# Patient Record
Sex: Male | Born: 1945
Health system: Southern US, Community
[De-identification: ages and names within clinical notes are randomized; demographics above are authoritative.]

## PROBLEM LIST (undated history)

## (undated) DIAGNOSIS — G4733 Obstructive sleep apnea (adult) (pediatric): Secondary | ICD-10-CM

## (undated) DIAGNOSIS — I739 Peripheral vascular disease, unspecified: Secondary | ICD-10-CM

## (undated) DIAGNOSIS — E119 Type 2 diabetes mellitus without complications: Secondary | ICD-10-CM

## (undated) DIAGNOSIS — I2510 Atherosclerotic heart disease of native coronary artery without angina pectoris: Secondary | ICD-10-CM

## (undated) DIAGNOSIS — C801 Malignant (primary) neoplasm, unspecified: Secondary | ICD-10-CM

## (undated) DIAGNOSIS — I701 Atherosclerosis of renal artery: Secondary | ICD-10-CM

## (undated) DIAGNOSIS — I219 Acute myocardial infarction, unspecified: Secondary | ICD-10-CM

## (undated) DIAGNOSIS — Z87442 Personal history of urinary calculi: Secondary | ICD-10-CM

## (undated) DIAGNOSIS — R7303 Prediabetes: Secondary | ICD-10-CM

## (undated) DIAGNOSIS — I484 Atypical atrial flutter: Secondary | ICD-10-CM

## (undated) DIAGNOSIS — I4819 Other persistent atrial fibrillation: Secondary | ICD-10-CM

## (undated) DIAGNOSIS — I499 Cardiac arrhythmia, unspecified: Secondary | ICD-10-CM

## (undated) DIAGNOSIS — Z951 Presence of aortocoronary bypass graft: Secondary | ICD-10-CM

## (undated) DIAGNOSIS — I519 Heart disease, unspecified: Secondary | ICD-10-CM

## (undated) DIAGNOSIS — E785 Hyperlipidemia, unspecified: Secondary | ICD-10-CM

## (undated) DIAGNOSIS — I10 Essential (primary) hypertension: Secondary | ICD-10-CM

## (undated) DIAGNOSIS — J449 Chronic obstructive pulmonary disease, unspecified: Secondary | ICD-10-CM

## (undated) HISTORY — DX: Essential (primary) hypertension: I10

## (undated) HISTORY — DX: Obstructive sleep apnea (adult) (pediatric): G47.33

## (undated) HISTORY — PX: BACK SURGERY: SHX140

## (undated) HISTORY — DX: Hyperlipidemia, unspecified: E78.5

## (undated) HISTORY — DX: Atherosclerotic heart disease of native coronary artery without angina pectoris: I25.10

## (undated) HISTORY — DX: Atherosclerosis of renal artery: I70.1

## (undated) HISTORY — DX: Peripheral vascular disease, unspecified: I73.9

## (undated) HISTORY — DX: Atypical atrial flutter: I48.4

## (undated) HISTORY — DX: Presence of aortocoronary bypass graft: Z95.1

## (undated) HISTORY — DX: Other persistent atrial fibrillation: I48.19

## (undated) HISTORY — DX: Heart disease, unspecified: I51.9

## (undated) HISTORY — PX: ATRIAL FIBRILLATION ABLATION: SHX5732

---

## 1979-09-02 HISTORY — PX: ORCHIECTOMY: SHX2116

## 1996-09-01 HISTORY — PX: CORONARY ARTERY BYPASS GRAFT: SHX141

## 1996-11-16 DIAGNOSIS — Z951 Presence of aortocoronary bypass graft: Secondary | ICD-10-CM

## 1996-11-16 HISTORY — DX: Presence of aortocoronary bypass graft: Z95.1

## 1997-10-26 ENCOUNTER — Ambulatory Visit: Admission: RE | Admit: 1997-10-26 | Discharge: 1997-10-27 | Payer: Self-pay | Admitting: Cardiovascular Disease

## 1998-07-19 ENCOUNTER — Observation Stay (HOSPITAL_COMMUNITY): Admission: RE | Admit: 1998-07-19 | Discharge: 1998-07-20 | Payer: Self-pay | Admitting: Cardiovascular Disease

## 1998-07-19 ENCOUNTER — Encounter: Payer: Self-pay | Admitting: Cardiovascular Disease

## 1999-09-05 ENCOUNTER — Encounter: Payer: Self-pay | Admitting: Cardiovascular Disease

## 1999-09-05 ENCOUNTER — Observation Stay (HOSPITAL_COMMUNITY): Admission: RE | Admit: 1999-09-05 | Discharge: 1999-09-06 | Payer: Self-pay | Admitting: Cardiovascular Disease

## 2001-10-18 ENCOUNTER — Encounter: Payer: Self-pay | Admitting: Cardiovascular Disease

## 2001-10-18 ENCOUNTER — Ambulatory Visit (HOSPITAL_COMMUNITY): Admission: RE | Admit: 2001-10-18 | Discharge: 2001-10-18 | Payer: Self-pay | Admitting: Cardiovascular Disease

## 2002-01-20 ENCOUNTER — Ambulatory Visit (HOSPITAL_COMMUNITY): Admission: RE | Admit: 2002-01-20 | Discharge: 2002-01-20 | Payer: Self-pay | Admitting: Gastroenterology

## 2003-04-07 ENCOUNTER — Ambulatory Visit (HOSPITAL_COMMUNITY): Admission: RE | Admit: 2003-04-07 | Discharge: 2003-04-08 | Payer: Self-pay | Admitting: Orthopaedic Surgery

## 2005-09-01 HISTORY — PX: BACK SURGERY: SHX140

## 2005-10-03 ENCOUNTER — Encounter: Admission: RE | Admit: 2005-10-03 | Discharge: 2005-10-03 | Payer: Self-pay | Admitting: Orthopaedic Surgery

## 2005-10-24 ENCOUNTER — Encounter: Admission: RE | Admit: 2005-10-24 | Discharge: 2005-10-24 | Payer: Self-pay | Admitting: Orthopaedic Surgery

## 2005-12-22 ENCOUNTER — Ambulatory Visit (HOSPITAL_COMMUNITY): Admission: RE | Admit: 2005-12-22 | Discharge: 2005-12-23 | Payer: Self-pay | Admitting: Orthopaedic Surgery

## 2006-03-23 ENCOUNTER — Ambulatory Visit (HOSPITAL_COMMUNITY): Admission: RE | Admit: 2006-03-23 | Discharge: 2006-03-24 | Payer: Self-pay | Admitting: Cardiovascular Disease

## 2008-01-25 ENCOUNTER — Inpatient Hospital Stay (HOSPITAL_COMMUNITY): Admission: EM | Admit: 2008-01-25 | Discharge: 2008-01-27 | Payer: Self-pay | Admitting: Emergency Medicine

## 2008-01-27 ENCOUNTER — Encounter (INDEPENDENT_AMBULATORY_CARE_PROVIDER_SITE_OTHER): Payer: Self-pay | Admitting: Cardiovascular Disease

## 2008-03-08 ENCOUNTER — Ambulatory Visit (HOSPITAL_COMMUNITY): Admission: RE | Admit: 2008-03-08 | Discharge: 2008-03-08 | Payer: Self-pay | Admitting: Cardiovascular Disease

## 2008-09-18 ENCOUNTER — Encounter: Admission: RE | Admit: 2008-09-18 | Discharge: 2008-09-18 | Payer: Self-pay | Admitting: Cardiovascular Disease

## 2008-09-22 ENCOUNTER — Ambulatory Visit (HOSPITAL_COMMUNITY): Admission: RE | Admit: 2008-09-22 | Discharge: 2008-09-22 | Payer: Self-pay | Admitting: Cardiovascular Disease

## 2008-09-22 ENCOUNTER — Encounter: Payer: Self-pay | Admitting: Cardiovascular Disease

## 2009-11-21 HISTORY — PX: OTHER SURGICAL HISTORY: SHX169

## 2010-01-21 LAB — PSA: PSA: 0.9

## 2010-01-21 LAB — CBC AND DIFFERENTIAL: Hemoglobin: 10.6 g/dL — AB (ref 13.5–17.5)

## 2010-02-01 ENCOUNTER — Ambulatory Visit (HOSPITAL_COMMUNITY): Admission: RE | Admit: 2010-02-01 | Discharge: 2010-02-01 | Payer: Self-pay | Admitting: Cardiovascular Disease

## 2010-03-25 HISTORY — PX: OTHER SURGICAL HISTORY: SHX169

## 2010-04-18 LAB — BASIC METABOLIC PANEL
Creatinine: 1.2 mg/dL (ref 0.6–1.3)
Potassium: 3.9 mmol/L (ref 3.4–5.3)
Sodium: 142 mmol/L (ref 137–147)

## 2010-04-18 LAB — CBC AND DIFFERENTIAL: Hemoglobin: 9.4 g/dL — AB (ref 13.5–17.5)

## 2010-04-18 LAB — HEMOGLOBIN A1C: Hemoglobin A1C: 6.3

## 2010-04-18 LAB — FERRITIN: Hemoglobin A1C: 6.3

## 2010-04-18 LAB — IRON AND TIBC: Iron Bind.Cap.(Total): 420

## 2010-04-18 LAB — CHG IRON BINDING TEST: Ferritin: 19

## 2010-04-18 LAB — IRON

## 2010-07-18 LAB — LIPID PANEL
Cholesterol: 83 mg/dL (ref 0–200)
HDL: 34 mg/dL — AB (ref 35–70)
Triglycerides: 56 mg/dL (ref 40–160)

## 2010-11-18 LAB — GLUCOSE, CAPILLARY: Glucose-Capillary: 130 mg/dL — ABNORMAL HIGH (ref 70–99)

## 2010-11-18 LAB — PROTIME-INR
INR: 2.58 — ABNORMAL HIGH (ref 0.00–1.49)
Prothrombin Time: 27.5 seconds — ABNORMAL HIGH (ref 11.6–15.2)

## 2011-01-14 NOTE — H&P (Signed)
NAMEJAVARIAN, [name]               ACCOUNT NO.:  [id number]   MEDICAL RECORD NO.:  [id number]            PATIENT TYPE:   LOCATION:                                 FACILITY:   PHYSICIAN:  Richard A. Alanda Amass, M.D.DATE OF BIRTH:  04/14/1946   DATE OF ADMISSION:  01/25/2008  DATE OF DISCHARGE:                              HISTORY & PHYSICAL   CHIEF COMPLAINT:  Palpitations and throat discomfort and left arm  discomfort.   HISTORY OF PRESENT ILLNESS:  [name] is a 65 year old male who had  bypass surgery x7 in March of 1998.  He is followed by Dr. Allyson Sabal.  He  had a catheterization in 2003 that showed patent grafts.  His last  Myoview was in February of 2009 with low risk, although he did have some  suggestion of ischemia.  He also has peripheral vascular disease and has  had previous right renal artery stenting in 1999 and bilateral iliac  artery stenting in February of 1999 and July of 2007.  He had done well  from a cardiac standpoint.  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  He has not had atrial fibrillation or tachycardia in the past.  Yesterday he said he woke up at 2:00 a.m. from a dream and noted  shoulder discomfort, vague shortness of breath, palpitations,  tachycardia, and some throat discomfort.  He got up and sat down to  drink some water, and his symptoms improved but have not resolved.  He  was seen in the office today for further evaluation.  His EKG today in  the office shows atrial flutter with an overall ventricular response of  100.  He denies any recent exertional chest pain or angina.  As noted,  during his episode he did have some shoulder discomfort and throat pain.  He did take one nitroglycerin that he had, and his symptoms of throat  pain and left shoulder pain resolved.   PAST MEDICAL HISTORY:  1. Hypertension.  2. Treated dyslipidemia.  3. Peripheral vascular disease, as described above.  4. Prior right bundle branch block.   CURRENT  MEDICATIONS:  1. Lipitor 20 mg nightly.  2. Metoprolol 50 mg b.i.d.  3. Aspirin 325 mg a day.  4. Lisinopril/hydrochlorothiazide 12.5/10 mg a day.  5. Plavix 75 mg a day.   ALLERGIES:  NO KNOWN DRUG ALLERGIES.   SOCIAL HISTORY:  He is divorced.  He has two children and one  grandchild.  Recently he was laid off and forced into retirement from [employer].  He quit smoking in 1996.  He has 1-2 glasses of wine a  day.   FAMILY HISTORY:  Unremarkable for coronary disease.  His father did have  a bypass, but he was in his mid-70s.  He has a brother without coronary  disease.  His mother died in her 37s of AML.   REVIEW OF SYSTEMS:  Essentially unremarkable, except for noted above.  He has not had GI bleeding or melena.  He has no history of prostate  problems or dysuria or hematuria.  He  has no history of kidney stones.  He has no history of diabetes.  He has not had recent fever or chills.   PHYSICAL EXAMINATION:  VITAL SIGNS:  Blood pressure 126/96, pulse 104,  weight 224, respirations 12.  GENERAL:  He is well-developed, well-nourished male in no acute  distress.  HEENT:  Normocephalic, atraumatic.  Extraocular motions intact.  Sclerae  is anicteric.  Lids and conjunctivae within normal limits.  NECK:  Without JVD and without bruit.  CHEST:  Clear to auscultation and percussion.  CARDIAC:  Reveals regular rate and rhythm without obvious murmur or rub.  His rate is increased.  ABDOMEN:  Nontender.  No hepatosplenomegaly, no bruits.  EXTREMITIES:  Some rubor.  Posterior tibial pulses are faint.  Femoral  artery pulses are somewhat diminished.  There are no obvious bruits.  NEUROLOGIC:  Exam grossly intact.  He is awake, alert, oriented, and  cooperative.  He moves all extremities without obvious deficit.  SKIN:  Warm and dry.   EKG shows atrial flutter with 2:1 conduction and a right bundle branch  block.   IMPRESSION:  1. New onset 2:1 atrial flutter.  2. Throat  discomfort and arm pain, rule out progression of coronary      disease.  3. History of coronary artery bypass grafting in March of 1998 with      catheterization in 2003 revealing patent grafts, low-risk Myoview      in February of 2009.  4. Normal left ventricular function.  5. Peripheral vascular disease with history of right renal artery      stenting in 1999, bilateral iliac artery stenting in February of      1999 and again in July of 2007.  He does have some moderate disease      by Dopplers in February of 2009.  6. Treated hypertension.  7. Treated dyslipidemia.  8. Right bundle branch block.   PLAN:  The patient was seen by Dr. Alanda Amass and myself today in the  office.  He will be admitted to [facility], started on IV heparin and p.o.  diltiazem.  He will need to be restudied tomorrow for further treatment  of his atrial flutter.      Abelino Derrick, P.A.      Richard A. Alanda Amass, M.D.  Electronically Signed    LKK/MEDQ  D:  01/25/2008  T:  01/25/2008  Job:  [id number]

## 2011-01-14 NOTE — Cardiovascular Report (Signed)
[name], [name]              ACCOUNT NO.:  [id number]   MEDICAL RECORD NO.:  [id number]          PATIENT TYPE:  OIB   LOCATION:  2899                         FACILITY:  [facility]   PHYSICIAN:  Nanetta Batty, M.D.   DATE OF BIRTH:  [birth year]   DATE OF PROCEDURE:  03/08/2008  DATE OF DISCHARGE:  03/08/2008                            CARDIAC CATHETERIZATION   [name] is a 65 year old gentleman with history of CAD status post  coronary artery bypass grafting x7 on November 16, 1996.  He also had renal  artery PTA and stenting in 1999 and bilateral iliac arteries PTA and  stenting as well.  His other problems include  hypertension and  hyperlipidemia.  He has been in a flutter for the last several months  and has undergone 2-D echocardiography and Myoview stress testing all of  which were unremarkable.  He has been on Coumadin anticoagulation, who  presents now for elective outpatient DC cardioversion.   DESCRIPTION OF PROCEDURE:  The patient brought to the outpatient area  and seen in the postabsorptive state.  His other labs were reviewed and  were all normal with INR of 2.8.  AP pads were administered.  The  patient received 175 mg of IV Pentothal resulting in adequate general  anesthesia.  He had 1 shock using 120 joules biphasic resulting in  restoration of sinus rhythm.  He tolerated the procedure well and woke  up without sequelae.   IMPRESSION:  Successful outpatient DC cardioversion from atrial flutter  to sinus rhythm using 1 biphasic 120 joules shock.  The patient will be  discharged home in 2 hours.  We will see him back in the office in the  next week or so.      Nanetta Batty, M.D.  Electronically Signed     JB/MEDQ  D:  03/08/2008  T:  03/09/2008  Job:  [id number]   cc:   [facility]

## 2011-01-14 NOTE — Cardiovascular Report (Signed)
[name], [name]              ACCOUNT NO.:  [id number]   MEDICAL RECORD NO.:  [id number]          PATIENT TYPE:  INP   LOCATION:  3729                         FACILITY:  [facility]   PHYSICIAN:  Thereasa Solo. Little, M.D. DATE OF BIRTH:  Aug 26, 1946   DATE OF PROCEDURE:  01/26/2008  DATE OF DISCHARGE:                            CARDIAC CATHETERIZATION   INDICATIONS FOR TEST:  This 65 year old male had bypass surgery in 1998.  He had a cath in 2007, that showed his LV function to be normal and all  of his grafts to be patent with the exception of the distal limb of the  sequential saphenous vein graft that ran from an intermediate to an OM  and this was occluded.   He presented to office yesterday with new onset atrial fib, flutter,  chest pain, and shortness of breath.  He has low positive troponins and  a negative CK-MB.   PROCEDURE:  After obtaining informed consent, the patient was prepped  and draped in the usual sterile fashion exposing the right groin.  Following local anesthetic with 1% Xylocaine, the Seldinger technique  was employed, and a 5-French introducer sheath was placed in the right  femoral artery.  A Glidewire and a right coronary catheter was used to  navigate the iliacs and the distal aorta.  Following this graft  visualization, right and left coronary arteriography, ventriculography  in the RAO projection, and distal aortogram at the level of renal  arteries was performed.   COMPLICATIONS:  None.   TOTAL CONTRAST:  120 mL.   EQUIPMENT:  5-French Judkins configuration catheters and all grafts were  cannulated with a right coronary catheter.   RESULTS:  1. Hemodynamic monitoring.  Central aortic pressure was 104/68, left      ventricular pressure was 102/9, and the left ventricular end-      diastolic pressure was 12.  2. Ventriculography.  Ventriculography in the RAO projection done at      the end of the procedure showed the apex and posterior basilar  segments to be akinetic.  The remainder of the segments were normal      to slightly hyperkinetic and was in the ejection fraction of 40%      plus 1 mitral regurgitation and end-diastolic pressure of 12.  3. Distal aortogram.  Distal aortogram using 25 mL of 15 mL per second      showed both the renal artery stents to be widely patent.  There was      marked infrarenal aortic irregularities and there was mild iliac      disease bilaterally but both iliac stents were widely patent.   CORONARY ARTERIOGRAPHY:  1. Left main normal.  2. Circumflex.  The circumflex was 100% occluded in its proximal      segment right at the takeoff of a small OM in the ongoing      circumflex.  There was collateral visualization of the OMs from      left-to-left and from right-to-left circulation.  3. Intermediate.  The intermediate was a small vessel.  There was 100%  occluded proximally.  4. LAD.  The LAD had a proximal eccentric 75% area in the proximal      portion of the LAD before the diagonal.  The ongoing LAD and the      diagonal themselves were both free of disease and both were      grafted.  5. Right coronary artery 100% occluded proximally.   GRAFTS:  1. Saphenous vein graft sequentially to the intervention and OM is      100% occluded.  2. Saphenous vein to the acute margin PDA and posterolateral vessel.      The graft was widely patent.  The acute marginal branch, the PDA      and the posterolateral vessels were well visualized and free of      disease and there was faint collateral filling of the OM system.  3. Internal mammary artery sequentially to the diagonal #1 and      diagonal #2 (this looks more to me like to the LAD diagonal but it      is listed as sequential to two diagonals).  The graft is widely      patent.  The diagonals are widely patent and there is collateral      filling of the OM vessel through this also.   CONCLUSION:  1. Ejection fraction 40%  with wall motion  abnormalities of both the      apex and posterior basilar segments (this is new).  2. Mitral regurgitation +1.  3. Patent renal artery and iliac stents.  4. Loss of the saphenous vein graft to the intermediate and OM.  5. Remainder of the grafts were patent.   DISCUSSION:  He will need medical therapy only for loss of the saphenous  vein graft to the OM and intermediate.  This vascular system is  relatively small.  It is fed by collaterals and is not amenable to any  type of percutaneous intervention.   The decreased ejection fraction is new, may be related to his rapid  atrial fibrillation, which he had on admission and will probably need to  be repeated by echo in about 3-4 months.  He is already on lisinopril 10  mg a day.   He still in atrial flutter.  His rate is now about 100.  He is on  Lopressor 50 b.i.d. and Cardizem 60 mg q.8 h.  I will increase his  Cardizem to 90 mg q.8 h. and watch his blood pressure to make sure it  does not become too low with the above-mentioned medicines.   I plan to restart his heparin in approximately 4 hours and will start  Coumadin load per pharmacy.  I stopped his aspirin and will continue him  on Plavix along with the Coumadin long term.   He should have an echocardiogram order.  We will make sure there is no  other structural abnormalities, particularly at the left atrium as a  reason for the atrial fib.           ______________________________  Thereasa Solo. Little, M.D.     ABL/MEDQ  D:  01/26/2008  T:  01/27/2008  Job:  [id number]   cc:   Rickard Patience, P.A.  Nanetta Batty, M.D.

## 2011-01-14 NOTE — H&P (Signed)
[name], [name]              ACCOUNT NO.:  [id number]   MEDICAL RECORD NO.:  [id number]          PATIENT TYPE:  OIB   LOCATION:  2899                         FACILITY:  [facility]   PHYSICIAN:  [name], M.D.   DATE OF BIRTH:  08-06-1946   DATE OF ADMISSION:  03/08/2008  DATE OF DISCHARGE:                              HISTORY & PHYSICAL   HISTORY OF PRESENT ILLNESS:  [name] is a 65 year old male with a  history of coronary disease.  He had bypass surgery in March 1998.  He  had patent grafts in 2003.  He had a low-risk Myoview in February 2009.  He presented in May 2009 with dyspnea on exertion and was found to be in  atrial flutter.  He was admitted for diagnostic catheterization, which  was done May 27 by Dr. Clarene Hogan.  He did have a new occlusion to the SVG  to OM, but otherwise his grafts were okay and he was treated medically.  His EF was 40%.  He remained in atrial fibrillation and atrial flutter  during his hospitalization.  He was put on Coumadin and discharged.  He  was seen by Dr. Allyson Hogan in the office a couple of weeks ago and is set up  now for elective outpatient cardioversion.  Since we have seen him in  the office, he has had no changes.  He denies any fever or chills or  other unusual symptoms or hospital visits.   His past medical history is remarkable for treated hypertension, treated  dyslipidemia, peripheral vascular disease with prior right renal artery  PTA in 1999 with bilateral iliac PTA in 1999.  He has gastroesophageal  reflux.   His current medications are Diltiazem 300 mg a day, Coumadin 5 mg a day  or as directed, Plavix 75 mg a day, lisinopril 10/12.5 daily, metoprolol  50 mg twice a day, Lipitor 20 mg h.s.   He has no known drug allergies.   SOCIAL HISTORY:  He is divorced.  He has 2 children, 1 grandchild.  He  does exercise 2  to 3 times a week.  He quit smoking in 1996.   Family history is unremarkable.   Review of systems essentially  unremarkable except for noted above.   PHYSICAL EXAMINATION:  VITAL SIGNS:  Blood pressure 130/84, pulse 90,  temperature 98.5.  GENERAL:  He is a well-developed, well-nourished male in no acute  distress.  HEENT:  Normocephalic.  Extraocular movements are intact.  Sclerae are  nonicteric.  He wears glasses.  NECK:  Without JVD or bruit.  CHEST:  Clear to auscultation and percussion.  CARDIAC:  Reveals a regular rate and rhythm without obvious murmur.  ABDOMEN:  Nontender.  No hepatosplenomegaly.  EXTREMITIES:  Without edema.  Distal pulses are intact.  NEUROLOGIC:  Grossly intact.  He is awake, alert and oriented,  cooperative.  Moves all extremities without obvious deficit.  SKIN:  Warm, dry.   EKG revealed atrial flutter with a ventricular response of about 100.   IMPRESSION:  1. Atrial fibrillation/atrial flutter.  2. Coronary disease, prior coronary artery bypass grafting  with one      graft occlusion in May 2009.  3. Moderate left ventricular dysfunction with an ejection fraction of      40% at catheterization May 2009.  4. Treated hypertension.  5. Treated dyslipidemia.   PLAN:  The patient is admitted for outpatient DC cardioversion.      [name], P.A.      [name], M.D.  Electronically Signed    LKK/MEDQ  D:  03/08/2008  T:  03/08/2008  Job:  [id number]

## 2011-01-14 NOTE — Discharge Summary (Signed)
NAMEGUNNISON, CHAHAL              ACCOUNT NO.:  [id number]   MEDICAL RECORD NO.:  [id number]          PATIENT TYPE:  INP   LOCATION:  3729                         FACILITY:  [facility]   PHYSICIAN:  Abelino Derrick, P.A.   DATE OF BIRTH:  05-Sep-1945   DATE OF ADMISSION:  01/25/2008  DATE OF DISCHARGE:  01/27/2008                               DISCHARGE SUMMARY   ADDENDUM   Mr. Limbert was also sent on diltiazem 240 mg 1 today.      Abelino Derrick, P.ALenard Lance  D:  01/27/2008  T:  01/28/2008  Job:  [id number]

## 2011-01-14 NOTE — Discharge Summary (Signed)
[name], [name]              ACCOUNT NO.:  [id number]   MEDICAL RECORD NO.:  [id number]          PATIENT TYPE:  INP   LOCATION:  3729                         FACILITY:  [facility]   PHYSICIAN:  Nanetta Batty, M.D.   DATE OF BIRTH:  1946/06/14   DATE OF ADMISSION:  01/25/2008  DATE OF DISCHARGE:  01/27/2008                               DISCHARGE SUMMARY   DISCHARGE DIAGNOSES:  1. New onset atrial flutter.  2. Unstable angina, catheterization this admission revealing occlusion      of the saphenous vein graft to intermediate obtuse marginal, to be      treated medically.  3. Coronary artery bypass grafting in 1998.  4. Moderate left ventricular dysfunction with an ejection fraction of      40% when in atrial fibrillation this admission.  5. Treated hypertension  6. Treated dyslipidemia.  7. Peripheral vascular disease with prior right renal artery      percutaneous transluminal angioplasty in 1999 and bilateral iliac      artery percutaneous transluminal angioplasty in 1999.  8. Coumadin therapy, new this admission.   HOSPITAL COURSE:  The patient is a 65 year old male followed by Dr.  Allyson Sabal with a history of coronary artery bypass grafting in March 1998.  He was cathed in 2003 and had patent grafts.  He had a Myoview in  February 2009 that was at low risk.  He presented to the office with  complaints of shortness of breath and shoulder and throat discomfort.  This apparently woke him up.  In the office, he was in atrial flutter  with a ventricular response of 100.  He was admitted to telemetry,  started on IV heparin and set up for diagnostic catheterization.  This  was done on Jan 26, 2008 by Dr. Clarene Duke.  The vein graft to the  intermediate and OM was occluded which was new.  The LIMA to the first  and second diagonal was patent and gave some collaterals to the OM.  The  SVG to the acute marginal and PDA was patent.  LV was 40%, which is new.  It should be note that the  patient was in atrial fib.  The patient was  put on heparin and Coumadin postprocedure, and we have changed him over  to Lovenox to Coumadin at discharge.  Echocardiogram was done and the  results are pending.  His troponins were slightly positive at 0.14 and  0.15, but his CK-MBs were negative.   DISCHARGE MEDICATIONS:  1. Coumadin 7.5 mg a day or as directed.  2. Metoprolol 50 mg b.i.d.  3. Lisinopril hydrochlorothiazide 10/12.5 daily.  4. Plavix 75 mg a day.  5. Lipitor 20 mg a day.  6. Prilosec 20 mg a day.  7. Lovenox 100 mg subcu b.i.d. for 5 doses.   LABS:  White count 10.3, hemoglobin 16.9, hematocrit 40.5, and platelets  197,000.  Sodium 134, potassium 4.3, BUN 10, and creatinine 1.18.  LFTs  were normal.  LDL 77.  CK-MBs were negative.  Troponin is 0.15 and 0.14.  TSH is pending.  Chest x-ray shows no active  process.  Urinalysis  unremarkable.  INR at discharge is 1.0.  He has received two doses of 10  mg of Coumadin.  EKG reveals atrial fibrillation with controlled  ventricular response, one EKG appeared to be sinus rhythm, but in  retrospect was probably atrial fibrillation with right bundle.   DISPOSITION:  The patient was discharged in stable condition and will  follow up with Dr. Allyson Sabal as an outpatient.  He will have a protime on  Monday.      [name], P.A.      Nanetta Batty, M.D.  Electronically Signed    LKK/MEDQ  D:  01/27/2008  T:  01/28/2008  Job:  [id number]   cc:   Nanetta Batty, M.D.

## 2011-01-17 NOTE — Op Note (Signed)
[name], [name]                        ACCOUNT NO.:  [id number]   MEDICAL RECORD NO.:  [id number]                   PATIENT TYPE:  OIB   LOCATION:  2550                                 FACILITY:  [facility]   PHYSICIAN:  [name], M.D.                 DATE OF BIRTH:  06-Jul-1946   DATE OF PROCEDURE:  04/07/2003  DATE OF DISCHARGE:                                 OPERATIVE REPORT   PREOPERATIVE DIAGNOSIS:  Bilateral L3-L4 foraminal stenosis, left L4-L5  foraminal stenosis.   POSTOPERATIVE DIAGNOSIS:  Bilateral L3-L4 foraminal stenosis, left L4-L5  foraminal stenosis.   PROCEDURE:  Left L4 hemilaminectomy, left L3-L4 and L4-L5 foraminotomies,  right L3 and L4 laminotomy with L3-L4 foraminotomy.   SURGEON:  [name], M.D.   ANESTHESIA:  GET.   ESTIMATED BLOOD LOSS:  100 mL.   BRIEF HISTORY:  This 65 year old male has had persistent nerve root pain  with foraminal stenosis, worse on the left than right, and has mild to  moderate central stenosis at 3-4 and mild at L4-L5.   PROCEDURE:  After induction of general anesthesia, the patient was placed  chest rolls due to his arterial lower extremity stents.  The back was  prepped with DuraPrep.  Preoperative Ancef was given prophylactically.  The  area was covered with towels, sterile Vi-Drape applied and laminectomy sheet  and drapes.  An incision was made at the midline after needle localization  with the need at 3-4 and 4-5.  Two needles on the x-rays were at the  inferior aspect of the pedicle at 5 and at the pedicle at 3.  An incision  was made and the initial approach was on the left side since that was the  more symptomatic side.  A complete L4 hemilaminectomy was performed with  removal of the hypertrophic ligamentum.  A partial L3 laminotomy was  performed on the left and the top portion of L5.  Lateral gutter showed  large amounts of thickened ligamentum.  The bone was removed out to the  level of the pedicle.   The spinous process was preserved.  The nerve root  was examined, the foramina was enlarged.  3-4 and 4-5 discs were inspected  but no microdiscectomy was performed.  There was some hard protrusion of the  disc but disc herniation was not a component of the patient's nerve root  compression.  Once the nerve root was free and a hockey stick could be  placed up the foramina the L3 and L4 nerve with good freedom, the operative  field was irrigated.  The operative microscope that had been draped was  moved to the other side and on the right side at L3, foraminotomy was  performed.  There was not as much significant facet overhang on the right as  on the left side.  There were large chunks of ligaments that was causing  displacement  of the nerve root but not as severe and once the foramina was  enlarged, the nerve root was inspected in the axilla and along the shoulder  and bone was removed all the way out to the level of the pedicle.  Palpation  with the hockey stick was performed distally, there was good epidural fat.  By MRI scan, the L4-L5 foramina on the right was not as severe and did not  require foraminotomy.  Saline irrigation was performed and the fascia was  closed with 0 Vicryl, 2-0 Vicryl subcutaneous tissue, and skin staple  closure, and postop dressing.  Instrument count and needle counts were  correct.                                              [name], M.D.   MCY/MEDQ  D:  04/07/2003  T:  04/07/2003  Job:  [id number]

## 2011-01-17 NOTE — Cardiovascular Report (Signed)
[name], [name]              ACCOUNT NO.:  [id number]   MEDICAL RECORD NO.:  [id number]          PATIENT TYPE:  OIB   LOCATION:  2807                         FACILITY:  [facility]   PHYSICIAN:  Nanetta Batty, M.D.   DATE OF BIRTH:  1946-01-08   DATE OF PROCEDURE:  03/23/2006  DATE OF DISCHARGE:                              CARDIAC CATHETERIZATION   CLINICAL HISTORY:  [name] is a 65 year old white male with history of  CAD status post bypass grafting x7 November 16, 1996.  The other problems  include PVOD status post right renal artery PTA and stenting as well as  bilateral iliac artery PTA and stenting.  His other problems include  dyslipidemia and hypertension.   He has had progressive claudication and Dopplers suggested progression of  disease in his iliac arteries.  He presents now for angiography and  potential intervention.   PROCEDURE DESCRIPTION:  Patient was brought to the second floor PV  Angiographic Suite in the postabsorptive state.  He was pre medicated with 2  mg of Versed and 50 mcg of fentanyl.  His right and left groins were prepped  and shaved in the usual sterile fashion.  1% Xylocaine was used for local  anesthesia.  A 5-French sheath was inserted into the left femoral artery  using standard Seldinger technique.  A 7-French sheath was inserted into the  right femoral artery.  5-French tennis racquet, crossover, and then Hulka  catheters were used for mid stream and distal aortography with bifemoral  runoff.  Visipaque dye was used for the entirety of the case.  Retrograde  aortic pressure was monitored during the case.   ANGIOGRAPHIC RESULTS:  1.  Abdominal aorta:      1.  Renal arteries:  Widely patent right renal artery stent.      2.  Infrarenal abdominal aorta:  Moderate atherosclerotic changes.  2.  Left lower extremity:      1.  50% in-stent restenosis within the left common iliac artery stent.          There was a 90% fairly focal lesion in the left  common femoral          artery.  There was three vessel runoff.  3.  Right lower extremity:      1.  There was moderate dilatation of the right common iliac artery with          approximately 50% web-like lesion and a 20 mm gradient.      2.  Patent right common iliac artery stent.      3.  70% lesion at the proximal edge of the right iliac stent with a 20          mm gradient.      4.  Normal SFA with three vessel runoff.   PROCEDURE DESCRIPTION:  The patient received 3500 units of heparin  intravenously.  Contralateral access was obtained from the right to the left  common femoral artery using a crossover catheter, 0.35 Wholey wire, and a 7-  [place] long break tip Cordis sheath.  The 5-French sheath in the  left common  femoral was then withdrawn halfway and PTA was performed of the common  femoral using a 5 x 6 Powerflex.  Stenting was performed using a 7 x 3 Smart  stent post dilatation with a 6 x 2 Powerflex both at the common femoral as  well as in the common iliac artery stent.   The long 7-French sheath was then withdrawn back across the iliac  bifurcation into the right iliac artery and PTA was performed on the right  common iliac artery using a 10 x 2 Powerflex.  There was a waste noted.  We  did not have the appropriate sized stent to stent this.  Stenting was  performed of the proximal edge of the right iliac stent with an 8 x 18  Genesis __________ pre mount resulting in reduction of a 70% band-like  lesion to 0% residual.   OVERALL IMPRESSION:  Successful PTA and stenting of the left common femoral  artery, PTA of the left common iliac artery for in-stent restenosis within  a previously stented segment, PTA of the right common iliac artery, and  stenting of the right external iliac artery for functional limiting  claudication.  Patient tolerated procedure well.  ACT was measured and the  sheaths were removed.  Pressure was held on the groin to achieve hemostasis.   Patient left the laboratory in stable condition.  Patient will be hydrated  overnight, discharged home in the morning.  He will get follow-up Dopplers  and ABIs.  Afterwards he will see me back in the office.  He left the  laboratory in stable condition.      Nanetta Batty, M.D.  Electronically Signed     JB/MEDQ  D:  03/23/2006  T:  03/23/2006  Job:  [id number]   cc:   Peripheral Vascular Angiographic Suite   [facility]  [address]  [place], [place] [address]   Olene Craven, M.D.  Fax: [phone]

## 2011-01-17 NOTE — Discharge Summary (Signed)
NAMEIVORY, [name]              ACCOUNT NO.:  [id number]   MEDICAL RECORD NO.:  [id number]          PATIENT TYPE:  OIB   LOCATION:  3701                         FACILITY:  [facility]   PHYSICIAN:  Raymon Mutton, P.A. DATE OF BIRTH:  08-05-1946   DATE OF ADMISSION:  03/23/2006  DATE OF DISCHARGE:  03/24/2006                                 DISCHARGE SUMMARY   DISCHARGE DIAGNOSES:  1.  Claudication, greatest on the left, status post abnormal perivascular      ultrasound.  2.  Status post PV angiogram during this admission with intervention to      bilateral lower extremities.  3.  Known coronary artery disease, status post coronary artery bypass      grafting.  4.  Hypertension.  5.  Hyperlipidemia.   HOSPITAL PROCEDURES:  PV angiogram performed by Dr. Allyson Sabal on March 23, 2006  and it revealed widely patent renal artery stent in the renal abdominal  aorta, mildly sclerotic, 50% in stent restenosis of the left common iliac  artery, and 90% fairly focal lesion in the left common femoral artery.  Right lower extremity revealed 50% web-like lesion in the right common iliac  artery, and 70% at the proximal edge of the previous stented right iliac  artery.  Dr. Allyson Sabal performed a successful TT and stenting of the left common  femoral artery, TT of the left common iliac artery for in-stent restenosis  within the previously stented segment, TT of the right common iliac, and  stenting of the right external iliac for function limiting claudications.  The patient tolerated the procedure well.   HOSPITAL COURSE:  This is a 65 year old gentleman patient of Dr. Allyson Sabal who  was seen in the office with complaints of function limiting claudications.  He underwent lower extremity Dopplers that revealed abnormal ABIs and  abnormal velocities and was scheduled for PV angio on March 23, 2006.  He was  admitted to the short stay unit and underwent the procedure the same day.  For full description of the  procedure, please refer to the dictated report  by Dr. Allyson Sabal.  The patient tolerated the procedure well.  The next morning  he was assessed by Dr. Allyson Sabal.  His groin site was stable.  Vital signs were  within normal limits.  He was discharged home in stable condition.   DISCHARGE MEDICATIONS:  1.  Aspirin 325 mg daily.  2.  Plavix 75 mg daily.  3.  Lipitor 20 q.h.s.  4.  Lopressor 50 mg daily.  5.  Lisinopril 12.5/10 mg daily.   DISCHARGE DIET:  Low fat, low cholesterol diet.   DISCHARGE ACTIVITIES:  No driving or lifting greater than 5 pounds for 3  days.  No strenuous activities.  The patient was instructed to report to our  office any problems with groin site.  Number was provided.  Lower extremity  ultrasound was scheduled on March 27, 2006 and 8:30 and follow up appointment  with Dr. Allyson Sabal on April 02, 2006 at 12:15.      Raymon Mutton, P.A.     MK/MEDQ  D:  03/24/2006  T:  03/24/2006  Job:  [id number]   cc:   Nanetta Batty, M.D.  Fax: [phone]   [facility]

## 2011-01-17 NOTE — Cardiovascular Report (Signed)
[name]. [facility]  Patient:    [name], [name] Visit Number: [id number] MRN: [id number]          Service Type: DSU Location: [facility] 2864 01 Attending Physician:  Berry, Jonathan [place] Dictated by:   Runell Gess, M.D. Proc. Date: 10/18/01 Admit Date:  10/18/2001   CC:         [facility]   Cardiac Catheterization  INDICATIONS:  [name] is a 65 year old single white male with history of CAD, status post bypass grafting in 1998.  He has PVOD, status post bilateral iliac stenting as well as right medial artery PTA and stenting.  He has had increasing claudication and is scheduled for a peripheral angiogram, however, recent carotid stress test revealed inferolateral ischemia toward the base and anterolateral ischemia toward the apex.  Though, he is asymptomatic.  He presents now for diagnostic coronary angiography to assess the status of his grafts plus or minus peripheral angiography.  DESCRIPTION OF PROCEDURE:  The patient was brought to the second floor [place] cardiac catheterization lab in the postabsorptive state.  He was premedicated with p.o. Valium.  His right groin was prepped and shaved in the usual sterile fashion.  1% Xylocaine was used for local anesthesia.  A 6 French sheath was inserted into the right femoral artery using standard Seldinger technique.  6 French right and left Judkins diagnostic catheters along with 6 French pigtail catheter were used for selective coronary angiography, left ventriculography, selective vein graft angiography, and selective INA angiography as well as distal abdominal aortography and selective iliac angiography.  Omnipaque dye was used for the entirety of the case.  Retrograde aortic, left ventricular, and pullback pressures were recorded.  HEMODYNAMICS: 1. Aortic systolic pressure 167, diastolic pressure 85. 2. Left ventricular systolic pressure 160, end diastolic  pressure 18.  SELECTIVE CORONARY ANGIOGRAPHY: 1. Left main normal. 2. LAD; The LAD was totally occluded after a first large septal perforator. 3. Left circumflex; this vessel was occluded in its midportion with left to    left collaterals. 4. Ramus branch; occluded proximally. 5. Right coronary artery; occluded in the midportion. 6. Sequential vein graft to acute marginal, PDA, and PLA; widely patent. 7. Vein graft to ramus and circumflex sequentially; this graft was widely    patent to the ramus.  The sequential segment was occluded. 8. LIMA to the diagonal branch; widely patent.  LEFT VENTRICULOGRAPHY:  RAO left ventriculogram was performed using 20 cc of Omnipaque dye at 10 cc per second.  The overall LVEF was estimated at approximately 55% with mild to moderate apical hypokinesia.  DISTAL ABDOMINAL AORTOGRAPHY:  Distal abdominal aortogram was performed using 20 cc of Omnipaque dye at 20 cc per second x2.  The right renal artery stent was widely patent.  Both iliac stents were widely patent.  IMPRESSION:  [name] has patent grafts with an occluded limb to OM, though he is asymptomatic and has normal LV function.  The remainder of his grafts are okay.  His peripheral stents are widely patent.  Plan will be for continued medical therapy.  The sheaths were removed and pressure was held in the groin to achieve hemostasis.  The patient left the lab in stable condition.  He will be discharged home today as an outpatient and will see me back in the office in two to three weeks for follow-up. Dictated by:   Runell Gess, M.D. Attending Physician:  Berry, Jonathan [place] DD:  10/18/01  TD:  10/18/01 Job: [id number]

## 2011-01-17 NOTE — Op Note (Signed)
[name], [name]              ACCOUNT NO.:  [id number]   MEDICAL RECORD NO.:  [id number]          PATIENT TYPE:  OIB   LOCATION:  5040                         FACILITY:  [facility]   PHYSICIAN:  Mark C. Ophelia Charter, M.D.    DATE OF BIRTH:  Jun 26, 1946   DATE OF PROCEDURE:  12/22/2005  DATE OF DISCHARGE:  12/23/2005                                 OPERATIVE REPORT   PREOPERATIVE DIAGNOSES:  Recurrent L3-4, L4-5, herniated nucleus pulposus  with free fragment.   POSTOPERATIVE DIAGNOSES:  Recurrent L3-4, L4-5, herniated nucleus pulposus  with free fragment.   PROCEDURE:  Left L3-4, L4-5, microdiskectomy, removal of free fragments.   SURGEON:  Annell Greening, MD.   ANESTHESIA:  GOT plus Marcaine skin local 8 mL.   COMPLICATIONS:  2 mm dural tear repaired with 6-0 Prolene water tight  closure.   BRIEF HISTORY:  This 65 year old male had a previous disk herniation, did  well after his surgery 3-4 years ago until he returned with recurrent severe  left leg pain, weakness and MRI scan demonstrating free fragment midway  between the 3-4 and 4-5 disk with disk herniation at both levels.   DESCRIPTION OF PROCEDURE:  After induction of general anesthesia, oral  tracheal intubation, the patient was placed on chest rolls, preoperative  antibiotics were given, DuraPrep was used here __________ Betadine and Vi-  Drape applied and laminectomy sheets and drapes. Old incision had been  marked sterilely prior to the Vi-Drape incision and this was opened,  extended proximally 1 cm. Needle localization with the needle at expected 3-  4 and 4-5 levels by palpation was taken. 3-4 needle was in line, the needle  expected to be at 4-5 was just above the disk. The patient had previous  laminotomy with partial removal at the top of 5, a good portion of 4. Facet  was identified at the 4-5 level and as the lateral gutter was being  separated using a microcurette a small dural tear occurred, a patty was  placed and  then continued work was performed until the lateral gutter was  free. The remaining top of the lamina was removed on the left at 4 and then  the 3-4 level was dissected removing a portion of the left L3 lamina up to  the level of the disk. There was moderate facet overhang causing some  foraminal stenosis and this was turned back with 2 and 3 mm Kerrison. The  disk was identified, annulus was incised, D'Errico retractor was used to  protect the dura and with the operating microscope in position the disk  fragments were removed. The 3-4 level did not have free fragment at that  level; however, there was lateral disk protrusion on the left and using a  ball-tip nerve hook and Epstein curettes, disk fragments were pushed into  the middle of the disk and then removed. Once diskectomy was complete as far  as removal of any fragments that were causing compression, attention was  then turned to the 4-5 level. Continued removal of bone in the lateral  gutter was performed past the area where  the tear was present and then the  patty was removed. The tear was present. Initially there was no apparent  tear in the pia and two Prolene sutures were placed. After the first  Prolene, there was spinal fluid leakage and with two simple sutures there  was water tight closure. The patient was bagged up to 40 cm of pressure to  increase intradural pressure and there was water tight closure with no  leakage. An intraop x-ray was taken with a #4 Penfield at the expected level  of the disk based on the pedicles and the nerve root above and x-ray  confirmed it was exactly on the appropriate level. Down at the base of the  floor, annulus was incised and immediately after a couple micropituitary  bites had been taken of the annulus, this loosened up the disk level. There  were some fragments that had migrated superiorly up underneath the ligament  which was grasped and then ball-tip nerve hook was used Probing  underneath  the dura immediately a large free fragment was encountered. This was flipped  out and then continued probing in the sac was performed until the free  fragment that was slightly center and a little bit more to the right side  was pulled in to view, grasped with a micropituitary and then removed. After  this, a hockey stick was used for probing in the sac, the wall of the sac  was flipped into the field and removed. Further passes were made in the  disk, up and down micropituitaries. The foramina was enlarged, bone was  removed out to the level of the pedicle. The dural repair which was a tear  1/2 to 2 mm was dry. There was no leakage. Final passes made anterior to the  dura with a hockey stick with 180 degree sweep made anterior to the dura and  in front of the nerve root showed no areas of remaining compression. The  dura was soft and both areas were irrigated. The fascia was closed with #0  Vicryl, 2-0 Vicryl in the subcutaneous tissue and a 4-0 Vicryl subcuticular  skin closure. Tincture of Benzoin and Steri-Strips, 4 x 4s, tape. Instrument  count and needle count was correct.      Mark C. Ophelia Charter, M.D.  Electronically Signed     MCY/MEDQ  D:  12/22/2005  T:  12/23/2005  Job:  [id number]

## 2011-01-17 NOTE — Procedures (Signed)
[place]  Patient:    [name], [name] Visit Number: [id number] MRN: [id number]          Service Type: END Location: ENDO Attending Physician:  Charna Elizabeth Dictated by:   Anselmo Rod, M.D. Proc. Date: 01/20/02 Admit Date:  01/20/2002   CC:         Kern Reap, M.D.   Procedure Report  DATE OF BIRTH:  [birth year]  REFERRING PHYSICIAN:  Kern Reap, M.D.  PROCEDURE PERFORMED:  Screening colonoscopy.  ENDOSCOPIST:  Anselmo Rod, M.D.  INSTRUMENT USED:  Olympus video colonoscope.  INDICATIONS FOR PROCEDURE:  A 65 year old white male with a history of colon cancer in his father and a personal history of seminoma in 58.  Rule out colonic polyps, masses, hemorrhoids, etc.  PREPROCEDURE PREPARATION:  Informed consent was procured from the patient. The patient was fasted for eight hours prior to the procedure and prepped with a bottle of magnesium citrate and a gallon of NuLytely the night prior to the procedure.  PREPROCEDURE PHYSICAL:  The patient had stable vital signs.  Neck supple. Chest clear to auscultation.  S1, S2 regular.  Abdomen soft with normal bowel sounds.  DESCRIPTION OF PROCEDURE:  The patient was placed in the left lateral decubitus position and sedated with 100 mg of Demerol and 7 mg of Versed intravenously.  Once the patient was adequately sedated and maintained on low-flow oxygen and continuous cardiac monitoring, the Olympus video colonoscope was advanced from the rectum to the cecum and terminal ileum without difficulty.  The entire colonic mucosa appeared healthy with a normal vascular pattern.  There was evidence of erosions, ulcerations, masses or polyps.  A few left-sided diverticula were present.  Small internal hemorrhoids were appreciated on retroflexion in the rectum.  The patient tolerated the procedure well without complication.  The procedure was complete up to the cecum.  The  appendiceal orifice and the ileocecal valve including the terminal ileum were clearly visualized and photographed.  IMPRESSION: 1. Essentially healthy-appearing colon except for a few early left-sided    diverticula. 2. Small nonbleeding internal hemorrhoids. 3. No masses or polyps seen.  RECOMMENDATIONS: 1. Repeat colorectal cancer screening is recommended in the next five years    unless the patient developed any abnormal symptoms in the interim. 2. A high fiber diet has been discussed with the patient and brochures on    diverticular disease have been handed to him for his education. 3. Outpatient follow-up is advised as need arises. 4. If the patient has any abnormal GI symptoms, he is to contact the office    immediately.Dictated by:   Anselmo Rod, M.D. Attending Physician:  Charna Elizabeth DD:  01/20/02 TD:  01/21/02 Job: [id number]

## 2011-05-24 DIAGNOSIS — I484 Atypical atrial flutter: Secondary | ICD-10-CM | POA: Insufficient documentation

## 2011-05-24 DIAGNOSIS — G4733 Obstructive sleep apnea (adult) (pediatric): Secondary | ICD-10-CM | POA: Insufficient documentation

## 2011-05-24 DIAGNOSIS — J449 Chronic obstructive pulmonary disease, unspecified: Secondary | ICD-10-CM | POA: Insufficient documentation

## 2011-05-24 DIAGNOSIS — I4510 Unspecified right bundle-branch block: Secondary | ICD-10-CM | POA: Insufficient documentation

## 2011-05-28 LAB — COMPREHENSIVE METABOLIC PANEL
ALT: 19
AST: 29
Albumin: 4.4
Alkaline Phosphatase: 83
BUN: 11
CO2: 25
Calcium: 9.8
Chloride: 101
Creatinine, Ser: 1.13
GFR calc Af Amer: >60
GFR calc non Af Amer: >60
Glucose, Bld: 104 — ABNORMAL HIGH
Potassium: 4.1
Sodium: 137
Total Bilirubin: 1.1
Total Protein: 7.7

## 2011-05-28 LAB — CBC
HCT: 48.5
HCT: 52.9 — ABNORMAL HIGH
HCT: 55.6 — ABNORMAL HIGH
Hemoglobin: 16.9
Hemoglobin: 17.7 — ABNORMAL HIGH
Hemoglobin: 18.9 — ABNORMAL HIGH
MCHC: 33.4
MCHC: 33.9
MCHC: 34.8
MCV: 96.9
MCV: 97
MCV: 97
Platelets: 197
Platelets: 209
Platelets: 243
RBC: 5
RBC: 5.45
RBC: 5.73
RDW: 14.4
RDW: 14.5
RDW: 14.9
WBC: 10.3
WBC: 11.7 — ABNORMAL HIGH
WBC: 12 — ABNORMAL HIGH

## 2011-05-28 LAB — URINALYSIS, ROUTINE W REFLEX MICROSCOPIC
Bilirubin Urine: NEGATIVE
Glucose, UA: NEGATIVE
Ketones, ur: NEGATIVE
Leukocytes, UA: NEGATIVE
Nitrite: NEGATIVE
Protein, ur: NEGATIVE
Specific Gravity, Urine: 1.018
Urobilinogen, UA: 1
pH: 6.5

## 2011-05-28 LAB — URINE MICROSCOPIC-ADD ON

## 2011-05-28 LAB — BASIC METABOLIC PANEL
BUN: 10
CO2: 29
Calcium: 9
Chloride: 100
Creatinine, Ser: 1.18
GFR calc Af Amer: >60
GFR calc non Af Amer: >60
Glucose, Bld: 111 — ABNORMAL HIGH
Potassium: 4.5
Sodium: 134 — ABNORMAL LOW

## 2011-05-28 LAB — LIPID PANEL
Cholesterol: 127
HDL: 30 — ABNORMAL LOW
LDL Cholesterol: 77
Total CHOL/HDL Ratio: 4.2
Triglycerides: 98
VLDL: 20

## 2011-05-28 LAB — CK TOTAL AND CKMB (NOT AT ARMC)
CK, MB: 1.8
CK, MB: 2.7
Relative Index: 1.5
Relative Index: 1.7
Total CK: 119
Total CK: 162

## 2011-05-28 LAB — DIFFERENTIAL
Basophils Absolute: 0
Basophils Relative: 0
Eosinophils Absolute: 0.7
Eosinophils Relative: 6 — ABNORMAL HIGH
Lymphocytes Relative: 34
Lymphs Abs: 4
Monocytes Absolute: 1.3 — ABNORMAL HIGH
Monocytes Relative: 11
Neutro Abs: 5.6
Neutrophils Relative %: 48

## 2011-05-28 LAB — TROPONIN I
Troponin I: 0.14 — ABNORMAL HIGH
Troponin I: 0.15 — ABNORMAL HIGH

## 2011-05-28 LAB — HEPARIN LEVEL (UNFRACTIONATED)
Heparin Unfractionated: 0.34
Heparin Unfractionated: 1.02 — ABNORMAL HIGH

## 2011-05-28 LAB — APTT: aPTT: 28

## 2011-05-28 LAB — PROTIME-INR
INR: 0.9
INR: 1
Prothrombin Time: 12.7
Prothrombin Time: 13.2

## 2011-05-29 LAB — BASIC METABOLIC PANEL
BUN: 11
CO2: 28
Calcium: 9.3
Chloride: 100
Creatinine, Ser: 1.13
GFR calc Af Amer: >60
GFR calc non Af Amer: >60
Glucose, Bld: 104 — ABNORMAL HIGH
Potassium: 4.9
Sodium: 136

## 2011-05-29 LAB — PROTIME-INR
INR: 2.8 — ABNORMAL HIGH
Prothrombin Time: 30.4 — ABNORMAL HIGH

## 2012-11-17 ENCOUNTER — Ambulatory Visit: Payer: Self-pay | Admitting: Cardiovascular Disease

## 2012-11-17 DIAGNOSIS — I480 Paroxysmal atrial fibrillation: Secondary | ICD-10-CM | POA: Insufficient documentation

## 2012-11-17 DIAGNOSIS — I4891 Unspecified atrial fibrillation: Secondary | ICD-10-CM

## 2012-11-17 DIAGNOSIS — Z7901 Long term (current) use of anticoagulants: Secondary | ICD-10-CM | POA: Insufficient documentation

## 2013-01-17 ENCOUNTER — Other Ambulatory Visit: Payer: Self-pay | Admitting: *Deleted

## 2013-01-17 MED ORDER — NIACIN ER (ANTIHYPERLIPIDEMIC) 1000 MG PO TBCR: 1000.0000 mg | EXTENDED_RELEASE_TABLET | Freq: Every day | ORAL | Status: DC

## 2013-01-17 MED ORDER — ATORVASTATIN CALCIUM 10 MG PO TABS: 10.0000 mg | ORAL_TABLET | Freq: Every day | ORAL | Status: DC

## 2013-01-17 MED ORDER — CLOPIDOGREL BISULFATE 75 MG PO TABS: 75.0000 mg | ORAL_TABLET | Freq: Every day | ORAL | Status: DC

## 2013-01-31 ENCOUNTER — Other Ambulatory Visit (HOSPITAL_COMMUNITY): Payer: Self-pay | Admitting: Cardiovascular Disease

## 2013-02-09 ENCOUNTER — Ambulatory Visit (INDEPENDENT_AMBULATORY_CARE_PROVIDER_SITE_OTHER): Payer: BC Managed Care – PPO | Admitting: Pharmacist Clinician (PhC)/ Clinical Pharmacy Specialist

## 2013-02-09 VITALS — BP 128/60 | HR 64

## 2013-02-09 DIAGNOSIS — Z7901 Long term (current) use of anticoagulants: Secondary | ICD-10-CM

## 2013-02-09 DIAGNOSIS — I4891 Unspecified atrial fibrillation: Secondary | ICD-10-CM

## 2013-02-09 LAB — POCT INR: INR: 2.7

## 2013-03-09 ENCOUNTER — Ambulatory Visit (INDEPENDENT_AMBULATORY_CARE_PROVIDER_SITE_OTHER): Payer: BC Managed Care – PPO | Admitting: Pharmacist Clinician (PhC)/ Clinical Pharmacy Specialist

## 2013-03-09 VITALS — BP 108/56 | HR 60

## 2013-03-09 DIAGNOSIS — I4891 Unspecified atrial fibrillation: Secondary | ICD-10-CM

## 2013-03-09 DIAGNOSIS — Z7901 Long term (current) use of anticoagulants: Secondary | ICD-10-CM

## 2013-03-09 LAB — POCT INR: INR: 3.4

## 2013-03-23 ENCOUNTER — Other Ambulatory Visit: Payer: Self-pay | Admitting: Cardiovascular Disease

## 2013-03-29 ENCOUNTER — Ambulatory Visit (INDEPENDENT_AMBULATORY_CARE_PROVIDER_SITE_OTHER): Payer: BC Managed Care – PPO | Admitting: Pharmacist Clinician (PhC)/ Clinical Pharmacy Specialist

## 2013-03-29 DIAGNOSIS — I4891 Unspecified atrial fibrillation: Secondary | ICD-10-CM

## 2013-03-29 DIAGNOSIS — Z7901 Long term (current) use of anticoagulants: Secondary | ICD-10-CM

## 2013-03-29 LAB — POCT INR: INR: 3.7

## 2013-04-06 ENCOUNTER — Other Ambulatory Visit: Payer: Self-pay

## 2013-04-06 MED ORDER — NIACIN ER (ANTIHYPERLIPIDEMIC) 1000 MG PO TBCR: 1000.0000 mg | EXTENDED_RELEASE_TABLET | Freq: Every day | ORAL | Status: DC

## 2013-04-06 MED ORDER — ATORVASTATIN CALCIUM 10 MG PO TABS: 10.0000 mg | ORAL_TABLET | Freq: Every day | ORAL | Status: DC

## 2013-04-06 NOTE — Telephone Encounter (Signed)
Rx was sent to pharmacy electronically. 

## 2013-04-19 ENCOUNTER — Ambulatory Visit (INDEPENDENT_AMBULATORY_CARE_PROVIDER_SITE_OTHER): Payer: BC Managed Care – PPO | Admitting: Pharmacist Clinician (PhC)/ Clinical Pharmacy Specialist

## 2013-04-19 DIAGNOSIS — I4891 Unspecified atrial fibrillation: Secondary | ICD-10-CM

## 2013-04-19 DIAGNOSIS — Z7901 Long term (current) use of anticoagulants: Secondary | ICD-10-CM

## 2013-04-19 LAB — POCT INR: INR: 2.9

## 2013-05-11 ENCOUNTER — Ambulatory Visit (INDEPENDENT_AMBULATORY_CARE_PROVIDER_SITE_OTHER): Payer: BC Managed Care – PPO | Admitting: Pharmacist Clinician (PhC)/ Clinical Pharmacy Specialist

## 2013-05-11 ENCOUNTER — Ambulatory Visit: Payer: BC Managed Care – PPO | Admitting: Cardiovascular Disease

## 2013-05-11 VITALS — BP 108/60 | HR 68

## 2013-05-11 DIAGNOSIS — I4891 Unspecified atrial fibrillation: Secondary | ICD-10-CM

## 2013-05-11 DIAGNOSIS — Z7901 Long term (current) use of anticoagulants: Secondary | ICD-10-CM

## 2013-05-11 LAB — POCT INR: INR: 2.2

## 2013-05-13 ENCOUNTER — Other Ambulatory Visit: Payer: Self-pay | Admitting: Cardiovascular Disease

## 2013-05-16 NOTE — Telephone Encounter (Signed)
Rx was sent to pharmacy electronically. 

## 2013-05-27 ENCOUNTER — Encounter: Payer: Self-pay | Admitting: Physician Assistant

## 2013-05-27 DIAGNOSIS — E785 Hyperlipidemia, unspecified: Secondary | ICD-10-CM

## 2013-05-27 DIAGNOSIS — E1169 Type 2 diabetes mellitus with other specified complication: Secondary | ICD-10-CM | POA: Insufficient documentation

## 2013-05-27 DIAGNOSIS — I10 Essential (primary) hypertension: Secondary | ICD-10-CM | POA: Insufficient documentation

## 2013-05-27 DIAGNOSIS — I739 Peripheral vascular disease, unspecified: Secondary | ICD-10-CM | POA: Insufficient documentation

## 2013-05-27 DIAGNOSIS — E119 Type 2 diabetes mellitus without complications: Secondary | ICD-10-CM

## 2013-05-27 DIAGNOSIS — E1129 Type 2 diabetes mellitus with other diabetic kidney complication: Secondary | ICD-10-CM | POA: Insufficient documentation

## 2013-05-27 DIAGNOSIS — I2510 Atherosclerotic heart disease of native coronary artery without angina pectoris: Secondary | ICD-10-CM | POA: Insufficient documentation

## 2013-05-27 DIAGNOSIS — Z951 Presence of aortocoronary bypass graft: Secondary | ICD-10-CM | POA: Insufficient documentation

## 2013-05-27 DIAGNOSIS — I480 Paroxysmal atrial fibrillation: Secondary | ICD-10-CM

## 2013-05-27 HISTORY — PX: TOOTH EXTRACTION: SUR596

## 2013-06-01 ENCOUNTER — Ambulatory Visit (INDEPENDENT_AMBULATORY_CARE_PROVIDER_SITE_OTHER): Payer: BC Managed Care – PPO | Admitting: Cardiovascular Disease

## 2013-06-01 ENCOUNTER — Encounter: Payer: Self-pay | Admitting: Cardiovascular Disease

## 2013-06-01 ENCOUNTER — Ambulatory Visit (INDEPENDENT_AMBULATORY_CARE_PROVIDER_SITE_OTHER): Payer: BC Managed Care – PPO | Admitting: Pharmacist Clinician (PhC)/ Clinical Pharmacy Specialist

## 2013-06-01 VITALS — BP 160/84 | HR 78 | Ht 73.0 in | Wt 234.0 lb

## 2013-06-01 DIAGNOSIS — Z7901 Long term (current) use of anticoagulants: Secondary | ICD-10-CM

## 2013-06-01 DIAGNOSIS — I2510 Atherosclerotic heart disease of native coronary artery without angina pectoris: Secondary | ICD-10-CM

## 2013-06-01 DIAGNOSIS — I4891 Unspecified atrial fibrillation: Secondary | ICD-10-CM

## 2013-06-01 DIAGNOSIS — E785 Hyperlipidemia, unspecified: Secondary | ICD-10-CM

## 2013-06-01 DIAGNOSIS — I739 Peripheral vascular disease, unspecified: Secondary | ICD-10-CM

## 2013-06-01 DIAGNOSIS — R5383 Other fatigue: Secondary | ICD-10-CM

## 2013-06-01 DIAGNOSIS — I480 Paroxysmal atrial fibrillation: Secondary | ICD-10-CM

## 2013-06-01 DIAGNOSIS — Z79899 Other long term (current) drug therapy: Secondary | ICD-10-CM

## 2013-06-01 DIAGNOSIS — R5381 Other malaise: Secondary | ICD-10-CM

## 2013-06-01 LAB — POCT INR: INR: 2

## 2013-06-01 NOTE — Patient Instructions (Addendum)
  We will see you back in follow up in 1 year with Dr Allyson Sabal.  Dr Allyson Sabal has ordered lexiscan myoview and lower extremity arterial dopplers

## 2013-06-01 NOTE — Progress Notes (Signed)
06/01/2013 [name]   07-30-1946  [id number]  Primary Physician No PCP Per Patient Primary Cardiologist: Runell Gess MD Roseanne Reno   HPI:  [id number] patient is a very pleasant 67 year old, moderately overweight, divorced, Caucasian male father of 2, grandfather of 1 grandchild who I saw 6 months ago. He has a history of CAD status post coronary artery bypass grafting x7 November 16, 1996. His other problems include PVOD status post bilateral iliac artery PTA and stenting by myself remotely with restenting in 2007. He has had right renal artery PTA and stenting as well. His other problems include hypertension, hyperlipidemia, non-insulin-requiring diabetes. He does have paroxysmal atrial fibrillation and has undergone multiple DC cardioversions as well as atrial fibrillation ablations by Dr. Al Corpus at [facility], most recently in May of last year, though he is now back in atrial fibrillation/flutter on a daily basis, which he is aware of but not symptomatic from. He is active and works out on a treadmill every day. He denies chest pain or shortness of breath. His last stress test performed 2 years ago revealed an inferolateral scar without ischemia.  His most recent lab work performed in August of 2012 revealed a total cholesterol of 134, LDL of 59, HDL 50, and Dopplers performed in our office just last month revealed ABIs of 1.1 on the right, 0.94 on the left with patent iliac stents.   Since I last saw him in the office 12/11/11 he denies chest pain or shortness of breath. He does play golf and working on the treadmill.      Current Outpatient Prescriptions  Medication Sig Dispense Refill  . amoxicillin (AMOXIL) 500 MG capsule Take 500 mg by mouth 3 (three) times daily. Will finish on 10/5      . atorvastatin (LIPITOR) 10 MG tablet Take 1 tablet (10 mg total) by mouth daily.  15 tablet  0  . clopidogrel (PLAVIX) 75 MG tablet Take 1 tablet (75 mg total) by mouth daily.  90  tablet  3  . lisinopril-hydrochlorothiazide (PRINZIDE,ZESTORETIC) 10-12.5 MG per tablet TAKE AS DIRECTED  30 tablet  2  . metoprolol succinate (TOPROL-XL) 100 MG 24 hr tablet Take 100 mg by mouth daily.       . niacin (NIASPAN) 1000 MG CR tablet Take 1 tablet (1,000 mg total) by mouth at bedtime.  15 tablet  0  . warfarin (COUMADIN) 5 MG tablet TAKE 1 TO 1&1/2 TABLETS EVERY DAY  110 tablet  1   No current facility-administered medications for this visit.    No Known Allergies  History   Social History  . Marital Status: Divorced    Spouse Name: N/A    Number of Children: N/A  . Years of Education: N/A   Occupational History  . Not on file.   Social History Main Topics  . Smoking status: Former Smoker    Quit date: 09/01/1994  . Smokeless tobacco: Not on file  . Alcohol Use: Not on file  . Drug Use: Not on file  . Sexual Activity: Not on file   Other Topics Concern  . Not on file   Social History Narrative  . No narrative on file     Review of Systems: General: negative for chills, fever, night sweats or weight changes.  Cardiovascular: negative for chest pain, dyspnea on exertion, edema, orthopnea, palpitations, paroxysmal nocturnal dyspnea or shortness of breath Dermatological: negative for rash Respiratory: negative for cough or wheezing Urologic: negative for hematuria Abdominal: negative  for nausea, vomiting, diarrhea, bright red blood per rectum, melena, or hematemesis Neurologic: negative for visual changes, syncope, or dizziness All other systems reviewed and are otherwise negative except as noted above.    Blood pressure 160/84, pulse 78, height 6\' 1"  (1.854 m), weight 234 lb (106.142 kg).  General appearance: alert and no distress Neck: no adenopathy, no carotid bruit, no JVD, supple, symmetrical, trachea midline and thyroid not enlarged, symmetric, no tenderness/mass/nodules Lungs: clear to auscultation bilaterally Heart: regular rate and rhythm, S1, S2  normal, no murmur, click, rub or gallop Extremities: extremities normal, atraumatic, no cyanosis or edema  EKG normal sinus rhythm at 78 with right bundle branch block  ASSESSMENT AND PLAN:   Status post coronary artery bypass grafting x 7, 11/16/1996 Status post coronary artery bypass grafting x73/18/98. His last Myoview stress test was performed 3 years ago and was low-risk/nonischemic. He denies chest pain or shortness of breath. He is a diabetic and therefore I am going to repeat a Lexa skin Myoview on him to assess the status of his 64 year old grafts.  Peripheral artery disease Status post bilateral iliac artery stenting with restenting as well as right renal artery stenting. We'll get followup lower x-ray Dopplers as well as renal Dopplers on him. He does currently deny claudication however.  Essential hypertension On appropriate medications. His blood pressure is mildly elevated but his Cathey Endow had a dental procedure and is experiencing a significant amount of pain.  Hyperlipidemia On statin therapy. We'll Will recheck a lipid and liver profile  Paroxysmal atrial fibrillation Status post ablation procedure fibrillation at [facility] by Dr. Carol Ada . The last ablation was 12/31/10 and he has maintained sinus rhythm on Coumadin anticoagulation.      Runell Gess MD FACP,FACC,FAHA, [facility] 06/01/2013 3:21 PM

## 2013-06-01 NOTE — Assessment & Plan Note (Signed)
On appropriate medications. His blood pressure is mildly elevated but his [name] had a dental procedure and is experiencing a significant amount of pain.

## 2013-06-01 NOTE — Assessment & Plan Note (Signed)
Status post coronary artery bypass grafting x73/18/98. His last Myoview stress test was performed 3 years ago and was low-risk/nonischemic. He denies chest pain or shortness of breath. He is a diabetic and therefore I am going to repeat a Lexa skin Myoview on him to assess the status of his 64 year old grafts.

## 2013-06-01 NOTE — Assessment & Plan Note (Addendum)
Status post ablation procedure fibrillation at [facility] by Dr. Carol Ada . The last ablation was 12/31/10 and he has maintained sinus rhythm on Coumadin anticoagulation.

## 2013-06-01 NOTE — Assessment & Plan Note (Signed)
Status post bilateral iliac artery stenting with restenting as well as right renal artery stenting. We'll get followup lower x-ray Dopplers as well as renal Dopplers on him. He does currently deny claudication however.

## 2013-06-01 NOTE — Assessment & Plan Note (Signed)
On statin therapy. We'll Will recheck a lipid and liver profile

## 2013-06-07 ENCOUNTER — Encounter: Payer: Self-pay | Admitting: Cardiovascular Disease

## 2013-06-07 ENCOUNTER — Ambulatory Visit (HOSPITAL_COMMUNITY)
Admission: RE | Admit: 2013-06-07 | Discharge: 2013-06-07 | Disposition: A | Discharge status: Home / Self Care | Payer: Medicare Other | Source: Ambulatory Visit | Attending: Cardiovascular Disease | Admitting: Cardiovascular Disease

## 2013-06-07 DIAGNOSIS — I2510 Atherosclerotic heart disease of native coronary artery without angina pectoris: Secondary | ICD-10-CM | POA: Insufficient documentation

## 2013-06-07 MED ORDER — REGADENOSON 0.4 MG/5ML IV SOLN
0.4000 mg | Freq: Once | INTRAVENOUS | Status: AC
  Administered 2013-06-07: 0.4 mg via INTRAVENOUS

## 2013-06-07 MED ORDER — TECHNETIUM TC 99M SESTAMIBI GENERIC - CARDIOLITE
11.0000 | Freq: Once | INTRAVENOUS | Status: AC | PRN
  Administered 2013-06-07: 11 via INTRAVENOUS

## 2013-06-07 MED ORDER — TECHNETIUM TC 99M SESTAMIBI GENERIC - CARDIOLITE
31.3000 | Freq: Once | INTRAVENOUS | Status: AC | PRN
  Administered 2013-06-07: 31.3 via INTRAVENOUS

## 2013-06-07 NOTE — Procedures (Addendum)
[name] [address] [place] [address] [phone]  Cardiology Nuclear Med Study  [name] is a 67 y.o. male     MRN : [id number]     DOB: November 13, 1945  Procedure Date: 06/07/2013  Nuclear Med Background Indication for Stress Test:  Graft Patency History:  CAD;MI--1998 & 2009;CABG X7--11/16/1996;PAF--S/P ABLATION 12/2010 Cardiac Risk Factors: Family History - CAD, History of Smoking, Hypertension, Lipids, NIDDM, Overweight, PVD and RBBB  Symptoms:  DOE and Palpitations   Nuclear Pre-Procedure Caffeine/Decaff Intake:  1:00am NPO After: 11AM   IV Site: R Hand  IV 0.9% NS with Angio Cath:  22g  Chest Size (in):  42" IV Started by: Emmit Pomfret, RN  Height: 6\' 1"  (1.854 m)  Cup Size: n/a  BMI:  Body mass index is 30.88 kg/(m^2). Weight:  234 lb (106.142 kg)   Tech Comments:  N/A    Nuclear Med Study 1 or 2 day study: 1 day  Stress Test Type:  Lexiscan  Order Authorizing Provider:  Nanetta Batty, MD   Resting Radionuclide: Technetium 61m Sestamibi  Resting Radionuclide Dose: 11.0 mCi   Stress Radionuclide:  Technetium 39m Sestamibi  Stress Radionuclide Dose: 31.3 mCi           Stress Protocol Rest HR: 80 Stress HR: 98  Rest BP: 134/97 Stress BP: 143/81  Exercise Time (min): n/a METS: n/a          Dose of Adenosine (mg):  n/a Dose of Lexiscan: 0.4 mg  Dose of Atropine (mg): n/a Dose of Dobutamine: n/a mcg/kg/min (at max HR)  Stress Test Technologist: Ernestene Mention, CCT Nuclear Technologist: Gonzella Hogan, CNMT   Rest Procedure:  Myocardial perfusion imaging was performed at rest 45 minutes following the intravenous administration of Technetium 79m Sestamibi. Stress Procedure:  The patient received IV Lexiscan 0.4 mg over 15-seconds.  Technetium 28m Sestamibi injected at 30-seconds.  There were no significant changes with Lexiscan.  Quantitative spect images were obtained after a 45 minute  delay.  Transient Ischemic Dilatation (Normal <1.22):  1.14 Lung/Heart Ratio (Normal <0.45):  0.39 QGS EDV:  n/a ml QGS ESV:  n/a ml LV Ejection Fraction: Study not gated  Signed by Gonzella Hogan, CNMT  PHYSICIAN INTERPRETATION  Rest ECG: Atrial Fibrilliation, RBBB and No acute changes  Stress ECG: No significant change from baseline ECG and There are scattered PVCs.  QPS Raw Data Images:  There is interference from nuclear activity from structures below the diaphragm. This does not affect the ability to read the study. Stress Images:  There is decreased uptake in the apex.  There is decreased uptake in the inferior wall.  There is decreased uptake in the lateral wall.  There is partial reversibility in this area.  There is a large sized moderate to severe intensity fixed inferoapical perfusion defect with additional defect noted in the mid to basal inferior & inferolateral wall that is mostly reversible. Rest Images:  There is decreased uptake in the apex.   There is decreased uptake in the inferior wall.   There is decreased uptake in the lateral wall. Subtraction (SDS):  There is a fixed defect that is most consistent with a previous infarction. There was reversibility noted in the mid to basal inferior & inferolateral walls, consistent with ischemia. These findings are consistent with distal vessel infarction with peri-infarct ischemia in the upstream vascular distribution of the OM or RPL system.   Impression Exercise Capacity:  [name]  with no exercise. BP Response:  Normal blood pressure response. Clinical Symptoms:  There is dyspnea. ECG Impression:  No significant ECG changes with Lexiscan. LV Wall Motion:  Not assessed due to ectopy / Afib. Comparison with Prior Nuclear Study: The fixed inferoapical defect is similar to the prior evaluation, but the more mid-basal reversibility does appear to be new.  These findings are consistent with ischemia.  Overall Impression:   Intermediate risk stress nuclear study with a medium sized, mild to moderate intensity reversible perfusion defect in the mid to basal inferior-inferolateral wall.[name]    Richard Lex, MD  06/07/2013 5:15 PM

## 2013-06-08 ENCOUNTER — Ambulatory Visit: Payer: BC Managed Care – PPO | Admitting: Pharmacist Clinician (PhC)/ Clinical Pharmacy Specialist

## 2013-06-09 NOTE — Progress Notes (Signed)
Quick Note:  Note sent to kay to schedule appointment. Also released in to my chart. ______

## 2013-06-13 LAB — COMPREHENSIVE METABOLIC PANEL
ALT: 14 U/L (ref 0–53)
AST: 17 U/L (ref 0–37)
Albumin: 4.3 g/dL (ref 3.5–5.2)
Alkaline Phosphatase: 91 U/L (ref 39–117)
BUN: 8 mg/dL (ref 6–23)
CO2: 30 mEq/L (ref 19–32)
Calcium: 9.7 mg/dL (ref 8.4–10.5)
Chloride: 99 mEq/L (ref 96–112)
Creat: 1.01 mg/dL (ref 0.50–1.35)
Glucose, Bld: 128 mg/dL — ABNORMAL HIGH (ref 70–99)
Potassium: 4.6 mEq/L (ref 3.5–5.3)
Sodium: 138 mEq/L (ref 135–145)
Total Bilirubin: 0.7 mg/dL (ref 0.3–1.2)
Total Protein: 7.7 g/dL (ref 6.0–8.3)

## 2013-06-13 LAB — LIPID PANEL
Cholesterol: 136 mg/dL (ref 0–200)
HDL: 38 mg/dL — ABNORMAL LOW (ref >39)
LDL Cholesterol: 62 mg/dL (ref 0–99)
Total CHOL/HDL Ratio: 3.6 Ratio
Triglycerides: 181 mg/dL — ABNORMAL HIGH (ref <150)
VLDL: 36 mg/dL (ref 0–40)

## 2013-06-13 LAB — TSH: TSH: 1.44 u[IU]/mL (ref 0.350–4.500)

## 2013-06-13 LAB — CBC
HCT: 48.1 % (ref 39.0–52.0)
Hemoglobin: 17.1 g/dL — ABNORMAL HIGH (ref 13.0–17.0)
MCH: 33.6 pg (ref 26.0–34.0)
MCHC: 35.6 g/dL (ref 30.0–36.0)
MCV: 94.5 fL (ref 78.0–100.0)
Platelets: 344 10*3/uL (ref 150–400)
RBC: 5.09 MIL/uL (ref 4.22–5.81)
RDW: 13.5 % (ref 11.5–15.5)
WBC: 8.9 10*3/uL (ref 4.0–10.5)

## 2013-06-15 ENCOUNTER — Encounter: Payer: Self-pay | Admitting: *Deleted

## 2013-06-16 ENCOUNTER — Other Ambulatory Visit: Payer: Self-pay | Admitting: Cardiovascular Disease

## 2013-06-17 NOTE — Telephone Encounter (Signed)
Rx was sent to pharmacy electronically. 

## 2013-06-22 ENCOUNTER — Ambulatory Visit (INDEPENDENT_AMBULATORY_CARE_PROVIDER_SITE_OTHER): Payer: BC Managed Care – PPO | Admitting: Cardiovascular Disease

## 2013-06-22 ENCOUNTER — Ambulatory Visit (HOSPITAL_COMMUNITY)
Admission: RE | Admit: 2013-06-22 | Discharge: 2013-06-22 | Disposition: A | Discharge status: Home / Self Care | Payer: Medicare Other | Source: Ambulatory Visit | Attending: Cardiovascular Disease | Admitting: Cardiovascular Disease

## 2013-06-22 ENCOUNTER — Ambulatory Visit: Payer: Medicare Other | Admitting: Pharmacist Clinician (PhC)/ Clinical Pharmacy Specialist

## 2013-06-22 ENCOUNTER — Encounter: Payer: Self-pay | Admitting: Cardiovascular Disease

## 2013-06-22 VITALS — BP 128/62 | HR 80 | Ht 73.0 in | Wt 236.6 lb

## 2013-06-22 DIAGNOSIS — I2510 Atherosclerotic heart disease of native coronary artery without angina pectoris: Secondary | ICD-10-CM | POA: Insufficient documentation

## 2013-06-22 DIAGNOSIS — I480 Paroxysmal atrial fibrillation: Secondary | ICD-10-CM

## 2013-06-22 DIAGNOSIS — Z951 Presence of aortocoronary bypass graft: Secondary | ICD-10-CM

## 2013-06-22 DIAGNOSIS — I70219 Atherosclerosis of native arteries of extremities with intermittent claudication, unspecified extremity: Secondary | ICD-10-CM

## 2013-06-22 DIAGNOSIS — Z7901 Long term (current) use of anticoagulants: Secondary | ICD-10-CM

## 2013-06-22 DIAGNOSIS — I739 Peripheral vascular disease, unspecified: Secondary | ICD-10-CM

## 2013-06-22 LAB — POCT INR: INR: 2.9

## 2013-06-22 MED ORDER — METOPROLOL SUCCINATE ER 100 MG PO TB24: 100.0000 mg | ORAL_TABLET | Freq: Every day | ORAL | Status: DC

## 2013-06-22 MED ORDER — LISINOPRIL-HYDROCHLOROTHIAZIDE 10-12.5 MG PO TABS: ORAL_TABLET | ORAL | Status: DC

## 2013-06-22 MED ORDER — NIACIN ER (ANTIHYPERLIPIDEMIC) 1000 MG PO TBCR: EXTENDED_RELEASE_TABLET | ORAL | Status: DC

## 2013-06-22 MED ORDER — ATORVASTATIN CALCIUM 10 MG PO TABS: ORAL_TABLET | ORAL | Status: DC

## 2013-06-22 MED ORDER — CLOPIDOGREL BISULFATE 75 MG PO TABS: 75.0000 mg | ORAL_TABLET | Freq: Every day | ORAL | Status: DC

## 2013-06-22 NOTE — Assessment & Plan Note (Signed)
Unchanged myoview. ROV 6 minths

## 2013-06-22 NOTE — Patient Instructions (Signed)
Your physician wants you to follow-up in: 6 months with an extender and 1 year with Dr Berry. You will receive a reminder letter in the mail two months in advance. If you don't receive a letter, please call our office to schedule the follow-up appointment.  

## 2013-06-22 NOTE — Progress Notes (Signed)
Pt with unchanged myoview. ROV 6 months  Runell Gess, M.D., [facility] [address] [place], [place]  [address]  [phone] 06/22/2013 2:40 PM

## 2013-06-22 NOTE — Progress Notes (Signed)
Lower Extremity Arterial Duplex Completed. °Brianna L Mazza,RVT °

## 2013-06-28 ENCOUNTER — Other Ambulatory Visit (HOSPITAL_COMMUNITY): Payer: Self-pay | Admitting: Cardiovascular Disease

## 2013-06-28 ENCOUNTER — Encounter: Payer: Self-pay | Admitting: *Deleted

## 2013-06-28 ENCOUNTER — Telehealth: Payer: Self-pay | Admitting: *Deleted

## 2013-06-28 DIAGNOSIS — I739 Peripheral vascular disease, unspecified: Secondary | ICD-10-CM

## 2013-06-28 NOTE — Telephone Encounter (Signed)
Order placed for repeat lower extremity dopplers in 1 year 

## 2013-06-28 NOTE — Telephone Encounter (Signed)
Message copied by Marella Bile on Tue Jun 28, 2013  4:28 PM ------      Message from: Runell Gess      Created: Sat Jun 25, 2013  4:56 PM       No change from prior study. Repeat in 12 months. ------

## 2013-07-03 ENCOUNTER — Encounter: Payer: Self-pay | Admitting: *Deleted

## 2013-07-05 ENCOUNTER — Ambulatory Visit (HOSPITAL_COMMUNITY)
Admission: RE | Admit: 2013-07-05 | Discharge: 2013-07-05 | Disposition: A | Discharge status: Home / Self Care | Payer: Medicare Other | Source: Ambulatory Visit | Attending: Internal Medicine | Admitting: Internal Medicine

## 2013-07-05 DIAGNOSIS — I6529 Occlusion and stenosis of unspecified carotid artery: Secondary | ICD-10-CM | POA: Insufficient documentation

## 2013-07-05 NOTE — Progress Notes (Signed)
Carotid Duplex Completed. °Brianna L Mazza,RVT °

## 2013-07-07 ENCOUNTER — Other Ambulatory Visit: Payer: Self-pay

## 2013-07-10 ENCOUNTER — Encounter: Payer: Self-pay | Admitting: *Deleted

## 2013-08-03 ENCOUNTER — Ambulatory Visit (INDEPENDENT_AMBULATORY_CARE_PROVIDER_SITE_OTHER): Payer: BC Managed Care – PPO | Admitting: Pharmacist Clinician (PhC)/ Clinical Pharmacy Specialist

## 2013-08-03 VITALS — BP 110/62 | HR 76

## 2013-08-03 DIAGNOSIS — I480 Paroxysmal atrial fibrillation: Secondary | ICD-10-CM

## 2013-08-03 DIAGNOSIS — Z7901 Long term (current) use of anticoagulants: Secondary | ICD-10-CM

## 2013-08-03 DIAGNOSIS — I4891 Unspecified atrial fibrillation: Secondary | ICD-10-CM

## 2013-08-03 LAB — POCT INR: INR: 2.3

## 2013-09-05 ENCOUNTER — Telehealth: Payer: Self-pay | Admitting: Pharmacist Clinician (PhC)/ Clinical Pharmacy Specialist

## 2013-09-05 MED ORDER — WARFARIN SODIUM 5 MG PO TABS: ORAL_TABLET | ORAL | Status: DC

## 2013-09-05 NOTE — Telephone Encounter (Signed)
Need refill on Warfarin 5 mg # 110

## 2013-09-14 ENCOUNTER — Ambulatory Visit (INDEPENDENT_AMBULATORY_CARE_PROVIDER_SITE_OTHER): Payer: Medicare Other | Admitting: Pharmacist Clinician (PhC)/ Clinical Pharmacy Specialist

## 2013-09-14 VITALS — BP 118/60 | HR 72

## 2013-09-14 DIAGNOSIS — I4891 Unspecified atrial fibrillation: Secondary | ICD-10-CM

## 2013-09-14 DIAGNOSIS — Z7901 Long term (current) use of anticoagulants: Secondary | ICD-10-CM

## 2013-09-14 DIAGNOSIS — I480 Paroxysmal atrial fibrillation: Secondary | ICD-10-CM

## 2013-09-14 LAB — POCT INR: INR: 2.4

## 2013-10-26 ENCOUNTER — Ambulatory Visit (INDEPENDENT_AMBULATORY_CARE_PROVIDER_SITE_OTHER): Payer: Medicare Other | Admitting: Pharmacist Clinician (PhC)/ Clinical Pharmacy Specialist

## 2013-10-26 VITALS — BP 100/62 | HR 76

## 2013-10-26 DIAGNOSIS — I4891 Unspecified atrial fibrillation: Secondary | ICD-10-CM

## 2013-10-26 DIAGNOSIS — I480 Paroxysmal atrial fibrillation: Secondary | ICD-10-CM

## 2013-10-26 DIAGNOSIS — Z7901 Long term (current) use of anticoagulants: Secondary | ICD-10-CM

## 2013-10-26 LAB — POCT INR: INR: 1.7

## 2013-11-14 ENCOUNTER — Other Ambulatory Visit: Payer: Self-pay | Admitting: Pharmacist Clinician (PhC)/ Clinical Pharmacy Specialist

## 2013-11-14 DIAGNOSIS — I4891 Unspecified atrial fibrillation: Secondary | ICD-10-CM

## 2013-11-14 DIAGNOSIS — Z7901 Long term (current) use of anticoagulants: Secondary | ICD-10-CM

## 2013-11-16 ENCOUNTER — Ambulatory Visit (INDEPENDENT_AMBULATORY_CARE_PROVIDER_SITE_OTHER): Payer: Medicare Other | Admitting: Pharmacist Clinician (PhC)/ Clinical Pharmacy Specialist

## 2013-11-16 DIAGNOSIS — Z7901 Long term (current) use of anticoagulants: Secondary | ICD-10-CM

## 2013-11-16 DIAGNOSIS — I480 Paroxysmal atrial fibrillation: Secondary | ICD-10-CM

## 2013-11-16 DIAGNOSIS — I4891 Unspecified atrial fibrillation: Secondary | ICD-10-CM

## 2013-11-16 LAB — POCT INR: INR: 2.4

## 2013-12-28 ENCOUNTER — Ambulatory Visit (INDEPENDENT_AMBULATORY_CARE_PROVIDER_SITE_OTHER): Payer: Medicare Other | Admitting: Pharmacist Clinician (PhC)/ Clinical Pharmacy Specialist

## 2013-12-28 DIAGNOSIS — I480 Paroxysmal atrial fibrillation: Secondary | ICD-10-CM

## 2013-12-28 DIAGNOSIS — Z7901 Long term (current) use of anticoagulants: Secondary | ICD-10-CM

## 2013-12-28 DIAGNOSIS — I4891 Unspecified atrial fibrillation: Secondary | ICD-10-CM

## 2013-12-28 LAB — POCT INR: INR: 2.4

## 2014-02-08 ENCOUNTER — Ambulatory Visit (INDEPENDENT_AMBULATORY_CARE_PROVIDER_SITE_OTHER): Payer: Medicare Other | Admitting: Pharmacist Clinician (PhC)/ Clinical Pharmacy Specialist

## 2014-02-08 DIAGNOSIS — Z7901 Long term (current) use of anticoagulants: Secondary | ICD-10-CM

## 2014-02-08 DIAGNOSIS — I480 Paroxysmal atrial fibrillation: Secondary | ICD-10-CM

## 2014-02-08 DIAGNOSIS — I4891 Unspecified atrial fibrillation: Secondary | ICD-10-CM

## 2014-02-08 LAB — POCT INR: INR: 2.2

## 2014-03-22 ENCOUNTER — Ambulatory Visit (INDEPENDENT_AMBULATORY_CARE_PROVIDER_SITE_OTHER): Payer: Medicare Other | Admitting: Pharmacist Clinician (PhC)/ Clinical Pharmacy Specialist

## 2014-03-22 ENCOUNTER — Other Ambulatory Visit: Payer: Self-pay | Admitting: Pharmacist Clinician (PhC)/ Clinical Pharmacy Specialist

## 2014-03-22 DIAGNOSIS — I4891 Unspecified atrial fibrillation: Secondary | ICD-10-CM

## 2014-03-22 DIAGNOSIS — I480 Paroxysmal atrial fibrillation: Secondary | ICD-10-CM

## 2014-03-22 DIAGNOSIS — Z7901 Long term (current) use of anticoagulants: Secondary | ICD-10-CM

## 2014-03-22 LAB — POCT INR: INR: 2.3

## 2014-04-25 DIAGNOSIS — I219 Acute myocardial infarction, unspecified: Secondary | ICD-10-CM | POA: Insufficient documentation

## 2014-05-03 ENCOUNTER — Ambulatory Visit (INDEPENDENT_AMBULATORY_CARE_PROVIDER_SITE_OTHER): Payer: Medicare Other | Admitting: Pharmacist Clinician (PhC)/ Clinical Pharmacy Specialist

## 2014-05-03 DIAGNOSIS — I4891 Unspecified atrial fibrillation: Secondary | ICD-10-CM

## 2014-05-03 DIAGNOSIS — I480 Paroxysmal atrial fibrillation: Secondary | ICD-10-CM

## 2014-05-03 DIAGNOSIS — Z7901 Long term (current) use of anticoagulants: Secondary | ICD-10-CM

## 2014-05-03 LAB — POCT INR: INR: 1.9

## 2014-06-05 ENCOUNTER — Encounter (HOSPITAL_COMMUNITY): Payer: Self-pay | Admitting: *Deleted

## 2014-06-14 ENCOUNTER — Ambulatory Visit (HOSPITAL_COMMUNITY)
Admission: RE | Admit: 2014-06-14 | Discharge: 2014-06-14 | Disposition: A | Discharge status: Home / Self Care | Payer: Medicare Other | Source: Ambulatory Visit | Attending: Cardiology | Admitting: Cardiology

## 2014-06-14 DIAGNOSIS — I739 Peripheral vascular disease, unspecified: Secondary | ICD-10-CM

## 2014-06-14 NOTE — Progress Notes (Signed)
Lower Extremity Arterial Duplex Completed. °Brianna L Mazza,RVT °

## 2014-06-16 ENCOUNTER — Other Ambulatory Visit: Payer: Self-pay

## 2014-06-20 ENCOUNTER — Encounter: Payer: Self-pay | Admitting: Cardiovascular Disease

## 2014-06-20 ENCOUNTER — Ambulatory Visit (INDEPENDENT_AMBULATORY_CARE_PROVIDER_SITE_OTHER): Payer: Medicare Other | Admitting: *Deleted

## 2014-06-20 ENCOUNTER — Ambulatory Visit (INDEPENDENT_AMBULATORY_CARE_PROVIDER_SITE_OTHER): Payer: Medicare Other | Admitting: Cardiovascular Disease

## 2014-06-20 VITALS — BP 116/78 | HR 75 | Ht 73.0 in | Wt 233.8 lb

## 2014-06-20 DIAGNOSIS — I739 Peripheral vascular disease, unspecified: Secondary | ICD-10-CM

## 2014-06-20 DIAGNOSIS — I2510 Atherosclerotic heart disease of native coronary artery without angina pectoris: Secondary | ICD-10-CM

## 2014-06-20 DIAGNOSIS — I10 Essential (primary) hypertension: Secondary | ICD-10-CM

## 2014-06-20 DIAGNOSIS — E785 Hyperlipidemia, unspecified: Secondary | ICD-10-CM

## 2014-06-20 DIAGNOSIS — I4891 Unspecified atrial fibrillation: Secondary | ICD-10-CM

## 2014-06-20 DIAGNOSIS — I480 Paroxysmal atrial fibrillation: Secondary | ICD-10-CM

## 2014-06-20 DIAGNOSIS — I2583 Coronary atherosclerosis due to lipid rich plaque: Secondary | ICD-10-CM

## 2014-06-20 DIAGNOSIS — Z7901 Long term (current) use of anticoagulants: Secondary | ICD-10-CM

## 2014-06-20 LAB — POCT INR: INR: 2.2

## 2014-06-20 NOTE — Assessment & Plan Note (Signed)
History of peripheral arterial disease status post bilateral iliac stenting by myself remotely with restenting in 2007. He's also had right renal artery PTA and stenting as well. Left lower extremity arterial Doppler study performed 06/14/14 revealed ABIs in the 0.9 range bilaterally with patent iliac stents. He denies claudication.

## 2014-06-20 NOTE — Assessment & Plan Note (Signed)
Status post atrial fibrillation ablation by Dr. Clydene Laming at [facility] with multiple cardioversions prior to that time. He currently is in sinus rhythm with sinus arrhythmia and PVCs on Coumadin anticoagulation.

## 2014-06-20 NOTE — Patient Instructions (Signed)
Your physician wants you to follow-up in: 1 year with Dr Berry. You will receive a reminder letter in the mail two months in advance. If you don't receive a letter, please call our office to schedule the follow-up appointment.  

## 2014-06-20 NOTE — Assessment & Plan Note (Signed)
Patient has a history of CAD status post coronary artery bypass grafting x7  11/16/96. He had a low risk Myoview in October of last year and denies chest pain or shortness of breath.

## 2014-06-20 NOTE — Assessment & Plan Note (Signed)
On statin therapy with recent lipid profile performed by his PCP 04/25/14 revealing a total cholesterol of 144, LDL 81 HDL of 41.

## 2014-06-20 NOTE — Progress Notes (Signed)
06/20/2014 [name]   06/28/1946  [id number]  Primary Physician Reita Cliche, MD Primary Cardiologist: Lorretta Harp MD Renae Gloss   HPI: The patient is a very pleasant 68 year old, moderately overweight, divorced, Caucasian male father of 2, grandfather of 1 grandchild who I saw 12 months ago. He has a history of CAD status post coronary artery bypass grafting x7 November 16, 1996. His other problems include PVOD status post bilateral iliac artery PTA and stenting by myself remotely with restenting in 2007. He has had right renal artery PTA and stenting as well. His other problems include hypertension, hyperlipidemia, non-insulin-requiring diabetes. He does have paroxysmal atrial fibrillation and has undergone multiple DC cardioversions as well as atrial fibrillation ablations by Dr. Samara Deist at [facility], most recently in May of last year, though he is now back in atrial fibrillation/flutter on a daily basis, which he is aware of but not symptomatic from. He is active and works out on a treadmill every day. He denies chest pain or shortness of breath. His last stress test performed one year ago which revealed an inferolateral scar without ischemia.  Since I saw him at 12 months ago has remained clinically stable. He denies chest pain, shortness of breath or claudication. He works out on a treadmill or plays golf daily. He is a Myoview stress test performed in October of last year that showed inferolateral scar without ischemia unchanged from prior studies. Recent arterial Doppler studies of his lower extremities revealed ABIs in the 0.9 range bilaterally with patent iliac stents.    Current Outpatient Prescriptions  Medication Sig Dispense Refill  . atorvastatin (LIPITOR) 10 MG tablet TAKE 1 TABLET BY MOUTH EVERY DAY  90 tablet  3  . clopidogrel (PLAVIX) 75 MG tablet Take 1 tablet (75 mg total) by mouth daily.  90 tablet  3  . Coenzyme Q10 (COQ-10) 100 MG CAPS Take 1  capsule by mouth daily.      . furosemide (LASIX) 80 MG tablet Take 40 mg by mouth as needed.      Javier Docker Oil 300 MG CAPS 1 cap by mouth daily (Omega 3 Krill oil) 300mg       . lisinopril-hydrochlorothiazide (PRINZIDE,ZESTORETIC) 10-12.5 MG per tablet Take 1 tablet by mouth daily. Take as directed      . metoprolol succinate (TOPROL-XL) 100 MG 24 hr tablet Take 1 tablet (100 mg total) by mouth daily.  90 tablet  3  . Multiple Vitamin tablet Take 1 tablet by mouth daily.      . niacin (NIASPAN) 1000 MG CR tablet TAKE 1 TABLET BY MOUTH AT BEDTIME  90 tablet  3  . NON FORMULARY at bedtime. CPAP      . warfarin (COUMADIN) 5 MG tablet TAKE 1-1&1/2 TABLETS BY MOUTH EVERY DAY AS DIRECTED  110 tablet  1   No current facility-administered medications for this visit.    No Known Allergies  History   Social History  . Marital Status: Divorced    Spouse Name: N/A    Number of Children: N/A  . Years of Education: N/A   Occupational History  . Not on file.   Social History Main Topics  . Smoking status: Former Smoker    Quit date: 09/01/1994  . Smokeless tobacco: Never Used  . Alcohol Use: 0.6 - 1.2 oz/week    1-2 Glasses of wine per week  . Drug Use: No  . Sexual Activity: Not on file   Other Topics  Concern  . Not on file   Social History Narrative  . No narrative on file     Review of Systems: General: negative for chills, fever, night sweats or weight changes.  Cardiovascular: negative for chest pain, dyspnea on exertion, edema, orthopnea, palpitations, paroxysmal nocturnal dyspnea or shortness of breath Dermatological: negative for rash Respiratory: negative for cough or wheezing Urologic: negative for hematuria Abdominal: negative for nausea, vomiting, diarrhea, bright red blood per rectum, melena, or hematemesis Neurologic: negative for visual changes, syncope, or dizziness All other systems reviewed and are otherwise negative except as noted above.    Blood pressure  116/78, pulse 75, height 6\' 1"  (1.854 m), weight 233 lb 12.8 oz (106.051 kg).  General appearance: alert and no distress Neck: no adenopathy, no carotid bruit, no JVD, supple, symmetrical, trachea midline and thyroid not enlarged, symmetric, no tenderness/mass/nodules Lungs: clear to auscultation bilaterally Heart: regular rate and rhythm, S1, S2 normal, no murmur, click, rub or gallop Extremities: extremities normal, atraumatic, no cyanosis or edema  EKG normal sinus rhythm at 75 with sinus arrhythmia right bundle branch block  ASSESSMENT AND PLAN:   Paroxysmal atrial fibrillation Status post atrial fibrillation ablation by Dr. Clydene Laming at [facility] with multiple cardioversions prior to that time. He currently is in sinus rhythm with sinus arrhythmia and PVCs on Coumadin anticoagulation.  Coronary artery disease Patient has a history of CAD status post coronary artery bypass grafting x7  11/16/96. He had a low risk Myoview in October of last year and denies chest pain or shortness of breath.  Peripheral artery disease History of peripheral arterial disease status post bilateral iliac stenting by myself remotely with restenting in 2007. He's also had right renal artery PTA and stenting as well. Left lower extremity arterial Doppler study performed 06/14/14 revealed ABIs in the 0.9 range bilaterally with patent iliac stents. He denies claudication.  Essential hypertension Controlled on current medications  Hyperlipidemia On statin therapy with recent lipid profile performed by his PCP 04/25/14 revealing a total cholesterol of 144, LDL 81 HDL of 41.      Lorretta Harp MD FACP,FACC,FAHA, [facility] 06/20/2014 11:41 AM

## 2014-06-20 NOTE — Assessment & Plan Note (Signed)
Controlled on current medications 

## 2014-06-23 ENCOUNTER — Other Ambulatory Visit: Payer: Self-pay | Admitting: Cardiovascular Disease

## 2014-06-25 NOTE — Telephone Encounter (Signed)
Rx was sent to pharmacy electronically. 

## 2014-08-02 ENCOUNTER — Ambulatory Visit (INDEPENDENT_AMBULATORY_CARE_PROVIDER_SITE_OTHER): Payer: Medicare Other | Admitting: Pharmacist Clinician (PhC)/ Clinical Pharmacy Specialist

## 2014-08-02 DIAGNOSIS — I480 Paroxysmal atrial fibrillation: Secondary | ICD-10-CM

## 2014-08-02 LAB — POCT INR: INR: 1.9

## 2014-08-21 ENCOUNTER — Other Ambulatory Visit: Payer: Self-pay | Admitting: Cardiovascular Disease

## 2014-09-07 ENCOUNTER — Other Ambulatory Visit: Payer: Self-pay | Admitting: Pharmacist Clinician (PhC)/ Clinical Pharmacy Specialist

## 2014-09-13 ENCOUNTER — Ambulatory Visit (INDEPENDENT_AMBULATORY_CARE_PROVIDER_SITE_OTHER): Payer: Medicare Other | Admitting: Pharmacist Clinician (PhC)/ Clinical Pharmacy Specialist

## 2014-09-13 DIAGNOSIS — I480 Paroxysmal atrial fibrillation: Secondary | ICD-10-CM

## 2014-09-13 DIAGNOSIS — Z7901 Long term (current) use of anticoagulants: Secondary | ICD-10-CM

## 2014-09-13 DIAGNOSIS — I4891 Unspecified atrial fibrillation: Secondary | ICD-10-CM

## 2014-09-13 LAB — POCT INR: INR: 2.6

## 2014-10-25 ENCOUNTER — Ambulatory Visit (INDEPENDENT_AMBULATORY_CARE_PROVIDER_SITE_OTHER): Payer: Medicare Other | Admitting: Pharmacist Clinician (PhC)/ Clinical Pharmacy Specialist

## 2014-10-25 DIAGNOSIS — I480 Paroxysmal atrial fibrillation: Secondary | ICD-10-CM

## 2014-10-25 DIAGNOSIS — Z7901 Long term (current) use of anticoagulants: Secondary | ICD-10-CM

## 2014-10-25 DIAGNOSIS — I4891 Unspecified atrial fibrillation: Secondary | ICD-10-CM

## 2014-10-25 LAB — POCT INR: INR: 2.8

## 2014-12-06 ENCOUNTER — Ambulatory Visit (INDEPENDENT_AMBULATORY_CARE_PROVIDER_SITE_OTHER): Payer: Medicare Other | Admitting: Pharmacist Clinician (PhC)/ Clinical Pharmacy Specialist

## 2014-12-06 VITALS — HR 116

## 2014-12-06 DIAGNOSIS — I4891 Unspecified atrial fibrillation: Secondary | ICD-10-CM

## 2014-12-06 DIAGNOSIS — Z7901 Long term (current) use of anticoagulants: Secondary | ICD-10-CM

## 2014-12-06 DIAGNOSIS — I480 Paroxysmal atrial fibrillation: Secondary | ICD-10-CM | POA: Diagnosis not present

## 2014-12-06 LAB — POCT INR: INR: 2.3

## 2015-01-11 ENCOUNTER — Encounter: Payer: Self-pay | Admitting: Cardiovascular Disease

## 2015-01-17 ENCOUNTER — Ambulatory Visit (INDEPENDENT_AMBULATORY_CARE_PROVIDER_SITE_OTHER): Payer: Medicare Other | Admitting: Pharmacist Clinician (PhC)/ Clinical Pharmacy Specialist

## 2015-01-17 DIAGNOSIS — I4891 Unspecified atrial fibrillation: Secondary | ICD-10-CM

## 2015-01-17 DIAGNOSIS — Z7901 Long term (current) use of anticoagulants: Secondary | ICD-10-CM | POA: Diagnosis not present

## 2015-01-17 DIAGNOSIS — I480 Paroxysmal atrial fibrillation: Secondary | ICD-10-CM | POA: Diagnosis not present

## 2015-01-17 LAB — POCT INR: INR: 2.7

## 2015-02-24 ENCOUNTER — Other Ambulatory Visit: Payer: Self-pay | Admitting: Cardiovascular Disease

## 2015-02-26 ENCOUNTER — Other Ambulatory Visit: Payer: Self-pay

## 2015-02-26 NOTE — Telephone Encounter (Signed)
Rx(s) sent to pharmacy electronically.  

## 2015-02-28 ENCOUNTER — Ambulatory Visit (INDEPENDENT_AMBULATORY_CARE_PROVIDER_SITE_OTHER): Payer: Medicare Other | Admitting: Pharmacist Clinician (PhC)/ Clinical Pharmacy Specialist

## 2015-02-28 VITALS — HR 117

## 2015-02-28 DIAGNOSIS — I480 Paroxysmal atrial fibrillation: Secondary | ICD-10-CM | POA: Diagnosis not present

## 2015-02-28 DIAGNOSIS — I4891 Unspecified atrial fibrillation: Secondary | ICD-10-CM | POA: Diagnosis not present

## 2015-02-28 DIAGNOSIS — Z7901 Long term (current) use of anticoagulants: Secondary | ICD-10-CM | POA: Diagnosis not present

## 2015-02-28 LAB — POCT INR: INR: 2.8

## 2015-02-28 NOTE — Progress Notes (Signed)
EKG performed for patient for c/o tachycardia - pt denied other symptoms w/ exception of 'mild' fatigue. Dr. Gwenlyn Found acknowledged & interpreted EKG results - advised to add patient to his schedule Friday 03/02/15 am. Patient voiced understanding of instruction to return for OV w/ Dr. Gwenlyn Found 03/02/15 '@11'$ :15am.

## 2015-03-02 ENCOUNTER — Encounter: Payer: Self-pay | Admitting: Cardiovascular Disease

## 2015-03-02 ENCOUNTER — Ambulatory Visit (INDEPENDENT_AMBULATORY_CARE_PROVIDER_SITE_OTHER): Payer: Medicare Other | Admitting: Cardiovascular Disease

## 2015-03-02 VITALS — BP 110/88 | HR 118 | Ht 73.0 in | Wt 228.9 lb

## 2015-03-02 DIAGNOSIS — R5383 Other fatigue: Secondary | ICD-10-CM | POA: Diagnosis not present

## 2015-03-02 DIAGNOSIS — Z01818 Encounter for other preprocedural examination: Secondary | ICD-10-CM

## 2015-03-02 DIAGNOSIS — I4891 Unspecified atrial fibrillation: Secondary | ICD-10-CM | POA: Diagnosis not present

## 2015-03-02 NOTE — Assessment & Plan Note (Signed)
History of paroxysmal atrial fibrillation status post A. Fib ablation by Dr. Lurene Shadow at [facility] twice in the past.he is on Coumadin anticoagulation and has been therapeutic. He's been in what sounds like A. Fib with RVR for the last several months but rates in the 120 range. I'm going to arrange for him to undergo outpatient DC cardioversion followed by a referral to Dr. Rayann Heman for further evaluation.

## 2015-03-02 NOTE — Assessment & Plan Note (Signed)
History of hyperlipidemia on atorvastatin 10 mg a day followed by his PCP

## 2015-03-02 NOTE — Patient Instructions (Addendum)
Your physician has recommended that you have a Cardioversion (DCCV). Electrical Cardioversion uses a jolt of electricity to your heart either through paddles or wired patches attached to your chest. This is a controlled, usually prescheduled, procedure. Defibrillation is done under light anesthesia in the hospital, and you usually go home the day of the procedure. This is done to get your heart back into a normal rhythm. You are not awake for the procedure. Please see the instruction sheet given to you today.  You will be required to have the following tests prior to the procedure: Blood work-the blood work can be done no more than 7 days prior to the procedure.  It can be done at any [facility] lab.  There is one downstairs on the first floor of this building and one in the [place] ([address])    Dr Gwenlyn Found has referred you to Dr Thompson Grayer for atrial fibrillation.  Dr Gwenlyn Found recommends that you schedule a follow-up appointment in 3 months.

## 2015-03-02 NOTE — Assessment & Plan Note (Signed)
History of hypertension with blood pressure measured at 110/88. He is on lisinopril, hydrochlorothiazide, and metoprolol. Continue current meds at current dosing

## 2015-03-02 NOTE — Assessment & Plan Note (Signed)
History of peripheral arterial disease status post bilateral iliac PTA and stenting remotely with 3 stents in 2007 by myself. He's also had right renal artery PTA and stenting. These have been followed by duplex ultrasound in our office. He denies claudication.

## 2015-03-02 NOTE — Progress Notes (Signed)
03/02/2015 [name]   1945-12-08  [id number]  Primary Physician Reita Cliche, MD Primary Cardiologist: Lorretta Harp MD Renae Gloss   HPI:  The patient is a very pleasant 69 year old, moderately overweight, divorced, Caucasian male father of 2, grandfather of 1 grandchild who I saw 06/20/14. He has a history of CAD status post coronary artery bypass grafting x7 November 16, 1996. His other problems include PVOD status post bilateral iliac artery PTA and stenting by myself remotely with restenting in 2007. He has had right renal artery PTA and stenting as well. His other problems include hypertension, hyperlipidemia, non-insulin-requiring diabetes. He does have paroxysmal atrial fibrillation and has undergone multiple DC cardioversions as well as atrial fibrillation ablations by Dr. Samara Deist at [facility], most recently in May of last year, though he is now back in atrial fibrillation/flutter on a daily basis, which he is aware of but not symptomatic from. He is active and works out on a treadmill every day. He denies chest pain or shortness of breath. His last stress test performed one year ago which revealed an inferolateral scar without ischemia.  Since I saw him at 9 months ago has remained clinically stable. He denies chest pain, shortness of breath or claudication. He works out on a treadmill or plays golf daily. He is a Myoview stress test performed in October 2014 that showed inferolateral scar without ischemia unchanged from prior studies. Recent arterial Doppler studies of his lower extremities revealed ABIs in the 0.9 range bilaterally with patent iliac stents.he's noticed tachycardia over the last several months which on EKG yesterday is suggested A. Fib with RVR. He has been on Coumadin anticoagulation and has been therapeutic. I'm going to arrange for him to undergo outpatient DC cardioversion followed by a referral to Dr. Rayann Heman.   Current Outpatient Prescriptions    Medication Sig Dispense Refill  . atorvastatin (LIPITOR) 10 MG tablet TAKE 1 TABLET BY MOUTH EVERY DAY 90 tablet 3  . clopidogrel (PLAVIX) 75 MG tablet TAKE 1 TABLET EVERY DAY 90 tablet 3  . Coenzyme Q10 (COQ-10) 100 MG CAPS Take 1 capsule by mouth daily.    Javier Docker Oil 300 MG CAPS 1 cap by mouth daily (Omega 3 Krill oil) '300mg'$     . lisinopril-hydrochlorothiazide (PRINZIDE,ZESTORETIC) 10-12.5 MG per tablet Take 1 tablet by mouth daily. Take as directed. 90 tablet 3  . metoprolol succinate (TOPROL-XL) 100 MG 24 hr tablet TAKE 1 TABLET EVERY DAY 90 tablet 3  . Multiple Vitamin tablet Take 1 tablet by mouth daily.    . niacin (NIASPAN) 1000 MG CR tablet TAKE 1 TABLET BY MOUTH AT BEDTIME 90 tablet 3  . NON FORMULARY at bedtime. CPAP    . warfarin (COUMADIN) 5 MG tablet Take 1-1.5 by mouth every day as directed. 110 tablet 1   No current facility-administered medications for this visit.    No Known Allergies  History   Social History  . Marital Status: Divorced    Spouse Name: N/A  . Number of Children: N/A  . Years of Education: N/A   Occupational History  . Not on file.   Social History Main Topics  . Smoking status: Former Smoker    Quit date: 09/01/1994  . Smokeless tobacco: Never Used  . Alcohol Use: 0.6 - 1.2 oz/week    1-2 Glasses of wine per week  . Drug Use: No  . Sexual Activity: Not on file   Other Topics Concern  . Not on  file   Social History Narrative     Review of Systems: General: negative for chills, fever, night sweats or weight changes.  Cardiovascular: negative for chest pain, dyspnea on exertion, edema, orthopnea, palpitations, paroxysmal nocturnal dyspnea or shortness of breath Dermatological: negative for rash Respiratory: negative for cough or wheezing Urologic: negative for hematuria Abdominal: negative for nausea, vomiting, diarrhea, bright red blood per rectum, melena, or hematemesis Neurologic: negative for visual changes, syncope, or  dizziness All other systems reviewed and are otherwise negative except as noted above.    Blood pressure 110/88, pulse 118, height '6\' 1"'$  (1.854 m), weight 228 lb 14.4 oz (103.828 kg).  General appearance: alert and no distress Neck: no adenopathy, no carotid bruit, no JVD, supple, symmetrical, trachea midline and thyroid not enlarged, symmetric, no tenderness/mass/nodules Lungs: clear to auscultation bilaterally Heart: irregularly irregular rhythm Extremities: extremities normal, atraumatic, no cyanosis or edema  EKG atrial fibrillation with a ventricular response of 118 right bundle branch block. I personally reviewed this EKG  ASSESSMENT AND PLAN:   Status post coronary artery bypass grafting x 7, 11/16/1996 History of CAD status post coronary artery bypass grafting 11/16/96 with 7 grafts placed at that time. His last Myoview stress test performed in October 2014 showed inferolateral scar without ischemia unchanged from prior studies. He denies chest pain or shortness of breath.  Peripheral artery disease History of peripheral arterial disease status post bilateral iliac PTA and stenting remotely with 3 stents in 2007 by myself. He's also had right renal artery PTA and stenting. These have been followed by duplex ultrasound in our office. He denies claudication.  Paroxysmal atrial fibrillation History of paroxysmal atrial fibrillation status post A. Fib ablation by Dr. Lurene Shadow at [facility] twice in the past.he is on Coumadin anticoagulation and has been therapeutic. He's been in what sounds like A. Fib with RVR for the last several months but rates in the 120 range. I'm going to arrange for him to undergo outpatient DC cardioversion followed by a referral to Dr. Rayann Heman for further evaluation.  Hyperlipidemia History of hyperlipidemia on atorvastatin 10 mg a day followed by his PCP  Essential hypertension History of hypertension with blood pressure measured at  110/88. He is on lisinopril, hydrochlorothiazide, and metoprolol. Continue current meds at current dosing      Lorretta Harp MD [facility], [facility] 03/02/2015 12:32 PM

## 2015-03-02 NOTE — Assessment & Plan Note (Signed)
History of CAD status post coronary artery bypass grafting 11/16/96 with 7 grafts placed at that time. His last Myoview stress test performed in October 2014 showed inferolateral scar without ischemia unchanged from prior studies. He denies chest pain or shortness of breath.

## 2015-03-09 LAB — CBC
HCT: 51.4 % (ref 39.0–52.0)
Hemoglobin: 17.5 g/dL — ABNORMAL HIGH (ref 13.0–17.0)
MCH: 33 pg (ref 26.0–34.0)
MCHC: 34 g/dL (ref 30.0–36.0)
MCV: 96.8 fL (ref 78.0–100.0)
MPV: 10.4 fL (ref 8.6–12.4)
Platelets: 176 10*3/uL (ref 150–400)
RBC: 5.31 MIL/uL (ref 4.22–5.81)
RDW: 14.7 % (ref 11.5–15.5)
WBC: 6.8 10*3/uL (ref 4.0–10.5)

## 2015-03-10 LAB — BASIC METABOLIC PANEL
BUN: 15 mg/dL (ref 6–23)
CO2: 30 mEq/L (ref 19–32)
Calcium: 9.5 mg/dL (ref 8.4–10.5)
Chloride: 98 mEq/L (ref 96–112)
Creat: 1.14 mg/dL (ref 0.50–1.35)
Glucose, Bld: 163 mg/dL — ABNORMAL HIGH (ref 70–99)
Potassium: 4.1 mEq/L (ref 3.5–5.3)
Sodium: 138 mEq/L (ref 135–145)

## 2015-03-13 ENCOUNTER — Other Ambulatory Visit: Payer: Self-pay | Admitting: *Deleted

## 2015-03-13 DIAGNOSIS — I4891 Unspecified atrial fibrillation: Secondary | ICD-10-CM

## 2015-03-15 ENCOUNTER — Encounter (HOSPITAL_COMMUNITY)
Admission: RE | Disposition: A | Discharge status: Home / Self Care | Payer: Self-pay | Source: Ambulatory Visit | Attending: Cardiovascular Disease

## 2015-03-15 ENCOUNTER — Ambulatory Visit (HOSPITAL_COMMUNITY): Payer: Medicare Other | Admitting: Anesthesiology

## 2015-03-15 ENCOUNTER — Encounter (HOSPITAL_COMMUNITY): Payer: Self-pay | Admitting: Anesthesiology

## 2015-03-15 ENCOUNTER — Telehealth: Payer: Self-pay | Admitting: Cardiovascular Disease

## 2015-03-15 ENCOUNTER — Ambulatory Visit (HOSPITAL_COMMUNITY)
Admission: RE | Admit: 2015-03-15 | Discharge: 2015-03-15 | Disposition: A | Discharge status: Home / Self Care | Payer: Medicare Other | Source: Ambulatory Visit | Attending: Cardiovascular Disease | Admitting: Cardiovascular Disease

## 2015-03-15 DIAGNOSIS — E119 Type 2 diabetes mellitus without complications: Secondary | ICD-10-CM | POA: Insufficient documentation

## 2015-03-15 DIAGNOSIS — I4891 Unspecified atrial fibrillation: Secondary | ICD-10-CM | POA: Diagnosis not present

## 2015-03-15 DIAGNOSIS — Z87891 Personal history of nicotine dependence: Secondary | ICD-10-CM | POA: Insufficient documentation

## 2015-03-15 DIAGNOSIS — I4819 Other persistent atrial fibrillation: Secondary | ICD-10-CM | POA: Insufficient documentation

## 2015-03-15 DIAGNOSIS — I10 Essential (primary) hypertension: Secondary | ICD-10-CM | POA: Insufficient documentation

## 2015-03-15 DIAGNOSIS — I4811 Longstanding persistent atrial fibrillation: Secondary | ICD-10-CM | POA: Insufficient documentation

## 2015-03-15 HISTORY — PX: CARDIOVERSION: SHX1299

## 2015-03-15 LAB — PROTIME-INR
INR: 2.02 — ABNORMAL HIGH (ref 0.00–1.49)
Prothrombin Time: 22.8 seconds — ABNORMAL HIGH (ref 11.6–15.2)

## 2015-03-15 SURGERY — CARDIOVERSION
Anesthesia: Monitor Anesthesia Care

## 2015-03-15 MED ORDER — PROPOFOL 10 MG/ML IV BOLUS
INTRAVENOUS | Status: DC | PRN
  Administered 2015-03-15: 90 mg via INTRAVENOUS

## 2015-03-15 MED ORDER — SODIUM CHLORIDE 0.9 % IV SOLN
INTRAVENOUS | Status: DC
  Administered 2015-03-15: 11:00:00 via INTRAVENOUS

## 2015-03-15 NOTE — Transfer of Care (Signed)
Immediate Anesthesia Transfer of Care Note  Patient: [name]  Procedure(s) Performed: Procedure(s): CARDIOVERSION (N/A)  Patient Location: PACU and Endoscopy Unit  Anesthesia Type:General  Level of Consciousness: awake, alert , oriented and sedated  Airway & Oxygen Therapy: Patient Spontanous Breathing and Patient connected to nasal cannula oxygen  Post-op Assessment: Report given to RN, Post -op Vital signs reviewed and stable and Patient moving all extremities  Post vital signs: Reviewed and stable  Last Vitals:  Filed Vitals:   03/15/15 1227  BP:   Resp: 22    Complications: No apparent anesthesia complications

## 2015-03-15 NOTE — Telephone Encounter (Signed)
This encounter was opened in error.

## 2015-03-15 NOTE — CV Procedure (Signed)
     Procedure: Electrical Cardioversion Indications:  Atrial Fibrillation  Procedure Details:  Consent: Risks of procedure as well as the alternatives and risks of each were explained to the (patient/caregiver).  Consent for procedure obtained.  Time Out: Verified patient identification, verified procedure, site/side was marked, verified correct patient position, special equipment/implants available, medications/allergies/relevent history reviewed, required imaging and test results available.  Performed  Patient placed on cardiac monitor, pulse oximetry, supplemental oxygen as necessary.  Sedation given: propofol Pacer pads placed anterior and posterior chest.  Cardioverted 1 time(s).  Cardioverted at 120J.  Evaluation: Findings: Post procedure EKG shows: NSR Complications: None Patient did tolerate procedure well.  Time Spent Directly with the Patient:  50mnutes   BQuay Burow7/14/2016, 12:25 PM

## 2015-03-15 NOTE — Anesthesia Postprocedure Evaluation (Signed)
  Anesthesia Post-op Note  Patient: [name]  Procedure(s) Performed: Procedure(s): CARDIOVERSION (N/A)  Patient Location: Endoscopy Unit  Anesthesia Type:General  Level of Consciousness: awake, alert  and oriented  Airway and Oxygen Therapy: Patient Spontanous Breathing  Post-op Pain: none  Post-op Assessment: Post-op Vital signs reviewed, Patient's Cardiovascular Status Stable, Respiratory Function Stable, Patent Airway and Pain level controlled              Post-op Vital Signs: stable  Last Vitals:  Filed Vitals:   03/15/15 1255  BP: 100/67  Pulse: 97  Resp: 19    Complications: No apparent anesthesia complications

## 2015-03-15 NOTE — Anesthesia Preprocedure Evaluation (Addendum)
Anesthesia Evaluation  Patient identified by MRN, date of birth, ID band Patient awake    Reviewed: Allergy & Precautions, NPO status , Patient's Chart, lab work & pertinent test results  Airway Mallampati: II  TM Distance: >3 FB Neck ROM: Full    Dental  (+) Teeth Intact   Pulmonary former smoker,  breath sounds clear to auscultation        Cardiovascular hypertension, Rhythm:Irregular Rate:Normal     Neuro/Psych    GI/Hepatic   Endo/Other  diabetes  Renal/GU      Musculoskeletal   Abdominal   Peds  Hematology   Anesthesia Other Findings   Reproductive/Obstetrics                            Anesthesia Physical Anesthesia Plan  ASA: III  Anesthesia Plan: General   Post-op Pain Management:    Induction: Intravenous  Airway Management Planned: Mask  Additional Equipment:   Intra-op Plan:   Post-operative Plan:   Informed Consent: I have reviewed the patients History and Physical, chart, labs and discussed the procedure including the risks, benefits and alternatives for the proposed anesthesia with the patient or authorized representative who has indicated his/her understanding and acceptance.     Plan Discussed with: CRNA and Anesthesiologist  Anesthesia Plan Comments:         Anesthesia Quick Evaluation

## 2015-03-15 NOTE — Discharge Instructions (Signed)

## 2015-03-16 ENCOUNTER — Encounter (HOSPITAL_COMMUNITY): Payer: Self-pay | Admitting: Cardiovascular Disease

## 2015-03-27 ENCOUNTER — Telehealth: Payer: Self-pay

## 2015-03-27 NOTE — Telephone Encounter (Signed)
LMTCO ,,,TO GET INFORMATION FOR CHART....[name]

## 2015-03-28 ENCOUNTER — Encounter: Payer: Self-pay | Admitting: Cardiovascular Disease

## 2015-03-28 ENCOUNTER — Ambulatory Visit (INDEPENDENT_AMBULATORY_CARE_PROVIDER_SITE_OTHER): Payer: Medicare Other | Admitting: Cardiovascular Disease

## 2015-03-28 VITALS — BP 116/74 | HR 79 | Ht 72.0 in | Wt 225.9 lb

## 2015-03-28 DIAGNOSIS — I10 Essential (primary) hypertension: Secondary | ICD-10-CM

## 2015-03-28 DIAGNOSIS — I4891 Unspecified atrial fibrillation: Secondary | ICD-10-CM | POA: Diagnosis not present

## 2015-03-28 NOTE — Assessment & Plan Note (Signed)
History of multiple ablations in the past Dr. Lurene Shadow at [facility]. He is symptom he was symptomatic in A. Fib and I performed outpatient cardioversion on him 03/15/15 with 1 shock (120 J).he is on Coumadin and hydration and has been therapeutic. She was quickly improved. She played golf last week. I'm referring him to Dr. Rayann Heman for consideration of re-ablation. I will see him back in 3 months

## 2015-03-28 NOTE — Patient Instructions (Signed)
Your physician wants you to follow-up in: 3 months with Dr Gwenlyn Found. You will receive a reminder letter in the mail two months in advance. If you don't receive a letter, please call our office to schedule the follow-up appointment.

## 2015-03-28 NOTE — Progress Notes (Signed)
[name] returns today for post cardioversion follow-up. He had PAF for which he was symptomatic. I cardioverted him on 03/15/15 with 1 shock at 120 J. He feels clinically improved. He is on Coumadin anticoagulation. He has appointment to see Dr. Rayann Heman in one week to discuss potential re-ablation. I will see him back in 3 months.

## 2015-04-04 ENCOUNTER — Ambulatory Visit (INDEPENDENT_AMBULATORY_CARE_PROVIDER_SITE_OTHER): Payer: Medicare Other | Admitting: Internal Medicine

## 2015-04-04 ENCOUNTER — Ambulatory Visit (INDEPENDENT_AMBULATORY_CARE_PROVIDER_SITE_OTHER): Payer: Medicare Other | Admitting: Surgery

## 2015-04-04 ENCOUNTER — Encounter: Payer: Self-pay | Admitting: Internal Medicine

## 2015-04-04 VITALS — BP 118/80 | HR 86 | Ht 73.0 in | Wt 225.6 lb

## 2015-04-04 DIAGNOSIS — I739 Peripheral vascular disease, unspecified: Secondary | ICD-10-CM | POA: Diagnosis not present

## 2015-04-04 DIAGNOSIS — I2583 Coronary atherosclerosis due to lipid rich plaque: Secondary | ICD-10-CM

## 2015-04-04 DIAGNOSIS — Z7901 Long term (current) use of anticoagulants: Secondary | ICD-10-CM | POA: Diagnosis not present

## 2015-04-04 DIAGNOSIS — I4891 Unspecified atrial fibrillation: Secondary | ICD-10-CM

## 2015-04-04 DIAGNOSIS — I480 Paroxysmal atrial fibrillation: Secondary | ICD-10-CM

## 2015-04-04 DIAGNOSIS — I2510 Atherosclerotic heart disease of native coronary artery without angina pectoris: Secondary | ICD-10-CM | POA: Diagnosis not present

## 2015-04-04 LAB — POCT INR: INR: 2

## 2015-04-04 MED ORDER — DRONEDARONE HCL 400 MG PO TABS: 400.0000 mg | ORAL_TABLET | Freq: Two times a day (BID) | ORAL | Status: DC

## 2015-04-04 MED ORDER — RIVAROXABAN 20 MG PO TABS: 20.0000 mg | ORAL_TABLET | Freq: Every day | ORAL | Status: DC

## 2015-04-04 NOTE — Patient Instructions (Signed)
Medication Instructions:  Your physician has recommended you make the following change in your medication:  1) Stop Warfarin 2) Start Xarelto 20 mg daily 3) Start Multaq 400 mg twice daily in 2 weeks( 04/18/15)   Labwork: None ordered  Testing/Procedures: Your physician has requested that you have an echocardiogram. Echocardiography is a painless test that uses sound waves to create images of your heart. It provides your doctor with information about the size and shape of your heart and how well your heart's chambers and valves are working. This procedure takes approximately one hour. There are no restrictions for this procedure.   Follow-Up: Your physician recommends that you schedule a follow-up appointment in: 4 weeks with Roderic Palau, NP in afib clinic   Any Other Special Instructions Will Be Listed Below (If Applicable).

## 2015-04-04 NOTE — Progress Notes (Signed)
Electrophysiology Office Note   Date:  04/04/2015   ID:  [name], DOB 1946-07-04, MRN [id number]  PCP:  Reita Cliche, MD  Cardiologist:  Dr Gwenlyn Found Primary Electrophysiologist: Thompson Grayer, MD    Chief Complaint  Patient presents with  . Atrial Fibrillation     History of Present Illness: [name] is a 69 y.o. male who presents today for electrophysiology evaluation.   He reports initially being diagnosed with atrial flutter 05/2008 after presenting with acute onset tachypalpitations.  He thinks that this was brought on by a dream.  He was treated with diltiazem and warfarin.  He reports increasing frequency and duration of atrial fibrillation.  He required cardioversion 2009.   He was started on amiodarone 07/2008.  This seemed to be beneficial however 2 years later his LFTs became elevated and PFTs became abnormal.  He was evaluated at [facility] for ablation.  He underwent ablation 02/01/2010 at [facility] (referred by Dr Gwenlyn Found). This was complicated by pseudoaneurysm.  He continued to have afib and therefore underwent repeat ablation 12/31/2010.  He did well until February when he developed recurrent atrial arrhythmias.  He was evaluated by Dr Gwenlyn Found and noted to have atypical atrial flutter.  He underwent cardioversion 03/15/2015.  He has done well since that time.  He has occasional "skipped beats" but otherwise feels "fine" now.  He reports improved exercise tolerance/ SOB with cardioversion. Today, he denies symptoms of chest pain, shortness of breath, orthopnea, PND, lower extremity edema, claudication, dizziness, presyncope, syncope, bleeding, or neurologic sequela. The patient is tolerating medications without difficulties and is otherwise without complaint today.    Past Medical History  Diagnosis Date  . Coronary artery disease   . S/P coronary artery bypass graft x 7 11/16/96  . Peripheral artery disease     pseudoaneurysm post afib ablation at [facility], s/p bilateral iliac  stents  . HTN (hypertension)   . HLD (hyperlipidemia)   . DM2 (diabetes mellitus, type 2)     diet controlled  . Atrial fibrillation   . Renal artery stenosis     right renal artery PTA and stenting  . OSA (obstructive sleep apnea)     uses CPAP   Past Surgical History  Procedure Laterality Date  . Atrial fibrillation ablation  03/27/2010, 12/31/2010    [facility], Dr. Nadeen Landau  . Cardiac stress test  11/21/2009    Exercise capacity 5 METS. No significant ischemia demonstrated  . 2-d echocardiogram  03/25/2010    Normal left ventricular function. Mild MR, TR, trivial AR  . Tooth extraction  05/27/13    tooth extraction with bone graft  . Coronary artery bypass graft  1998  . Cardioversion N/A 03/15/2015    Procedure: CARDIOVERSION;  Surgeon: Lorretta Harp, MD;  Location: [facility] ENDOSCOPY;  Service: Cardiovascular;  Laterality: N/A;  . Orchiectomy  1981    for cancer  . Back surgery  2007     Current Outpatient Prescriptions  Medication Sig Dispense Refill  . atorvastatin (LIPITOR) 10 MG tablet TAKE 1 TABLET BY MOUTH EVERY DAY (Patient taking differently: TAKE 1 TABLET BY MOUTH DAILY AT BEDTIME) 90 tablet 3  . clopidogrel (PLAVIX) 75 MG tablet Take 75 mg by mouth at bedtime.    . Coenzyme Q10 (COQ-10) 100 MG CAPS Take 1 capsule by mouth daily.    . furosemide (LASIX) 40 MG tablet Take 40 mg by mouth daily as needed (swelling).    Javier Docker Oil 300 MG CAPS  1 cap by mouth daily (Omega 3 Krill oil) '300mg'$     . lisinopril-hydrochlorothiazide (PRINZIDE,ZESTORETIC) 10-12.5 MG per tablet Take 1 tablet by mouth daily. Take as directed. (Patient taking differently: Take 1 tablet by mouth at bedtime. Take as directed.) 90 tablet 3  . metoprolol succinate (TOPROL-XL) 100 MG 24 hr tablet Take 100 mg by mouth daily. Take with or immediately following a meal.    . Multiple Vitamin tablet Take 1 tablet by mouth daily.    . niacin (NIASPAN) 1000 MG CR tablet TAKE 1 TABLET BY MOUTH AT BEDTIME 90 tablet 3  .  NON FORMULARY CPAP at night    . warfarin (COUMADIN) 5 MG tablet Take 1-1.5 by mouth every day as directed. (Patient taking differently: Take 5-7.5 mg by mouth daily at 6 PM. Take 5 mg by mouth daily on all days except Sunday. Take 7.5 mg by mouth on Sunday.) 110 tablet 1   No current facility-administered medications for this visit.    Allergies:   Review of patient's allergies indicates no known allergies.   Social History:  The patient  reports that he quit smoking about 20 years ago. He has never used smokeless tobacco. He reports that he drinks about 0.6 - 1.2 oz of alcohol per week. He reports that he does not use illicit drugs.   Family History:  The patient's  family history includes Cancer in his father and mother; Leukemia (age of onset: 67) in his daughter; Other in his brother and son.    ROS:  Please see the history of present illness.   All other systems are reviewed and negative.    PHYSICAL EXAM: VS:  BP 118/80 mmHg  Pulse 86  Ht '6\' 1"'$  (1.854 m)  Wt 102.331 kg (225 lb 9.6 oz)  BMI 29.77 kg/m2 , BMI Body mass index is 29.77 kg/(m^2). GEN: Well nourished, well developed, in no acute distress HEENT: normal Neck: no JVD, carotid bruits, or masses Cardiac: RRR with ectopy; no murmurs, rubs, or gallops,no edema  Respiratory:  clear to auscultation bilaterally, normal work of breathing GI: soft, nontender, nondistended, + BS MS: no deformity or atrophy Skin: warm and dry  Neuro:  Strength and sensation are intact Psych: euthymic mood, full affect  EKG:  EKG is ordered today. The ekg ordered today shows sinus rhythm with PACs, nonsustained atach, RBBB, nonspecific St/T changes   Recent Labs: 03/09/2015: BUN 15; Creat 1.14; Hemoglobin 17.5*; Platelets 176; Potassium 4.1; Sodium 138    Lipid Panel     Component Value Date/Time   CHOL 136 06/13/2013 0814   TRIG 181* 06/13/2013 0814   HDL 38* 06/13/2013 0814   CHOLHDL 3.6 06/13/2013 0814   VLDL 36 06/13/2013 0814    LDLCALC 62 06/13/2013 0814     Wt Readings from Last 3 Encounters:  04/04/15 102.331 kg (225 lb 9.6 oz)  03/28/15 102.468 kg (225 lb 14.4 oz)  03/02/15 103.828 kg (228 lb 14.4 oz)      Other studies Reviewed: Additional studies/ records that were reviewed today include: Dr Kennon Holter notes   ASSESSMENT AND PLAN:  1.  Persistent atrial fibrillation/ atypical atrial flutter The patient has symptomatic atrial arrhythias though he had done very well for several years post ablation.  Recently, his atrial arrhythmia burden has been increased.  Therapeutic strategies for afib/ atypical flutter including medicine and ablation were discussed in detail with the patient today. Risk, benefits, and alternatives to EP study and radiofrequency ablation for afib were also  discussed in detail today. At this time, he would like to avoid procedures. AAD options were discussed including multaq, sotalol, and tikosyn.  He would like to try multaq. I will therefore start multaq '400mg'$  BID Obtain an echo to evaluate for structural heart changed related to AF Today, I discussed Coumadin as well as novel anticoagulants including Pradaxa, Xarelto, Savaysa, and Eliquis today as indicated for risk reduction in stroke and systemic emboli with nonvalvular atrial fibrillation.  Risks, benefits, and alternatives to each of these drugs were discussed at length today.  He would like to switch from coumadin to xarelto.  I will therefore start xarelto '20mg'$  daily at this time.  2. CAD/PVD No symptoms Would like to stop plavix if possible but will defer to Dr Gwenlyn Found given h/o PVD/CAD  3. OSA Compliance with CPAP is encouraged  Follow-up with Roderic Palau NP in the AF clinic in 4 weeks and then every 3 months Follow-up with Dr Gwenlyn Found as scheduled Will need LFTs on multaq twice per year I will see when needed  Current medicines are reviewed at length with the patient today.   The patient does not have concerns regarding his  medicines.  The following changes were made today:  none  Signed, Thompson Grayer, MD  04/04/2015 9:15 AM     [facility] [address] [place] [address] [phone] (office) [phone] (fax)

## 2015-04-11 ENCOUNTER — Ambulatory Visit: Payer: Medicare Other | Admitting: Pharmacist Clinician (PhC)/ Clinical Pharmacy Specialist

## 2015-04-12 ENCOUNTER — Encounter: Payer: Self-pay | Admitting: Cardiovascular Disease

## 2015-04-13 ENCOUNTER — Other Ambulatory Visit (HOSPITAL_COMMUNITY): Payer: Medicare Other

## 2015-04-17 ENCOUNTER — Ambulatory Visit (HOSPITAL_COMMUNITY): Payer: Medicare Other | Attending: Cardiovascular Disease

## 2015-04-17 ENCOUNTER — Other Ambulatory Visit: Payer: Self-pay

## 2015-04-17 DIAGNOSIS — E119 Type 2 diabetes mellitus without complications: Secondary | ICD-10-CM | POA: Insufficient documentation

## 2015-04-17 DIAGNOSIS — I071 Rheumatic tricuspid insufficiency: Secondary | ICD-10-CM | POA: Diagnosis not present

## 2015-04-17 DIAGNOSIS — E785 Hyperlipidemia, unspecified: Secondary | ICD-10-CM | POA: Insufficient documentation

## 2015-04-17 DIAGNOSIS — I517 Cardiomegaly: Secondary | ICD-10-CM | POA: Diagnosis not present

## 2015-04-17 DIAGNOSIS — I10 Essential (primary) hypertension: Secondary | ICD-10-CM | POA: Diagnosis not present

## 2015-04-17 DIAGNOSIS — I480 Paroxysmal atrial fibrillation: Secondary | ICD-10-CM | POA: Diagnosis not present

## 2015-05-02 ENCOUNTER — Ambulatory Visit (INDEPENDENT_AMBULATORY_CARE_PROVIDER_SITE_OTHER): Payer: Medicare Other | Admitting: Pharmacist

## 2015-05-02 DIAGNOSIS — I4891 Unspecified atrial fibrillation: Secondary | ICD-10-CM | POA: Diagnosis not present

## 2015-05-02 DIAGNOSIS — Z7901 Long term (current) use of anticoagulants: Secondary | ICD-10-CM | POA: Diagnosis not present

## 2015-05-02 DIAGNOSIS — I480 Paroxysmal atrial fibrillation: Secondary | ICD-10-CM | POA: Diagnosis not present

## 2015-05-02 LAB — CBC
HCT: 47.8 % (ref 39.0–52.0)
Hemoglobin: 16.4 g/dL (ref 13.0–17.0)
MCHC: 34.2 g/dL (ref 30.0–36.0)
MCV: 96.5 fl (ref 78.0–100.0)
Platelets: 221 10*3/uL (ref 150.0–400.0)
RBC: 4.96 Mil/uL (ref 4.22–5.81)
RDW: 14.7 % (ref 11.5–15.5)
WBC: 9.5 10*3/uL (ref 4.0–10.5)

## 2015-05-02 LAB — BASIC METABOLIC PANEL
BUN: 12 mg/dL (ref 6–23)
CO2: 29 mEq/L (ref 19–32)
Calcium: 9.6 mg/dL (ref 8.4–10.5)
Chloride: 100 mEq/L (ref 96–112)
Creatinine, Ser: 1.05 mg/dL (ref 0.40–1.50)
GFR: 74.87 mL/min (ref >60.00)
Glucose, Bld: 124 mg/dL — ABNORMAL HIGH (ref 70–99)
Potassium: 3.9 mEq/L (ref 3.5–5.1)
Sodium: 136 mEq/L (ref 135–145)

## 2015-05-03 ENCOUNTER — Other Ambulatory Visit: Payer: Self-pay | Admitting: Pharmacist

## 2015-05-03 DIAGNOSIS — I480 Paroxysmal atrial fibrillation: Secondary | ICD-10-CM

## 2015-05-03 MED ORDER — RIVAROXABAN 20 MG PO TABS: 20.0000 mg | ORAL_TABLET | Freq: Every day | ORAL | Status: DC

## 2015-05-03 NOTE — Progress Notes (Signed)
Pt was started on Xarelto for Atrial Fib on April 04, 2015 by Dr. Rayann Heman.  Pt had previously been taking Coumadin.     Reviewed patients medication list.  Pt is not currently on any combined P-gp and strong CYP3A4 inhibitors/inducers (ketoconazole, traconazole, ritonavir, carbamazepine, phenytoin, rifampin, St. John's wort).  Reviewed labs.  SCr 1.05, Weight 102, CrCl- 98 mL/min.  Dose appropriate based on CrCl.   Hgb and HCT Within Normal Limits  A full discussion of the nature of anticoagulants has been carried out.  A benefit/risk analysis has been presented to the patient, so that they understand the justification for choosing anticoagulation with Xarelto at this time.  The need for compliance is stressed.  Pt is aware to take the medication once daily with the largest meal of the day.  Side effects of potential bleeding are discussed, including unusual colored urine or stools, coughing up blood or coffee ground emesis, nose bleeds or serious fall or head trauma.  Discussed signs and symptoms of stroke. The patient should avoid any OTC items containing aspirin or ibuprofen.  Avoid alcohol consumption.   Call if any signs of abnormal bleeding.  Discussed financial obligations and resolved any difficulty in obtaining medication.  He would like to change to a 90 day supply now.  New Rx sent to pharmacy.  Next lab test test in 6 months.

## 2015-05-09 ENCOUNTER — Ambulatory Visit (HOSPITAL_COMMUNITY)
Admission: RE | Admit: 2015-05-09 | Discharge: 2015-05-09 | Disposition: A | Discharge status: Home / Self Care | Payer: Medicare Other | Source: Ambulatory Visit | Attending: Nurse Practitioner | Admitting: Nurse Practitioner

## 2015-05-09 ENCOUNTER — Encounter (HOSPITAL_COMMUNITY): Payer: Self-pay | Admitting: Nurse Practitioner

## 2015-05-09 VITALS — BP 118/80 | HR 90 | Ht 73.0 in | Wt 225.8 lb

## 2015-05-09 DIAGNOSIS — I739 Peripheral vascular disease, unspecified: Secondary | ICD-10-CM | POA: Insufficient documentation

## 2015-05-09 DIAGNOSIS — Z7901 Long term (current) use of anticoagulants: Secondary | ICD-10-CM | POA: Diagnosis not present

## 2015-05-09 DIAGNOSIS — I484 Atypical atrial flutter: Secondary | ICD-10-CM | POA: Insufficient documentation

## 2015-05-09 DIAGNOSIS — I2510 Atherosclerotic heart disease of native coronary artery without angina pectoris: Secondary | ICD-10-CM | POA: Insufficient documentation

## 2015-05-09 DIAGNOSIS — I480 Paroxysmal atrial fibrillation: Secondary | ICD-10-CM

## 2015-05-09 DIAGNOSIS — I481 Persistent atrial fibrillation: Secondary | ICD-10-CM | POA: Insufficient documentation

## 2015-05-09 DIAGNOSIS — G4733 Obstructive sleep apnea (adult) (pediatric): Secondary | ICD-10-CM | POA: Diagnosis not present

## 2015-05-09 DIAGNOSIS — Z87891 Personal history of nicotine dependence: Secondary | ICD-10-CM | POA: Insufficient documentation

## 2015-05-09 LAB — HEPATIC FUNCTION PANEL
ALT: 16 U/L — ABNORMAL LOW (ref 17–63)
AST: 27 U/L (ref 15–41)
Albumin: 4 g/dL (ref 3.5–5.0)
Alkaline Phosphatase: 63 U/L (ref 38–126)
Bilirubin, Direct: 0.2 mg/dL (ref 0.1–0.5)
Indirect Bilirubin: 0.8 mg/dL (ref 0.3–0.9)
Total Bilirubin: 1 mg/dL (ref 0.3–1.2)
Total Protein: 7.7 g/dL (ref 6.5–8.1)

## 2015-05-09 MED ORDER — DRONEDARONE HCL 400 MG PO TABS: 400.0000 mg | ORAL_TABLET | Freq: Two times a day (BID) | ORAL | Status: DC

## 2015-05-09 NOTE — Progress Notes (Signed)
Patient ID: [name], male   DOB: 1946-05-12, 69 y.o.   MRN: [id number]          Date:  05/09/2015   ID:  [name], DOB 02/13/1946, MRN [id number]  PCP:  Reita Cliche, MD  Cardiologist:  Dr Gwenlyn Found Primary Electrophysiologist: Dr. Rayann Heman  Chief Complaint  Patient presents with  . Atrial Fibrillation     History of Present Illness: [name] is a 69 y.o. male who presents today for f/u in the afib clinic.   He reports initially being diagnosed with atrial flutter 05/2008 after presenting with acute onset tachypalpitations.  He thinks that this was brought on by a dream.  He was treated with diltiazem and warfarin.  He reports increasing frequency and duration of atrial fibrillation.  He required cardioversion 2009.   He was started on amiodarone 07/2008.  This seemed to be beneficial however 2 years later his LFTs became elevated and PFTs became abnormal.  He was evaluated at [facility] for ablation.  He underwent ablation 02/01/2010 at [facility] (referred by Dr Gwenlyn Found). This was complicated by pseudoaneurysm.  He continued to have afib and therefore underwent repeat ablation 12/31/2010.  He did well until February when he developed recurrent atrial arrhythmias.  He was evaluated by Dr Gwenlyn Found and noted to have atypical atrial flutter.  He underwent cardioversion 03/15/2015.  He feels like his afb burden had increased when he saw Dr. Rayann Heman and was started on multaq 400 mg bid and switched from coumadin to xarelto. Echo ordered which showed normal EF.  In the afib clinic today, he states he did have some queasiness when first started taking multaq but this has resolved. He is doing well on xalretlo. Has not noticed any afib. No issues with bleeding.  Today, he denies symptoms of chest pain, shortness of breath, orthopnea, PND, lower extremity edema, claudication, dizziness, presyncope, syncope, bleeding, or neurologic sequela. The patient is tolerating medications without difficulties and is  otherwise without complaint today.    Past Medical History  Diagnosis Date  . Coronary artery disease   . S/P coronary artery bypass graft x 7 11/16/96  . Peripheral artery disease     pseudoaneurysm post afib ablation at [facility], s/p bilateral iliac stents  . HTN (hypertension)   . HLD (hyperlipidemia)   . DM2 (diabetes mellitus, type 2)     diet controlled  . Atrial fibrillation   . Renal artery stenosis     right renal artery PTA and stenting  . OSA (obstructive sleep apnea)     uses CPAP   Past Surgical History  Procedure Laterality Date  . Atrial fibrillation ablation  03/27/2010, 12/31/2010    [facility], Dr. Nadeen Landau  . Cardiac stress test  11/21/2009    Exercise capacity 5 METS. No significant ischemia demonstrated  . 2-d echocardiogram  03/25/2010    Normal left ventricular function. Mild MR, TR, trivial AR  . Tooth extraction  05/27/13    tooth extraction with bone graft  . Coronary artery bypass graft  1998  . Cardioversion N/A 03/15/2015    Procedure: CARDIOVERSION;  Surgeon: Lorretta Harp, MD;  Location: [facility] ENDOSCOPY;  Service: Cardiovascular;  Laterality: N/A;  . Orchiectomy  1981    for cancer  . Back surgery  2007     Current Outpatient Prescriptions  Medication Sig Dispense Refill  . atorvastatin (LIPITOR) 10 MG tablet TAKE 1 TABLET BY MOUTH EVERY DAY (Patient taking differently: TAKE 1 TABLET BY MOUTH  DAILY AT BEDTIME) 90 tablet 3  . clopidogrel (PLAVIX) 75 MG tablet Take 75 mg by mouth at bedtime.    . Coenzyme Q10 (COQ-10) 100 MG CAPS Take 1 capsule by mouth daily.    [name] dronedarone (MULTAQ) 400 MG tablet Take 1 tablet (400 mg total) by mouth 2 (two) times daily with a meal. 180 tablet 3  . furosemide (LASIX) 40 MG tablet Take 40 mg by mouth daily as needed (swelling).    Javier Docker Oil 300 MG CAPS 1 cap by mouth daily (Omega 3 Krill oil) '300mg'$     . lisinopril-hydrochlorothiazide (PRINZIDE,ZESTORETIC) 10-12.5 MG per tablet Take 1 tablet by mouth daily. Take as  directed. (Patient taking differently: Take 1 tablet by mouth at bedtime. Take as directed.) 90 tablet 3  . metoprolol succinate (TOPROL-XL) 100 MG 24 hr tablet Take 100 mg by mouth daily. Take with or immediately following a meal.    . Multiple Vitamin tablet Take 1 tablet by mouth daily.    . niacin (NIASPAN) 1000 MG CR tablet TAKE 1 TABLET BY MOUTH AT BEDTIME 90 tablet 3  . NON FORMULARY CPAP at night    . rivaroxaban (XARELTO) 20 MG TABS tablet Take 1 tablet (20 mg total) by mouth daily with supper. 90 tablet 3   No current facility-administered medications for this encounter.    Allergies:   Review of patient's allergies indicates no known allergies.   Social History:  The patient  reports that he quit smoking about 20 years ago. He has never used smokeless tobacco. He reports that he drinks about 0.6 - 1.2 oz of alcohol per week. He reports that he does not use illicit drugs.   Family History:  The patient's  family history includes Cancer in his father and mother; Leukemia (age of onset: 75) in his daughter; Other in his brother and son.    ROS:  Please see the history of present illness.   All other systems are reviewed and negative.    PHYSICAL EXAM: VS:  BP 118/80 mmHg  Pulse 90  Ht '6\' 1"'$  (1.854 m)  Wt 225 lb 12.8 oz (102.422 kg)  BMI 29.80 kg/m2 , BMI Body mass index is 29.8 kg/(m^2). GEN: Well nourished, well developed, in no acute distress HEENT: normal Neck: no JVD, carotid bruits, or masses Cardiac: RRR with ectopy; no murmurs, rubs, or gallops,no edema  Respiratory:  clear to auscultation bilaterally, normal work of breathing GI: soft, nontender, nondistended, + BS MS: no deformity or atrophy Skin: warm and dry  Neuro:  Strength and sensation are intact Psych: euthymic mood, full affect  EKG:  EKG is ordered today. The ekg ordered today shows sinus rhythm with RBBB, Pr int 138 ms, QRS 136 ms, QTc 511 ms. Ekg will be sent to Dr. Rayann Heman for review with prolonged  QTc since the addition of multaq.   Recent Labs: 05/02/2015: BUN 12; Creatinine, Ser 1.05; Hemoglobin 16.4; Platelets 221.0; Potassium 3.9; Sodium 136 05/09/2015: ALT 16*    Lipid Panel     Component Value Date/Time   CHOL 136 06/13/2013 0814   TRIG 181* 06/13/2013 0814   HDL 38* 06/13/2013 0814   CHOLHDL 3.6 06/13/2013 0814   VLDL 36 06/13/2013 0814   LDLCALC 62 06/13/2013 0814     Wt Readings from Last 3 Encounters:  05/09/15 225 lb 12.8 oz (102.422 kg)  04/04/15 225 lb 9.6 oz (102.331 kg)  03/28/15 225 lb 14.4 oz (102.468 kg)  Other studies Reviewed: Epic records reviewed Echo- 8/16 Left ventricle: The cavity size was normal. Wall thickness was increased in a pattern of mild LVH. Systolic function was normal. The estimated ejection fraction was in the range of 50% to 55%. Wall motion was normal; there were no regional wall motion abnormalities. The study is not technically sufficient to allow evaluation of LV diastolic function. - Left atrium: The atrium was mildly dilated. - Right atrium: The atrium was mildly dilated. - Tricuspid valve: There was trivial regurgitation. - Pulmonary arteries: PA peak pressure: 27 mm Hg (S).   ASSESSMENT AND PLAN:  1.  Persistent atrial fibrillation/ atypical atrial flutter Recently, his atrial arrhythmia burden has been increased, but improved with multaq.  Continue multaq '400mg'$  BID Continue xarelto. Liver panel today.  2. CAD/PVD No symptoms Would like to stop plavix if possible but will defer to Dr Gwenlyn Found given h/o PVD/CAD  3. OSA Compliance with CPAP is encouraged  F/u in afib clinic in 3-4 months. Dr. Gwenlyn Found in October Dr. Rayann Heman when needed    Signed, Geroge Baseman. Annalissa Murphey, [facility] [address] [place], [place] [address] [phone]

## 2015-05-16 ENCOUNTER — Ambulatory Visit (HOSPITAL_COMMUNITY)
Admission: RE | Admit: 2015-05-16 | Discharge: 2015-05-16 | Disposition: A | Discharge status: Home / Self Care | Payer: Medicare Other | Source: Ambulatory Visit | Attending: Nurse Practitioner | Admitting: Nurse Practitioner

## 2015-05-16 ENCOUNTER — Other Ambulatory Visit: Payer: Self-pay

## 2015-05-16 DIAGNOSIS — I480 Paroxysmal atrial fibrillation: Secondary | ICD-10-CM | POA: Diagnosis present

## 2015-05-16 NOTE — Progress Notes (Addendum)
Pt's here for f/u EKG. Roderic Palau will review EKG.   Ekg reviewed for QTc, f/u after last visit. When in office last week, QTC was 511 ms. He had been taking some benadryl which he has stopped. QTc improved today at 503. Pt states when he was on amiodarone in the past,  QTc usually ran around 500. Without multaq on board QTc 488 ms.

## 2015-05-30 ENCOUNTER — Institutional Professional Consult (permissible substitution): Payer: Medicare Other | Admitting: Internal Medicine

## 2015-06-20 LAB — HM DIABETES EYE EXAM

## 2015-06-22 ENCOUNTER — Ambulatory Visit: Payer: Medicare Other | Admitting: Cardiovascular Disease

## 2015-06-22 ENCOUNTER — Other Ambulatory Visit: Payer: Self-pay | Admitting: Cardiovascular Disease

## 2015-06-27 ENCOUNTER — Encounter: Payer: Self-pay | Admitting: Cardiovascular Disease

## 2015-06-27 ENCOUNTER — Ambulatory Visit (INDEPENDENT_AMBULATORY_CARE_PROVIDER_SITE_OTHER): Payer: Medicare Other | Admitting: Cardiovascular Disease

## 2015-06-27 VITALS — BP 112/64 | HR 68 | Ht 73.0 in | Wt 226.0 lb

## 2015-06-27 DIAGNOSIS — I10 Essential (primary) hypertension: Secondary | ICD-10-CM | POA: Diagnosis not present

## 2015-06-27 DIAGNOSIS — I480 Paroxysmal atrial fibrillation: Secondary | ICD-10-CM

## 2015-06-27 DIAGNOSIS — E785 Hyperlipidemia, unspecified: Secondary | ICD-10-CM | POA: Diagnosis not present

## 2015-06-27 NOTE — Assessment & Plan Note (Addendum)
History of paroxysmal atrial fibrillation status post A. Fib ablation by Dr. Lurene Shadow at [facility]  In the past on several occasions most recently in May 2015. He had atrial flutter which she was symptomatic from. I performed outpatient DC cardioversion on him 03/15/15 successfully. I then referred him to Dr. Rayann Heman who placed him on Multaq and switched his Coumadin to Xarelto. He feels clinically improved since his cardioversion with more energy.

## 2015-06-27 NOTE — Assessment & Plan Note (Signed)
History of hyperlipidemia on low-dose atorvastatin. He feels that he may be having leg weakness from this and therefore going to give him in 6 weeks statin holiday. If his weakness improves we will change his statin to Pravachol. If not he'll go back on atorvastatin and we will check a lipid and liver profile.

## 2015-06-27 NOTE — Patient Instructions (Addendum)
Medication Instructions:  1) STOP Lipitor for 6 WEEKS - if leg weakness gets better call our office and we will change the medication (Pravastatin '20mg'$ ); if there is no change in leg weakness then you can return to taking Lipitor   Labwork: Your physician recommends that you return for lab work in: FASTING The lab can be found on the FIRST FLOOR of out building in [address]   Testing/Procedures: none  Follow-Up: Your physician wants you to follow-up in: 12 months with Dr. Gwenlyn Found. You will receive a reminder letter in the mail two months in advance. If you don't receive a letter, please call our office to schedule the follow-up appointment.   Any Other Special Instructions Will Be Listed Below (If Applicable).     If you need a refill on your cardiac medications before your next appointment, please call your pharmacy.

## 2015-06-27 NOTE — Assessment & Plan Note (Signed)
history of hypertension with blood pressure measured at 112/64. He is on lisinopril, hydrochlorothiazide and metoprolol. Continue current meds current dosing

## 2015-06-27 NOTE — Progress Notes (Signed)
06/27/2015 [name]   Sep 13, 1945  [id number]  Primary Physician Reita Cliche, MD Primary Cardiologist: Lorretta Harp MD Renae Gloss   HPI:  The patient is a very pleasant 69 year old, moderately overweight, divorced, Caucasian male father of 2, grandfather of 1 grandchild who I saw 03/02/15.[name] He has a history of CAD status post coronary artery bypass grafting x7 November 16, 1996. His other problems include PVOD status post bilateral iliac artery PTA and stenting by myself remotely with restenting in 2007. He has had right renal artery PTA and stenting as well. His other problems include hypertension, hyperlipidemia, non-insulin-requiring diabetes. He does have paroxysmal atrial fibrillation and has undergone multiple DC cardioversions as well as atrial fibrillation ablations by Dr. Samara Deist at [facility], most recently in May of last year, though he is now back in atrial fibrillation/flutter on a daily basis, which he is aware of but not symptomatic from. He is active and works out on a treadmill every day. He denies chest pain or shortness of breath. His last stress test performed one year ago which revealed an inferolateral scar without ischemia.  Since I saw him at 9 months ago has remained clinically stable. He denies chest pain, shortness of breath or claudication. He works out on a treadmill or plays golf daily. He is a Myoview stress test performed in October 2014 that showed inferolateral scar without ischemia unchanged from prior studies. Recent arterial Doppler studies of his lower extremities revealed ABIs in the 0.9 range bilaterally with patent iliac stents.he's noticed tachycardia over the last several months which on EKG yesterday is suggested A. Fib with RVR. He has been on Coumadin anticoagulation and has been therapeutic. I performed outpatient DC cardioversion on him 03/15/15 successfully back to sinus rhythm. He saw Dr. Rayann Heman subsequent to that and was placed on  Multaq antiarrhythmic therapy. His Coumadin was switched to surround toe. He remains in sinus rhythm feeling clinically improved.   Current Outpatient Prescriptions  Medication Sig Dispense Refill  . clopidogrel (PLAVIX) 75 MG tablet Take 75 mg by mouth at bedtime.    . clopidogrel (PLAVIX) 75 MG tablet TAKE 1 TABLET EVERY DAY 90 tablet 3  . Coenzyme Q10 (COQ-10) 100 MG CAPS Take 1 capsule by mouth daily.    [name] dronedarone (MULTAQ) 400 MG tablet Take 1 tablet (400 mg total) by mouth 2 (two) times daily with a meal. 180 tablet 3  . furosemide (LASIX) 40 MG tablet Take 40 mg by mouth daily as needed (swelling).    Javier Docker Oil 300 MG CAPS 1 cap by mouth daily (Omega 3 Krill oil) '300mg'$     . lisinopril-hydrochlorothiazide (PRINZIDE,ZESTORETIC) 10-12.5 MG tablet TAKE 1 TABLET BY MOUTH DAILY. TAKE AS DIRECTED. 90 tablet 3  . metoprolol succinate (TOPROL-XL) 100 MG 24 hr tablet Take 100 mg by mouth daily. Take with or immediately following a meal.    . Multiple Vitamin tablet Take 1 tablet by mouth daily.    . niacin (NIASPAN) 1000 MG CR tablet TAKE 1 TABLET BY MOUTH AT BEDTIME 90 tablet 3  . NON FORMULARY CPAP at night    . rivaroxaban (XARELTO) 20 MG TABS tablet Take 1 tablet (20 mg total) by mouth daily with supper. 90 tablet 3   No current facility-administered medications for this visit.    No Known Allergies  Social History   Social History  . Marital Status: Divorced    Spouse Name: N/A  . Number of Children: N/A  .  Years of Education: N/A   Occupational History  . Not on file.   Social History Main Topics  . Smoking status: Former Smoker    Quit date: 09/01/1994  . Smokeless tobacco: Never Used  . Alcohol Use: 0.6 - 1.2 oz/week    1-2 Glasses of wine per week     Comment: occasional  . Drug Use: No  . Sexual Activity: Not on file   Other Topics Concern  . Not on file   Social History Narrative   Pt lives in [place] alone.  Divorced.   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  Cardiovascular: negative for chest pain, dyspnea on exertion, edema, orthopnea, palpitations, paroxysmal nocturnal dyspnea or shortness of breath Dermatological: negative for rash Respiratory: negative for cough or wheezing Urologic: negative for hematuria Abdominal: negative for nausea, vomiting, diarrhea, bright red blood per rectum, melena, or hematemesis Neurologic: negative for visual changes, syncope, or dizziness All other systems reviewed and are otherwise negative except as noted above.    Blood pressure 112/64, pulse 68, height '6\' 1"'$  (1.854 m), weight 226 lb (102.513 kg).  General appearance: alert and no distress Neck: no adenopathy, no carotid bruit, no JVD, supple, symmetrical, trachea midline and thyroid not enlarged, symmetric, no tenderness/mass/nodules Lungs: clear to auscultation bilaterally Heart: regular rate and rhythm, S1, S2 normal, no murmur, click, rub or gallop Extremities: extremities normal, atraumatic, no cyanosis or edema  EKG normal sinus rhythm at 68 with right bundle-branch block and occasional PVCs. I personally reviewed this EKG.   ASSESSMENT AND PLAN:   Paroxysmal atrial fibrillation History of paroxysmal atrial fibrillation status post A. Fib ablation by Dr. Lurene Shadow at [facility]  In the past on several occasions most recently in May 2015. He had atrial flutter which she was symptomatic from. I performed outpatient DC cardioversion on him 03/15/15 successfully. I then referred him to Dr. Rayann Heman who placed him on Multaq and switched his Coumadin to Xarelto. He feels clinically improved since his cardioversion with more energy.  Hyperlipidemia History of hyperlipidemia on low-dose atorvastatin. He feels that he may be having leg weakness from this and therefore going to give him in 6 weeks statin holiday. If his weakness improves we will change his  statin to Pravachol. If not he'll go back on atorvastatin and we will check a lipid and liver profile.  Essential hypertension history of hypertension with blood pressure measured at 112/64. He is on lisinopril, hydrochlorothiazide and metoprolol. Continue current meds current dosing      Lorretta Harp MD [facility], [facility] 06/27/2015 11:38 AM

## 2015-07-03 LAB — LIPID PANEL
Cholesterol: 103 mg/dL — ABNORMAL LOW (ref 125–200)
HDL: 44 mg/dL (ref >=40)
LDL Cholesterol: 41 mg/dL (ref <130)
Total CHOL/HDL Ratio: 2.3 Ratio (ref <=5.0)
Triglycerides: 90 mg/dL (ref <150)
VLDL: 18 mg/dL (ref <30)

## 2015-07-03 LAB — HEPATIC FUNCTION PANEL
ALT: 10 U/L (ref 9–46)
AST: 17 U/L (ref 10–35)
Albumin: 4 g/dL (ref 3.6–5.1)
Alkaline Phosphatase: 63 U/L (ref 40–115)
Bilirubin, Direct: 0.3 mg/dL — ABNORMAL HIGH (ref <=0.2)
Indirect Bilirubin: 0.7 mg/dL (ref 0.2–1.2)
Total Bilirubin: 1 mg/dL (ref 0.2–1.2)
Total Protein: 6.9 g/dL (ref 6.1–8.1)

## 2015-08-07 ENCOUNTER — Other Ambulatory Visit: Payer: Self-pay | Admitting: Cardiovascular Disease

## 2015-08-07 NOTE — Telephone Encounter (Signed)
Rx(s) sent to pharmacy electronically.  

## 2015-08-11 ENCOUNTER — Encounter: Payer: Self-pay | Admitting: Cardiovascular Disease

## 2015-09-05 ENCOUNTER — Encounter (HOSPITAL_COMMUNITY): Payer: Self-pay | Admitting: Nurse Practitioner

## 2015-09-12 ENCOUNTER — Ambulatory Visit (HOSPITAL_COMMUNITY)
Admission: RE | Admit: 2015-09-12 | Discharge: 2015-09-12 | Disposition: A | Discharge status: Home / Self Care | Payer: Medicare Other | Source: Ambulatory Visit | Attending: Nurse Practitioner | Admitting: Nurse Practitioner

## 2015-09-12 ENCOUNTER — Encounter (HOSPITAL_COMMUNITY): Payer: Self-pay | Admitting: Nurse Practitioner

## 2015-09-12 VITALS — BP 112/68 | HR 82 | Ht 73.0 in | Wt 228.8 lb

## 2015-09-12 DIAGNOSIS — I480 Paroxysmal atrial fibrillation: Secondary | ICD-10-CM | POA: Insufficient documentation

## 2015-09-12 DIAGNOSIS — I10 Essential (primary) hypertension: Secondary | ICD-10-CM | POA: Insufficient documentation

## 2015-09-12 NOTE — Progress Notes (Signed)
Patient ID: [name], male   DOB: 11-29-1945, 70 y.o.   MRN: [id number]     Primary Care Physician: Reita Cliche, MD Referring Physician: Dr. Casandra Doffing Dreier is a 70 y.o. male with a h/o PAF, OSA using cpap, HTN, CAD, s/p bypass, s/p ablation x 3, that is here for f/u, staying  in rhythm with multaq. He has no afib to report. Feels well. Only complaint is that he bleeds easily for hours for superficial cuts. He is on plavix and xarelto.  Today, he denies symptoms of palpitations, chest pain, shortness of breath, orthopnea, PND, lower extremity edema, dizziness, presyncope, syncope, or neurologic sequela. The patient is tolerating medications without difficulties and is otherwise without complaint today.   Past Medical History  Diagnosis Date  . Coronary artery disease   . S/P coronary artery bypass graft x 7 11/16/96  . Peripheral artery disease ([place])     pseudoaneurysm post afib ablation at [facility] 2011, s/p bilateral iliac stents  . HTN (hypertension)   . HLD (hyperlipidemia)   . DM2 (diabetes mellitus, type 2) ([facility])     diet controlled  . Atrial fibrillation ([place])   . Renal artery stenosis ([facility])     right renal artery PTA and stenting  . OSA (obstructive sleep apnea)     uses CPAP   Past Surgical History  Procedure Laterality Date  . Atrial fibrillation ablation  03/27/2010, 12/31/2010    [facility], Dr. Nadeen Landau  . Cardiac stress test  11/21/2009    Exercise capacity 5 METS. No significant ischemia demonstrated  . 2-d echocardiogram  03/25/2010    Normal left ventricular function. Mild MR, TR, trivial AR  . Tooth extraction  05/27/13    tooth extraction with bone graft  . Coronary artery bypass graft  1998  . Cardioversion N/A 03/15/2015    Procedure: CARDIOVERSION;  Surgeon: Lorretta Harp, MD;  Location: [facility] ENDOSCOPY;  Service: Cardiovascular;  Laterality: N/A;  . Orchiectomy  1981    for cancer  . Back surgery  2007    Current Outpatient Prescriptions    Medication Sig Dispense Refill  . clopidogrel (PLAVIX) 75 MG tablet Take 75 mg by mouth at bedtime.    . dronedarone (MULTAQ) 400 MG tablet Take 1 tablet (400 mg total) by mouth 2 (two) times daily with a meal. 180 tablet 3  . furosemide (LASIX) 40 MG tablet Take 40 mg by mouth daily as needed (swelling).    [name] lisinopril-hydrochlorothiazide (PRINZIDE,ZESTORETIC) 10-12.5 MG tablet TAKE 1 TABLET BY MOUTH DAILY. TAKE AS DIRECTED. 90 tablet 3  . metoprolol succinate (TOPROL-XL) 100 MG 24 hr tablet TAKE 1 TABLET EVERY DAY 90 tablet 3  . Multiple Vitamin tablet Take 1 tablet by mouth daily.    . niacin (NIASPAN) 1000 MG CR tablet TAKE 1 TABLET BY MOUTH AT BEDTIME 90 tablet 3  . NON FORMULARY CPAP at night    . rivaroxaban (XARELTO) 20 MG TABS tablet Take 1 tablet (20 mg total) by mouth daily with supper. 90 tablet 3   No current facility-administered medications for this encounter.    No Known Allergies  Social History   Social History  . Marital Status: Divorced    Spouse Name: N/A  . Number of Children: N/A  . Years of Education: N/A   Occupational History  . Not on file.   Social History Main Topics  . Smoking status: Former Smoker    Quit date: 09/01/1994  . Smokeless  tobacco: Never Used  . Alcohol Use: 0.6 - 1.2 oz/week    1-2 Glasses of wine per week     Comment: occasional  . Drug Use: No  . Sexual Activity: Not on file   Other Topics Concern  . Not on file   Social History Narrative   Pt lives in [place] alone.  Divorced.   Retired from [employer])       Family History  Problem Relation Age of Onset  . Cancer Mother   . Cancer Father   . Other Brother     NO MEDICAL PROBLEMS  . Other Son     NO MEDICAL PROBLEMS  . Leukemia Daughter 6    Recover and has no other problems    ROS- All systems are reviewed and negative except as per the HPI above  Physical Exam: Filed Vitals:   09/12/15 0945  BP: 112/68  Pulse: 82  Height: '6\' 1"'$   (1.854 m)  Weight: 228 lb 12.8 oz (103.783 kg)    GEN- The patient is well appearing, alert and oriented x 3 today.   Head- normocephalic, atraumatic Eyes-  Sclera clear, conjunctiva pink Ears- hearing intact Oropharynx- clear Neck- supple, no JVP Lymph- no cervical lymphadenopathy Lungs- Clear to ausculation bilaterally, normal work of breathing Heart- Regular rate and rhythm, no murmurs, rubs or gallops, PMI not laterally displaced GI- soft, NT, ND, + BS Extremities- no clubbing, cyanosis, or edema MS- no significant deformity or atrophy Skin- no rash or lesion Psych- euthymic mood, full affect Neuro- strength and sensation are intact  EKG-SR with marked sinus arrythmia , RBBB Epic records reviewed  Assessment and Plan: 1. afib NSR wih mutaq Continue xarelto  2. HTN Controlled  3. Superficial bleeding issues with plavix and xarelto Will message Dr. Gwenlyn Found to see if plavix can be stopped or if a long term drug  F/u 3 months   Butch Penny C. Carroll, [facility] [address] [place], [place] [address] [phone]

## 2015-09-13 ENCOUNTER — Encounter: Payer: Self-pay | Admitting: Family Medicine

## 2015-09-14 ENCOUNTER — Telehealth: Payer: Self-pay

## 2015-09-14 ENCOUNTER — Encounter: Payer: Self-pay | Admitting: Family Medicine

## 2015-09-14 ENCOUNTER — Ambulatory Visit (INDEPENDENT_AMBULATORY_CARE_PROVIDER_SITE_OTHER): Payer: Medicare Other | Admitting: Family Medicine

## 2015-09-14 VITALS — BP 119/66 | HR 78 | Ht 73.0 in | Wt 228.0 lb

## 2015-09-14 DIAGNOSIS — G4730 Sleep apnea, unspecified: Secondary | ICD-10-CM

## 2015-09-14 DIAGNOSIS — I2510 Atherosclerotic heart disease of native coronary artery without angina pectoris: Secondary | ICD-10-CM | POA: Diagnosis not present

## 2015-09-14 DIAGNOSIS — E785 Hyperlipidemia, unspecified: Secondary | ICD-10-CM

## 2015-09-14 DIAGNOSIS — I110 Hypertensive heart disease with heart failure: Secondary | ICD-10-CM | POA: Insufficient documentation

## 2015-09-14 DIAGNOSIS — G2581 Restless legs syndrome: Secondary | ICD-10-CM

## 2015-09-14 DIAGNOSIS — I480 Paroxysmal atrial fibrillation: Secondary | ICD-10-CM | POA: Diagnosis not present

## 2015-09-14 DIAGNOSIS — I152 Hypertension secondary to endocrine disorders: Secondary | ICD-10-CM | POA: Insufficient documentation

## 2015-09-14 DIAGNOSIS — I272 Other secondary pulmonary hypertension: Secondary | ICD-10-CM | POA: Diagnosis not present

## 2015-09-14 DIAGNOSIS — I739 Peripheral vascular disease, unspecified: Secondary | ICD-10-CM | POA: Insufficient documentation

## 2015-09-14 DIAGNOSIS — I10 Essential (primary) hypertension: Secondary | ICD-10-CM

## 2015-09-14 DIAGNOSIS — Z7901 Long term (current) use of anticoagulants: Secondary | ICD-10-CM

## 2015-09-14 DIAGNOSIS — E1159 Type 2 diabetes mellitus with other circulatory complications: Secondary | ICD-10-CM | POA: Insufficient documentation

## 2015-09-14 DIAGNOSIS — E119 Type 2 diabetes mellitus without complications: Secondary | ICD-10-CM | POA: Insufficient documentation

## 2015-09-14 DIAGNOSIS — I2583 Coronary atherosclerosis due to lipid rich plaque: Secondary | ICD-10-CM

## 2015-09-14 DIAGNOSIS — G4700 Insomnia, unspecified: Secondary | ICD-10-CM

## 2015-09-14 LAB — CBC
HCT: 38.8 % — ABNORMAL LOW (ref 39.0–52.0)
Hemoglobin: 12.5 g/dL — ABNORMAL LOW (ref 13.0–17.0)
MCH: 28.5 pg (ref 26.0–34.0)
MCHC: 32.2 g/dL (ref 30.0–36.0)
MCV: 88.4 fL (ref 78.0–100.0)
MPV: 9.8 fL (ref 8.6–12.4)
Platelets: 309 10*3/uL (ref 150–400)
RBC: 4.39 MIL/uL (ref 4.22–5.81)
RDW: 15.3 % (ref 11.5–15.5)
WBC: 7.9 10*3/uL (ref 4.0–10.5)

## 2015-09-14 LAB — COMPREHENSIVE METABOLIC PANEL
ALT: 10 U/L (ref 9–46)
AST: 17 U/L (ref 10–35)
Albumin: 4.2 g/dL (ref 3.6–5.1)
Alkaline Phosphatase: 64 U/L (ref 40–115)
BUN: 13 mg/dL (ref 7–25)
CO2: 30 mmol/L (ref 20–31)
Calcium: 9.5 mg/dL (ref 8.6–10.3)
Chloride: 101 mmol/L (ref 98–110)
Creat: 1.28 mg/dL — ABNORMAL HIGH (ref 0.70–1.25)
Glucose, Bld: 117 mg/dL — ABNORMAL HIGH (ref 65–99)
Potassium: 4.6 mmol/L (ref 3.5–5.3)
Sodium: 138 mmol/L (ref 135–146)
Total Bilirubin: 0.5 mg/dL (ref 0.2–1.2)
Total Protein: 7.1 g/dL (ref 6.1–8.1)

## 2015-09-14 LAB — TSH: TSH: 1.164 u[IU]/mL (ref 0.350–4.500)

## 2015-09-14 LAB — HEMOGLOBIN A1C
Hgb A1c MFr Bld: 6.6 % — ABNORMAL HIGH (ref <5.7)
Mean Plasma Glucose: 143 mg/dL — ABNORMAL HIGH (ref <117)

## 2015-09-14 MED ORDER — SUVOREXANT 10 MG PO TABS: 1.0000 | ORAL_TABLET | Freq: Every day | ORAL | Status: DC

## 2015-09-14 MED ORDER — SUVOREXANT 20 MG PO TABS: 20.0000 mg | ORAL_TABLET | Freq: Every day | ORAL | Status: DC

## 2015-09-14 MED ORDER — SUVOREXANT 15 MG PO TABS: 15.0000 mg | ORAL_TABLET | Freq: Every day | ORAL | Status: DC

## 2015-09-14 NOTE — Assessment & Plan Note (Signed)
Main complaint today. Discussed options. I would like to avoid sedative hypnotics and benzodiazepines as these are on the beers list and will be contraindicated as patient ages.  However patient cannot take trazodone as it will prolong his already mildly prolonged QT segment when used in conjunction with Multaq.  Therefore I feel the best medicine at this time as Belsomra. We'll do the trial starting dosage of Belsomra and check in one month.

## 2015-09-14 NOTE — Assessment & Plan Note (Signed)
Obtain routine blood work

## 2015-09-14 NOTE — Progress Notes (Signed)
[name] is a 70 y.o. male who presents to [facility] today for establish care and discuss insomnia.  Patient has many significant and serious medical problems mostly related to his heart. He has a history of coronary artery disease with a 7 vessel bypass in 1998. Additionally has peripheral arterial disease with iliac and renal stents. Additionally he has persistent atrial fibrillation currently controlled with Multaq and metoprolol. His cardiovascular issues are typically managed with cardiology however he does not have a primary care provider.  The issue the patient is most concerned about today is his persistent insomnia. He has a long history of difficulty falling asleep he read in the past she used Ambien which caused sedation and grogginess but didn't work. Sometimes he takes an occasional Xanax which helps significantly. He is not sure if he's ever been on trazodone and is pretty sure he has never been on Belsomra. Additionally he has difficulty falling asleep due to restless leg syndrome. He does not take any specific medication for restless leg syndrome.   Past Medical History  Diagnosis Date  . Coronary artery disease   . S/P coronary artery bypass graft x 7 11/16/96  . Peripheral artery disease ([employer])     pseudoaneurysm post afib ablation at [facility] 2011, s/p bilateral iliac stents  . HTN (hypertension)   . HLD (hyperlipidemia)   . DM2 (diabetes mellitus, type 2) (HCC)     diet controlled  . Atrial fibrillation ([place])   . Renal artery stenosis (HCC)     right renal artery PTA and stenting  . OSA (obstructive sleep apnea)     uses CPAP   Past Surgical History  Procedure Laterality Date  . Atrial fibrillation ablation  03/27/2010, 12/31/2010    [facility], Dr. Nadeen Landau  . Cardiac stress test  11/21/2009    Exercise capacity 5 METS. No significant ischemia demonstrated  . 2-d  echocardiogram  03/25/2010    Normal left ventricular function. Mild MR, TR, trivial AR  . Tooth extraction  05/27/13    tooth extraction with bone graft  . Coronary artery bypass graft  1998  . Cardioversion N/A 03/15/2015    Procedure: CARDIOVERSION;  Surgeon: Lorretta Harp, MD;  Location: [facility] ENDOSCOPY;  Service: Cardiovascular;  Laterality: N/A;  . Orchiectomy  1981    for cancer  . Back surgery  2007   Social History  Substance Use Topics  . Smoking status: Former Smoker    Quit date: 09/01/1994  . Smokeless tobacco: Never Used  . Alcohol Use: 0.6 - 1.2 oz/week    1-2 Glasses of wine per week     Comment: occasional   family history includes Cancer in his father and mother; Leukemia (age of onset: 18) in his daughter; Other in his brother and son.  ROS as above: No chest pain palpitations shortness of breath fevers chills nausea vomiting diarrhea abdominal pain. Medications: Current Outpatient Prescriptions  Medication Sig Dispense Refill  . atorvastatin (LIPITOR) 10 MG tablet TK 1 T PO QD  2  . clopidogrel (PLAVIX) 75 MG tablet Take 75 mg by mouth at bedtime.    . dronedarone (MULTAQ) 400 MG tablet Take 1 tablet (400 mg total) by mouth 2 (two) times daily with a meal. 180 tablet 3  . lisinopril-hydrochlorothiazide (PRINZIDE,ZESTORETIC) 10-12.5 MG tablet TAKE 1 TABLET BY MOUTH DAILY. TAKE AS DIRECTED. 90 tablet 3  . MAGNESIUM PO Take by mouth.    [name]  metoprolol succinate (TOPROL-XL) 100 MG 24 hr tablet TAKE 1 TABLET EVERY DAY 90 tablet 3  . Multiple Vitamin tablet Take 1 tablet by mouth daily.    . niacin (NIASPAN) 1000 MG CR tablet TAKE 1 TABLET BY MOUTH AT BEDTIME 90 tablet 3  . NON FORMULARY CPAP at night    . rivaroxaban (XARELTO) 20 MG TABS tablet Take 1 tablet (20 mg total) by mouth daily with supper. 90 tablet 3  . furosemide (LASIX) 40 MG tablet Take 40 mg by mouth daily as needed (swelling). Reported on 09/14/2015    . Suvorexant (BELSOMRA) 10 MG TABS Take 1 tablet by  mouth at bedtime. 10 tablet 0  . Suvorexant (BELSOMRA) 15 MG TABS Take 15 mg by mouth at bedtime. 10 tablet 0  . Suvorexant (BELSOMRA) 20 MG TABS Take 20 mg by mouth at bedtime. 10 tablet 0   No current facility-administered medications for this visit.   No Known Allergies   Exam:  BP 119/66 mmHg  Pulse 78  Ht '6\' 1"'$  (1.854 m)  Wt 228 lb (103.42 kg)  BMI 30.09 kg/m2 Gen: Well NAD nontoxic appearing HEENT: EOMI,  MMM Lungs: Normal work of breathing. CTABL Heart: Irregular heartbeat normal rate no MRG Abd: NABS, Soft. Nondistended, Nontender Exts: Brisk capillary refill, warm and well perfused.  Psych: Alert and oriented normal affect speech and thought process  Medical record reviewed No results found for this or any previous visit (from the past 24 hour(s)). No results found.   Please see individual assessment and plan sections.

## 2015-09-14 NOTE — Assessment & Plan Note (Signed)
Patient denies having diabetes stating that it's just prediabetes. We'll obtain A1c to tell for sure.

## 2015-09-14 NOTE — Patient Instructions (Signed)
Thank you for coming in today. Get labs today.  Try Belsomra for sleep.  You can take '10mg'$ , '15mg'$  or '20mg'$  at night as needed,  Let me know which dose works best for you.  Return in a month or so for sleep follow up.  Call or go to the emergency room if you get worse, have trouble breathing, have chest pains, or palpitations.    Insomnia Insomnia is a sleep disorder that makes it difficult to fall asleep or to stay asleep. Insomnia can cause tiredness (fatigue), low energy, difficulty concentrating, mood swings, and poor performance at work or school.  There are three different ways to classify insomnia:  Difficulty falling asleep.  Difficulty staying asleep.  Waking up too early in the morning. Any type of insomnia can be long-term (chronic) or short-term (acute). Both are common. Short-term insomnia usually lasts for three months or less. Chronic insomnia occurs at least three times a week for longer than three months. CAUSES  Insomnia may be caused by another condition, situation, or substance, such as:  Anxiety.  Certain medicines.  Gastroesophageal reflux disease (GERD) or other gastrointestinal conditions.  Asthma or other breathing conditions.  Restless legs syndrome, sleep apnea, or other sleep disorders.  Chronic pain.  Menopause. This may include hot flashes.  Stroke.  Abuse of alcohol, tobacco, or illegal drugs.  Depression.  Caffeine.   Neurological disorders, such as Alzheimer disease.  An overactive thyroid (hyperthyroidism). The cause of insomnia may not be known. RISK FACTORS Risk factors for insomnia include:  Gender. Women are more commonly affected than men.  Age. Insomnia is more common as you get older.  Stress. This may involve your professional or personal life.  Income. Insomnia is more common in people with lower income.  Lack of exercise.   Irregular work schedule or night shifts.  Traveling between different time zones. SIGNS  AND SYMPTOMS If you have insomnia, trouble falling asleep or trouble staying asleep is the main symptom. This may lead to other symptoms, such as:  Feeling fatigued.  Feeling nervous about going to sleep.  Not feeling rested in the morning.  Having trouble concentrating.  Feeling irritable, anxious, or depressed. TREATMENT  Treatment for insomnia depends on the cause. If your insomnia is caused by an underlying condition, treatment will focus on addressing the condition. Treatment may also include:   Medicines to help you sleep.  Counseling or therapy.  Lifestyle adjustments. HOME CARE INSTRUCTIONS   Take medicines only as directed by your health care provider.  Keep regular sleeping and waking hours. Avoid naps.  Keep a sleep diary to help you and your health care provider figure out what could be causing your insomnia. Include:   When you sleep.  When you wake up during the night.  How well you sleep.   How rested you feel the next day.  Any side effects of medicines you are taking.  What you eat and drink.   Make your bedroom a comfortable place where it is easy to fall asleep:  Put up shades or special blackout curtains to block light from outside.  Use a white noise machine to block noise.  Keep the temperature cool.   Exercise regularly as directed by your health care provider. Avoid exercising right before bedtime.  Use relaxation techniques to manage stress. Ask your health care provider to suggest some techniques that may work well for you. These may include:  Breathing exercises.  Routines to release muscle tension.  Visualizing  peaceful scenes.  Cut back on alcohol, caffeinated beverages, and cigarettes, especially close to bedtime. These can disrupt your sleep.  Do not overeat or eat spicy foods right before bedtime. This can lead to digestive discomfort that can make it hard for you to sleep.  Limit screen use before bedtime. This  includes:  Watching TV.  Using your smartphone, tablet, and computer.  Stick to a routine. This can help you fall asleep faster. Try to do a quiet activity, brush your teeth, and go to bed at the same time each night.  Get out of bed if you are still awake after 15 minutes of trying to sleep. Keep the lights down, but try reading or doing a quiet activity. When you feel sleepy, go back to bed.  Make sure that you drive carefully. Avoid driving if you feel very sleepy.  Keep all follow-up appointments as directed by your health care provider. This is important. SEEK MEDICAL CARE IF:   You are tired throughout the day or have trouble in your daily routine due to sleepiness.  You continue to have sleep problems or your sleep problems get worse. SEEK IMMEDIATE MEDICAL CARE IF:   You have serious thoughts about hurting yourself or someone else.   This information is not intended to replace advice given to you by your health care provider. Make sure you discuss any questions you have with your health care provider.   Document Released: 08/15/2000 Document Revised: 05/09/2015 Document Reviewed: 05/19/2014 Elsevier Interactive Patient Education 2016 [place] oral tablets What is this medicine? SUVOREXANT (su-vor-EX-ant) is used to treat insomnia. This medicine helps you to fall asleep and sleep through the night. This medicine may be used for other purposes; ask your health care provider or pharmacist if you have questions. What should I tell my health care provider before I take this medicine? They need to know if you have any of these conditions: -depression -history of a drug or alcohol abuse problem -history of daytime sleepiness -history of sudden onset of muscle weakness (cataplexy) -liver disease -lung or breathing disease -narcolepsy -suicidal thoughts, plans, or attempt; a previous suicide attempt by you or a family member -an unusual or allergic reaction  to suvorexant, other medicines, foods, dyes, or preservatives -pregnant or trying to get pregnant -breast-feeding How should I use this medicine? Take this medicine by mouth within 30 minutes of going to bed. Do not take it unless you are able to stay in bed a full night before you must be active again. Follow the directions on the prescription label. For best results, it is better to take this medicine on an empty stomach. Do not take your medicine more often than directed. Do not stop taking this medicine on your own. Always follow your doctor or health care professional's advice. A special MedGuide will be given to you by the pharmacist with each prescription and refill. Be sure to read this information carefully each time. Talk to your pediatrician regarding the use of this medicine in children. Special care may be needed. Overdosage: If you think you have taken too much of this medicine contact a poison control center or emergency room at once. NOTE: This medicine is only for you. Do not share this medicine with others. What if I miss a dose? This medicine should only be taken immediately before going to sleep. Do not take double or extra doses. What may interact with this medicine? -alcohol -antiviral medicines for HIV or AIDS -aprepitant -  carbamazepine -certain antibiotics like ciprofloxacin, clarithromycin, erythromycin, telithromycin -certain medicines for depression or psychotic disturbances -certain medicines for fungal infections like ketoconazole, posaconazole, fluconazole, or itraconazole -conivaptan -digoxin -diltiazem -grapefruit juice -imatinib -medicines for anxiety or sleep -phenytoin -rifampin -verapamil This list may not describe all possible interactions. Give your health care provider a list of all the medicines, herbs, non-prescription drugs, or dietary supplements you use. Also tell them if you smoke, drink alcohol, or use illegal drugs. Some items may interact  with your medicine. What should I watch for while using this medicine? Visit your doctor or health care professional for regular checks on your progress. Keep a regular sleep schedule by going to bed at about the same time each night. Avoid caffeine-containing drinks in the evening hours. When sleep medicines are used every night for more than a few weeks, they may stop working. Do not increase the dose on your own. Talk to your doctor if your insomnia worsens or is not better within 7 to 10 days. After taking this medicine for sleep, you may get up out of bed while not being fully awake and do an activity that you do not know you are doing. The next morning, you may have no memory of the event. Activities such as driving a car ("sleep-driving"), making and eating food, talking on the phone, sexual activity, and sleep-walking have been reported. Call your doctor right away if you find out you have done any of these activities. Do not take this medicine if you have used alcohol that evening or before bed or taken another medicine for sleep, since your risk of doing these sleep-related activities will be increased. Do not take this medicine unless you are able to stay in bed for a full night (7 to 8 hours) and do not drive or perform other activities requiring full alertness within 8 hours of a dose. Do not drive, use machinery, or do anything that needs mental alertness the day after you take the 20 mg dose of this medicine. The use of lower doses (10 mg) also has the potential to cause driving impairment the next day. You may have a decrease in mental alertness the day after use, even if you feel that you are fully awake. Tell your doctor if you will need to perform activities requiring full alertness, such as driving, the next day. Do not stand or sit up quickly after taking this medicine, especially if you are an older patient. This reduces the risk of dizzy or fainting spells. If you or your family notice  any changes in your behavior, such as new or worsening depression, thoughts of harming yourself, anxiety, other unusual or disturbing thoughts, or memory loss, call your doctor right away. What side effects may I notice from receiving this medicine? Side effects that you should report to your doctor or health care professional as soon as possible: -allergic reactions like skin rash, itching or hives, swelling of the face, lips, or tongue -confusion -depressed mood -feeling faint or lightheaded, falls -hallucinations -inability to move or speak for up to several minutes while you are going to sleep or waking up -memory loss -periods of leg weakness lasting from seconds to a few minutes -problems with balance, speaking, walking -restlessness, excitability, or feelings of agitation -unusual activities while asleep like driving, eating, making phone calls Side effects that usually do not require medical attention (Report these to your doctor or health care professional if they continue or are bothersome.): -abnormal dreams -daytime drowsiness -  diarrhea -dizziness -headache This list may not describe all possible side effects. Call your doctor for medical advice about side effects. You may report side effects to FDA at 1-800-FDA-1088. Where should I keep my medicine? Keep out of the reach of children. This medicine can be abused. Keep your medicine in a safe place to protect it from theft. Do not share this medicine with anyone. Selling or giving away this medicine is dangerous and against the law. Store at room temperature between 15 and 30 degrees C (59 and 86 degrees F). Throw away any unused medicine after the expiration date. NOTE: This sheet is a summary. It may not cover all possible information. If you have questions about this medicine, talk to your doctor, pharmacist, or health care provider.    2016, Elsevier/Gold Standard. (2015-02-05 13:22:51)

## 2015-09-14 NOTE — Telephone Encounter (Signed)
Pt sent mychart message in regards to having a hep C screening and shingles vaccine at today's office visit. I called [facility] and added the hep C screening to the lab orders. I also advised pt that his insurance requires [place] to send a rx for the shingles vaccine to his pharmacy and that I would have you to send this prescription on Monday 09/17/2015. Please send rx to:  [id number] - [place], [place] - [address] [place] [address] [place] [address].

## 2015-09-15 LAB — VITAMIN D 25 HYDROXY (VIT D DEFICIENCY, FRACTURES): Vit D, 25-Hydroxy: 30 ng/mL (ref 30–100)

## 2015-09-15 LAB — HEPATITIS C ANTIBODY: HCV Ab: NEGATIVE

## 2015-09-17 ENCOUNTER — Encounter: Payer: Self-pay | Admitting: Family Medicine

## 2015-09-17 DIAGNOSIS — N1831 Chronic kidney disease, stage 3a: Secondary | ICD-10-CM | POA: Insufficient documentation

## 2015-09-17 DIAGNOSIS — N183 Chronic kidney disease, stage 3 (moderate): Secondary | ICD-10-CM

## 2015-09-17 MED ORDER — ZOSTER VACCINE LIVE 19400 UNT/0.65ML ~~LOC~~ SOLR: 0.6500 mL | Freq: Once | SUBCUTANEOUS | Status: DC

## 2015-09-17 NOTE — Telephone Encounter (Signed)
Zoster vaccine sent.

## 2015-09-17 NOTE — Progress Notes (Signed)
Quick Note:  1) Labs show very mild Diabetes. The A1c is 6.6 and the cutoff for diabetes is >6.5. There is not much to do for this at this moment but watchful waiting and diet and exercise.  2) You have mild kidney damage/age. We will continue watchful waiting.   ______

## 2015-09-18 ENCOUNTER — Encounter: Payer: Self-pay | Admitting: Family Medicine

## 2015-09-18 DIAGNOSIS — D649 Anemia, unspecified: Secondary | ICD-10-CM

## 2015-09-20 LAB — IRON AND TIBC
%SAT: 5 % — ABNORMAL LOW (ref 15–60)
Iron: 22 ug/dL — ABNORMAL LOW (ref 50–180)
TIBC: 415 ug/dL (ref 250–425)
UIBC: 393 ug/dL (ref 125–400)

## 2015-09-20 LAB — RETICULOCYTES
ABS Retic: 54.8 10*3/uL (ref 19.0–186.0)
RBC.: 4.57 MIL/uL (ref 4.22–5.81)
Retic Ct Pct: 1.2 % (ref 0.4–2.3)

## 2015-09-20 LAB — FERRITIN: Ferritin: 13 ng/mL — ABNORMAL LOW (ref 22–322)

## 2015-09-20 MED ORDER — FERRALET 90 90-1 MG PO TABS: 1.0000 | ORAL_TABLET | Freq: Every day | ORAL | Status: DC

## 2015-09-20 NOTE — Addendum Note (Signed)
Addended by: Gregor Hams on: 09/20/2015 07:50 AM   Modules accepted: Orders

## 2015-09-20 NOTE — Telephone Encounter (Signed)
Quick Note:  Anemai studies do show iron deficiency. Plan to prescribe Ferralet 90 ______

## 2015-10-16 ENCOUNTER — Encounter: Payer: Self-pay | Admitting: Family Medicine

## 2015-10-17 ENCOUNTER — Encounter: Payer: Self-pay | Admitting: Family Medicine

## 2015-10-17 ENCOUNTER — Other Ambulatory Visit: Payer: Self-pay

## 2015-10-17 ENCOUNTER — Ambulatory Visit (INDEPENDENT_AMBULATORY_CARE_PROVIDER_SITE_OTHER): Payer: Medicare Other | Admitting: Family Medicine

## 2015-10-17 VITALS — BP 107/57 | HR 67 | Wt 230.0 lb

## 2015-10-17 DIAGNOSIS — D509 Iron deficiency anemia, unspecified: Secondary | ICD-10-CM

## 2015-10-17 DIAGNOSIS — I480 Paroxysmal atrial fibrillation: Secondary | ICD-10-CM | POA: Diagnosis not present

## 2015-10-17 DIAGNOSIS — Z23 Encounter for immunization: Secondary | ICD-10-CM

## 2015-10-17 DIAGNOSIS — E785 Hyperlipidemia, unspecified: Secondary | ICD-10-CM

## 2015-10-17 DIAGNOSIS — I10 Essential (primary) hypertension: Secondary | ICD-10-CM

## 2015-10-17 DIAGNOSIS — I272 Other secondary pulmonary hypertension: Secondary | ICD-10-CM | POA: Diagnosis not present

## 2015-10-17 DIAGNOSIS — E1140 Type 2 diabetes mellitus with diabetic neuropathy, unspecified: Secondary | ICD-10-CM | POA: Insufficient documentation

## 2015-10-17 DIAGNOSIS — E1142 Type 2 diabetes mellitus with diabetic polyneuropathy: Secondary | ICD-10-CM

## 2015-10-17 DIAGNOSIS — I739 Peripheral vascular disease, unspecified: Secondary | ICD-10-CM | POA: Diagnosis not present

## 2015-10-17 DIAGNOSIS — G4700 Insomnia, unspecified: Secondary | ICD-10-CM

## 2015-10-17 MED ORDER — FERRAPLUS 90 90-1 MG PO TABS: 1.0000 | ORAL_TABLET | Freq: Three times a day (TID) | ORAL | Status: DC

## 2015-10-17 MED ORDER — GABAPENTIN 300 MG PO CAPS: 300.0000 mg | ORAL_CAPSULE | Freq: Every evening | ORAL | Status: DC | PRN

## 2015-10-17 NOTE — Assessment & Plan Note (Signed)
Unclear etiology. Refer to gastroenterology for colonoscopy and possible endoscopy. Continue oral iron. Recheck iron levels in a few months.

## 2015-10-17 NOTE — Assessment & Plan Note (Signed)
Documented on monofilament exam today.  continue watchful waiting and observation and feet. Return sooner if needed.

## 2015-10-17 NOTE — Assessment & Plan Note (Signed)
Stop Belsomra. Start gabapentin. Recheck in a few months.

## 2015-10-17 NOTE — Patient Instructions (Signed)
Thank you for coming in today. Try gabapenin at night.  Increase iron to 2-3 x daily.  Recheck iron stores in 2-3 months.  You should hear from the stomach doctors.  Return sooner if needed.

## 2015-10-17 NOTE — Progress Notes (Signed)
[name] is a 70 y.o. male who presents to [facility] today for follow-up insomnia, iron deficiency.  1) insomnia: Patient was recently started on Belsomra. He notes this is only mildly effective. He has tried multiple other medicines in the past. He notes his daughter had great response to gabapentin and is interested in trying gabapentin.  2) iron deficiency: Patient has a history of iron deficiency with unclear etiology. He's had an extensive GI workup about 5 years ago that was unrevealing. He was treated with iron. He recently on lab work was found to have low hemoglobin with subsequent lab work showing again iron deficiency. He was restarted back on iron tablets. He would like to continue the iron tablets as he tolerates them well.  3) diabetes: Patient was found to have diabetes with most recent labs. Patient previously thought he had prediabtes. He notes that he has had a diabetic eye exam in October 2016. He's not sure if he's ever had a diabetic foot exam. He notes that he is due for a pneumonia vaccination.   Past Medical History  Diagnosis Date  . Coronary artery disease   . S/P coronary artery bypass graft x 7 11/16/96  . Peripheral artery disease ([place])     pseudoaneurysm post afib ablation at [facility] 2011, s/p bilateral iliac stents  . HTN (hypertension)   . HLD (hyperlipidemia)   . DM2 (diabetes mellitus, type 2) (HCC)     diet controlled  . Atrial fibrillation ([place])   . Renal artery stenosis (H[facility])     right renal artery PTA and stenting  . OSA (obstructive sleep apnea)     uses CPAP   Past Surgical History  Procedure Laterality Date  . Atrial fibrillation ablation  03/27/2010, 12/31/2010    [facility], Dr. Nadeen Landau  . Cardiac stress test  11/21/2009    Exercise capacity 5 METS. No significant ischemia demonstrated  . 2-d echocardiogram  03/25/2010    Normal left  ventricular function. Mild MR, TR, trivial AR  . Tooth extraction  05/27/13    tooth extraction with bone graft  . Coronary artery bypass graft  1998  . Cardioversion N/A 03/15/2015    Procedure: CARDIOVERSION;  Surgeon: Lorretta Harp, MD;  Location: [facility] ENDOSCOPY;  Service: Cardiovascular;  Laterality: N/A;  . Orchiectomy  1981    for cancer  . Back surgery  2007   Social History  Substance Use Topics  . Smoking status: Former Smoker    Quit date: 09/01/1994  . Smokeless tobacco: Never Used  . Alcohol Use: 0.6 - 1.2 oz/week    1-2 Glasses of wine per week     Comment: occasional   family history includes Cancer in his father and mother; Leukemia (age of onset: 42) in his daughter; Other in his brother and son.  ROS as above Medications: Current Outpatient Prescriptions  Medication Sig Dispense Refill  . atorvastatin (LIPITOR) 10 MG tablet TK 1 T PO QD  2  . clopidogrel (PLAVIX) 75 MG tablet Take 75 mg by mouth at bedtime.    . dronedarone (MULTAQ) 400 MG tablet Take 1 tablet (400 mg total) by mouth 2 (two) times daily with a meal. 180 tablet 3  . furosemide (LASIX) 40 MG tablet Take 40 mg by mouth daily as needed (swelling). Reported on 09/14/2015    . gabapentin (NEURONTIN) 300 MG capsule Take 1 capsule (300 mg total) by mouth at bedtime as  needed (sleep). 90 capsule 1  . Iron-Folic [id number] (FERRAPLUS 90) 90-1 MG TABS Take 1 tablet by mouth 3 (three) times daily. 90 tablet 3  . lisinopril-hydrochlorothiazide (PRINZIDE,ZESTORETIC) 10-12.5 MG tablet TAKE 1 TABLET BY MOUTH DAILY. TAKE AS DIRECTED. 90 tablet 3  . MAGNESIUM PO Take by mouth.    . metoprolol succinate (TOPROL-XL) 100 MG 24 hr tablet TAKE 1 TABLET EVERY DAY 90 tablet 3  . Multiple Vitamin tablet Take 1 tablet by mouth daily.    . niacin (NIASPAN) 1000 MG CR tablet TAKE 1 TABLET BY MOUTH AT BEDTIME 90 tablet 3  . NON FORMULARY CPAP at night    . rivaroxaban (XARELTO) 20 MG TABS tablet Take 1 tablet (20 mg  total) by mouth daily with supper. 90 tablet 3  . zoster vaccine live, PF, (ZOSTAVAX) 51102 UNT/0.65ML injection Inject 19,400 Units into the skin once. 1 each 0   No current facility-administered medications for this visit.   No Known Allergies   Exam:  BP 107/57 mmHg  Pulse 67  Wt 230 lb (104.327 kg) Gen: Well NAD HEENT: EOMI,  MMM Lungs: Normal work of breathing. CTABL Heart: RRR no MRG Abd: NABS, Soft. Nondistended, Nontender Exts: Brisk capillary refill, warm and well perfused.   Diabetic foot exam with intact pulses thickened toenails and decreased sensation bilaterally.   No results found for this or any previous visit (from the past 24 hour(s)). No results found.   Please see individual assessment and plan sections.   Pneumonia 13 vaccine given prior to discharge

## 2015-10-23 ENCOUNTER — Encounter: Payer: Self-pay | Admitting: Cardiovascular Disease

## 2015-10-25 ENCOUNTER — Encounter: Payer: Self-pay | Admitting: Family Medicine

## 2015-11-02 ENCOUNTER — Encounter: Payer: Self-pay | Admitting: Family Medicine

## 2015-11-06 ENCOUNTER — Encounter: Payer: Self-pay | Admitting: Family Medicine

## 2015-11-07 ENCOUNTER — Telehealth: Payer: Self-pay | Admitting: Pharmacist

## 2015-11-07 NOTE — Telephone Encounter (Signed)
-----   Message from Lorretta Harp, MD sent at 11/07/2015 10:57 AM EST ----- OK to stop plavix  JJB ----- Message -----    From: Rockne Menghini, RPH-CPP    Sent: 11/06/2015   4:29 PM      To: Lorretta Harp, MD  Dr. Gwenlyn Found  Received this message from [name] last week:   Having changed from warfarin to Xarelto, and continuing to take plavix, I have noted an increase in the time it takes to stop bleeding -- even with the smallest nicks -- sometimes hours. I am carrying around, in my car, 4x4's, paper tape and "wound seal" to handle bleeds.  Just wanted to confirm that you want me to continue both Xarelto and plavix. I worry mostly about internal bleeding that's not obvious and easily treatable by me  I reviewed with [name], his last stents were >9 years ago (iliac and renal), so we advised him to d/c plavix for now.  Would you like him back on it?   [name]

## 2015-11-07 NOTE — Telephone Encounter (Signed)
LMOM for patient confirming he could stop Plavix.

## 2015-12-12 ENCOUNTER — Ambulatory Visit (HOSPITAL_COMMUNITY)
Admission: RE | Admit: 2015-12-12 | Discharge: 2015-12-12 | Disposition: A | Discharge status: Home / Self Care | Payer: Medicare Other | Source: Ambulatory Visit | Attending: Nurse Practitioner | Admitting: Nurse Practitioner

## 2015-12-12 ENCOUNTER — Encounter (HOSPITAL_COMMUNITY): Payer: Self-pay | Admitting: Nurse Practitioner

## 2015-12-12 VITALS — BP 118/72 | HR 78 | Ht 73.0 in | Wt 236.8 lb

## 2015-12-12 DIAGNOSIS — G4733 Obstructive sleep apnea (adult) (pediatric): Secondary | ICD-10-CM | POA: Insufficient documentation

## 2015-12-12 DIAGNOSIS — I480 Paroxysmal atrial fibrillation: Secondary | ICD-10-CM | POA: Insufficient documentation

## 2015-12-12 DIAGNOSIS — Z7901 Long term (current) use of anticoagulants: Secondary | ICD-10-CM | POA: Insufficient documentation

## 2015-12-12 DIAGNOSIS — R0602 Shortness of breath: Secondary | ICD-10-CM

## 2015-12-12 DIAGNOSIS — E119 Type 2 diabetes mellitus without complications: Secondary | ICD-10-CM | POA: Insufficient documentation

## 2015-12-12 DIAGNOSIS — I2510 Atherosclerotic heart disease of native coronary artery without angina pectoris: Secondary | ICD-10-CM | POA: Insufficient documentation

## 2015-12-12 DIAGNOSIS — E785 Hyperlipidemia, unspecified: Secondary | ICD-10-CM | POA: Insufficient documentation

## 2015-12-12 DIAGNOSIS — D509 Iron deficiency anemia, unspecified: Secondary | ICD-10-CM | POA: Insufficient documentation

## 2015-12-12 DIAGNOSIS — I729 Aneurysm of unspecified site: Secondary | ICD-10-CM | POA: Insufficient documentation

## 2015-12-12 DIAGNOSIS — Z951 Presence of aortocoronary bypass graft: Secondary | ICD-10-CM | POA: Diagnosis not present

## 2015-12-12 DIAGNOSIS — I10 Essential (primary) hypertension: Secondary | ICD-10-CM | POA: Diagnosis not present

## 2015-12-12 DIAGNOSIS — Z87891 Personal history of nicotine dependence: Secondary | ICD-10-CM | POA: Diagnosis not present

## 2015-12-12 DIAGNOSIS — Z0189 Encounter for other specified special examinations: Secondary | ICD-10-CM | POA: Insufficient documentation

## 2015-12-12 LAB — CBC
HCT: 38.3 % — ABNORMAL LOW (ref 39.0–52.0)
Hemoglobin: 11.6 g/dL — ABNORMAL LOW (ref 13.0–17.0)
MCH: 26.3 pg (ref 26.0–34.0)
MCHC: 30.3 g/dL (ref 30.0–36.0)
MCV: 86.8 fL (ref 78.0–100.0)
Platelets: 243 10*3/uL (ref 150–400)
RBC: 4.41 MIL/uL (ref 4.22–5.81)
RDW: 17 % — ABNORMAL HIGH (ref 11.5–15.5)
WBC: 7 10*3/uL (ref 4.0–10.5)

## 2015-12-12 LAB — COMPREHENSIVE METABOLIC PANEL
ALT: 14 U/L — ABNORMAL LOW (ref 17–63)
AST: 25 U/L (ref 15–41)
Albumin: 3.8 g/dL (ref 3.5–5.0)
Alkaline Phosphatase: 63 U/L (ref 38–126)
Anion gap: 9 (ref 5–15)
BUN: 8 mg/dL (ref 6–20)
CO2: 28 mmol/L (ref 22–32)
Calcium: 9.4 mg/dL (ref 8.9–10.3)
Chloride: 101 mmol/L (ref 101–111)
Creatinine, Ser: 1.18 mg/dL (ref 0.61–1.24)
GFR calc Af Amer: >60 mL/min (ref >60)
GFR calc non Af Amer: >60 mL/min (ref >60)
Glucose, Bld: 160 mg/dL — ABNORMAL HIGH (ref 65–99)
Potassium: 4.9 mmol/L (ref 3.5–5.1)
Sodium: 138 mmol/L (ref 135–145)
Total Bilirubin: 0.7 mg/dL (ref 0.3–1.2)
Total Protein: 7.1 g/dL (ref 6.5–8.1)

## 2015-12-12 NOTE — Progress Notes (Signed)
Patient ID: [name], male   DOB: 28-Jul-1946, 70 y.o.   MRN: [id number]     Primary Care Physician: Lynne Leader, MD Referring Physician: Dr. Casandra Doffing Kassem is a 70 y.o. male with a h/o PAF, OSA using cpap, HTN, CAD, s/p bypass, s/p ablation x 3, that is here for f/u, on multaq. He has no afib to report.  But is in afib today which the pt was surprised to discover.  He was on xarelto and plavix before and now plavix has been d/ced by cardiologist.  He reports that he has mild shortness of breath for months now, which he contributes to anemia. His PCP is following this but he would like a cbc done today to see if hgb is trending up.   Today, he denies symptoms of palpitations, chest pain, shortness of breath, orthopnea, PND, lower extremity edema, dizziness, presyncope, syncope, or neurologic sequela. The patient is tolerating medications without difficulties and is otherwise without complaint today.   Past Medical History  Diagnosis Date  . Coronary artery disease   . S/P coronary artery bypass graft x 7 11/16/96  . Peripheral artery disease ([place])     pseudoaneurysm post afib ablation at [facility] 2011, s/p bilateral iliac stents  . HTN (hypertension)   . HLD (hyperlipidemia)   . DM2 (diabetes mellitus, type 2) (HCC)     diet controlled  . Atrial fibrillation ([place])   . Renal artery stenosis (HCC)     right renal artery PTA and stenting  . OSA (obstructive sleep apnea)     uses CPAP   Past Surgical History  Procedure Laterality Date  . Atrial fibrillation ablation  03/27/2010, 12/31/2010    [facility], Dr. Nadeen Landau  . Cardiac stress test  11/21/2009    Exercise capacity 5 METS. No significant ischemia demonstrated  . 2-d echocardiogram  03/25/2010    Normal left ventricular function. Mild MR, TR, trivial AR  . Tooth extraction  05/27/13    tooth extraction with bone graft  . Coronary artery bypass graft  1998  . Cardioversion N/A 03/15/2015    Procedure: CARDIOVERSION;   Surgeon: Lorretta Harp, MD;  Location: [facility] ENDOSCOPY;  Service: Cardiovascular;  Laterality: N/A;  . Orchiectomy  1981    for cancer  . Back surgery  2007    Current Outpatient Prescriptions  Medication Sig Dispense Refill  . atorvastatin (LIPITOR) 10 MG tablet TK 1 T PO QD  2  . dronedarone (MULTAQ) 400 MG tablet Take 1 tablet (400 mg total) by mouth 2 (two) times daily with a meal. 180 tablet 3  . furosemide (LASIX) 40 MG tablet Take 40 mg by mouth daily as needed (swelling). Reported on 09/14/2015    . gabapentin (NEURONTIN) 300 MG capsule Take 1 capsule (300 mg total) by mouth at bedtime as needed (sleep). 90 capsule 1  . Iron-Folic [id number] (FERRAPLUS 90) 90-1 MG TABS Take 1 tablet by mouth 3 (three) times daily. 90 tablet 3  . lisinopril-hydrochlorothiazide (PRINZIDE,ZESTORETIC) 10-12.5 MG tablet TAKE 1 TABLET BY MOUTH DAILY. TAKE AS DIRECTED. 90 tablet 3  . metoprolol succinate (TOPROL-XL) 100 MG 24 hr tablet TAKE 1 TABLET EVERY DAY 90 tablet 3  . Multiple Vitamin tablet Take 1 tablet by mouth daily.    . niacin (NIASPAN) 1000 MG CR tablet TAKE 1 TABLET BY MOUTH AT BEDTIME 90 tablet 3  . NON FORMULARY CPAP at night    . rivaroxaban (XARELTO) 20 MG TABS tablet Take  1 tablet (20 mg total) by mouth daily with supper. 90 tablet 3   No current facility-administered medications for this encounter.    No Known Allergies  Social History   Social History  . Marital Status: Divorced    Spouse Name: N/A  . Number of Children: N/A  . Years of Education: N/A   Occupational History  . Not on file.   Social History Main Topics  . Smoking status: Former Smoker    Quit date: 09/01/1994  . Smokeless tobacco: Never Used  . Alcohol Use: 0.6 - 1.2 oz/week    1-2 Glasses of wine per week     Comment: occasional  . Drug Use: No  . Sexual Activity: Not on file   Other Topics Concern  . Not on file   Social History Narrative   Pt lives in [place] alone.  Divorced.    Retired from [employer])       Family History  Problem Relation Age of Onset  . Cancer Mother   . Cancer Father   . Other Brother     NO MEDICAL PROBLEMS  . Other Son     NO MEDICAL PROBLEMS  . Leukemia Daughter 6    Recover and has no other problems    ROS- All systems are reviewed and negative except as per the HPI above  Physical Exam: Filed Vitals:   12/12/15 1008  BP: 118/72  Pulse: 78  Height: '6\' 1"'$  (1.854 m)  Weight: 236 lb 12.8 oz (107.412 kg)    GEN- The patient is well appearing, alert and oriented x 3 today.   Head- normocephalic, atraumatic Eyes-  Sclera clear, conjunctiva pink Ears- hearing intact Oropharynx- clear Neck- supple, no JVP Lymph- no cervical lymphadenopathy Lungs- Clear to ausculation bilaterally, normal work of breathing. Breath sounds diminished rt base. Heart- Slightly irregular rate and rhythm, no murmurs, rubs or gallops, PMI not laterally displaced GI- soft, NT, ND, + BS Extremities- no clubbing, cyanosis, or edema MS- no significant deformity or atrophy Skin- no rash or lesion Psych- euthymic mood, full affect Neuro- strength and sensation are intact  EKG- Appears to be afib with vrs sinus with arrhythmia, v rate at 78 bpm, but he has had sinus arrhythmia in past with difficult to see P waves. Epic records reviewed  Assessment and Plan: 1. afib Continue mutaq Continue xarelto Repeat EKG in 2 weeks  2. HTN Controlled  3. Dyspnea Contributes to iron deficiency Has been going on for months  CXR today Will draw CBC and forward to PCP    Butch Penny C. Jannatul Wojdyla, [facility] [address] [place], [place] [address] [phone]

## 2015-12-19 ENCOUNTER — Ambulatory Visit: Payer: Medicare Other | Admitting: Family Medicine

## 2015-12-25 ENCOUNTER — Ambulatory Visit (HOSPITAL_COMMUNITY)
Admission: RE | Admit: 2015-12-25 | Discharge: 2015-12-25 | Disposition: A | Discharge status: Home / Self Care | Payer: Medicare Other | Source: Ambulatory Visit | Attending: Nurse Practitioner | Admitting: Nurse Practitioner

## 2015-12-25 DIAGNOSIS — R000 Tachycardia, unspecified: Secondary | ICD-10-CM | POA: Diagnosis not present

## 2015-12-25 DIAGNOSIS — I480 Paroxysmal atrial fibrillation: Secondary | ICD-10-CM

## 2015-12-25 DIAGNOSIS — I4891 Unspecified atrial fibrillation: Secondary | ICD-10-CM | POA: Diagnosis not present

## 2015-12-25 DIAGNOSIS — I4510 Unspecified right bundle-branch block: Secondary | ICD-10-CM | POA: Diagnosis not present

## 2015-12-25 DIAGNOSIS — I493 Ventricular premature depolarization: Secondary | ICD-10-CM | POA: Insufficient documentation

## 2015-12-25 NOTE — Progress Notes (Addendum)
Patient in for repeat ekg. Roderic Palau NP to review  EKG is probable afib with controlled rate at 84 bpm. Pt feels ok. Will discuss with Dr. Rayann Heman if he is to stop multaq or try repat cardioversion or just rate control. Pt said he would be ok for any of those choices.I will get back to him.   Ekg reviewed with Dr. Rayann Heman and he feels that the rhythm is probably Sinus with PAC's. No changes needed.

## 2016-01-14 ENCOUNTER — Encounter: Payer: Self-pay | Admitting: Family Medicine

## 2016-01-14 DIAGNOSIS — D509 Iron deficiency anemia, unspecified: Secondary | ICD-10-CM

## 2016-01-15 DIAGNOSIS — D509 Iron deficiency anemia, unspecified: Secondary | ICD-10-CM | POA: Diagnosis not present

## 2016-01-15 LAB — CBC
HCT: 40.7 % (ref 38.5–50.0)
Hemoglobin: 13.1 g/dL — ABNORMAL LOW (ref 13.2–17.1)
MCH: 27.5 pg (ref 27.0–33.0)
MCHC: 32.2 g/dL (ref 32.0–36.0)
MCV: 85.3 fL (ref 80.0–100.0)
MPV: 10.1 fL (ref 7.5–12.5)
Platelets: 231 10*3/uL (ref 140–400)
RBC: 4.77 MIL/uL (ref 4.20–5.80)
RDW: 17.3 % — ABNORMAL HIGH (ref 11.0–15.0)
WBC: 7.6 10*3/uL (ref 3.8–10.8)

## 2016-01-15 LAB — IRON AND TIBC
%SAT: 5 % — ABNORMAL LOW (ref 15–60)
Iron: 20 ug/dL — ABNORMAL LOW (ref 50–180)
TIBC: 416 ug/dL (ref 250–425)
UIBC: 396 ug/dL (ref 125–400)

## 2016-01-15 LAB — FERRITIN: Ferritin: 12 ng/mL — ABNORMAL LOW (ref 20–380)

## 2016-01-16 ENCOUNTER — Encounter: Payer: Self-pay | Admitting: Family Medicine

## 2016-01-16 NOTE — Telephone Encounter (Signed)
Quick Note:  Hemoglobin is slightly improved however overall iron stores are still pretty low. We may consider a trial of IV iron. Would you like me to make a referral to hematology oncology for consideration of IV iron therapy. ______

## 2016-01-22 ENCOUNTER — Encounter: Payer: Self-pay | Admitting: Internal Medicine

## 2016-03-09 ENCOUNTER — Other Ambulatory Visit: Payer: Self-pay | Admitting: Family Medicine

## 2016-03-17 ENCOUNTER — Encounter: Payer: Self-pay | Admitting: Family Medicine

## 2016-03-17 DIAGNOSIS — D509 Iron deficiency anemia, unspecified: Secondary | ICD-10-CM

## 2016-03-17 DIAGNOSIS — E785 Hyperlipidemia, unspecified: Secondary | ICD-10-CM

## 2016-03-17 DIAGNOSIS — R7303 Prediabetes: Secondary | ICD-10-CM

## 2016-03-17 DIAGNOSIS — IMO0001 Reserved for inherently not codable concepts without codable children: Secondary | ICD-10-CM

## 2016-03-18 DIAGNOSIS — E785 Hyperlipidemia, unspecified: Secondary | ICD-10-CM | POA: Diagnosis not present

## 2016-03-18 DIAGNOSIS — Z0000 Encounter for general adult medical examination without abnormal findings: Secondary | ICD-10-CM | POA: Diagnosis not present

## 2016-03-18 DIAGNOSIS — D509 Iron deficiency anemia, unspecified: Secondary | ICD-10-CM | POA: Diagnosis not present

## 2016-03-18 DIAGNOSIS — R7303 Prediabetes: Secondary | ICD-10-CM | POA: Diagnosis not present

## 2016-03-20 LAB — LIPID PANEL
Cholesterol: 102 mg/dL — ABNORMAL LOW (ref 125–200)
HDL: 47 mg/dL (ref >=40)
LDL Cholesterol: 41 mg/dL (ref <130)
Total CHOL/HDL Ratio: 2.2 Ratio (ref <=5.0)
Triglycerides: 71 mg/dL (ref <150)
VLDL: 14 mg/dL (ref <30)

## 2016-03-20 LAB — COMPREHENSIVE METABOLIC PANEL
ALT: 11 U/L (ref 9–46)
AST: 17 U/L (ref 10–35)
Albumin: 4.1 g/dL (ref 3.6–5.1)
Alkaline Phosphatase: 71 U/L (ref 40–115)
BUN: 12 mg/dL (ref 7–25)
CO2: 26 mmol/L (ref 20–31)
Calcium: 9.3 mg/dL (ref 8.6–10.3)
Chloride: 102 mmol/L (ref 98–110)
Creat: 1.32 mg/dL — ABNORMAL HIGH (ref 0.70–1.25)
Glucose, Bld: 129 mg/dL — ABNORMAL HIGH (ref 65–99)
Potassium: 4.6 mmol/L (ref 3.5–5.3)
Sodium: 139 mmol/L (ref 135–146)
Total Bilirubin: 1 mg/dL (ref 0.2–1.2)
Total Protein: 7.2 g/dL (ref 6.1–8.1)

## 2016-03-20 LAB — CBC
HCT: 43.2 % (ref 38.5–50.0)
Hemoglobin: 14.2 g/dL (ref 13.2–17.1)
MCH: 29.4 pg (ref 27.0–33.0)
MCHC: 32.9 g/dL (ref 32.0–36.0)
MCV: 89.4 fL (ref 80.0–100.0)
MPV: 10.4 fL (ref 7.5–12.5)
Platelets: 196 10*3/uL (ref 140–400)
RBC: 4.83 MIL/uL (ref 4.20–5.80)
RDW: 21.3 % — ABNORMAL HIGH (ref 11.0–15.0)
WBC: 8.6 10*3/uL (ref 3.8–10.8)

## 2016-03-20 LAB — IRON AND TIBC
%SAT: 18 % (ref 15–60)
Iron: 68 ug/dL (ref 50–180)
TIBC: 373 ug/dL (ref 250–425)
UIBC: 305 ug/dL (ref 125–400)

## 2016-03-20 LAB — FERRITIN: Ferritin: 18 ng/mL — ABNORMAL LOW (ref 20–380)

## 2016-03-21 LAB — HEMOGLOBIN A1C
Hgb A1c MFr Bld: 6.8 % — ABNORMAL HIGH (ref <5.7)
Mean Plasma Glucose: 148 mg/dL

## 2016-03-21 NOTE — Telephone Encounter (Signed)
Quick Note:  Labs are stable to slightly worse over the last 6 months. We will discuss further at your well visit in August. ______

## 2016-04-02 ENCOUNTER — Encounter: Payer: Self-pay | Admitting: Family Medicine

## 2016-04-02 ENCOUNTER — Ambulatory Visit (INDEPENDENT_AMBULATORY_CARE_PROVIDER_SITE_OTHER): Payer: Medicare Other | Admitting: Family Medicine

## 2016-04-02 VITALS — BP 126/74 | HR 84 | Ht 73.0 in | Wt 234.0 lb

## 2016-04-02 DIAGNOSIS — Z0000 Encounter for general adult medical examination without abnormal findings: Secondary | ICD-10-CM

## 2016-04-02 MED ORDER — GABAPENTIN 300 MG PO CAPS: 600.0000 mg | ORAL_CAPSULE | Freq: Every day | ORAL | 3 refills | Status: DC

## 2016-04-02 MED ORDER — IPRATROPIUM BROMIDE 0.06 % NA SOLN: 2.0000 | NASAL | 6 refills | Status: DC | PRN

## 2016-04-02 NOTE — Patient Instructions (Signed)
Thank you for coming in today. Return in 3 months for diabetes recheck. Let me know when you get the flu vaccine.  Take zyrtec daily and use one of the steroid nasal sprays like flonase, rhinocort or nasonex. Use rx Atrovent nasal spray.  Wean off the affrin nasal spray.    Consider a diabetic educator. Let me know if you want a referral.  Research a low carb diabetic diet.

## 2016-04-02 NOTE — Progress Notes (Signed)
Subjective:    [name] is a 70 y.o. male who presents for Medicare Annual/Subsequent preventive examination.   Preventive Screening-Counseling & Management  Tobacco History  Smoking Status  . Former Smoker  . Quit date: 09/01/1994  Smokeless Tobacco  . Never Used    Problems Prior to Visit 1. Sinus irritation, drainage, congestion. Present for months.   2.Sore throat for the last few days.   Current Problems (verified) Patient Active Problem List   Diagnosis Date Noted  . Diabetic neuropathy ([place]) 10/17/2015  . Anemia, iron deficiency 10/17/2015  . Chronic kidney disease (CKD) stage G3a/A1, moderately decreased glomerular filtration rate (GFR) between 45-59 mL/min/1.73 square meter and albuminuria creatinine ratio less than 30 mg/g 09/17/2015  . BP (high blood pressure) 09/14/2015  . Left ventricular dysfunction 09/14/2015  . Pulmonary hypertension ([place]) 09/14/2015  . Insomnia 09/14/2015  . RLS (restless legs syndrome) 09/14/2015  . Myocardial infarction ([place]) 04/25/2014  . Coronary artery disease 05/27/2013  . Status post coronary artery bypass grafting x 7, 11/16/1996 05/27/2013  . Peripheral artery disease ([place]) 05/27/2013  . Hyperlipidemia 05/27/2013  . Diabetes mellitus with renal complications ([place]) 16/06/9603  . Paroxysmal atrial fibrillation ([place]) 11/17/2012  . Long term current use of anticoagulant therapy 11/17/2012  . Bundle branch block, right 05/24/2011  . Apnea, sleep 05/24/2011    Medications Prior to Visit Current Outpatient Prescriptions on File Prior to Visit  Medication Sig Dispense Refill  . atorvastatin (LIPITOR) 10 MG tablet TK 1 T PO QD  2  . dronedarone (MULTAQ) 400 MG tablet Take 1 tablet (400 mg total) by mouth 2 (two) times daily with a meal. 180 tablet 3  . furosemide (LASIX) 40 MG tablet Take 40 mg by mouth daily as needed (swelling). Reported on 09/14/2015    . Iron-Folic [id number] (FERRAPLUS 90) 90-1 MG TABS Take 1 tablet  by mouth 3 (three) times daily. 90 tablet 3  . lisinopril-hydrochlorothiazide (PRINZIDE,ZESTORETIC) 10-12.5 MG tablet TAKE 1 TABLET BY MOUTH DAILY. TAKE AS DIRECTED. 90 tablet 3  . metoprolol succinate (TOPROL-XL) 100 MG 24 hr tablet TAKE 1 TABLET EVERY DAY 90 tablet 3  . Multiple Vitamin tablet Take 1 tablet by mouth daily.    . niacin (NIASPAN) 1000 MG CR tablet TAKE 1 TABLET BY MOUTH AT BEDTIME 90 tablet 3  . NON FORMULARY CPAP at night    . rivaroxaban (XARELTO) 20 MG TABS tablet Take 1 tablet (20 mg total) by mouth daily with supper. 90 tablet 3   No current facility-administered medications on file prior to visit.     Current Medications (verified) Current Outpatient Prescriptions  Medication Sig Dispense Refill  . atorvastatin (LIPITOR) 10 MG tablet TK 1 T PO QD  2  . dronedarone (MULTAQ) 400 MG tablet Take 1 tablet (400 mg total) by mouth 2 (two) times daily with a meal. 180 tablet 3  . furosemide (LASIX) 40 MG tablet Take 40 mg by mouth daily as needed (swelling). Reported on 09/14/2015    . gabapentin (NEURONTIN) 300 MG capsule Take 2 capsules (600 mg total) by mouth at bedtime. 180 capsule 3  . Iron-Folic [id number] (FERRAPLUS 90) 90-1 MG TABS Take 1 tablet by mouth 3 (three) times daily. 90 tablet 3  . lisinopril-hydrochlorothiazide (PRINZIDE,ZESTORETIC) 10-12.5 MG tablet TAKE 1 TABLET BY MOUTH DAILY. TAKE AS DIRECTED. 90 tablet 3  . metoprolol succinate (TOPROL-XL) 100 MG 24 hr tablet TAKE 1 TABLET EVERY DAY 90 tablet 3  . Multiple Vitamin tablet Take 1  tablet by mouth daily.    . niacin (NIASPAN) 1000 MG CR tablet TAKE 1 TABLET BY MOUTH AT BEDTIME 90 tablet 3  . NON FORMULARY CPAP at night    . rivaroxaban (XARELTO) 20 MG TABS tablet Take 1 tablet (20 mg total) by mouth daily with supper. 90 tablet 3  . ipratropium (ATROVENT) 0.06 % nasal spray Place 2 sprays into both nostrils every 4 (four) hours as needed for rhinitis. 10 mL 6   No current facility-administered  medications for this visit.      Allergies (verified) Review of patient's allergies indicates no known allergies.   PAST HISTORY  Family History Family History  Problem Relation Age of Onset  . Cancer Mother   . Cancer Father   . Other Brother     NO MEDICAL PROBLEMS  . Other Son     NO MEDICAL PROBLEMS  . Leukemia Daughter 6    Recover and has no other problems    Social History Social History  Substance Use Topics  . Smoking status: Former Smoker    Quit date: 09/01/1994  . Smokeless tobacco: Never Used  . Alcohol use 0.6 - 1.2 oz/week    1 - 2 Glasses of wine per week     Comment: occasional    Are there smokers in your home (other than you)?  No  Risk Factors Current exercise habits: Walking Dietary issues discussed: Diabetic diet   Cardiac risk factors: advanced age (older than 81 for men, 110 for women), dyslipidemia, male gender and obesity (BMI >= 30 kg/m2).  Depression Screen (Note: if answer to either of the following is "Yes", a more complete depression screening is indicated)   Q1: Over the past two weeks, have you felt down, depressed or hopeless? No  Q2: Over the past two weeks, have you felt little interest or pleasure in doing things? No  Have you lost interest or pleasure in daily life? No  Do you often feel hopeless? No  Do you cry easily over simple problems? No  Activities of Daily Living In your present state of health, do you have any difficulty performing the following activities?:  Driving? No Managing money?  No Feeding yourself? No Getting from bed to chair? No Climbing a flight of stairs? No Preparing food and eating?: No Bathing or showering? No Getting dressed: No Getting to the toilet? No Using the toilet:No Moving around from place to place: No In the past year have you fallen or had a near fall?:No   Are you sexually active?  Yes  Do you have more than one partner?  No  Hearing Difficulties: No Do you often ask people to  speak up or repeat themselves? No Do you experience ringing or noises in your ears? Yes Do you have difficulty understanding soft or whispered voices? No   Do you feel that you have a problem with memory? No  Do you often misplace items? No  Do you feel safe at home?  Yes  Cognitive Testing  Alert? Yes  Normal Appearance?Yes  Oriented to person? Yes  Place? Yes   Time? Yes  Recall of three objects?  Yes  Can perform simple calculations? Yes  Displays appropriate judgment?Yes  Can read the correct time from a watch face?Yes   Advanced Directives have been discussed with the patient? Yes   List the Names of Other Physician/Practitioners you currently use: 1.    Indicate any recent Medical Services you may have received from  other than [facility] providers in the past year (date may be approximate).  Immunization History  Administered Date(s) Administered  . Influenza-Unspecified 06/02/2015  . Pneumococcal Conjugate-13 10/17/2015  . Pneumococcal Polysaccharide-23 12/09/2000  . Pneumococcal-Unspecified 07/17/2011  . Tdap 07/03/2011  . Varicella 09/16/2015  . Zoster 09/21/2015    Screening Tests Health Maintenance  Topic Date Due  . INFLUENZA VACCINE  04/01/2016  . OPHTHALMOLOGY EXAM  06/19/2016  . HEMOGLOBIN A1C  09/18/2016  . FOOT EXAM  10/16/2016  . PNA vac Low Risk Adult (2 of 2 - PPSV23) 10/16/2016  . COLONOSCOPY  06/27/2020  . TETANUS/TDAP  07/02/2021  . ZOSTAVAX  Completed  . Hepatitis C Screening  Completed    All answers were reviewed with the patient and necessary referrals were made:  Lynne Leader, MD   04/02/2016   History reviewed: allergies, current medications, past family history, past medical history, past social history, past surgical history and problem list  Review of Systems Pertinent items are noted in HPI. Pertinent items noted in HPI and remainder of comprehensive ROS otherwise negative.    Objective:     Vision by Snellen chart: right  eye:20/20, left eye:20/20   Blood pressure 126/74, pulse 84, height '6\' 1"'$  (1.854 m), weight 234 lb (106.1 kg), SpO2 93 %. Body mass index is 30.87 kg/m.  BP 126/74   Pulse 84   Ht '6\' 1"'$  (1.854 m)   Wt 234 lb (106.1 kg)   SpO2 93%   BMI 30.87 kg/m   General Appearance:    Alert, cooperative, no distress, appears stated age  Head:    Normocephalic, without obvious abnormality, atraumatic  Eyes:     conjunctiva/corneas clear, EOM's intact    Ears:    Normal TM's and external ear canals, both ears  Nose:   Nares normal, septum midline, mucosa normal, Postnasal drainage noted no  sinus tenderness  Throat:   Lips, mucosa, and tongue normal; teeth and gums normal  Neck:   Supple, symmetrical, trachea midline, no adenopathy;       thyroid:  No enlargement/tenderness/nodules; no carotid   bruit or JVD  Back:     Symmetric, no curvature, ROM normal, no CVA tenderness  Lungs:     Clear to auscultation bilaterally, respirations unlabored  Chest wall:    No tenderness or deformity  Heart:    Irregular rhythm normal rate S1 and S2 normal, no murmur, rub   or gallop  Abdomen:     Soft, non-tender, bowel sounds active all four quadrants,    no masses, no organomegaly  Genitalia:    Not examined   Rectal:    NNot examined   Extremities:   Extremities normal, atraumatic, no cyanosis or edema  Pulses:   2+ and symmetric all extremities  Skin:   Skin color, texture, turgor normal, no rashes or lesions  Lymph nodes:   Cervical, supraclavicular, and axillary nodes normal  Neurologic:  Normal strength, sensation   throughout       Lab Results  Component Value Date   HGBA1C 6.8 (H) 03/18/2016   Lab Results  Component Value Date   IRON 68 03/18/2016   TIBC 373 03/18/2016   FERRITIN 18 (L) 03/18/2016    Lab Results  Component Value Date   CHOL 102 (L) 03/18/2016   HDL 47 03/18/2016   LDLCALC 41 03/18/2016   TRIG 71 03/18/2016   CHOLHDL 2.2 03/18/2016      Chemistry      Component  Value Date/Time   NA 139 03/18/2016 0822   NA 142 04/18/2010   K 4.6 03/18/2016 0822   CL 102 03/18/2016 0822   CO2 26 03/18/2016 0822   BUN 12 03/18/2016 0822   CREATININE 1.32 (H) 03/18/2016 0822      Component Value Date/Time   CALCIUM 9.3 03/18/2016 0822   ALKPHOS 71 03/18/2016 0822   AST 17 03/18/2016 0822   ALT 11 03/18/2016 0822   BILITOT 1.0 03/18/2016 0822       Assessment:     Well adult. Patient has multiple chronic medical issues that have been previously examined and evaluated. His medical regimens appear to be up-to-date. His health maintenance items are also up-to-date.  The only acute issues today are sinus congestion drainage and mild sore throat. He appears to have a postnasal drainage. Plan to treat with Atrovent nasal spray Zyrtec and nasal steroids.     Plan:     During the course of the visit the patient was educated and counseled about appropriate screening and preventive services including:    Advanced directives: power of attorney for healthcare on file  Discussed referral to diabetic educator. Patient will think about it.   Patient Instructions (the written plan) was given to the patient.  Medicare Attestation I have personally reviewed: The patient's medical and social history Their use of alcohol, tobacco or illicit drugs Their current medications and supplements The patient's functional ability including ADLs,fall risks, home safety risks, cognitive, and hearing and visual impairment Diet and physical activities Evidence for depression or mood disorders  The patient's weight, height, BMI, and visual acuity have been recorded in the chart.  I have made referrals, counseling, and provided education to the patient based on review of the above and I have provided the patient with a written personalized care plan for preventive services.     Lynne Leader, MD   04/02/2016

## 2016-04-12 ENCOUNTER — Other Ambulatory Visit: Payer: Self-pay | Admitting: Internal Medicine

## 2016-04-12 DIAGNOSIS — I480 Paroxysmal atrial fibrillation: Secondary | ICD-10-CM

## 2016-05-17 ENCOUNTER — Other Ambulatory Visit: Payer: Self-pay | Admitting: Internal Medicine

## 2016-05-17 DIAGNOSIS — I480 Paroxysmal atrial fibrillation: Secondary | ICD-10-CM

## 2016-06-20 ENCOUNTER — Ambulatory Visit (INDEPENDENT_AMBULATORY_CARE_PROVIDER_SITE_OTHER): Payer: Medicare Other | Admitting: Cardiovascular Disease

## 2016-06-20 ENCOUNTER — Encounter: Payer: Self-pay | Admitting: Cardiovascular Disease

## 2016-06-20 VITALS — BP 118/60 | HR 71 | Ht 73.0 in | Wt 235.0 lb

## 2016-06-20 DIAGNOSIS — I701 Atherosclerosis of renal artery: Secondary | ICD-10-CM

## 2016-06-20 DIAGNOSIS — E7800 Pure hypercholesterolemia, unspecified: Secondary | ICD-10-CM | POA: Diagnosis not present

## 2016-06-20 DIAGNOSIS — I739 Peripheral vascular disease, unspecified: Secondary | ICD-10-CM

## 2016-06-20 DIAGNOSIS — Z951 Presence of aortocoronary bypass graft: Secondary | ICD-10-CM

## 2016-06-20 DIAGNOSIS — I4510 Unspecified right bundle-branch block: Secondary | ICD-10-CM

## 2016-06-20 DIAGNOSIS — I480 Paroxysmal atrial fibrillation: Secondary | ICD-10-CM | POA: Diagnosis not present

## 2016-06-20 NOTE — Assessment & Plan Note (Signed)
History of peripheral arterial disease status post bilateral iliac PTA and stenting remotely with restenting in 2007. He's also had right renal artery PTA and stenting. His last lower extremity arterial Doppler studies performed 06/14/14 revealed ABIs in the 0.9 range with patent stents. He denies claudication. He is quite active and golfs on a regular basis.

## 2016-06-20 NOTE — Assessment & Plan Note (Signed)
History of right renal artery stenosis and stenting in the past. He has not had renal Dopplers performed in several years.

## 2016-06-20 NOTE — Assessment & Plan Note (Signed)
Chronic. 

## 2016-06-20 NOTE — Progress Notes (Signed)
06/20/2016 [name]   04/24/1946  [id number]  Primary Physician Lynne Leader, MD Primary Cardiologist: Lorretta Harp MD Lupe Carney, [place]  HPI:  The patient is a very pleasant 70 year old, moderately overweight, divorced, Caucasian male father of 2, grandfather of 1 grandchild who I saw 06/27/15.[name] He has a history of CAD status post coronary artery bypass grafting x7 November 16, 1996. His other problems include PVOD status post bilateral iliac artery PTA and stenting by myself remotely with restenting in 2007. He has had right renal artery PTA and stenting as well. His other problems include hypertension, hyperlipidemia, non-insulin-requiring diabetes. He does have paroxysmal atrial fibrillation and has undergone multiple DC cardioversions as well as atrial fibrillation ablations by Dr. Samara Deist at [facility], most recently in May of last year, though he is now back in atrial fibrillation/flutter on a daily basis, which he is aware of but not symptomatic from. He is active and works out on a treadmill every day. He denies chest pain or shortness of breath. His last stress test performed one year ago which revealed an inferolateral scar without ischemia.  Since I saw him at 9 months ago has remained clinically stable. He denies chest pain, shortness of breath or claudication. He works out on a treadmill or plays golf daily. He is a Myoview stress test performed in October 2014 that showed inferolateral scar without ischemia unchanged from prior studies. Recent arterial Doppler studies of his lower extremities revealed ABIs in the 0.9 range bilaterally with patent iliac stents.he's noticed tachycardia over the last several months which on EKG yesterday is suggested A. Fib with RVR. He has been on Coumadin anticoagulation and has been therapeutic. I performed outpatient DC cardioversion on him 03/15/15 successfully back to sinus rhythm. He saw Dr. Rayann Heman subsequent to that and was placed on  Multaq antiarrhythmic therapy. His Coumadin was switched to Xarelto . He remains in sinus rhythm feeling clinically improved.   Current Outpatient Prescriptions  Medication Sig Dispense Refill  . atorvastatin (LIPITOR) 10 MG tablet TK 1 T PO QD  2  . furosemide (LASIX) 40 MG tablet Take 40 mg by mouth daily as needed (swelling). Reported on 09/14/2015    . gabapentin (NEURONTIN) 300 MG capsule Take 2 capsules (600 mg total) by mouth at bedtime. 180 capsule 3  . lisinopril-hydrochlorothiazide (PRINZIDE,ZESTORETIC) 10-12.5 MG tablet TAKE 1 TABLET BY MOUTH DAILY. TAKE AS DIRECTED. 90 tablet 3  . metoprolol succinate (TOPROL-XL) 100 MG 24 hr tablet TAKE 1 TABLET EVERY DAY 90 tablet 3  . MULTAQ 400 MG tablet TAKE 1 TABLET BY MOUTH TWICE DAILY WITH FOOD 180 tablet 1  . Multiple Vitamin tablet Take 1 tablet by mouth daily.    . niacin (NIASPAN) 1000 MG CR tablet TAKE 1 TABLET BY MOUTH AT BEDTIME 90 tablet 3  . NON FORMULARY CPAP at night    . XARELTO 20 MG TABS tablet TAKE 1 TABLET BY MOUTH EVERY DAY WITH SUPPER 90 tablet 2   No current facility-administered medications for this visit.     No Known Allergies  Social History   Social History  . Marital status: Divorced    Spouse name: N/A  . Number of children: N/A  . Years of education: N/A   Occupational History  . Not on file.   Social History Main Topics  . Smoking status: Former Smoker    Quit date: 09/01/1994  . Smokeless tobacco: Never Used  . Alcohol use 0.6 -  1.2 oz/week    1 - 2 Glasses of wine per week     Comment: occasional  . Drug use: No  . Sexual activity: Not on file   Other Topics Concern  . Not on file   Social History Narrative   Pt lives in [place] alone.  Divorced.   Retired from [employer])        Review of Systems: General: negative for chills, fever, night sweats or weight changes.  Cardiovascular: negative for chest pain, dyspnea on exertion, edema, orthopnea, palpitations,  paroxysmal nocturnal dyspnea or shortness of breath Dermatological: negative for rash Respiratory: negative for cough or wheezing Urologic: negative for hematuria Abdominal: negative for nausea, vomiting, diarrhea, bright red blood per rectum, melena, or hematemesis Neurologic: negative for visual changes, syncope, or dizziness All other systems reviewed and are otherwise negative except as noted above.    Blood pressure 118/60, pulse 71, height '6\' 1"'$  (1.854 m), weight 235 lb (106.6 kg).  General appearance: alert and no distress Neck: no adenopathy, no carotid bruit, no JVD, supple, symmetrical, trachea midline and thyroid not enlarged, symmetric, no tenderness/mass/nodules Lungs: clear to auscultation bilaterally Heart: regular rate and rhythm, S1, S2 normal, no murmur, click, rub or gallop Extremities: extremities normal, atraumatic, no cyanosis or edema  EKG normal sinus rhythm at 71 with right bundle branch block and sinus arrhythmia. I personally reviewed this EKG  ASSESSMENT AND PLAN:   Paroxysmal atrial fibrillation History of atrial fibrillation the past status post cardioversion by myself 03/15/15. He assessed subtotally change from amiodarone to Multaq and Coumadin to Xarelto. He has maintained sinus rhythm over the past year and feels completely improved..  Status post coronary artery bypass grafting x 7, 11/16/1996 History of coronary artery disease status post coronary artery bypass grafting X 7 11/16/96. His last Myoview performed October 2014 showed inferolateral scar without ischemia unchanged from prior studies. He denies chest pain or shortness of breath.   Peripheral artery disease History of peripheral arterial disease status post bilateral iliac PTA and stenting remotely with restenting in 2007. He's also had right renal artery PTA and stenting. His last lower extremity arterial Doppler studies performed 06/14/14 revealed ABIs in the 0.9 range with patent stents. He  denies claudication. He is quite active and golfs on a regular basis.  Hyperlipidemia History of hyperlipidemia on statin therapy with recent lipid profile performed 03/18/16 revealing total cholesterol 107, LDL 41 and HDL of 47.  Bundle branch block, right Chronic  Left renal artery stenosis (HCC) History of right renal artery stenosis and stenting in the past. He has not had renal Dopplers performed in several years.      Lorretta Harp MD FACP,FACC,FAHA, [facility] 06/20/2016 11:03 AM

## 2016-06-20 NOTE — Assessment & Plan Note (Signed)
History of coronary artery disease status post coronary artery bypass grafting X 7 11/16/96. His last Myoview performed October 2014 showed inferolateral scar without ischemia unchanged from prior studies. He denies chest pain or shortness of breath.

## 2016-06-20 NOTE — Patient Instructions (Signed)
Medication Instructions:  Continue current medications.   Follow-Up: Your physician wants you to follow-up in: [place].   You will receive a reminder letter in the mail two months in advance. If you don't receive a letter, please call our office to schedule the follow-up appointment.   If you need a refill on your cardiac medications before your next appointment, please call your pharmacy.

## 2016-06-20 NOTE — Assessment & Plan Note (Signed)
History of atrial fibrillation the past status post cardioversion by myself 03/15/15. He assessed subtotally change from amiodarone to Multaq and Coumadin to Xarelto. He has maintained sinus rhythm over the past year and feels completely improved.[name]

## 2016-06-20 NOTE — Assessment & Plan Note (Signed)
History of hyperlipidemia on statin therapy with recent lipid profile performed 03/18/16 revealing total cholesterol 107, LDL 41 and HDL of 47.

## 2016-06-24 ENCOUNTER — Other Ambulatory Visit: Payer: Self-pay | Admitting: Internal Medicine

## 2016-06-25 NOTE — Telephone Encounter (Signed)
Rx(s) sent to pharmacy electronically.  

## 2016-07-12 ENCOUNTER — Other Ambulatory Visit: Payer: Self-pay | Admitting: Internal Medicine

## 2016-07-29 ENCOUNTER — Other Ambulatory Visit: Payer: Self-pay | Admitting: Internal Medicine

## 2016-07-31 ENCOUNTER — Ambulatory Visit (INDEPENDENT_AMBULATORY_CARE_PROVIDER_SITE_OTHER): Payer: Medicare Other

## 2016-07-31 ENCOUNTER — Ambulatory Visit (INDEPENDENT_AMBULATORY_CARE_PROVIDER_SITE_OTHER): Payer: Medicare Other | Admitting: Family Medicine

## 2016-07-31 ENCOUNTER — Encounter: Payer: Self-pay | Admitting: Family Medicine

## 2016-07-31 VITALS — BP 126/69 | HR 69 | Temp 98.7°F | Wt 233.0 lb

## 2016-07-31 DIAGNOSIS — R05 Cough: Secondary | ICD-10-CM

## 2016-07-31 DIAGNOSIS — R059 Cough, unspecified: Secondary | ICD-10-CM

## 2016-07-31 MED ORDER — CEFDINIR 300 MG PO CAPS: 300.0000 mg | ORAL_CAPSULE | Freq: Two times a day (BID) | ORAL | 0 refills | Status: DC

## 2016-07-31 NOTE — Progress Notes (Signed)
[name] is a 70 y.o. male who presents to [place]: [place] today for cough and nasal congestion.  He had the onset of cough with milky phlegm and nasal congestion 2 weeks ago. He has also had low grade fevers and chills every other day. No chest pain, nausea, vomiting, diarrhea. He has had many sick friends recently, many of whom have been on azithromycin. He has found flonase helpful for his sinuses and mucinex temporarily helpful for the cough.  Past Medical History:  Diagnosis Date  . Atrial fibrillation ([place])   . Coronary artery disease   . DM2 (diabetes mellitus, type 2) (HCC)    diet controlled  . HLD (hyperlipidemia)   . HTN (hypertension)   . OSA (obstructive sleep apnea)    uses CPAP  . Peripheral artery disease ([place])    pseudoaneurysm post afib ablation at [facility] 2011, s/p bilateral iliac stents  . Renal artery stenosis (HCC)    right renal artery PTA and stenting  . S/P coronary artery bypass graft x 7 11/16/96   Past Surgical History:  Procedure Laterality Date  . 2-D echocardiogram  03/25/2010   Normal left ventricular function. Mild MR, TR, trivial AR  . ATRIAL FIBRILLATION ABLATION  03/27/2010, 12/31/2010   [facility], Dr. Nadeen Landau  . BACK SURGERY  2007  . cardiac stress test  11/21/2009   Exercise capacity 5 METS. No significant ischemia demonstrated  . CARDIOVERSION N/A 03/15/2015   Procedure: CARDIOVERSION;  Surgeon: Lorretta Harp, MD;  Location: [place];  Service: Cardiovascular;  Laterality: N/A;  . CORONARY ARTERY BYPASS GRAFT  1998  . ORCHIECTOMY  1981   for cancer  . TOOTH EXTRACTION  05/27/13   tooth extraction with bone graft   Social History  Substance Use Topics  . Smoking status: Former Smoker    Quit date: 09/01/1994  . Smokeless tobacco: Never Used  . Alcohol use 0.6 - 1.2 oz/week    1 - 2 Glasses of wine per week   Comment: occasional   family history includes Cancer in his father and mother; Leukemia (age of onset: 14) in his daughter; Other in his brother and son.  ROS as above:  Medications: Current Outpatient Prescriptions  Medication Sig Dispense Refill  . atorvastatin (LIPITOR) 10 MG tablet Take 1 tablet (10 mg total) by mouth daily. 90 tablet 3  . furosemide (LASIX) 40 MG tablet Take 40 mg by mouth daily as needed (swelling). Reported on 09/14/2015    . gabapentin (NEURONTIN) 300 MG capsule Take 2 capsules (600 mg total) by mouth at bedtime. 180 capsule 3  . lisinopril-hydrochlorothiazide (PRINZIDE,ZESTORETIC) 10-12.5 MG tablet TAKE 1 TABLET BY MOUTH EVERY DAY AS DIRECTED 90 tablet 3  . metoprolol succinate (TOPROL-XL) 100 MG 24 hr tablet Take 1 tablet (100 mg total) by mouth daily. 90 tablet 3  . MULTAQ 400 MG tablet TAKE 1 TABLET BY MOUTH TWICE DAILY WITH FOOD 180 tablet 1  . Multiple Vitamin tablet Take 1 tablet by mouth daily.    . niacin (NIASPAN) 1000 MG CR tablet TAKE 1 TABLET BY MOUTH EVERY DAY AT BEDTIME 90 tablet 3  . NON FORMULARY CPAP at night    . XARELTO 20 MG TABS tablet TAKE 1 TABLET BY MOUTH EVERY DAY WITH SUPPER 90 tablet 2  . cefdinir (OMNICEF) 300 MG capsule Take 1 capsule (300 mg total) by mouth 2 (two) times daily. 14 capsule 0  No current facility-administered medications for this visit.    No Known Allergies  Health Maintenance Health Maintenance  Topic Date Due  . OPHTHALMOLOGY EXAM  06/19/2016  . HEMOGLOBIN A1C  09/18/2016  . FOOT EXAM  10/16/2016  . PNA vac Low Risk Adult (2 of 2 - PPSV23) 10/16/2016  . COLONOSCOPY  06/27/2020  . TETANUS/TDAP  07/02/2021  . INFLUENZA VACCINE  Completed  . ZOSTAVAX  Completed  . Hepatitis C Screening  Completed     Exam:  BP 126/69   Pulse 69   Temp 98.7 F (37.1 C) (Oral)   Wt 233 lb (105.7 kg)   SpO2 94%   BMI 30.74 kg/m  Gen: Well NAD HEENT: conjunctiva clear, EOMI,  MMM without erythema or exudates, tympanic  membranes clear bilaterally, no cervical lymphadenopathy, no tenderness to palpation over frontal and maxillary sinuses Lungs: Normal work of breathing. Coarse breath sounds. Heart: RRR no MRG Abd: NABS, Soft. Nondistended, Nontender Exts: Brisk capillary refill, warm and well perfused.    No results found for this or any previous visit (from the past 72 hour(s)). Dg Chest 2 View  Result Date: 07/31/2016 CLINICAL DATA:  Cough, congestion for 10 days EXAM: CHEST  2 VIEW COMPARISON:  12/12/2015 FINDINGS: Cardiomediastinal silhouette is stable. Status post CABG. No infiltrate or pleural effusion. No pulmonary edema. Osteopenia and degenerative changes mid thoracic spine. IMPRESSION: No active cardiopulmonary disease. Electronically Signed   By: Lahoma Crocker M.D.   On: 07/31/2016 16:30    Assessment and Plan: 70 y.o. male with cough, congestion, and low grade fevers for 2 weeks, likely due to acute bronchitis. Concern for pneumonia given carse breath sounds on exam, but CXR unremarkable. Also concern for QT prolongation with azithromycin and fluoroquinolones since patient is on multaq.  Carole Civil - Return if no improvement.  Orders Placed This Encounter  Procedures  . DG Chest 2 View    Order Specific Question:   Reason for exam:    Answer:   Cough, assess intra-thoracic pathology    Order Specific Question:   Preferred imaging location?    Answer:   [facility]    Discussed warning signs or symptoms. Please see discharge instructions. Patient expresses understanding.

## 2016-07-31 NOTE — Patient Instructions (Signed)
Thank you for coming in today. Get a Chest xray today.  Take omnicef twice daily.  Return if not better.  Call or go to the emergency room if you get worse, have trouble breathing, have chest pains, or palpitations.

## 2016-10-27 ENCOUNTER — Other Ambulatory Visit: Payer: Self-pay | Admitting: Nurse Practitioner

## 2016-10-27 DIAGNOSIS — I480 Paroxysmal atrial fibrillation: Secondary | ICD-10-CM

## 2016-12-24 ENCOUNTER — Ambulatory Visit (HOSPITAL_COMMUNITY)
Admission: RE | Admit: 2016-12-24 | Discharge: 2016-12-24 | Disposition: A | Discharge status: Home / Self Care | Payer: Medicare Other | Source: Ambulatory Visit | Attending: Nurse Practitioner | Admitting: Nurse Practitioner

## 2016-12-24 ENCOUNTER — Encounter (HOSPITAL_COMMUNITY): Payer: Self-pay | Admitting: Nurse Practitioner

## 2016-12-24 VITALS — BP 122/64 | HR 67 | Ht 73.0 in | Wt 229.2 lb

## 2016-12-24 DIAGNOSIS — I480 Paroxysmal atrial fibrillation: Secondary | ICD-10-CM | POA: Insufficient documentation

## 2016-12-24 DIAGNOSIS — Z7901 Long term (current) use of anticoagulants: Secondary | ICD-10-CM | POA: Diagnosis not present

## 2016-12-24 DIAGNOSIS — E1151 Type 2 diabetes mellitus with diabetic peripheral angiopathy without gangrene: Secondary | ICD-10-CM | POA: Diagnosis not present

## 2016-12-24 DIAGNOSIS — G4733 Obstructive sleep apnea (adult) (pediatric): Secondary | ICD-10-CM | POA: Diagnosis not present

## 2016-12-24 DIAGNOSIS — Z87891 Personal history of nicotine dependence: Secondary | ICD-10-CM | POA: Insufficient documentation

## 2016-12-24 DIAGNOSIS — I2510 Atherosclerotic heart disease of native coronary artery without angina pectoris: Secondary | ICD-10-CM | POA: Diagnosis not present

## 2016-12-24 DIAGNOSIS — Z806 Family history of leukemia: Secondary | ICD-10-CM | POA: Insufficient documentation

## 2016-12-24 DIAGNOSIS — Z79899 Other long term (current) drug therapy: Secondary | ICD-10-CM | POA: Diagnosis not present

## 2016-12-24 DIAGNOSIS — E785 Hyperlipidemia, unspecified: Secondary | ICD-10-CM | POA: Diagnosis not present

## 2016-12-24 DIAGNOSIS — Z951 Presence of aortocoronary bypass graft: Secondary | ICD-10-CM | POA: Diagnosis not present

## 2016-12-24 DIAGNOSIS — I10 Essential (primary) hypertension: Secondary | ICD-10-CM | POA: Insufficient documentation

## 2016-12-24 LAB — COMPREHENSIVE METABOLIC PANEL
ALT: 16 U/L — ABNORMAL LOW (ref 17–63)
AST: 35 U/L (ref 15–41)
Albumin: 4 g/dL (ref 3.5–5.0)
Alkaline Phosphatase: 79 U/L (ref 38–126)
Anion gap: 7 (ref 5–15)
BUN: 9 mg/dL (ref 6–20)
CO2: 29 mmol/L (ref 22–32)
Calcium: 9.4 mg/dL (ref 8.9–10.3)
Chloride: 100 mmol/L — ABNORMAL LOW (ref 101–111)
Creatinine, Ser: 1.21 mg/dL (ref 0.61–1.24)
GFR calc Af Amer: >60 mL/min (ref >60)
GFR calc non Af Amer: 60 mL/min — ABNORMAL LOW (ref >60)
Glucose, Bld: 144 mg/dL — ABNORMAL HIGH (ref 65–99)
Potassium: 5 mmol/L (ref 3.5–5.1)
Sodium: 136 mmol/L (ref 135–145)
Total Bilirubin: 1.4 mg/dL — ABNORMAL HIGH (ref 0.3–1.2)
Total Protein: 7.3 g/dL (ref 6.5–8.1)

## 2016-12-24 LAB — CBC
HCT: 46.1 % (ref 39.0–52.0)
Hemoglobin: 15.2 g/dL (ref 13.0–17.0)
MCH: 30.2 pg (ref 26.0–34.0)
MCHC: 33 g/dL (ref 30.0–36.0)
MCV: 91.5 fL (ref 78.0–100.0)
Platelets: 222 10*3/uL (ref 150–400)
RBC: 5.04 MIL/uL (ref 4.22–5.81)
RDW: 16.1 % — ABNORMAL HIGH (ref 11.5–15.5)
WBC: 9.5 10*3/uL (ref 4.0–10.5)

## 2016-12-24 NOTE — Progress Notes (Signed)
Patient ID: [name], male   DOB: 01-31-1946, 71 y.o.   MRN: [id number]     Primary Care Physician: Richard Leader, MD Referring Physician: Dr. Casandra Doffing Hogan is a 71 y.o. male with a h/o PAF, OSA using cpap, HTN, CAD, s/p bypass, s/p ablation x 3, that is here for f/u, on multaq. He has no afib to report.   Ekg shows hard to distinguish p waves but unchanged compared to his other EKG's which has been SR with PAC's.  Today, he denies symptoms of palpitations, chest pain, shortness of breath, orthopnea, PND, lower extremity edema, dizziness, presyncope, syncope, or neurologic sequela. The patient is tolerating medications without difficulties and is otherwise without complaint today.   Past Medical History:  Diagnosis Date  . Atrial fibrillation ([place])   . Coronary artery disease   . DM2 (diabetes mellitus, type 2) (HCC)    diet controlled  . HLD (hyperlipidemia)   . HTN (hypertension)   . OSA (obstructive sleep apnea)    uses CPAP  . Peripheral artery disease ([place])    pseudoaneurysm post afib ablation at [facility] 2011, s/p bilateral iliac stents  . Renal artery stenosis (HCC)    right renal artery PTA and stenting  . S/P coronary artery bypass graft x 7 11/16/96   Past Surgical History:  Procedure Laterality Date  . 2-D echocardiogram  03/25/2010   Normal left ventricular function. Mild MR, TR, trivial AR  . ATRIAL FIBRILLATION ABLATION  03/27/2010, 12/31/2010   [facility], Dr. Nadeen Hogan  . BACK SURGERY  2007  . cardiac stress test  11/21/2009   Exercise capacity 5 METS. No significant ischemia demonstrated  . CARDIOVERSION N/A 03/15/2015   Procedure: CARDIOVERSION;  Surgeon: Richard Harp, MD;  Location: [place];  Service: Cardiovascular;  Laterality: N/A;  . CORONARY ARTERY BYPASS GRAFT  1998  . ORCHIECTOMY  1981   for cancer  . TOOTH EXTRACTION  05/27/13   tooth extraction with bone graft    Current Outpatient Prescriptions  Medication Sig Dispense Refill  .  atorvastatin (LIPITOR) 10 MG tablet Take 1 tablet (10 mg total) by mouth daily. 90 tablet 3  . gabapentin (NEURONTIN) 300 MG capsule Take 2 capsules (600 mg total) by mouth at bedtime. 180 capsule 3  . lisinopril-hydrochlorothiazide (PRINZIDE,ZESTORETIC) 10-12.5 MG tablet TAKE 1 TABLET BY MOUTH EVERY DAY AS DIRECTED 90 tablet 3  . metoprolol succinate (TOPROL-XL) 100 MG 24 hr tablet Take 1 tablet (100 mg total) by mouth daily. 90 tablet 3  . MULTAQ 400 MG tablet TAKE 1 TABLET BY MOUTH TWICE DAILY WITH FOOD 180 tablet 0  . Multiple Vitamin tablet Take 1 tablet by mouth daily.    . niacin (NIASPAN) 1000 MG CR tablet TAKE 1 TABLET BY MOUTH EVERY DAY AT BEDTIME 90 tablet 3  . NON FORMULARY CPAP at night    . XARELTO 20 MG TABS tablet TAKE 1 TABLET BY MOUTH EVERY DAY WITH SUPPER 90 tablet 2  . furosemide (LASIX) 40 MG tablet Take 40 mg by mouth daily as needed (swelling). Reported on 09/14/2015     No current facility-administered medications for this encounter.     No Known Allergies  Social History   Social History  . Marital status: Divorced    Spouse name: N/A  . Number of children: N/A  . Years of education: N/A   Occupational History  . Not on file.   Social History Main Topics  . Smoking status: Former  Smoker    Quit date: 09/01/1994  . Smokeless tobacco: Never Used  . Alcohol use 0.6 - 1.2 oz/week    1 - 2 Glasses of wine per week     Comment: occasional  . Drug use: No  . Sexual activity: Not on file   Other Topics Concern  . Not on file   Social History Narrative   Pt lives in [place] alone.  Divorced.   Retired from [employer])       Family History  Problem Relation Age of Onset  . Cancer Mother   . Cancer Father   . Other Brother     NO MEDICAL PROBLEMS  . Other Son     NO MEDICAL PROBLEMS  . Leukemia Daughter 6    Recover and has no other problems    ROS- All systems are reviewed and negative except as per the HPI above  Physical  Exam: Vitals:   12/24/16 0946  BP: 122/64  Pulse: 67  Weight: 229 lb 3.2 oz (104 kg)  Height: '6\' 1"'$  (1.854 m)    GEN- The patient is well appearing, alert and oriented x 3 today.   Head- normocephalic, atraumatic Eyes-  Sclera clear, conjunctiva pink Ears- hearing intact Oropharynx- clear Neck- supple, no JVP Lymph- no cervical lymphadenopathy Lungs- Clear to ausculation bilaterally, normal work of breathing. Breath sounds diminished rt base. Heart- Slightly irregular rate and rhythm, no murmurs, rubs or gallops, PMI not laterally displaced GI- soft, NT, ND, + BS Extremities- no clubbing, cyanosis, or edema MS- no significant deformity or atrophy Skin- no rash or lesion Psych- euthymic mood, full affect Neuro- strength and sensation are intact  EKG- Sinus rhythm at 67 bpm, RBBB, pr int 132 ms, qrs int 138 ms, qtc 486 ms Epic records reviewed  Assessment and Plan: 1. afib maintaining SR Continue mutaq Continue xarelto cmet today for multaq use, as well as HA1c, cbc at request of PCP  2. HTN Controlled  3. CAD Stable No anginal symptoms   f/u in one year   [name], [facility] [address] [place], [place] [address] [phone]

## 2016-12-25 ENCOUNTER — Encounter: Payer: Self-pay | Admitting: Family Medicine

## 2016-12-25 LAB — HEMOGLOBIN A1C
Hgb A1c MFr Bld: 7 % — ABNORMAL HIGH (ref 4.8–5.6)
Mean Plasma Glucose: 154 mg/dL

## 2017-01-12 ENCOUNTER — Other Ambulatory Visit: Payer: Self-pay | Admitting: Internal Medicine

## 2017-01-12 DIAGNOSIS — I480 Paroxysmal atrial fibrillation: Secondary | ICD-10-CM

## 2017-01-12 NOTE — Telephone Encounter (Signed)
OV with [name] on 12/24/16. SCr 1.21. Wt 104Kg. Rx for Xarelto '20mg'$  QD done.

## 2017-01-29 ENCOUNTER — Other Ambulatory Visit: Payer: Self-pay | Admitting: Nurse Practitioner

## 2017-01-29 DIAGNOSIS — I480 Paroxysmal atrial fibrillation: Secondary | ICD-10-CM

## 2017-01-29 MED ORDER — DRONEDARONE HCL 400 MG PO TABS: 400.0000 mg | ORAL_TABLET | Freq: Two times a day (BID) | ORAL | 1 refills | Status: DC

## 2017-03-20 DIAGNOSIS — L089 Local infection of the skin and subcutaneous tissue, unspecified: Secondary | ICD-10-CM | POA: Diagnosis not present

## 2017-03-20 DIAGNOSIS — L723 Sebaceous cyst: Secondary | ICD-10-CM | POA: Diagnosis not present

## 2017-04-16 DIAGNOSIS — L723 Sebaceous cyst: Secondary | ICD-10-CM | POA: Diagnosis not present

## 2017-04-16 DIAGNOSIS — L720 Epidermal cyst: Secondary | ICD-10-CM | POA: Diagnosis not present

## 2017-05-06 ENCOUNTER — Other Ambulatory Visit: Payer: Self-pay | Admitting: Family Medicine

## 2017-05-24 ENCOUNTER — Encounter: Payer: Self-pay | Admitting: Family Medicine

## 2017-05-24 DIAGNOSIS — E118 Type 2 diabetes mellitus with unspecified complications: Secondary | ICD-10-CM

## 2017-05-24 DIAGNOSIS — D509 Iron deficiency anemia, unspecified: Secondary | ICD-10-CM

## 2017-05-28 DIAGNOSIS — D509 Iron deficiency anemia, unspecified: Secondary | ICD-10-CM | POA: Diagnosis not present

## 2017-05-28 DIAGNOSIS — E118 Type 2 diabetes mellitus with unspecified complications: Secondary | ICD-10-CM | POA: Diagnosis not present

## 2017-05-29 ENCOUNTER — Other Ambulatory Visit: Payer: Self-pay

## 2017-05-29 LAB — COMPLETE METABOLIC PANEL WITH GFR
AG Ratio: 1.3 (calc) (ref 1.0–2.5)
ALT: 13 U/L (ref 9–46)
AST: 22 U/L (ref 10–35)
Albumin: 4 g/dL (ref 3.6–5.1)
Alkaline phosphatase (APISO): 74 U/L (ref 40–115)
BUN: 10 mg/dL (ref 7–25)
CO2: 30 mmol/L (ref 20–32)
Calcium: 9.4 mg/dL (ref 8.6–10.3)
Chloride: 98 mmol/L (ref 98–110)
Creat: 1.23 mg/dL (ref 0.70–1.25)
GFR, Est African American: 69 mL/min/{1.73_m2} (ref >=60)
GFR, Est Non African American: 60 mL/min/{1.73_m2} (ref >=60)
Globulin: 3.1 g/dL (calc) (ref 1.9–3.7)
Glucose, Bld: 133 mg/dL — ABNORMAL HIGH (ref 65–99)
Potassium: 4.5 mmol/L (ref 3.5–5.3)
Sodium: 136 mmol/L (ref 135–146)
Total Bilirubin: 0.9 mg/dL (ref 0.2–1.2)
Total Protein: 7.1 g/dL (ref 6.1–8.1)

## 2017-05-29 LAB — CBC
HCT: 39.8 % (ref 38.5–50.0)
Hemoglobin: 13.5 g/dL (ref 13.2–17.1)
MCH: 29.3 pg (ref 27.0–33.0)
MCHC: 33.9 g/dL (ref 32.0–36.0)
MCV: 86.5 fL (ref 80.0–100.0)
MPV: 11 fL (ref 7.5–12.5)
Platelets: 207 10*3/uL (ref 140–400)
RBC: 4.6 10*6/uL (ref 4.20–5.80)
RDW: 14.4 % (ref 11.0–15.0)
WBC: 6.8 10*3/uL (ref 3.8–10.8)

## 2017-05-29 LAB — IRON,TIBC AND FERRITIN PANEL
%SAT: 11 % (calc) — ABNORMAL LOW (ref 15–60)
Ferritin: 14 ng/mL — ABNORMAL LOW (ref 20–380)
Iron: 43 ug/dL — ABNORMAL LOW (ref 50–180)
TIBC: 385 mcg/dL (calc) (ref 250–425)

## 2017-05-29 LAB — LIPID PANEL W/REFLEX DIRECT LDL
Cholesterol: 108 mg/dL (ref <200)
HDL: 52 mg/dL (ref >40)
LDL Cholesterol (Calc): 41 mg/dL (calc)
Non-HDL Cholesterol (Calc): 56 mg/dL (calc) (ref <130)
Total CHOL/HDL Ratio: 2.1 (calc) (ref <5.0)
Triglycerides: 70 mg/dL (ref <150)

## 2017-05-29 LAB — HEMOGLOBIN A1C
Hgb A1c MFr Bld: 6.3 % of total Hgb — ABNORMAL HIGH (ref <5.7)
Mean Plasma Glucose: 134 (calc)
eAG (mmol/L): 7.4 (calc)

## 2017-05-29 MED ORDER — LISINOPRIL-HYDROCHLOROTHIAZIDE 10-12.5 MG PO TABS: 1.0000 | ORAL_TABLET | Freq: Every day | ORAL | 0 refills | Status: DC

## 2017-06-03 ENCOUNTER — Ambulatory Visit (INDEPENDENT_AMBULATORY_CARE_PROVIDER_SITE_OTHER): Payer: Medicare Other | Admitting: Family Medicine

## 2017-06-03 ENCOUNTER — Encounter: Payer: Self-pay | Admitting: Family Medicine

## 2017-06-03 ENCOUNTER — Other Ambulatory Visit: Payer: Self-pay | Admitting: Family Medicine

## 2017-06-03 VITALS — BP 105/61 | HR 73 | Ht 73.0 in | Wt 232.0 lb

## 2017-06-03 DIAGNOSIS — I480 Paroxysmal atrial fibrillation: Secondary | ICD-10-CM

## 2017-06-03 DIAGNOSIS — D509 Iron deficiency anemia, unspecified: Secondary | ICD-10-CM | POA: Diagnosis not present

## 2017-06-03 DIAGNOSIS — M545 Low back pain, unspecified: Secondary | ICD-10-CM

## 2017-06-03 DIAGNOSIS — N183 Chronic kidney disease, stage 3 (moderate): Secondary | ICD-10-CM

## 2017-06-03 DIAGNOSIS — R7303 Prediabetes: Secondary | ICD-10-CM

## 2017-06-03 DIAGNOSIS — Z0000 Encounter for general adult medical examination without abnormal findings: Secondary | ICD-10-CM | POA: Diagnosis not present

## 2017-06-03 DIAGNOSIS — N1831 Chronic kidney disease, stage 3a: Secondary | ICD-10-CM

## 2017-06-03 DIAGNOSIS — I272 Pulmonary hypertension, unspecified: Secondary | ICD-10-CM

## 2017-06-03 DIAGNOSIS — I701 Atherosclerosis of renal artery: Secondary | ICD-10-CM

## 2017-06-03 DIAGNOSIS — M549 Dorsalgia, unspecified: Secondary | ICD-10-CM

## 2017-06-03 DIAGNOSIS — I739 Peripheral vascular disease, unspecified: Secondary | ICD-10-CM

## 2017-06-03 DIAGNOSIS — E1122 Type 2 diabetes mellitus with diabetic chronic kidney disease: Secondary | ICD-10-CM | POA: Insufficient documentation

## 2017-06-03 DIAGNOSIS — G8929 Other chronic pain: Secondary | ICD-10-CM | POA: Diagnosis not present

## 2017-06-03 MED ORDER — ZOSTER VAC RECOMB ADJUVANTED 50 MCG/0.5ML IM SUSR
0.5000 mL | Freq: Once | INTRAMUSCULAR | 1 refills | Status: AC
Start: 2017-06-03 — End: 2017-06-03

## 2017-06-03 MED ORDER — FERRALET 90 90-1 MG PO TABS: 1.0000 | ORAL_TABLET | Freq: Every day | ORAL | 1 refills | Status: DC

## 2017-06-03 MED ORDER — TRAMADOL HCL 50 MG PO TABS: 50.0000 mg | ORAL_TABLET | Freq: Two times a day (BID) | ORAL | 2 refills | Status: DC | PRN

## 2017-06-03 NOTE — Patient Instructions (Addendum)
Thank you for coming in today.  Restart Iron.  We will want to recheck iron levels in about 3 months.   Use tramadol sparingly.   Continue current medicine.   Tramadol tablets What is this medicine? TRAMADOL (TRA ma dole) is a pain reliever. It is used to treat moderate to severe pain in adults. This medicine may be used for other purposes; ask your health care provider or pharmacist if you have questions. COMMON BRAND NAME(S): Ultram What should I tell my health care provider before I take this medicine? They need to know if you have any of these conditions: -brain tumor -depression -drug abuse or addiction -head injury -if you frequently drink alcohol containing drinks -kidney disease or trouble passing urine -liver disease -lung disease, asthma, or breathing problems -seizures or epilepsy -suicidal thoughts, plans, or attempt; a previous suicide attempt by you or a family member -an unusual or allergic reaction to tramadol, codeine, other medicines, foods, dyes, or preservatives -pregnant or trying to get pregnant -breast-feeding How should I use this medicine? Take this medicine by mouth with a full glass of water. Follow the directions on the prescription label. You can take it with or without food. If it upsets your stomach, take it with food. Do not take your medicine more often than directed. A special MedGuide will be given to you by the pharmacist with each prescription and refill. Be sure to read this information carefully each time. Talk to your pediatrician regarding the use of this medicine in children. Special care may be needed. Overdosage: If you think you have taken too much of this medicine contact a poison control center or emergency room at once. NOTE: This medicine is only for you. Do not share this medicine with others. What if I miss a dose? If you miss a dose, take it as soon as you can. If it is almost time for your next dose, take only that dose. Do not  take double or extra doses. What may interact with this medicine? Do not take this medication with any of the following medicines: -MAOIs like Carbex, Eldepryl, Marplan, Nardil, and Parnate This medicine may also interact with the following medications: -alcohol -antihistamines for allergy, cough and cold -certain medicines for anxiety or sleep -certain medicines for depression like amitriptyline, fluoxetine, sertraline -certain medicines for migraine headache like almotriptan, eletriptan, frovatriptan, naratriptan, rizatriptan, sumatriptan, zolmitriptan -certain medicines for seizures like carbamazepine, oxcarbazepine, phenobarbital, primidone -certain medicines that treat or prevent blood clots like warfarin -digoxin -furazolidone -general anesthetics like halothane, isoflurane, methoxyflurane, propofol -linezolid -local anesthetics like lidocaine, pramoxine, tetracaine -medicines that relax muscles for surgery -other narcotic medicines for pain or cough -phenothiazines like chlorpromazine, mesoridazine, prochlorperazine, thioridazine -procarbazine This list may not describe all possible interactions. Give your health care provider a list of all the medicines, herbs, non-prescription drugs, or dietary supplements you use. Also tell them if you smoke, drink alcohol, or use illegal drugs. Some items may interact with your medicine. What should I watch for while using this medicine? Tell your doctor or health care professional if your pain does not go away, if it gets worse, or if you have new or a different type of pain. You may develop tolerance to the medicine. Tolerance means that you will need a higher dose of the medicine for pain relief. Tolerance is normal and is expected if you take this medicine for a long time. Do not suddenly stop taking your medicine because you may develop a severe reaction. Your  body becomes used to the medicine. This does NOT mean you are addicted. Addiction is  a behavior related to getting and using a drug for a non-medical reason. If you have pain, you have a medical reason to take pain medicine. Your doctor will tell you how much medicine to take. If your doctor wants you to stop the medicine, the dose will be slowly lowered over time to avoid any side effects. There are different types of narcotic medicines (opiates). If you take more than one type at the same time or if you are taking another medicine that also causes drowsiness, you may have more side effects. Give your health care provider a list of all medicines you use. Your doctor will tell you how much medicine to take. Do not take more medicine than directed. Call emergency for help if you have problems breathing or unusual sleepiness. You may get drowsy or dizzy. Do not drive, use machinery, or do anything that needs mental alertness until you know how this medicine affects you. Do not stand or sit up quickly, especially if you are an older patient. This reduces the risk of dizzy or fainting spells. Alcohol can increase or decrease the effects of this medicine. Avoid alcoholic drinks. You may have constipation. Try to have a bowel movement at least every 2 to 3 days. If you do not have a bowel movement for 3 days, call your doctor or health care professional. Your mouth may get dry. Chewing sugarless gum or sucking hard candy, and drinking plenty of water may help. Contact your doctor if the problem does not go away or is severe. What side effects may I notice from receiving this medicine? Side effects that you should report to your doctor or health care professional as soon as possible: -allergic reactions like skin rash, itching or hives, swelling of the face, lips, or tongue -breathing problems -confusion -seizures -signs and symptoms of low blood pressure like dizziness; feeling faint or lightheaded, falls; unusually weak or tired -trouble passing urine or change in the amount of urine Side  effects that usually do not require medical attention (report to your doctor or health care professional if they continue or are bothersome): -constipation -dry mouth -nausea, vomiting -tiredness This list may not describe all possible side effects. Call your doctor for medical advice about side effects. You may report side effects to FDA at 1-800-FDA-1088. Where should I keep my medicine? Keep out of the reach of children. This medicine may cause accidental overdose and death if it taken by other adults, children, or pets. Mix any unused medicine with a substance like cat litter or coffee grounds. Then throw the medicine away in a sealed container like a sealed bag or a coffee can with a lid. Do not use the medicine after the expiration date. Store at room temperature between 15 and 30 degrees C (59 and 86 degrees F). NOTE: This sheet is a summary. It may not cover all possible information. If you have questions about this medicine, talk to your doctor, pharmacist, or health care provider.  2018 Elsevier/Gold Standard (2015-05-13 09:00:04)

## 2017-06-03 NOTE — Progress Notes (Signed)
HPI: [name] is a 71 y.o. male  who presents to [facility] today, 06/03/17,  for Medicare Annual Wellness Exam  Patient presents for annual physical/Medicare wellness exam.  1) Low Iron. [name] aches Xarelto daily and has had low iron in the past thought to be due to subtle slow GI bleeding. He had a colonoscopy a few years ago that was normal. He's had improved Iron with oral iron supplementation but has not been taking iron recently. He would like to avoid a repeat colonoscopy if possible.  2) chronic back pain: Patient has chronic intermittent back pain. The pain is typically worse with prolonged standing and walking. He notes the pain is worse after playing golf. He takes Tylenol already and is interested in low-dose intermittent tramadol if Possible.   Past medical, surgical, social and family history reviewed:  Patient Active Problem List   Diagnosis Date Noted  . Left renal artery stenosis ([place]) 06/20/2016  . Diabetic neuropathy ([place]) 10/17/2015  . Anemia, iron deficiency 10/17/2015  . Chronic kidney disease (CKD) stage G3a/A1, moderately decreased glomerular filtration rate (GFR) between 45-59 mL/min/1.73 square meter and albuminuria creatinine ratio less than 30 mg/g ([facility]) 09/17/2015  . BP (high blood pressure) 09/14/2015  . Left ventricular dysfunction 09/14/2015  . Pulmonary hypertension ([place]) 09/14/2015  . Insomnia 09/14/2015  . RLS (restless legs syndrome) 09/14/2015  . Myocardial infarction ([place]) 04/25/2014  . Coronary artery disease 05/27/2013  . Status post coronary artery bypass grafting x 7, 11/16/1996 05/27/2013  . Peripheral artery disease ([place]) 05/27/2013  . Hyperlipidemia 05/27/2013  . Diabetes mellitus with renal complications ([place]) 29/51/8841  . Paroxysmal atrial fibrillation ([place]) 11/17/2012  . Long term current use of anticoagulant therapy 11/17/2012  . Bundle branch block, right 05/24/2011  . Apnea, sleep  05/24/2011    Past Surgical History:  Procedure Laterality Date  . 2-D echocardiogram  03/25/2010   Normal left ventricular function. Mild MR, TR, trivial AR  . ATRIAL FIBRILLATION ABLATION  03/27/2010, 12/31/2010   [facility], Dr. Nadeen Landau  . BACK SURGERY  2007  . cardiac stress test  11/21/2009   Exercise capacity 5 METS. No significant ischemia demonstrated  . CARDIOVERSION N/A 03/15/2015   Procedure: CARDIOVERSION;  Surgeon: Lorretta Harp, MD;  Location: [facility];  Service: Cardiovascular;  Laterality: N/A;  . CORONARY ARTERY BYPASS GRAFT  1998  . ORCHIECTOMY  1981   for cancer  . TOOTH EXTRACTION  05/27/13   tooth extraction with bone graft    Social History   Social History  . Marital status: Divorced    Spouse name: N/A  . Number of children: N/A  . Years of education: N/A   Occupational History  . Not on file.   Social History Main Topics  . Smoking status: Former Smoker    Quit date: 09/01/1994  . Smokeless tobacco: Never Used  . Alcohol use 0.6 - 1.2 oz/week    1 - 2 Glasses of wine per week     Comment: occasional  . Drug use: No  . Sexual activity: Not on file   Other Topics Concern  . Not on file   Social History Narrative   Pt lives in [place] alone.  Divorced.   Retired from [employer])       Family History  Problem Relation Age of Onset  . Cancer Mother   . Cancer Father   . Other Brother        NO  MEDICAL PROBLEMS  . Other Son        NO MEDICAL PROBLEMS  . Leukemia Daughter 6       Recover and has no other problems     Current medication list and allergy/intolerance information reviewed:    Outpatient Encounter Prescriptions as of 06/03/2017  Medication Sig Note  . atorvastatin (LIPITOR) 10 MG tablet Take 1 tablet (10 mg total) by mouth daily.   [name] dronedarone (MULTAQ) 400 MG tablet Take 1 tablet (400 mg total) by mouth 2 (two) times daily with a meal.   . furosemide (LASIX) 40 MG tablet Take 40 mg by mouth daily as  needed (swelling). Reported on 09/14/2015   . gabapentin (NEURONTIN) 300 MG capsule TAKE 2 CAPSULES(600 MG) BY MOUTH AT BEDTIME   . lisinopril-hydrochlorothiazide (PRINZIDE,ZESTORETIC) 10-12.5 MG tablet Take 1 tablet by mouth daily.   . metoprolol succinate (TOPROL-XL) 100 MG 24 hr tablet Take 1 tablet (100 mg total) by mouth daily.   . Multiple Vitamin tablet Take 1 tablet by mouth daily. 03/14/2015: .  . niacin (NIASPAN) 1000 MG CR tablet TAKE 1 TABLET BY MOUTH EVERY DAY AT BEDTIME   . NON FORMULARY CPAP at night   . XARELTO 20 MG TABS tablet TAKE 1 TABLET BY MOUTH EVERY DAY WITH SUPPER    No facility-administered encounter medications on file as of 06/03/2017.     No Known Allergies     Review of Systems: No headache, visual changes, nausea, vomiting, diarrhea, constipation, dizziness, abdominal pain, skin rash, fevers, chills, night sweats, weight loss, swollen lymph nodes, body aches, joint swelling, muscle aches, chest pain, shortness of breath, mood changes, visual or auditory hallucinations.     Medicare Wellness Questionnaire  Are there smokers in your home (other than you)? no  Depression screen PHQ 2/9 06/03/2017  Decreased Interest 0  Down, Depressed, Hopeless 0  PHQ - 2 Score 0        Activities of Daily Living In your present state of health, do you have any difficulty performing the following activities?:  Driving? no Managing money?  no Feeding yourself? no Getting from bed to chair? no Climbing a flight of stairs? no Preparing food and eating?: no Bathing or showering? no Getting dressed: no Getting to the toilet? no Using the toilet: no Moving around from place to place: no In the past year have you fallen or had a near fall?: no  Hearing Difficulties:  Do you often ask people to speak up or repeat themselves? no Do you experience ringing or noises in your ears? yes  Do you have difficulty understanding soft or whispered voices? no  Memory  Difficulties:  Do you feel that you have a problem with memory? no  Do you often misplace items? no  Do you feel safe at home?  yes  Sexual Health:   Are you sexually active?  Yes  Do you have more than one partner?  No   Risk Factors  Current exercise habits: Walkign  Dietary issues discussed:Yes  Cardiac risk factors: Yes   Exam:  BP 105/61   Pulse 73   Ht 6\' 1"  (1.854 m)   Wt 232 lb (105.2 kg)   BMI 30.61 kg/m  Vision by Snellen chart: right eye:see nurse notes, left eye:see nurse notes  Constitutional: VS see above. General Appearance: alert, well-developed, well-nourished, NAD  Ears, Nose, Mouth, Throat: MMM  Neck: No masses, trachea midline.   Respiratory: Normal respiratory effort. no wheeze, no rhonchi, no  rales  Cardiovascular:No lower extremity edema.   Musculoskeletal: Gait normal. No clubbing/cyanosis of digits.   Neurological: Normal balance/coordination. No tremor. Recalls 3 objects and able to read face of watch with correct time.   Skin: warm, dry, intact. No rash/ulcer.   Psychiatric: Normal judgment/insight. Normal mood and affect. Oriented x3.     Chemistry      Component Value Date/Time   NA 136 05/28/2017 0858   NA 142 04/18/2010   K 4.5 05/28/2017 0858   CL 98 05/28/2017 0858   CO2 30 05/28/2017 0858   BUN 10 05/28/2017 0858   CREATININE 1.23 05/28/2017 0858      Component Value Date/Time   CALCIUM 9.4 05/28/2017 0858   ALKPHOS 79 12/24/2016 0945   AST 22 05/28/2017 0858   ALT 13 05/28/2017 0858   BILITOT 0.9 05/28/2017 0858     Lab Results  Component Value Date   CHOL 108 05/28/2017   HDL 52 05/28/2017   LDLCALC 41 03/18/2016   TRIG 70 05/28/2017   CHOLHDL 2.1 05/28/2017   Lab Results  Component Value Date   IRON 43 (L) 05/28/2017   TIBC 385 05/28/2017   FERRITIN 14 (L) 05/28/2017   Lab Results  Component Value Date   HGBA1C 6.3 (H) 05/28/2017     ASSESSMENT/PLAN:   Encounter for Medicare annual wellness  exam  Low iron: Plan to start oral iron and repeat labs in 3 months. Patient declines further possible GI bleeding workup    chronic back pain: Agree for limited intermittent tramadol. Medication use agreement signed. Opiate risk trouble 0. Patient researched [facility] Controlled Substance Reporting System. Recheck in 3 months.   Chronic medical problems addressed and evaluated today.  Health Maintenance Health Maintenance  Topic Date Due  . OPHTHALMOLOGY EXAM  06/19/2016  . FOOT EXAM  10/16/2016  . PNA vac Low Risk Adult (2 of 2 - PPSV23) 10/16/2016  . INFLUENZA VACCINE  04/01/2017  . HEMOGLOBIN A1C  11/25/2017  . COLONOSCOPY  06/27/2020  . TETANUS/TDAP  07/02/2021  . Hepatitis C Screening  Completed    Immunization History  Administered Date(s) Administered  . Influenza-Unspecified 06/02/2015, 05/30/2016  . Pneumococcal Conjugate-13 10/17/2015  . Pneumococcal Polysaccharide-23 12/09/2000  . Pneumococcal-Unspecified 07/17/2011  . Tdap 07/03/2011  . Varicella 09/16/2015  . Zoster 09/21/2015     During the course of the visit the patient was educated and counseled about appropriate screening and preventive services as noted above.   Patient Instructions (the written plan) was given to the patient.  Medicare Attestation I have personally reviewed: The patient's medical and social history Their use of alcohol, tobacco or illicit drugs Their current medications and supplements The patient's functional ability including ADLs,fall risks, home safety risks, cognitive, and hearing and visual impairment Diet and physical activities Evidence for depression or mood disorders  The patient's weight, height, BMI, and visual acuity have been recorded in the chart.  I have made referrals, counseling, and provided education to the patient based on review of the above and I have provided the patient with a written personalized care plan for preventive services.

## 2017-06-04 MED ORDER — FERRAPLUS 90 90-1 MG PO TABS: 1.0000 | ORAL_TABLET | Freq: Every day | ORAL | 12 refills | Status: DC

## 2017-06-17 ENCOUNTER — Ambulatory Visit (INDEPENDENT_AMBULATORY_CARE_PROVIDER_SITE_OTHER): Payer: Medicare Other | Admitting: Cardiovascular Disease

## 2017-06-17 ENCOUNTER — Encounter: Payer: Self-pay | Admitting: Cardiovascular Disease

## 2017-06-17 VITALS — BP 138/62 | HR 65 | Ht 73.0 in | Wt 225.0 lb

## 2017-06-17 DIAGNOSIS — Z951 Presence of aortocoronary bypass graft: Secondary | ICD-10-CM

## 2017-06-17 DIAGNOSIS — I4510 Unspecified right bundle-branch block: Secondary | ICD-10-CM

## 2017-06-17 DIAGNOSIS — E7800 Pure hypercholesterolemia, unspecified: Secondary | ICD-10-CM

## 2017-06-17 DIAGNOSIS — I2510 Atherosclerotic heart disease of native coronary artery without angina pectoris: Secondary | ICD-10-CM

## 2017-06-17 DIAGNOSIS — I10 Essential (primary) hypertension: Secondary | ICD-10-CM | POA: Diagnosis not present

## 2017-06-17 DIAGNOSIS — I480 Paroxysmal atrial fibrillation: Secondary | ICD-10-CM

## 2017-06-17 DIAGNOSIS — I2583 Coronary atherosclerosis due to lipid rich plaque: Secondary | ICD-10-CM | POA: Diagnosis not present

## 2017-06-17 DIAGNOSIS — I739 Peripheral vascular disease, unspecified: Secondary | ICD-10-CM | POA: Diagnosis not present

## 2017-06-17 NOTE — Assessment & Plan Note (Signed)
History of hyperlipidemia on statin therapy with recent lipid profile performed I/27/18 revealing total cholesterol 108, HDL of 52.

## 2017-06-17 NOTE — Assessment & Plan Note (Signed)
History of hypertension blood pressure measures 130/62. He is on metoprolol. Continue current meds at current dosing

## 2017-06-17 NOTE — Assessment & Plan Note (Signed)
History of CAD status post coronary artery bypass grafting 73/18/98. His last Myoview performed October 2014 showed inferolateral scar without ischemia unchanged from prior studies. He denies chest pain or shortness of breath.

## 2017-06-17 NOTE — Progress Notes (Signed)
06/17/2017 Joedy Eickhoff Beddow   Nov 17, 1945  [id number]  Primary Physician Gregor Hams, MD Primary Cardiologist: Lorretta Harp MD Garret [facility], [place], [place]  HPI:  [name] is a 71 y.o. male moderately overweight, divorced, Caucasian male father of 2, grandfather of 1 grandchild who I saw 06/20/16.  He has a history of CAD status post coronary artery bypass grafting x7 November 16, 1996. His other problems include PVOD status post bilateral iliac artery PTA and stenting by myself remotely with restenting in 2007. He has had right renal artery PTA and stenting as well. His other problems include hypertension, hyperlipidemia, non-insulin-requiring diabetes. He does have paroxysmal atrial fibrillation and has undergone multiple DC cardioversions as well as atrial fibrillation ablations by Dr. Samara Deist at [facility], most recently in May of last year, though he is now back in atrial fibrillation/flutter on a daily basis, which he is aware of but not symptomatic from. He is active and works out on a treadmill every day. He denies chest pain or shortness of breath. His last stress test performed one year ago which revealed an inferolateral scar without ischemia.  Since I saw him at 9 months ago has remained clinically stable. He denies chest pain, shortness of breath or claudication. He works out on a treadmill or plays golf daily. He is a Myoview stress test performed in October 2014 that showed inferolateral scar without ischemia unchanged from prior studies. Recent arterial Doppler studies of his lower extremities revealed ABIs in the 0.9 range bilaterally with patent iliac stents.he's noticed tachycardia over the last several months which on EKG yesterday is suggested A. Fib with RVR. He has been on Coumadin anticoagulation and has been therapeutic. I performed outpatient DC cardioversion on him 03/15/15 successfully back to sinus rhythm. He saw Dr. Rayann Heman subsequent to that and was placed on Multaq  antiarrhythmic therapy. His Coumadin was switched to Xarelto . He remains in sinus rhythm feeling clinically improved. 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    Current Meds  Medication Sig  . atorvastatin (LIPITOR) 10 MG tablet Take 1 tablet (10 mg total) by mouth daily.  [name] dronedarone (MULTAQ) 400 MG tablet Take 1 tablet (400 mg total) by mouth 2 (two) times daily with a meal.  . furosemide (LASIX) 40 MG tablet Take 40 mg by mouth daily as needed (swelling). Reported on 09/14/2015  . gabapentin (NEURONTIN) 300 MG capsule TAKE 2 CAPSULES(600 MG) BY MOUTH AT BEDTIME  . Iron-Folic [id number] (FERRAPLUS 90) 90-1 MG TABS Take 1 tablet by mouth daily.  [name] lisinopril-hydrochlorothiazide (PRINZIDE,ZESTORETIC) 10-12.5 MG tablet Take 1 tablet by mouth daily.  . metoprolol succinate (TOPROL-XL) 100 MG 24 hr tablet Take 1 tablet (100 mg total) by mouth daily.  . Multiple Vitamin tablet Take 1 tablet by mouth daily.  . niacin (NIASPAN) 1000 MG CR tablet TAKE 1 TABLET BY MOUTH EVERY DAY AT BEDTIME  . NON FORMULARY CPAP at night  . traMADol (ULTRAM) 50 MG tablet Take 1 tablet (50 mg total) by mouth every 12 (twelve) hours as needed.  Alveda Reasons 20 MG TABS tablet TAKE 1 TABLET BY MOUTH EVERY DAY WITH SUPPER     No Known Allergies  Social History   Social History  . Marital status: Divorced    Spouse name: N/A  . Number of children: N/A  . Years of education: N/A   Occupational History  . Not on file.   Social History  Main Topics  . Smoking status: Former Smoker    Quit date: 09/01/1994  . Smokeless tobacco: Never Used  . Alcohol use 0.6 - 1.2 oz/week    1 - 2 Glasses of wine per week     Comment: occasional  . Drug use: No  . Sexual activity: Not on file   Other Topics Concern  . Not on file   Social History Narrative   Pt lives in [place] alone.  Divorced.   Retired from [employer])        Review of  Systems: General: negative for chills, fever, night sweats or weight changes.  Cardiovascular: negative for chest pain, dyspnea on exertion, edema, orthopnea, palpitations, paroxysmal nocturnal dyspnea or shortness of breath Dermatological: negative for rash Respiratory: negative for cough or wheezing Urologic: negative for hematuria Abdominal: negative for nausea, vomiting, diarrhea, bright red blood per rectum, melena, or hematemesis Neurologic: negative for visual changes, syncope, or dizziness All other systems reviewed and are otherwise negative except as noted above.    Blood pressure 138/62, pulse 65, height 6\' 1"  (1.854 m), weight 225 lb (102.1 kg).  General appearance: alert and no distress Neck: no adenopathy, no carotid bruit, no JVD, supple, symmetrical, trachea midline and thyroid not enlarged, symmetric, no tenderness/mass/nodules Lungs: clear to auscultation bilaterally Heart: regular rate and rhythm, S1, S2 normal, no murmur, click, rub or gallop Extremities: extremities normal, atraumatic, no cyanosis or edema Pulses: 2+ and symmetric Skin: Skin color, texture, turgor normal. No rashes or lesions Neurologic: Alert and oriented X 3, normal strength and tone. Normal symmetric reflexes. Normal coordination and gait  EKG sinus rhythm with PACs and right bundle branch block. Procedure reviewed this EKG.  ASSESSMENT AND PLAN:   Paroxysmal atrial fibrillation History of paroxysmal atrial fibrillation status post multiple cardioversions as well as A. fib ablation by Dr. Lurene Shadow at [facility]. He remains in sinus rhythm on mulltaq and Xarelto oral anticoagulation.  Status post coronary artery bypass grafting x 7, 11/16/1996 History of CAD status post coronary artery bypass grafting 73/18/98. His last Myoview performed October 2014 showed inferolateral scar without ischemia unchanged from prior studies. He denies chest pain or shortness of  breath.  Peripheral artery disease History of peripheral arterial disease past with bilateral iliac artery PTA and stenting by myself in 2017 as well as right renal artery PTA and stenting as well. His last Doppler studies were performed 3 years ago. He denies claudication. We will recheck lower extremity arterial Doppler studies.  Hyperlipidemia History of hyperlipidemia on statin therapy with recent lipid profile performed I/27/18 revealing total cholesterol 108, HDL of 52.  BP (high blood pressure) History of hypertension blood pressure measures 130/62. He is on metoprolol. Continue current meds at current dosing  Bundle branch block, right Chronic      Lorretta Harp MD [facility], [facility] 06/17/2017 2:13 PM

## 2017-06-17 NOTE — Assessment & Plan Note (Signed)
History of peripheral arterial disease past with bilateral iliac artery PTA and stenting by myself in 2017 as well as right renal artery PTA and stenting as well. His last Doppler studies were performed 3 years ago. He denies claudication. We will recheck lower extremity arterial Doppler studies.

## 2017-06-17 NOTE — Patient Instructions (Signed)
Medication Instructions: Your physician recommends that you continue on your current medications as directed. Please refer to the Current Medication list given to you today.   Testing/Procedures: Your physician has requested that you have a lower extremity arterial duplex. During this test, ultrasound is used to evaluate arterial blood flow in the legs. Allow one hour for this exam. There are no restrictions or special instructions.  Your physician has requested that you have an ankle brachial index (ABI). During this test an ultrasound and blood pressure cuff are used to evaluate the arteries that supply the arms and legs with blood. Allow thirty minutes for this exam. There are no restrictions or special instructions.  Follow-Up: Your physician wants you to follow-up in: 1 year with Dr. Gwenlyn Found. You will receive a reminder letter in the mail two months in advance. If you don't receive a letter, please call our office to schedule the follow-up appointment.  If you need a refill on your cardiac medications before your next appointment, please call your pharmacy.

## 2017-06-17 NOTE — Assessment & Plan Note (Signed)
Chronic. 

## 2017-06-17 NOTE — Assessment & Plan Note (Signed)
History of paroxysmal atrial fibrillation status post multiple cardioversions as well as A. fib ablation by Dr. Lurene Shadow at [facility]. He remains in sinus rhythm on mulltaq and Xarelto oral anticoagulation.

## 2017-06-28 ENCOUNTER — Encounter: Payer: Self-pay | Admitting: Cardiovascular Disease

## 2017-06-29 ENCOUNTER — Telehealth: Payer: Self-pay

## 2017-06-29 NOTE — Telephone Encounter (Signed)
Spoke to patient about email sent to [facility].He stated he has been in atrial flutter since last Thursday 06/25/17,heart rate 95 to 102.Stated he feels ok.Stated he has doppler appointment 07/01/17 he would like to be seen.Appointment scheduled with Almyra Deforest PA 07/01/17 at 11:30 am.Advised to go to ED sooner if needed.

## 2017-06-30 ENCOUNTER — Ambulatory Visit (HOSPITAL_COMMUNITY)
Admission: RE | Admit: 2017-06-30 | Discharge: 2017-06-30 | Disposition: A | Discharge status: Home / Self Care | Payer: Medicare Other | Source: Ambulatory Visit | Attending: Nurse Practitioner | Admitting: Nurse Practitioner

## 2017-06-30 ENCOUNTER — Encounter (HOSPITAL_COMMUNITY): Payer: Self-pay | Admitting: Nurse Practitioner

## 2017-06-30 VITALS — BP 112/66 | HR 100 | Ht 73.0 in | Wt 226.0 lb

## 2017-06-30 DIAGNOSIS — E785 Hyperlipidemia, unspecified: Secondary | ICD-10-CM | POA: Diagnosis not present

## 2017-06-30 DIAGNOSIS — Z9889 Other specified postprocedural states: Secondary | ICD-10-CM | POA: Insufficient documentation

## 2017-06-30 DIAGNOSIS — Z951 Presence of aortocoronary bypass graft: Secondary | ICD-10-CM | POA: Insufficient documentation

## 2017-06-30 DIAGNOSIS — Z806 Family history of leukemia: Secondary | ICD-10-CM | POA: Diagnosis not present

## 2017-06-30 DIAGNOSIS — I701 Atherosclerosis of renal artery: Secondary | ICD-10-CM | POA: Diagnosis not present

## 2017-06-30 DIAGNOSIS — Z87891 Personal history of nicotine dependence: Secondary | ICD-10-CM | POA: Diagnosis not present

## 2017-06-30 DIAGNOSIS — I771 Stricture of artery: Secondary | ICD-10-CM | POA: Diagnosis not present

## 2017-06-30 DIAGNOSIS — I739 Peripheral vascular disease, unspecified: Secondary | ICD-10-CM | POA: Insufficient documentation

## 2017-06-30 DIAGNOSIS — R9431 Abnormal electrocardiogram [ECG] [EKG]: Secondary | ICD-10-CM | POA: Diagnosis not present

## 2017-06-30 DIAGNOSIS — E119 Type 2 diabetes mellitus without complications: Secondary | ICD-10-CM | POA: Insufficient documentation

## 2017-06-30 DIAGNOSIS — I10 Essential (primary) hypertension: Secondary | ICD-10-CM | POA: Diagnosis not present

## 2017-06-30 DIAGNOSIS — Z809 Family history of malignant neoplasm, unspecified: Secondary | ICD-10-CM | POA: Diagnosis not present

## 2017-06-30 DIAGNOSIS — I480 Paroxysmal atrial fibrillation: Secondary | ICD-10-CM | POA: Diagnosis not present

## 2017-06-30 DIAGNOSIS — I4892 Unspecified atrial flutter: Secondary | ICD-10-CM | POA: Diagnosis not present

## 2017-06-30 DIAGNOSIS — Z95820 Peripheral vascular angioplasty status with implants and grafts: Secondary | ICD-10-CM | POA: Diagnosis not present

## 2017-06-30 DIAGNOSIS — I2510 Atherosclerotic heart disease of native coronary artery without angina pectoris: Secondary | ICD-10-CM | POA: Diagnosis not present

## 2017-06-30 DIAGNOSIS — I4891 Unspecified atrial fibrillation: Secondary | ICD-10-CM | POA: Insufficient documentation

## 2017-06-30 DIAGNOSIS — G4733 Obstructive sleep apnea (adult) (pediatric): Secondary | ICD-10-CM | POA: Insufficient documentation

## 2017-06-30 LAB — TSH: TSH: 1.185 u[IU]/mL (ref 0.350–4.500)

## 2017-06-30 LAB — BASIC METABOLIC PANEL
Anion gap: 10 (ref 5–15)
BUN: 11 mg/dL (ref 6–20)
CO2: 30 mmol/L (ref 22–32)
Calcium: 9.9 mg/dL (ref 8.9–10.3)
Chloride: 96 mmol/L — ABNORMAL LOW (ref 101–111)
Creatinine, Ser: 1.54 mg/dL — ABNORMAL HIGH (ref 0.61–1.24)
GFR calc Af Amer: 51 mL/min — ABNORMAL LOW (ref >60)
GFR calc non Af Amer: 44 mL/min — ABNORMAL LOW (ref >60)
Glucose, Bld: 126 mg/dL — ABNORMAL HIGH (ref 65–99)
Potassium: 4.2 mmol/L (ref 3.5–5.1)
Sodium: 136 mmol/L (ref 135–145)

## 2017-06-30 LAB — CBC
HCT: 45.3 % (ref 39.0–52.0)
Hemoglobin: 15.3 g/dL (ref 13.0–17.0)
MCH: 30.7 pg (ref 26.0–34.0)
MCHC: 33.8 g/dL (ref 30.0–36.0)
MCV: 91 fL (ref 78.0–100.0)
Platelets: 181 10*3/uL (ref 150–400)
RBC: 4.98 MIL/uL (ref 4.22–5.81)
RDW: 17.6 % — ABNORMAL HIGH (ref 11.5–15.5)
WBC: 11.1 10*3/uL — ABNORMAL HIGH (ref 4.0–10.5)

## 2017-06-30 NOTE — Progress Notes (Signed)
Primary Care Physician: Gregor Hams, MD Referring Physician: Dr. Gwenlyn Found EP: Dr. Casandra Doffing [name] is a 71 y.o. male with a h/o  PAF, OSA using cpap, HTN, CAD, s/p bypass, s/p ablation x 2, that is here for f/u, on Multaq, as he has noted increased heart rates since last Thursday night. He had been to a bar to meet a friend and had two drinks and the fast rhythm started 2 hours later. He has tried to increase BB but has it  not touched the rate. He is tolerating fairly well, but notices more nervousness. He tracks his HR on his phone app. He saw [name]  last  04/04/15 at which time he was offered repeat ablation,  Multaq, sotalol or tikosyn. He chose multaq and has kept him in rhythm  x 2 years. Otherwise health has been at his baseline. Continues on xarelto without any missed doses.  Today, he denies symptoms of palpitations, chest pain, shortness of breath, orthopnea, PND, lower extremity edema, dizziness, presyncope, syncope, or neurologic sequela. The patient is tolerating medications without difficulties and is otherwise without complaint today.   Past Medical History:  Diagnosis Date  . Atrial fibrillation ([place])   . Coronary artery disease   . DM2 (diabetes mellitus, type 2) (HCC)    diet controlled  . HLD (hyperlipidemia)   . HTN (hypertension)   . OSA (obstructive sleep apnea)    uses CPAP  . Peripheral artery disease ([place])    pseudoaneurysm post afib ablation at [facility] 2011, s/p bilateral iliac stents  . Renal artery stenosis ([facility])    right renal artery PTA and stenting  . S/P coronary artery bypass graft x 7 11/16/96   Past Surgical History:  Procedure Laterality Date  . 2-D echocardiogram  03/25/2010   Normal left ventricular function. Mild MR, TR, trivial AR  . ATRIAL FIBRILLATION ABLATION  03/27/2010, 12/31/2010   [facility], Dr. Nadeen Hogan  . BACK SURGERY  2007  . cardiac stress test  11/21/2009   Exercise capacity 5 METS. No significant ischemia demonstrated  .  CARDIOVERSION N/A 03/15/2015   Procedure: CARDIOVERSION;  Surgeon: Richard Harp, MD;  Location: [facility];  Service: Cardiovascular;  Laterality: N/A;  . CORONARY ARTERY BYPASS GRAFT  1998  . ORCHIECTOMY  1981   for cancer  . TOOTH EXTRACTION  05/27/13   tooth extraction with bone graft    Current Outpatient Prescriptions  Medication Sig Dispense Refill  . atorvastatin (LIPITOR) 10 MG tablet Take 1 tablet (10 mg total) by mouth daily. 90 tablet 3  . cetirizine (ZYRTEC) 10 MG tablet Take 10 mg by mouth daily.    [name] dronedarone (MULTAQ) 400 MG tablet Take 1 tablet (400 mg total) by mouth 2 (two) times daily with a meal. 180 tablet 1  . furosemide (LASIX) 40 MG tablet Take 40 mg by mouth daily as needed (swelling). Reported on 09/14/2015    . gabapentin (NEURONTIN) 300 MG capsule TAKE 2 CAPSULES(600 MG) BY MOUTH AT BEDTIME 180 capsule 0  . Iron-Folic [id number] (FERRAPLUS 90) 90-1 MG TABS Take 1 tablet by mouth daily. 30 tablet 12  . lisinopril-hydrochlorothiazide (PRINZIDE,ZESTORETIC) 10-12.5 MG tablet Take 1 tablet by mouth daily. 90 tablet 0  . metoprolol succinate (TOPROL-XL) 100 MG 24 hr tablet Take 1 tablet (100 mg total) by mouth daily. 90 tablet 3  . Multiple Vitamin tablet Take 1 tablet by mouth daily.    . niacin (NIASPAN) 1000 MG CR tablet TAKE  1 TABLET BY MOUTH EVERY DAY AT BEDTIME 90 tablet 3  . NON FORMULARY CPAP at night    . traMADol (ULTRAM) 50 MG tablet Take 1 tablet (50 mg total) by mouth every 12 (twelve) hours as needed. 30 tablet 2  . XARELTO 20 MG TABS tablet TAKE 1 TABLET BY MOUTH EVERY DAY WITH SUPPER 90 tablet 3   No current facility-administered medications for this encounter.     No Known Allergies  Social History   Social History  . Marital status: Divorced    Spouse name: N/A  . Number of children: N/A  . Years of education: N/A   Occupational History  . Not on file.   Social History Main Topics  . Smoking status: Former Smoker    Quit  date: 09/01/1994  . Smokeless tobacco: Never Used  . Alcohol use 0.6 - 1.2 oz/week    1 - 2 Glasses of wine per week     Comment: occasional  . Drug use: No  . Sexual activity: Not on file   Other Topics Concern  . Not on file   Social History Narrative   Pt lives in [place] alone.  Divorced.   Retired from [employer])       Family History  Problem Relation Age of Onset  . Cancer Mother   . Cancer Father   . Other Brother        NO MEDICAL PROBLEMS  . Other Son        NO MEDICAL PROBLEMS  . Leukemia Daughter 6       Recover and has no other problems    ROS- All systems are reviewed and negative except as per the HPI above  Physical Exam: Vitals:   06/30/17 1525  BP: 112/66  Pulse: 100  Weight: 226 lb (102.5 kg)  Height: 6\' 1"  (1.854 m)   Wt Readings from Last 3 Encounters:  06/30/17 226 lb (102.5 kg)  06/17/17 225 lb (102.1 kg)  06/03/17 232 lb (105.2 kg)    Labs: Lab Results  Component Value Date   NA 136 05/28/2017   K 4.5 05/28/2017   CL 98 05/28/2017   CO2 30 05/28/2017   GLUCOSE 133 (H) 05/28/2017   BUN 10 05/28/2017   CREATININE 1.23 05/28/2017   CALCIUM 9.4 05/28/2017   Lab Results  Component Value Date   INR 2.0 04/04/2015   Lab Results  Component Value Date   CHOL 108 05/28/2017   HDL 52 05/28/2017   LDLCALC 41 03/18/2016   TRIG 70 05/28/2017     GEN- The patient is well appearing, alert and oriented x 3 today.   Head- normocephalic, atraumatic Eyes-  Sclera clear, conjunctiva pink Ears- hearing intact Oropharynx- clear Neck- supple, no JVP Lymph- no cervical lymphadenopathy Lungs- Clear to ausculation bilaterally, normal work of breathing Heart- Regular fast rate and rhythm, no murmurs, rubs or gallops, PMI not laterally displaced GI- soft, NT, ND, + BS Extremities- no clubbing, cyanosis, or edema MS- no significant deformity or atrophy Skin- no rash or lesion Psych- euthymic mood, full affect Neuro-  strength and sensation are intact  EKG- atypical atrial flutter  vrs ectopic atrial rhythm at 100 bpm, RBBB, qtc 510 ms Epic records reviewed    Assessment and Plan: 1. Atrial flutter vrs ectopic atrial rhythm  Pt has done well with multaq and does not want to consider repeat ablation or change in antiarrythmic at this point He is willing to undergo  cardioversion but will have to arrange a ride to/from the hospital He is taking xarelto 20 mg a day for chadsvasc score of at least 4, no missed doses Bmet/mag  He will let me know within the next two weeks when to schedule cardioversion  [name], [facility] [address] [place], [place] [address] [phone]

## 2017-06-30 NOTE — Patient Instructions (Signed)
Call with dates for cardioversion. [phone]

## 2017-07-01 ENCOUNTER — Ambulatory Visit (HOSPITAL_BASED_OUTPATIENT_CLINIC_OR_DEPARTMENT_OTHER)
Admission: RE | Admit: 2017-07-01 | Discharge: 2017-07-01 | Disposition: A | Discharge status: Home / Self Care | Payer: Medicare Other | Source: Ambulatory Visit | Attending: Cardiovascular Disease | Admitting: Cardiovascular Disease

## 2017-07-01 ENCOUNTER — Ambulatory Visit: Payer: Medicare Other | Admitting: Physician Assistant

## 2017-07-01 ENCOUNTER — Ambulatory Visit: Payer: Medicare Other

## 2017-07-01 ENCOUNTER — Ambulatory Visit (HOSPITAL_BASED_OUTPATIENT_CLINIC_OR_DEPARTMENT_OTHER)
Admission: RE | Admit: 2017-07-01 | Discharge: 2017-07-01 | Disposition: A | Discharge status: Home / Self Care | Payer: Medicare Other | Source: Ambulatory Visit | Attending: Cardiology | Admitting: Cardiology

## 2017-07-01 DIAGNOSIS — I739 Peripheral vascular disease, unspecified: Secondary | ICD-10-CM

## 2017-07-01 DIAGNOSIS — I4891 Unspecified atrial fibrillation: Secondary | ICD-10-CM | POA: Diagnosis not present

## 2017-07-02 ENCOUNTER — Other Ambulatory Visit (HOSPITAL_COMMUNITY): Payer: Self-pay | Admitting: *Deleted

## 2017-07-02 ENCOUNTER — Telehealth (HOSPITAL_COMMUNITY): Payer: Self-pay | Admitting: *Deleted

## 2017-07-02 NOTE — Telephone Encounter (Signed)
Patient scheduled for cardioversion on 11/9 at 9am - to arrive at 32am NPO after MN except sip of water with meds. No driving after procedure. No missed doses of Xarelto. Pt verbalized understanding.

## 2017-07-06 ENCOUNTER — Other Ambulatory Visit: Payer: Self-pay | Admitting: *Deleted

## 2017-07-06 MED ORDER — NIACIN ER (ANTIHYPERLIPIDEMIC) 1000 MG PO TBCR: 1000.0000 mg | EXTENDED_RELEASE_TABLET | Freq: Every day | ORAL | 3 refills | Status: DC

## 2017-07-07 ENCOUNTER — Other Ambulatory Visit: Payer: Self-pay | Admitting: Cardiovascular Disease

## 2017-07-07 DIAGNOSIS — I739 Peripheral vascular disease, unspecified: Secondary | ICD-10-CM

## 2017-07-10 ENCOUNTER — Other Ambulatory Visit: Payer: Self-pay

## 2017-07-10 ENCOUNTER — Ambulatory Visit (HOSPITAL_COMMUNITY): Payer: Medicare Other | Admitting: Certified Registered"

## 2017-07-10 ENCOUNTER — Encounter (HOSPITAL_COMMUNITY)
Admission: RE | Disposition: A | Discharge status: Home / Self Care | Payer: Self-pay | Source: Ambulatory Visit | Attending: Internal Medicine

## 2017-07-10 ENCOUNTER — Encounter (HOSPITAL_COMMUNITY): Payer: Self-pay | Admitting: *Deleted

## 2017-07-10 ENCOUNTER — Ambulatory Visit (HOSPITAL_COMMUNITY)
Admission: RE | Admit: 2017-07-10 | Discharge: 2017-07-10 | Disposition: A | Discharge status: Home / Self Care | Payer: Medicare Other | Source: Ambulatory Visit | Attending: Internal Medicine | Admitting: Internal Medicine

## 2017-07-10 DIAGNOSIS — I4891 Unspecified atrial fibrillation: Secondary | ICD-10-CM | POA: Diagnosis not present

## 2017-07-10 DIAGNOSIS — E118 Type 2 diabetes mellitus with unspecified complications: Secondary | ICD-10-CM | POA: Diagnosis not present

## 2017-07-10 DIAGNOSIS — G4730 Sleep apnea, unspecified: Secondary | ICD-10-CM | POA: Diagnosis not present

## 2017-07-10 DIAGNOSIS — I10 Essential (primary) hypertension: Secondary | ICD-10-CM | POA: Insufficient documentation

## 2017-07-10 DIAGNOSIS — I739 Peripheral vascular disease, unspecified: Secondary | ICD-10-CM | POA: Insufficient documentation

## 2017-07-10 DIAGNOSIS — D509 Iron deficiency anemia, unspecified: Secondary | ICD-10-CM | POA: Diagnosis not present

## 2017-07-10 DIAGNOSIS — I491 Atrial premature depolarization: Secondary | ICD-10-CM | POA: Insufficient documentation

## 2017-07-10 DIAGNOSIS — I2510 Atherosclerotic heart disease of native coronary artery without angina pectoris: Secondary | ICD-10-CM | POA: Insufficient documentation

## 2017-07-10 DIAGNOSIS — I480 Paroxysmal atrial fibrillation: Secondary | ICD-10-CM | POA: Diagnosis not present

## 2017-07-10 DIAGNOSIS — N289 Disorder of kidney and ureter, unspecified: Secondary | ICD-10-CM | POA: Diagnosis not present

## 2017-07-10 DIAGNOSIS — I484 Atypical atrial flutter: Secondary | ICD-10-CM | POA: Diagnosis not present

## 2017-07-10 DIAGNOSIS — I4510 Unspecified right bundle-branch block: Secondary | ICD-10-CM | POA: Diagnosis not present

## 2017-07-10 DIAGNOSIS — I4892 Unspecified atrial flutter: Secondary | ICD-10-CM | POA: Insufficient documentation

## 2017-07-10 DIAGNOSIS — Z87891 Personal history of nicotine dependence: Secondary | ICD-10-CM | POA: Insufficient documentation

## 2017-07-10 DIAGNOSIS — E785 Hyperlipidemia, unspecified: Secondary | ICD-10-CM | POA: Diagnosis not present

## 2017-07-10 HISTORY — PX: CARDIOVERSION: SHX1299

## 2017-07-10 LAB — POCT I-STAT 4, (NA,K, GLUC, HGB,HCT)
Glucose, Bld: 135 mg/dL — ABNORMAL HIGH (ref 65–99)
HCT: 45 % (ref 39.0–52.0)
Hemoglobin: 15.3 g/dL (ref 13.0–17.0)
Potassium: 3.7 mmol/L (ref 3.5–5.1)
Sodium: 140 mmol/L (ref 135–145)

## 2017-07-10 SURGERY — CARDIOVERSION
Anesthesia: General

## 2017-07-10 MED ORDER — PROPOFOL 10 MG/ML IV BOLUS
INTRAVENOUS | Status: DC | PRN
  Administered 2017-07-10: 80 mg via INTRAVENOUS

## 2017-07-10 MED ORDER — LIDOCAINE HCL (PF) 2 % IJ SOLN
INTRAMUSCULAR | Status: DC | PRN
  Administered 2017-07-10: 40 mg via INTRADERMAL

## 2017-07-10 MED ORDER — SODIUM CHLORIDE 0.9 % IV SOLN
INTRAVENOUS | Status: DC | PRN
  Administered 2017-07-10: 09:00:00 via INTRAVENOUS

## 2017-07-10 NOTE — CV Procedure (Signed)
   CARDIOVERSION NOTE  Procedure: Electrical Cardioversion Indications:  Atrial Flutter  Procedure Details:  Consent: Risks of procedure as well as the alternatives and risks of each were explained to the (patient/caregiver).  Consent for procedure obtained.  Time Out: Verified patient identification, verified procedure, site/side was marked, verified correct patient position, special equipment/implants available, medications/allergies/relevent history reviewed, required imaging and test results available.  Performed  Patient placed on cardiac monitor, pulse oximetry, supplemental oxygen as necessary.  Sedation given: Propofol per anesthesia Pacer pads placed anterior and posterior chest.  Cardioverted 1 time(s).  Cardioverted at 120J biphasic.  Impression: Findings: Post procedure EKG shows: NSR Complications: None Patient did tolerate procedure well.  Plan: 1. Successful DCCV to NSR with a single 120J biphasic shock.  Time Spent Directly with the Patient:  30 minutes   Pixie Casino, MD, [place]  Attending Cardiologist  Direct Dial: [phone]  Fax: [phone]  Website:  [email or web address] 07/10/2017, 9:08 AM

## 2017-07-10 NOTE — H&P (Signed)
   INTERVAL PROCEDURE H&P  History and Physical Interval Note:  07/10/2017 8:00 AM  [name] has presented today for their planned procedure. The various methods of treatment have been discussed with the patient and family. After consideration of risks, benefits and other options for treatment, the patient has consented to the procedure.  The patients' outpatient history has been reviewed, patient examined, and no change in status from most recent office note within the past 30 days. I have reviewed the patients' chart and labs and will proceed as planned. Questions were answered to the patient's satisfaction.   Pixie Casino, MD, [place]  Attending Cardiologist  Direct Dial: [phone]  Fax: [phone]  Website:  [email or web address]  Nadean Corwin Hilty 07/10/2017, 8:00 AM

## 2017-07-10 NOTE — Transfer of Care (Signed)
Immediate Anesthesia Transfer of Care Note  Patient: [name]  Procedure(s) Performed: CARDIOVERSION (N/A )  Patient Location: Endoscopy Unit  Anesthesia Type:General  Level of Consciousness: awake, oriented and patient cooperative  Airway & Oxygen Therapy: Patient Spontanous Breathing  Post-op Assessment: Report given to RN and Post -op Vital signs reviewed and stable  Post vital signs: Reviewed and stable  Last Vitals:  Vitals:   07/10/17 0750 07/10/17 0846  BP: 117/67 131/75  Pulse:  96  Resp: 10 17  Temp: 37.2 C   SpO2: 93% 93%    Last Pain:  Vitals:   07/10/17 0750  TempSrc: Oral         Complications: No apparent anesthesia complications

## 2017-07-10 NOTE — Anesthesia Postprocedure Evaluation (Signed)
Anesthesia Post Note  Patient: Que Meneely Difiore  Procedure(s) Performed: CARDIOVERSION (N/A )     Patient location during evaluation: PACU Anesthesia Type: General Level of consciousness: awake and alert Pain management: pain level controlled Vital Signs Assessment: post-procedure vital signs reviewed and stable Respiratory status: spontaneous breathing, nonlabored ventilation, respiratory function stable and patient connected to nasal cannula oxygen Cardiovascular status: blood pressure returned to baseline and stable Postop Assessment: no apparent nausea or vomiting Anesthetic complications: no    Last Vitals:  Vitals:   07/10/17 0846 07/10/17 0907  BP: 131/75 (!) 100/55  Pulse: 96 61  Resp: 17 12  Temp:  36.7 C  SpO2: 93% 97%    Last Pain:  Vitals:   07/10/17 0907  TempSrc: Oral                 Effie Berkshire

## 2017-07-10 NOTE — Anesthesia Preprocedure Evaluation (Signed)
Anesthesia Evaluation  Patient identified by MRN, date of birth, ID band Patient awake    Reviewed: Allergy & Precautions, NPO status , Patient's Chart, lab work & pertinent test results  Airway Mallampati: I  TM Distance: >3 FB Neck ROM: Full    Dental  (+) Teeth Intact, Dental Advisory Given   Pulmonary sleep apnea and Continuous Positive Airway Pressure Ventilation , former smoker,    breath sounds clear to auscultation       Cardiovascular hypertension, Pt. on medications and Pt. on home beta blockers + CAD, + CABG and + Peripheral Vascular Disease  + dysrhythmias Atrial Fibrillation  Rhythm:Irregular Rate:Abnormal     Neuro/Psych negative neurological ROS  negative psych ROS   GI/Hepatic negative GI ROS, Neg liver ROS,   Endo/Other  diabetes, Type 2  Renal/GU Renal disease  negative genitourinary   Musculoskeletal   Abdominal   Peds  Hematology   Anesthesia Other Findings   Reproductive/Obstetrics                             Anesthesia Physical Anesthesia Plan  ASA: III  Anesthesia Plan: General   Post-op Pain Management:    Induction: Intravenous  PONV Risk Score and Plan:   Airway Management Planned: Natural Airway and Mask  Additional Equipment:   Intra-op Plan:   Post-operative Plan:   Informed Consent: I have reviewed the patients History and Physical, chart, labs and discussed the procedure including the risks, benefits and alternatives for the proposed anesthesia with the patient or authorized representative who has indicated his/her understanding and acceptance.     Plan Discussed with: CRNA  Anesthesia Plan Comments:         Anesthesia Quick Evaluation

## 2017-07-10 NOTE — Discharge Instructions (Signed)
Monitored Anesthesia Care, Care After These instructions provide you with information about caring for yourself after your procedure. Your health care provider may also give you more specific instructions. Your treatment has been planned according to current medical practices, but problems sometimes occur. Call your health care provider if you have any problems or questions after your procedure. What can I expect after the procedure? After your procedure, it is common to:  Feel sleepy for several hours.  Feel clumsy and have poor balance for several hours.  Feel forgetful about what happened after the procedure.  Have poor judgment for several hours.  Feel nauseous or vomit.  Have a sore throat if you had a breathing tube during the procedure.  Follow these instructions at home: For at least 24 hours after the procedure:   Do not: ? Participate in activities in which you could fall or become injured. ? Drive. ? Use heavy machinery. ? Drink alcohol. ? Take sleeping pills or medicines that cause drowsiness. ? Make important decisions or sign legal documents. ? Take care of children on your own.  Rest. Eating and drinking  Follow the diet that is recommended by your health care provider.  If you vomit, drink water, juice, or soup when you can drink without vomiting.  Make sure you have little or no nausea before eating solid foods. General instructions  Have a responsible adult stay with you until you are awake and alert.  Take over-the-counter and prescription medicines only as told by your health care provider.  If you smoke, do not smoke without supervision.  Keep all follow-up visits as told by your health care provider. This is important. Contact a health care provider if:  You keep feeling nauseous or you keep vomiting.  You feel light-headed.  You develop a rash.  You have a fever. Get help right away if:  You have trouble breathing. This information is  not intended to replace advice given to you by your health care provider. Make sure you discuss any questions you have with your health care provider. Document Released: 12/09/2015 Document Revised: 04/09/2016 Document Reviewed: 12/09/2015 Elsevier Interactive Patient Education  2018 [employer].   Electrical Cardioversion, Care After This sheet gives you information about how to care for yourself after your procedure. Your health care provider may also give you more specific instructions. If you have problems or questions, contact your health care provider. What can I expect after the procedure? After the procedure, it is common to have:  Some redness on the skin where the shocks were given.  Follow these instructions at home:  Do not drive for 24 hours if you were given a medicine to help you relax (sedative).  Take over-the-counter and prescription medicines only as told by your health care provider.  Ask your health care provider how to check your pulse. Check it often.  Rest for 48 hours after the procedure or as told by your health care provider.  Avoid or limit your caffeine use as told by your health care provider. Contact a health care provider if:  You feel like your heart is beating too quickly or your pulse is not regular.  You have a serious muscle cramp that does not go away. Get help right away if:  You have discomfort in your chest.  You are dizzy or you feel faint.  You have trouble breathing or you are short of breath.  Your speech is slurred.  You have trouble moving an arm or  leg on one side of your body.  Your fingers or toes turn cold or blue. This information is not intended to replace advice given to you by your health care provider. Make sure you discuss any questions you have with your health care provider. Document Released: 06/08/2013 Document Revised: 03/21/2016 Document Reviewed: 02/22/2016 Elsevier Interactive Patient Education  [employer].

## 2017-07-10 NOTE — Anesthesia Procedure Notes (Signed)
Procedure Name: MAC Date/Time: 07/10/2017 8:59 AM Performed by: Orlie Dakin, CRNA Pre-anesthesia Checklist: Patient identified, Emergency Drugs available, Suction available, Patient being monitored and Timeout performed Patient Re-evaluated:Patient Re-evaluated prior to induction Oxygen Delivery Method: Ambu bag Preoxygenation: Pre-oxygenation with 100% oxygen Induction Type: IV induction

## 2017-07-13 ENCOUNTER — Encounter (HOSPITAL_COMMUNITY): Payer: Self-pay | Admitting: Internal Medicine

## 2017-07-15 ENCOUNTER — Other Ambulatory Visit: Payer: Self-pay | Admitting: Internal Medicine

## 2017-07-16 NOTE — Telephone Encounter (Signed)
This is a Dr. Gwenlyn Found patient.

## 2017-07-21 ENCOUNTER — Encounter (HOSPITAL_COMMUNITY): Payer: Self-pay | Admitting: Nurse Practitioner

## 2017-07-21 ENCOUNTER — Ambulatory Visit (HOSPITAL_COMMUNITY)
Admission: RE | Admit: 2017-07-21 | Discharge: 2017-07-21 | Disposition: A | Discharge status: Home / Self Care | Payer: Medicare Other | Source: Ambulatory Visit | Attending: Nurse Practitioner | Admitting: Nurse Practitioner

## 2017-07-21 VITALS — BP 112/58 | HR 63 | Ht 73.0 in | Wt 230.0 lb

## 2017-07-21 DIAGNOSIS — Z806 Family history of leukemia: Secondary | ICD-10-CM | POA: Insufficient documentation

## 2017-07-21 DIAGNOSIS — E119 Type 2 diabetes mellitus without complications: Secondary | ICD-10-CM | POA: Diagnosis not present

## 2017-07-21 DIAGNOSIS — Z7901 Long term (current) use of anticoagulants: Secondary | ICD-10-CM | POA: Insufficient documentation

## 2017-07-21 DIAGNOSIS — Z951 Presence of aortocoronary bypass graft: Secondary | ICD-10-CM | POA: Insufficient documentation

## 2017-07-21 DIAGNOSIS — I10 Essential (primary) hypertension: Secondary | ICD-10-CM | POA: Insufficient documentation

## 2017-07-21 DIAGNOSIS — Z809 Family history of malignant neoplasm, unspecified: Secondary | ICD-10-CM | POA: Insufficient documentation

## 2017-07-21 DIAGNOSIS — G4733 Obstructive sleep apnea (adult) (pediatric): Secondary | ICD-10-CM | POA: Insufficient documentation

## 2017-07-21 DIAGNOSIS — Z79899 Other long term (current) drug therapy: Secondary | ICD-10-CM | POA: Diagnosis not present

## 2017-07-21 DIAGNOSIS — Z9079 Acquired absence of other genital organ(s): Secondary | ICD-10-CM | POA: Insufficient documentation

## 2017-07-21 DIAGNOSIS — Z87891 Personal history of nicotine dependence: Secondary | ICD-10-CM | POA: Insufficient documentation

## 2017-07-21 DIAGNOSIS — E785 Hyperlipidemia, unspecified: Secondary | ICD-10-CM | POA: Diagnosis not present

## 2017-07-21 DIAGNOSIS — I480 Paroxysmal atrial fibrillation: Secondary | ICD-10-CM | POA: Diagnosis not present

## 2017-07-21 DIAGNOSIS — I4892 Unspecified atrial flutter: Secondary | ICD-10-CM | POA: Diagnosis not present

## 2017-07-21 DIAGNOSIS — I2510 Atherosclerotic heart disease of native coronary artery without angina pectoris: Secondary | ICD-10-CM | POA: Diagnosis not present

## 2017-07-21 DIAGNOSIS — Z9889 Other specified postprocedural states: Secondary | ICD-10-CM | POA: Diagnosis not present

## 2017-07-21 NOTE — Progress Notes (Signed)
Primary Care Physician: Gregor Hams, MD Referring Physician: Dr. Gwenlyn Found EP: Dr. Casandra Doffing [name] is a 71 y.o. male with a h/o  PAF, OSA using cpap, HTN, CAD, s/p bypass, s/p ablation x 2, that is here for f/u, on Multaq, as he has noted increased heart rates since last Thursday night. He had been to a bar to meet a friend and had two drinks and the fast rhythm started 2 hours later. He has tried to increase BB but has it  not touched the rate. He is tolerating fairly well, but notices more nervousness. He tracks his HR on his phone app. He saw Dr. Rayann Heman  last  04/04/15 at which time he was offered repeat ablation,  Multaq, sotalol or tikosyn. He chose multaq and has kept him in rhythm  x 2 years. Otherwise health has been at his baseline. Continues on xarelto without any missed doses.  F/u 11/20, f/u after cardioversion. He is in SR. He feels well.   Today, he denies symptoms of palpitations, chest pain, shortness of breath, orthopnea, PND, lower extremity edema, dizziness, presyncope, syncope, or neurologic sequela. The patient is tolerating medications without difficulties and is otherwise without complaint today.   Past Medical History:  Diagnosis Date  . Atrial fibrillation ([place])   . Coronary artery disease   . DM2 (diabetes mellitus, type 2) (HCC)    diet controlled  . HLD (hyperlipidemia)   . HTN (hypertension)   . OSA (obstructive sleep apnea)    uses CPAP  . Peripheral artery disease ([place])    pseudoaneurysm post afib ablation at [facility] 2011, s/p bilateral iliac stents  . Renal artery stenosis (HCC)    right renal artery PTA and stenting  . S/P coronary artery bypass graft x 7 11/16/96   Past Surgical History:  Procedure Laterality Date  . 2-D echocardiogram  03/25/2010   Normal left ventricular function. Mild MR, TR, trivial AR  . ATRIAL FIBRILLATION ABLATION  03/27/2010, 12/31/2010   [facility], Dr. Nadeen Landau  . BACK SURGERY  2007  . cardiac stress test  11/21/2009   Exercise capacity 5 METS. No significant ischemia demonstrated  . CARDIOVERSION N/A 03/15/2015   Procedure: CARDIOVERSION;  Surgeon: Lorretta Harp, MD;  Location: [facility] ENDOSCOPY;  Service: Cardiovascular;  Laterality: N/A;  . CARDIOVERSION N/A 07/10/2017   Procedure: CARDIOVERSION;  Surgeon: Pixie Casino, MD;  Location: [place];  Service: Cardiovascular;  Laterality: N/A;  . CORONARY ARTERY BYPASS GRAFT  1998  . ORCHIECTOMY  1981   for cancer  . TOOTH EXTRACTION  05/27/13   tooth extraction with bone graft    Current Outpatient Medications  Medication Sig Dispense Refill  . acetaminophen (TYLENOL) 500 MG tablet Take 1,000 mg every 6 (six) hours as needed by mouth (for pain.).    [name] atorvastatin (LIPITOR) 10 MG tablet TAKE 1 TABLET(10 MG) BY MOUTH DAILY 90 tablet 3  . cetirizine (ZYRTEC) 10 MG tablet Take 10 mg by mouth daily.    [name] dronedarone (MULTAQ) 400 MG tablet Take 1 tablet (400 mg total) by mouth 2 (two) times daily with a meal. 180 tablet 1  . gabapentin (NEURONTIN) 300 MG capsule TAKE 2 CAPSULES(600 MG) BY MOUTH AT BEDTIME 180 capsule 0  . Iron-Folic [id number] (FERRAPLUS 90) 90-1 MG TABS Take 1 tablet by mouth daily. (Patient taking differently: Take 1 tablet daily at 3 pm by mouth. ) 30 tablet 12  . lisinopril-hydrochlorothiazide (PRINZIDE,ZESTORETIC) 10-12.5 MG tablet Take 1 tablet  by mouth daily. (Patient taking differently: Take 1 tablet every evening by mouth. ) 90 tablet 0  . metoprolol succinate (TOPROL-XL) 100 MG 24 hr tablet TAKE 1 TABLET(100 MG) BY MOUTH DAILY 90 tablet 3  . Multiple Vitamin (MULTIVITAMIN WITH MINERALS) TABS tablet Take 1 tablet daily by mouth.    . niacin (NIASPAN) 1000 MG CR tablet Take 1 tablet (1,000 mg total) at bedtime by mouth. 90 tablet 3  . NON FORMULARY CPAP at night    . traMADol (ULTRAM) 50 MG tablet Take 1 tablet (50 mg total) by mouth every 12 (twelve) hours as needed. 30 tablet 2  . XARELTO 20 MG TABS tablet TAKE 1 TABLET BY  MOUTH EVERY DAY WITH SUPPER 90 tablet 3  . furosemide (LASIX) 40 MG tablet Take 40 mg by mouth daily as needed (swelling). Reported on 09/14/2015     No current facility-administered medications for this encounter.     No Known Allergies  Social History   Socioeconomic History  . Marital status: Divorced    Spouse name: Not on file  . Number of children: Not on file  . Years of education: Not on file  . Highest education level: Not on file  Social Needs  . Financial resource strain: Not on file  . Food insecurity - worry: Not on file  . Food insecurity - inability: Not on file  . Transportation needs - medical: Not on file  . Transportation needs - non-medical: Not on file  Occupational History  . Not on file  Tobacco Use  . Smoking status: Former Smoker    Last attempt to quit: 09/01/1994    Years since quitting: 22.9  . Smokeless tobacco: Never Used  Substance and Sexual Activity  . Alcohol use: Yes    Alcohol/week: 0.6 - 1.2 oz    Types: 1 - 2 Glasses of wine per week    Comment: occasional  . Drug use: No  . Sexual activity: Not on file  Other Topics Concern  . Not on file  Social History Narrative   Pt lives in [place] alone.  Divorced.   Retired from [employer])    Family History  Problem Relation Age of Onset  . Cancer Mother   . Cancer Father   . Other Brother        NO MEDICAL PROBLEMS  . Other Son        NO MEDICAL PROBLEMS  . Leukemia Daughter 6       Recover and has no other problems    ROS- All systems are reviewed and negative except as per the HPI above  Physical Exam: Vitals:   07/21/17 1100  BP: (!) 112/58  Pulse: 63  Weight: 230 lb (104.3 kg)  Height: 6\' 1"  (1.854 m)   Wt Readings from Last 3 Encounters:  07/21/17 230 lb (104.3 kg)  07/10/17 224 lb (101.6 kg)  06/30/17 226 lb (102.5 kg)    Labs: Lab Results  Component Value Date   NA 140 07/10/2017   K 3.7 07/10/2017   CL 96 (L) 06/30/2017   CO2 30  06/30/2017   GLUCOSE 135 (H) 07/10/2017   BUN 11 06/30/2017   CREATININE 1.54 (H) 06/30/2017   CALCIUM 9.9 06/30/2017   Lab Results  Component Value Date   INR 2.0 04/04/2015   Lab Results  Component Value Date   CHOL 108 05/28/2017   HDL 52 05/28/2017   LDLCALC 41 03/18/2016   TRIG 70  05/28/2017     GEN- The patient is well appearing, alert and oriented x 3 today.   Head- normocephalic, atraumatic Eyes-  Sclera clear, conjunctiva pink Ears- hearing intact Oropharynx- clear Neck- supple, no JVP Lymph- no cervical lymphadenopathy Lungs- Clear to ausculation bilaterally, normal work of breathing Heart- Regular fast rate and rhythm, no murmurs, rubs or gallops, PMI not laterally displaced GI- soft, NT, ND, + BS Extremities- no clubbing, cyanosis, or edema MS- no significant deformity or atrophy Skin- no rash or lesion Psych- euthymic mood, full affect Neuro- strength and sensation are intact  EKG- NSR at 63 bpm, pr int 132 ms, qrs int 138 ms, qtc 503 ms ( RBBB is contributing to length of qtc) Epic records reviewed    Assessment and Plan: 1. Atrial flutter vrs ectopic atrial rhythm  Successful cardioversion, in SR today Pt has done well with multaq and does not want to consider repeat ablation or change in antiarrythmic at this point He is taking xarelto 20 mg a day for chadsvasc score of at least 4, no missed doses   F/u with afib clinic in 6 months Dr. Gwenlyn Found as scheduled  [name]. [name], [facility] [address] [place], [place] [address] [phone]

## 2017-07-29 NOTE — Addendum Note (Signed)
Encounter addended by: Sherran Needs, NP on: 07/29/2017 12:57 PM  Actions taken: LOS modified

## 2017-08-03 ENCOUNTER — Other Ambulatory Visit: Payer: Self-pay | Admitting: Family Medicine

## 2017-08-03 ENCOUNTER — Other Ambulatory Visit: Payer: Self-pay | Admitting: Nurse Practitioner

## 2017-08-03 DIAGNOSIS — I480 Paroxysmal atrial fibrillation: Secondary | ICD-10-CM

## 2017-08-14 ENCOUNTER — Encounter: Payer: Self-pay | Admitting: Cardiovascular Disease

## 2017-08-20 ENCOUNTER — Ambulatory Visit: Payer: Medicare Other | Admitting: Cardiovascular Disease

## 2017-08-20 ENCOUNTER — Encounter: Payer: Self-pay | Admitting: Cardiovascular Disease

## 2017-08-20 VITALS — BP 122/70 | HR 58 | Ht 73.0 in | Wt 229.0 lb

## 2017-08-20 DIAGNOSIS — I739 Peripheral vascular disease, unspecified: Secondary | ICD-10-CM

## 2017-08-20 DIAGNOSIS — E782 Mixed hyperlipidemia: Secondary | ICD-10-CM | POA: Diagnosis not present

## 2017-08-20 DIAGNOSIS — I480 Paroxysmal atrial fibrillation: Secondary | ICD-10-CM

## 2017-08-20 DIAGNOSIS — Z794 Long term (current) use of insulin: Secondary | ICD-10-CM

## 2017-08-20 DIAGNOSIS — G4733 Obstructive sleep apnea (adult) (pediatric): Secondary | ICD-10-CM | POA: Diagnosis not present

## 2017-08-20 DIAGNOSIS — I701 Atherosclerosis of renal artery: Secondary | ICD-10-CM | POA: Diagnosis not present

## 2017-08-20 DIAGNOSIS — Z951 Presence of aortocoronary bypass graft: Secondary | ICD-10-CM

## 2017-08-20 DIAGNOSIS — I10 Essential (primary) hypertension: Secondary | ICD-10-CM

## 2017-08-20 DIAGNOSIS — E118 Type 2 diabetes mellitus with unspecified complications: Secondary | ICD-10-CM

## 2017-08-20 NOTE — Progress Notes (Signed)
Cardiology Office Note    Date:  08/22/2017   ID:  [name], DOB 1946-06-14, MRN [id number]  PCP:  Gregor Hams, MD  Cardiologist:  Shelva Majestic, MD   Sleep Clinic evaluation  History of Present Illness:  [name] is a 71 y.o. male who presents for sleep clinic in need for new CPAP machine.  [name] has a history of coronary artery disease and is status post CBG revascularization surgery 7 in March 1998.  He also has a history of PVD and is status post bilateral iliac artery PTA and stenting, right renal artery PTA and stenting and also has a history of hypertension, hyperlipidemia, diabetes mellitus, and paroxysmal atrial fibrillation.   He has a history of obstructive sleep apnea that was first diagnosed in 2009.  A diagnostic sleep study done at the [facility] on 03/22/2008 showed an AHI of 13.4 per hour.  However, during REM sleep  AHI was 27.8 per hour.  There was a positional component with supine sleep AHI at 38.7 versus nonsupine sleep at 9.9 per hour.  He had significant oxygen desaturation to 84%.  Over the last 9 years he has been using CPAP with 100% compliance.  He has an old Respironics REM Star Auto CPAP  machine.  This past week his machine  Malfunctioned and he is need for new machine.  I was able to interrogate machine that he brought with him to the office today.  Over the past 30 days, his 30 day sleep averages 9 hours and 12 minutes.  AHI is 0.8.  He set at 14 cm water pressure.  He has been using a chronotropic fullface mask.  He typically goes to bed between 10 or 11 PM and wakes up around 7:30 AM.  He has been buying his supplies online.  He cannot sleep without his CPAP machine.  He has a special adapter for portable machine since in the past when he had a power outage he could not sleep at all without therapy.  He had recently undergone cardioversion for atrial fibrillation and was recently seen in the atrial fibrillation clinic  and was in sinus rhythm.  An Epworth Sleepiness Scale score was calculated in the office today and this endorsed at 3.  He presents for evaluation   Past Medical History:  Diagnosis Date  . Atrial fibrillation ([place])   . Coronary artery disease   . DM2 (diabetes mellitus, type 2) (HCC)    diet controlled  . HLD (hyperlipidemia)   . HTN (hypertension)   . OSA (obstructive sleep apnea)    uses CPAP  . Peripheral artery disease ([place])    pseudoaneurysm post afib ablation at [facility] 2011, s/p bilateral iliac stents  . Renal artery stenosis (HCC)    right renal artery PTA and stenting  . S/P coronary artery bypass graft x 7 11/16/96    Past Surgical History:  Procedure Laterality Date  . 2-D echocardiogram  03/25/2010   Normal left ventricular function. Mild MR, TR, trivial AR  . ATRIAL FIBRILLATION ABLATION  03/27/2010, 12/31/2010   [facility], Dr. Nadeen Landau  . BACK SURGERY  2007  . cardiac stress test  11/21/2009   Exercise capacity 5 METS. No significant ischemia demonstrated  . CARDIOVERSION N/A 03/15/2015   Procedure: CARDIOVERSION;  Surgeon: Lorretta Harp, MD;  Location: [facility] ENDOSCOPY;  Service: Cardiovascular;  Laterality: N/A;  . CARDIOVERSION N/A 07/10/2017   Procedure: CARDIOVERSION;  Surgeon: Pixie Casino,  MD;  Location: [place];  Service: Cardiovascular;  Laterality: N/A;  . CORONARY ARTERY BYPASS GRAFT  1998  . ORCHIECTOMY  1981   for cancer  . TOOTH EXTRACTION  05/27/13   tooth extraction with bone graft    Current Medications: Outpatient Medications Prior to Visit  Medication Sig Dispense Refill  . acetaminophen (TYLENOL) 500 MG tablet Take 1,000 mg every 6 (six) hours as needed by mouth (for pain.).    [name] atorvastatin (LIPITOR) 10 MG tablet TAKE 1 TABLET(10 MG) BY MOUTH DAILY 90 tablet 3  . cetirizine (ZYRTEC) 10 MG tablet Take 10 mg by mouth daily.    . furosemide (LASIX) 40 MG tablet Take 40 mg by mouth daily as needed (swelling). Reported on 09/14/2015    .  gabapentin (NEURONTIN) 300 MG capsule TAKE 2 CAPSULES(600 MG) BY MOUTH AT BEDTIME 180 capsule 0  . Iron-Folic [id number] (FERRAPLUS 90) 90-1 MG TABS Take 1 tablet by mouth daily. (Patient taking differently: Take 1 tablet daily at 3 pm by mouth. ) 30 tablet 12  . lisinopril-hydrochlorothiazide (PRINZIDE,ZESTORETIC) 10-12.5 MG tablet Take 1 tablet by mouth daily. (Patient taking differently: Take 1 tablet every evening by mouth. ) 90 tablet 0  . metoprolol succinate (TOPROL-XL) 100 MG 24 hr tablet TAKE 1 TABLET(100 MG) BY MOUTH DAILY 90 tablet 3  . MULTAQ 400 MG tablet TAKE 1 TABLET BY MOUTH TWICE DAILY WITH MEAL 180 tablet 2  . Multiple Vitamin (MULTIVITAMIN WITH MINERALS) TABS tablet Take 1 tablet daily by mouth.    . niacin (NIASPAN) 1000 MG CR tablet Take 1 tablet (1,000 mg total) at bedtime by mouth. 90 tablet 3  . NON FORMULARY CPAP at night    . traMADol (ULTRAM) 50 MG tablet Take 1 tablet (50 mg total) by mouth every 12 (twelve) hours as needed. 30 tablet 2  . XARELTO 20 MG TABS tablet TAKE 1 TABLET BY MOUTH EVERY DAY WITH SUPPER 90 tablet 3   No facility-administered medications prior to visit.      Allergies:   Patient has no known allergies.   Social History   Socioeconomic History  . Marital status: Divorced    Spouse name: None  . Number of children: None  . Years of education: None  . Highest education level: None  Social Needs  . Financial resource strain: None  . Food insecurity - worry: None  . Food insecurity - inability: None  . Transportation needs - medical: None  . Transportation needs - non-medical: None  Occupational History  . None  Tobacco Use  . Smoking status: Former Smoker    Last attempt to quit: 09/01/1994    Years since quitting: 22.9  . Smokeless tobacco: Never Used  Substance and Sexual Activity  . Alcohol use: Yes    Alcohol/week: 0.6 - 1.2 oz    Types: 1 - 2 Glasses of wine per week    Comment: occasional  . Drug use: No  . Sexual  activity: None  Other Topics Concern  . None  Social History Narrative   Pt lives in [place] alone.  Divorced.   Retired from [employer])     Family History:  The patient's family history includes Cancer in his father and mother; Leukemia (age of onset: 50) in his daughter; Other in his brother and son.   ROS General: Negative; No fevers, chills, or night sweats;  HEENT: Negative; No changes in vision or hearing, sinus congestion, difficulty swallowing Pulmonary: Negative; No  cough, wheezing, shortness of breath, hemoptysis Cardiovascular: History of PAF, CABG revascularization surgery in PVD GI: Negative; No nausea, vomiting, diarrhea, or abdominal pain GU: Negative; No dysuria, hematuria, or difficulty voiding Musculoskeletal: Negative; no myalgias, joint pain, or weakness Hematologic/Oncology: Negative; no easy bruising, bleeding Endocrine: Negative; no heat/cold intolerance; no diabetes Neuro: Negative; no changes in balance, headaches Skin: Negative; No rashes or skin lesions Psychiatric: Negative; No behavioral problems, depression Sleep: Positive for obstructive sleep apnea on CPAP therapy since 2009.  With treatment there is no snoring, daytime sleepiness, hypersomnolence, bruxism, restless legs, hypnogognic hallucinations, no cataplexy Other comprehensive 14 point system review is negative.   PHYSICAL EXAM:   VS:  BP 122/70   Pulse (!) 58   Ht '6\' 1"'  (1.854 m)   Wt 229 lb (103.9 kg)   BMI 30.21 kg/m    Wt Readings from Last 3 Encounters:  08/20/17 229 lb (103.9 kg)  07/21/17 230 lb (104.3 kg)  07/10/17 224 lb (101.6 kg)    General: Alert, oriented, no distress.  Skin: normal turgor, no rashes, warm and dry HEENT: Normocephalic, atraumatic. Pupils equal round and reactive to light; sclera anicteric; extraocular muscles intact; Fundi no hemorrhages or exudates. Nose without nasal septal hypertrophy Mouth/Parynx benign; Mallinpatti scale 3 Neck:  No JVD, no carotid bruits; normal carotid upstroke Lungs: clear to ausculatation and percussion; no wheezing or rales Chest wall: without tenderness to palpitation Heart: PMI not displaced, RRR, s1 s2 normal, 1/6 systolic murmur, no diastolic murmur, no rubs, gallops, thrills, or heaves Abdomen: Diastases recti; soft, nontender; no hepatosplenomehaly, BS+; abdominal aorta nontender and not dilated by palpation. Back: no CVA tenderness Pulses 2+ Musculoskeletal: full range of motion, normal strength, no joint deformities Extremities: no clubbing cyanosis or edema, Homan's sign negative  Neurologic: grossly nonfocal; Cranial nerves grossly wnl Psychologic: Normal mood and affect   Studies/Labs Reviewed:   EKG:  EKG is ordered today.  ECG (independently read by me):  Sinus bradycardia with sinu arrhythmia.  Right bunde branch block.  Recent Labs: BMP Latest Ref Rng & Units 07/10/2017 06/30/2017 05/28/2017  Glucose 65 - 99 mg/dL 135(H) 126(H) 133(H)  BUN 6 - 20 mg/dL - 11 10  Creatinine 0.61 - 1.24 mg/dL - 1.54(H) 1.23  BUN/Creat Ratio 6 - 22 (calc) - - NOT APPLICABLE  Sodium 749 - 145 mmol/L 140 136 136  Potassium 3.5 - 5.1 mmol/L 3.7 4.2 4.5  Chloride 101 - 111 mmol/L - 96(L) 98  CO2 22 - 32 mmol/L - 30 30  Calcium 8.9 - 10.3 mg/dL - 9.9 9.4     Hepatic Function Latest Ref Rng & Units 05/28/2017 12/24/2016 03/18/2016  Total Protein 6.1 - 8.1 g/dL 7.1 7.3 7.2  Albumin 3.5 - 5.0 g/dL - 4.0 4.1  AST 10 - 35 U/L 22 35 17  ALT 9 - 46 U/L 13 16(L) 11  Alk Phosphatase 38 - 126 U/L - 79 71  Total Bilirubin 0.2 - 1.2 mg/dL 0.9 1.4(H) 1.0  Bilirubin, Direct <=0.2 mg/dL - - -    CBC Latest Ref Rng & Units 07/10/2017 06/30/2017 05/28/2017  WBC 4.0 - 10.5 K/uL - 11.1(H) 6.8  Hemoglobin 13.0 - 17.0 g/dL 15.3 15.3 13.5  Hematocrit 39.0 - 52.0 % 45.0 45.3 39.8  Platelets 150 - 400 K/uL - 181 207   Lab Results  Component Value Date   MCV 91.0 06/30/2017   MCV 86.5 05/28/2017   MCV 91.5  12/24/2016   Lab Results  Component Value Date  TSH 1.185 06/30/2017   Lab Results  Component Value Date   HGBA1C 6.3 (H) 05/28/2017     BNP No results found for: BNP  ProBNP No results found for: PROBNP   Lipid Panel     Component Value Date/Time   CHOL 108 05/28/2017 0858   TRIG 70 05/28/2017 0858   HDL 52 05/28/2017 0858   CHOLHDL 2.1 05/28/2017 0858   VLDL 14 03/18/2016 0822   LDLCALC 41 03/18/2016 0822     RADIOLOGY: No results found.   Additional studies/ records that were reviewed today include:  I reviewed both his sleep study and CPAP titration study from 2009.  I reviewed office records.    ASSESSMENT:    1. OSA (obstructive sleep apnea)   2. Paroxysmal atrial fibrillation (HCC)   3. Status post coronary artery bypass grafting x 7, 11/16/1996   4. Essential hypertension   5. Peripheral artery disease ([place])   6. Left renal artery stenosis (HCC)   7. Mixed hyperlipidemia   8. Type 2 diabetes mellitus with complication, with long-term current use of insulin [facility])      PLAN:  [name] is a woman who has significant cardiovascular comorbidities including CAD, status post CABG revascularization surgery, PVD, status post bilateral iliac artery stenting and right renal artery stenting, as well as a history of hypertension, hyperlipidemia, and diabetes mellitus.  He recently had an episode of atrial fibrillation, and remotely had undergone multiple cardioversions in the past.  He is been diagnosed as having sleep apnea since 2009.  Currently, his CPAP is set at 14 cm water pressure.  His machine is 69 years old.  This week it malfunctioned and he is in need for replacement.  He has been using a full face mask.  I have written a prescription for a ResMed air since 10 auto CPAP unit.  He will also be transitioning to [facility] for his DME company.  His BMI is 30.21.  We discussed weight reduction if possible.  He has been getting his supplies  online due to reduce cost.  I will need to see him back in a sleep clinic following his new machine within 90 days per Medicare requirements.  Presently, his blood pressure is well controlled at 122/70.  On his medical regimen consisting of lisinopril HCT 10/12.5, Toprol-XL 100 mg and furosemide as needed.  He has a history of mixed hyperlipidemia and is on atorvastatin and niacin.  He continues to be on multaq with his history of PAF.  He is anticoagulated on Xarelto.  I will see him back in the office in 3-4 months for reevaluation   Medication Adjustments/Labs and Tests Ordered: Current medicines are reviewed at length with the patient today.  Concerns regarding medicines are outlined above.  Medication changes, Labs and Tests ordered today are listed in the Patient Instructions below. Patient Instructions  Dr Claiborne Billings recommends that you schedule a follow-up appointment in 4 months.  If you need a refill on your cardiac medications before your next appointment, please call your pharmacy.    Signed, Shelva Majestic, MD  08/22/2017 6:09 PM    [facility] [address], [place], [place], [place]  [address] Phone: [phone]

## 2017-08-20 NOTE — Patient Instructions (Signed)
Dr Claiborne Billings recommends that you schedule a follow-up appointment in 4 months.  If you need a refill on your cardiac medications before your next appointment, please call your pharmacy.

## 2017-08-22 ENCOUNTER — Encounter: Payer: Self-pay | Admitting: Cardiovascular Disease

## 2017-08-23 ENCOUNTER — Other Ambulatory Visit: Payer: Self-pay | Admitting: Cardiovascular Disease

## 2017-09-07 DIAGNOSIS — H5213 Myopia, bilateral: Secondary | ICD-10-CM | POA: Diagnosis not present

## 2017-09-07 LAB — HM DIABETES EYE EXAM

## 2017-09-09 DIAGNOSIS — R0602 Shortness of breath: Secondary | ICD-10-CM | POA: Diagnosis not present

## 2017-09-09 DIAGNOSIS — G4733 Obstructive sleep apnea (adult) (pediatric): Secondary | ICD-10-CM | POA: Diagnosis not present

## 2017-09-09 DIAGNOSIS — I482 Chronic atrial fibrillation: Secondary | ICD-10-CM | POA: Diagnosis not present

## 2017-09-09 DIAGNOSIS — I10 Essential (primary) hypertension: Secondary | ICD-10-CM | POA: Diagnosis not present

## 2017-09-10 ENCOUNTER — Ambulatory Visit: Payer: Medicare Other | Admitting: Family Medicine

## 2017-09-11 ENCOUNTER — Encounter: Payer: Self-pay | Admitting: Family Medicine

## 2017-09-15 DIAGNOSIS — D3709 Neoplasm of uncertain behavior of other specified sites of the oral cavity: Secondary | ICD-10-CM | POA: Diagnosis not present

## 2017-09-23 DIAGNOSIS — K1329 Other disturbances of oral epithelium, including tongue: Secondary | ICD-10-CM | POA: Diagnosis not present

## 2017-10-10 DIAGNOSIS — G4733 Obstructive sleep apnea (adult) (pediatric): Secondary | ICD-10-CM | POA: Diagnosis not present

## 2017-11-01 ENCOUNTER — Other Ambulatory Visit: Payer: Self-pay | Admitting: Family Medicine

## 2017-11-05 DIAGNOSIS — L720 Epidermal cyst: Secondary | ICD-10-CM | POA: Diagnosis not present

## 2017-11-05 DIAGNOSIS — L723 Sebaceous cyst: Secondary | ICD-10-CM | POA: Diagnosis not present

## 2017-11-07 DIAGNOSIS — G4733 Obstructive sleep apnea (adult) (pediatric): Secondary | ICD-10-CM | POA: Diagnosis not present

## 2017-12-07 ENCOUNTER — Ambulatory Visit: Payer: Medicare Other | Admitting: Cardiovascular Disease

## 2017-12-07 ENCOUNTER — Encounter: Payer: Self-pay | Admitting: Cardiovascular Disease

## 2017-12-07 VITALS — BP 132/58 | HR 65 | Ht 73.0 in | Wt 223.6 lb

## 2017-12-07 DIAGNOSIS — E7800 Pure hypercholesterolemia, unspecified: Secondary | ICD-10-CM

## 2017-12-07 DIAGNOSIS — I10 Essential (primary) hypertension: Secondary | ICD-10-CM | POA: Diagnosis not present

## 2017-12-07 DIAGNOSIS — Z951 Presence of aortocoronary bypass graft: Secondary | ICD-10-CM

## 2017-12-07 DIAGNOSIS — I480 Paroxysmal atrial fibrillation: Secondary | ICD-10-CM | POA: Diagnosis not present

## 2017-12-07 DIAGNOSIS — G4733 Obstructive sleep apnea (adult) (pediatric): Secondary | ICD-10-CM

## 2017-12-07 DIAGNOSIS — I739 Peripheral vascular disease, unspecified: Secondary | ICD-10-CM | POA: Diagnosis not present

## 2017-12-07 NOTE — Progress Notes (Signed)
Cardiology Office Note    Date:  12/09/2017   ID:  [name], DOB 07/27/1946, MRN [id number]  PCP:  Gregor Hams, MD  Cardiologist:  Shelva Majestic, MD   Sleep Clinic evaluation  History of Present Illness:  [name] is a 72 y.o. male who presents for a follow-up sleep evaluation after receiving a new CPAP machine on September 09, 2017.  [name] has a history of coronary artery disease and is status post CBG revascularization surgery 7 in March 1998.  He also has a history of PVD and is status post bilateral iliac artery PTA and stenting, right renal artery PTA and stenting and also has a history of hypertension, hyperlipidemia, diabetes mellitus, and paroxysmal atrial fibrillation.   He has a history of obstructive sleep apnea that was first diagnosed in 2009.  A diagnostic sleep study done at the [facility] on 03/22/2008 showed an AHI of 13.4 per hour.  However, during REM sleep  AHI was 27.8 per hour.  There was a positional component with supine sleep AHI at 38.7 versus nonsupine sleep at 9.9 per hour.  He had significant oxygen desaturation to 84%.  Over the last 9 years he has been using CPAP with 100% compliance.  He has an old Respironics REM Star Auto CPAP  machine.  This past week his machine  Malfunctioned and he is need for new machine.  I was able to interrogate machine that he brought with him to the office today.  Over the past 30 days, his 30 day sleep averages 9 hours and 12 minutes.  AHI is 0.8.  He set at 14 cm water pressure.  He has been using a chronotropic fullface mask.  He typically goes to bed between 10 or 11 PM and wakes up around 7:30 AM.  He has been buying his supplies online.  He cannot sleep without his CPAP machine.  He has a special adapter for portable machine since in the past when he had a power outage he could not sleep at all without therapy.  He had recently undergone cardioversion for atrial fibrillation and was  recently seen in the atrial fibrillation clinic and was in sinus rhythm.  An Epworth Sleepiness Scale score was calculated in the office when I last saw him in December 2018 and this endorsed at 3.  At that time, his machine was over 73 years old and he was in need for new machine.  He has transitioned to [facility] as his DME company and received a new ResMed AirSense 10 AutoSet CPAP machine on 09/09/2017.  I obtained a new download from March 9 through 12/06/2017.  He is 100% compliant and is averaging 8 hours and 37 minutes of sleep per night.  At a 14 cm water pressure.  AHI is excellent at 2.4.  He uses a fullface mask.  There is no leak.  He notices a dramatic improvement with his new machine.  Compared to his old machine.  He is sleeping well.  He denies residual snoring.  He denies daytime sleepiness.  Past Medical History:  Diagnosis Date  . Atrial fibrillation ([place])   . Coronary artery disease   . DM2 (diabetes mellitus, type 2) (HCC)    diet controlled  . HLD (hyperlipidemia)   . HTN (hypertension)   . OSA (obstructive sleep apnea)    uses CPAP  . Peripheral artery disease ([place])    pseudoaneurysm post afib ablation at  [facility] 2011, s/p bilateral iliac stents  . Renal artery stenosis (HCC)    right renal artery PTA and stenting  . S/P coronary artery bypass graft x 7 11/16/96    Past Surgical History:  Procedure Laterality Date  . 2-D echocardiogram  03/25/2010   Normal left ventricular function. Mild MR, TR, trivial AR  . ATRIAL FIBRILLATION ABLATION  03/27/2010, 12/31/2010   [facility], Dr. Nadeen Landau  . BACK SURGERY  2007  . cardiac stress test  11/21/2009   Exercise capacity 5 METS. No significant ischemia demonstrated  . CARDIOVERSION N/A 03/15/2015   Procedure: CARDIOVERSION;  Surgeon: Lorretta Harp, MD;  Location: [facility] ENDOSCOPY;  Service: Cardiovascular;  Laterality: N/A;  . CARDIOVERSION N/A 07/10/2017   Procedure: CARDIOVERSION;  Surgeon: Pixie Casino, MD;  Location:  [place];  Service: Cardiovascular;  Laterality: N/A;  . CORONARY ARTERY BYPASS GRAFT  1998  . ORCHIECTOMY  1981   for cancer  . TOOTH EXTRACTION  05/27/13   tooth extraction with bone graft    Current Medications: Outpatient Medications Prior to Visit  Medication Sig Dispense Refill  . acetaminophen (TYLENOL) 500 MG tablet Take 1,000 mg every 6 (six) hours as needed by mouth (for pain.).    [name] atorvastatin (LIPITOR) 10 MG tablet TAKE 1 TABLET(10 MG) BY MOUTH DAILY 90 tablet 3  . cetirizine (ZYRTEC) 10 MG tablet Take 10 mg by mouth daily.    . furosemide (LASIX) 40 MG tablet Take 40 mg by mouth daily as needed (swelling). Reported on 09/14/2015    . gabapentin (NEURONTIN) 300 MG capsule TAKE 2 CAPSULES(600 MG) BY MOUTH AT BEDTIME 180 capsule 0  . Iron-Folic [id number] (FERRAPLUS 90) 90-1 MG TABS Take 1 tablet by mouth daily. (Patient taking differently: Take 1 tablet daily at 3 pm by mouth. ) 30 tablet 12  . lisinopril-hydrochlorothiazide (PRINZIDE,ZESTORETIC) 10-12.5 MG tablet TAKE 1 TABLET BY MOUTH DAILY 90 tablet 2  . metoprolol succinate (TOPROL-XL) 100 MG 24 hr tablet TAKE 1 TABLET(100 MG) BY MOUTH DAILY 90 tablet 3  . MULTAQ 400 MG tablet TAKE 1 TABLET BY MOUTH TWICE DAILY WITH MEAL 180 tablet 2  . Multiple Vitamin (MULTIVITAMIN WITH MINERALS) TABS tablet Take 1 tablet daily by mouth.    . niacin (NIASPAN) 1000 MG CR tablet Take 1 tablet (1,000 mg total) at bedtime by mouth. 90 tablet 3  . NON FORMULARY CPAP at night    . traMADol (ULTRAM) 50 MG tablet Take 1 tablet (50 mg total) by mouth every 12 (twelve) hours as needed. 30 tablet 2  . XARELTO 20 MG TABS tablet TAKE 1 TABLET BY MOUTH EVERY DAY WITH SUPPER 90 tablet 3   No facility-administered medications prior to visit.      Allergies:   Patient has no known allergies.   Social History   Socioeconomic History  . Marital status: Divorced    Spouse name: Not on file  . Number of children: Not on file  . Years of  education: Not on file  . Highest education level: Not on file  Occupational History  . Not on file  Social Needs  . Financial resource strain: Not on file  . Food insecurity:    Worry: Not on file    Inability: Not on file  . Transportation needs:    Medical: Not on file    Non-medical: Not on file  Tobacco Use  . Smoking status: Former Smoker    Last attempt to quit: 09/01/1994  Years since quitting: 23.2  . Smokeless tobacco: Never Used  Substance and Sexual Activity  . Alcohol use: Yes    Alcohol/week: 0.6 - 1.2 oz    Types: 1 - 2 Glasses of wine per week    Comment: occasional  . Drug use: No  . Sexual activity: Not on file  Lifestyle  . Physical activity:    Days per week: Not on file    Minutes per session: Not on file  . Stress: Not on file  Relationships  . Social connections:    Talks on phone: Not on file    Gets together: Not on file    Attends religious service: Not on file    Active member of club or organization: Not on file    Attends meetings of clubs or organizations: Not on file    Relationship status: Not on file  Other Topics Concern  . Not on file  Social History Narrative   Pt lives in [place] alone.  Divorced.   Retired from [employer])     Family History:  The patient's family history includes Cancer in his father and mother; Leukemia (age of onset: 33) in his daughter; Other in his brother and son.   ROS General: Negative; No fevers, chills, or night sweats;  HEENT: Negative; No changes in vision or hearing, sinus congestion, difficulty swallowing Pulmonary: Negative; No cough, wheezing, shortness of breath, hemoptysis Cardiovascular: History of PAF, CABG revascularization surgery in PVD GI: Negative; No nausea, vomiting, diarrhea, or abdominal pain GU: Negative; No dysuria, hematuria, or difficulty voiding Musculoskeletal: Negative; no myalgias, joint pain, or weakness Hematologic/Oncology: Negative; no easy  bruising, bleeding Endocrine: Negative; no heat/cold intolerance; no diabetes Neuro: Negative; no changes in balance, headaches Skin: Negative; No rashes or skin lesions Psychiatric: Negative; No behavioral problems, depression Sleep: Positive for obstructive sleep apnea on CPAP therapy since 2009.  With treatment there is no snoring, daytime sleepiness, hypersomnolence, bruxism, restless legs, hypnogognic hallucinations, no cataplexy Other comprehensive 14 point system review is negative.   PHYSICAL EXAM:   VS:  BP (!) 132/58   Pulse 65   Ht _0  (1.854 m)   Wt 223 lb 9.6 oz (101.4 kg)   BMI 29.50 kg/m    Repeat blood pressure by me was 128/64   Wt Readings from Last 3 Encounters:  12/07/17 223 lb 9.6 oz (101.4 kg)  08/20/17 229 lb (103.9 kg)  07/21/17 230 lb (104.3 kg)    General: Alert, oriented, no distress.  Skin: normal turgor, no rashes, warm and dry HEENT: Normocephalic, atraumatic. Pupils equal round and reactive to light; sclera anicteric; extraocular muscles intact;  Nose without nasal septal hypertrophy Mouth/Parynx benign; Mallinpatti scale 3 Neck: No JVD, no carotid bruits; normal carotid upstroke Lungs: clear to ausculatation and percussion; no wheezing or rales Chest wall: without tenderness to palpitation Heart: PMI not displaced, RRR, s1 s2 normal, 1/6 systolic murmur, no diastolic murmur, no rubs, gallops, thrills, or heaves Abdomen: soft, nontender; no hepatosplenomehaly, BS+; abdominal aorta nontender and not dilated by palpation. Back: no CVA tenderness Pulses 2+ Musculoskeletal: full range of motion, normal strength, no joint deformities Extremities: no clubbing cyanosis or edema, Homan's sign negative  Neurologic: grossly nonfocal; Cranial nerves grossly wnl Psychologic: Normal mood and affect   Studies/Labs Reviewed:   EKG:  EKG is ordered today.  ECG (independently read by me): Normal sinus rhythm at 65 bpm with occasional PACs.  Right bundle  branch block with repolarization changes.  TC interval _0 ms.  August 20, 2017 ECG (independently read by me):  Sinus bradycardia with sinu arrhythmia.  Right bunde branch block.  Recent Labs: BMP Latest Ref Rng & Units 07/10/2017 06/30/2017 05/28/2017  Glucose 65 - 99 mg/dL 135(H) 126(H) 133(H)  BUN 6 - 20 mg/dL - 11 10  Creatinine 0.61 - 1.24 mg/dL - 1.54(H) 1.23  BUN/Creat Ratio 6 - 22 (calc) - - NOT APPLICABLE  Sodium 093 - 145 mmol/L 140 136 136  Potassium 3.5 - 5.1 mmol/L 3.7 4.2 4.5  Chloride 101 - 111 mmol/L - 96(L) 98  CO2 22 - 32 mmol/L - 30 30  Calcium 8.9 - 10.3 mg/dL - 9.9 9.4     Hepatic Function Latest Ref Rng & Units 05/28/2017 12/24/2016 03/18/2016  Total Protein 6.1 - 8.1 g/dL 7.1 7.3 7.2  Albumin 3.5 - 5.0 g/dL - 4.0 4.1  AST 10 - 35 U/L 22 35 17  ALT 9 - 46 U/L 13 16(L) 11  Alk Phosphatase 38 - 126 U/L - 79 71  Total Bilirubin 0.2 - 1.2 mg/dL 0.9 1.4(H) 1.0  Bilirubin, Direct <=0.2 mg/dL - - -    CBC Latest Ref Rng & Units 07/10/2017 06/30/2017 05/28/2017  WBC 4.0 - 10.5 K/uL - 11.1(H) 6.8  Hemoglobin 13.0 - 17.0 g/dL 15.3 15.3 13.5  Hematocrit 39.0 - 52.0 % 45.0 45.3 39.8  Platelets 150 - 400 K/uL - 181 207   Lab Results  Component Value Date   MCV 91.0 06/30/2017   MCV 86.5 05/28/2017   MCV 91.5 12/24/2016   Lab Results  Component Value Date   TSH 1.185 06/30/2017   Lab Results  Component Value Date   HGBA1C 6.3 (H) 05/28/2017     BNP No results found for: BNP  ProBNP No results found for: PROBNP   Lipid Panel     Component Value Date/Time   CHOL 108 05/28/2017 0858   TRIG 70 05/28/2017 0858   HDL 52 05/28/2017 0858   CHOLHDL 2.1 05/28/2017 0858   VLDL 14 03/18/2016 0822   LDLCALC 41 05/28/2017 0858     RADIOLOGY: No results found.   Additional studies/ records that were reviewed today include:  I reviewed both his sleep study and CPAP titration study from 2009.  I reviewed office records.    ASSESSMENT:    1. OSA  (obstructive sleep apnea)   2. Paroxysmal atrial fibrillation (HCC)   3. Status post coronary artery bypass grafting x 7, 11/16/1996   4. Essential hypertension   5. Peripheral artery disease (HCC)   6. Pure hypercholesterolemia      PLAN:  Mr. Amin Fornwalt is a 47 -year-old Caucasian male who has significant cardiovascular comorbidities including CAD, status post CABG revascularization surgery, PVD, status post bilateral iliac artery stenting and right renal artery stenting, as well as a history of hypertension, hyperlipidemia, and diabetes mellitus.  He  an episode of atrial fibrillation, and remotely had undergone multiple cardioversions in the past.  He is been diagnosed as having sleep apnea since 2009.  Since I last saw him, he received his new CPAP machine, which is a ResMed air sent stent.  AutoSet.  He continues to be 100% compliant.  AHI is 2.4.  He's not having any residual snoring.  He is using a ResMed F 30 fullface mask which she feels is a significant improvement from his previous mask.  He does not have any leak.  Blood pressure today is stable on  lisinopril HCTZ 10/12.5, in addition to Toprol-XL 100 mg.  He continues to be on atorvastatin for hyperlipidemia.  He is rarely used furosemide but has 40 mg in the event of swelling.  He continues to be on anti-coagulation.  He'll continue to follow-up with Dr. Gwenlyn Found and is intermittently seen in the atrial fibrillation clinic.  From a sleep perspective.  I will see him in one year for reevaluation.  Medication Adjustments/Labs and Tests Ordered: Current medicines are reviewed at length with the patient today.  Concerns regarding medicines are outlined above.  Medication changes, Labs and Tests ordered today are listed in the Patient Instructions below. Patient Instructions  Follow-Up: Your physician wants you to follow-up in: 1 year with Dr. Claiborne Billings (sleep clinic). You will receive a reminder letter in the mail two months in advance. If  you don't receive a letter, please call our office to schedule the follow-up appointment.    If you need a refill on your cardiac medications before your next appointment, please call your pharmacy.      Signed, Shelva Majestic, MD  12/09/2017 8:49 PM    [facility] [address] [place], [place], [place]  [address] Phone: [phone]

## 2017-12-07 NOTE — Patient Instructions (Signed)
Follow-Up: Your physician wants you to follow-up in: 1 year with Dr. Claiborne Billings (sleep clinic). You will receive a reminder letter in the mail two months in advance. If you don't receive a letter, please call our office to schedule the follow-up appointment.    If you need a refill on your cardiac medications before your next appointment, please call your pharmacy.

## 2017-12-08 DIAGNOSIS — G4733 Obstructive sleep apnea (adult) (pediatric): Secondary | ICD-10-CM | POA: Diagnosis not present

## 2017-12-09 ENCOUNTER — Encounter: Payer: Self-pay | Admitting: Cardiovascular Disease

## 2017-12-09 DIAGNOSIS — G4733 Obstructive sleep apnea (adult) (pediatric): Secondary | ICD-10-CM | POA: Diagnosis not present

## 2017-12-30 ENCOUNTER — Other Ambulatory Visit: Payer: Self-pay | Admitting: Internal Medicine

## 2017-12-30 DIAGNOSIS — I480 Paroxysmal atrial fibrillation: Secondary | ICD-10-CM

## 2018-01-06 ENCOUNTER — Ambulatory Visit (HOSPITAL_COMMUNITY)
Admission: RE | Admit: 2018-01-06 | Discharge: 2018-01-06 | Disposition: A | Discharge status: Home / Self Care | Payer: Medicare Other | Source: Ambulatory Visit | Attending: Nurse Practitioner | Admitting: Nurse Practitioner

## 2018-01-06 ENCOUNTER — Other Ambulatory Visit: Payer: Self-pay | Admitting: Family Medicine

## 2018-01-06 ENCOUNTER — Encounter (HOSPITAL_COMMUNITY): Payer: Self-pay | Admitting: Nurse Practitioner

## 2018-01-06 VITALS — BP 122/64 | HR 62 | Ht 73.0 in | Wt 223.0 lb

## 2018-01-06 DIAGNOSIS — I739 Peripheral vascular disease, unspecified: Secondary | ICD-10-CM | POA: Insufficient documentation

## 2018-01-06 DIAGNOSIS — I480 Paroxysmal atrial fibrillation: Secondary | ICD-10-CM | POA: Insufficient documentation

## 2018-01-06 DIAGNOSIS — Z951 Presence of aortocoronary bypass graft: Secondary | ICD-10-CM | POA: Diagnosis not present

## 2018-01-06 DIAGNOSIS — E119 Type 2 diabetes mellitus without complications: Secondary | ICD-10-CM | POA: Insufficient documentation

## 2018-01-06 DIAGNOSIS — Z79899 Other long term (current) drug therapy: Secondary | ICD-10-CM | POA: Insufficient documentation

## 2018-01-06 DIAGNOSIS — I10 Essential (primary) hypertension: Secondary | ICD-10-CM | POA: Insufficient documentation

## 2018-01-06 DIAGNOSIS — Z7901 Long term (current) use of anticoagulants: Secondary | ICD-10-CM | POA: Insufficient documentation

## 2018-01-06 DIAGNOSIS — I2510 Atherosclerotic heart disease of native coronary artery without angina pectoris: Secondary | ICD-10-CM | POA: Insufficient documentation

## 2018-01-06 DIAGNOSIS — G4733 Obstructive sleep apnea (adult) (pediatric): Secondary | ICD-10-CM | POA: Diagnosis not present

## 2018-01-06 DIAGNOSIS — Z87891 Personal history of nicotine dependence: Secondary | ICD-10-CM | POA: Diagnosis not present

## 2018-01-06 DIAGNOSIS — E785 Hyperlipidemia, unspecified: Secondary | ICD-10-CM | POA: Insufficient documentation

## 2018-01-06 LAB — CBC
HCT: 44.3 % (ref 39.0–52.0)
Hemoglobin: 14.8 g/dL (ref 13.0–17.0)
MCH: 32.2 pg (ref 26.0–34.0)
MCHC: 33.4 g/dL (ref 30.0–36.0)
MCV: 96.3 fL (ref 78.0–100.0)
Platelets: UNDETERMINED 10*3/uL (ref 150–400)
RBC: 4.6 MIL/uL (ref 4.22–5.81)
RDW: 15.2 % (ref 11.5–15.5)
WBC: 6.8 10*3/uL (ref 4.0–10.5)

## 2018-01-06 LAB — BASIC METABOLIC PANEL
Anion gap: 9 (ref 5–15)
BUN: 9 mg/dL (ref 6–20)
CO2: 29 mmol/L (ref 22–32)
Calcium: 9.4 mg/dL (ref 8.9–10.3)
Chloride: 98 mmol/L — ABNORMAL LOW (ref 101–111)
Creatinine, Ser: 1.24 mg/dL (ref 0.61–1.24)
GFR calc Af Amer: >60 mL/min (ref >60)
GFR calc non Af Amer: 58 mL/min — ABNORMAL LOW (ref >60)
Glucose, Bld: 150 mg/dL — ABNORMAL HIGH (ref 65–99)
Potassium: 4.3 mmol/L (ref 3.5–5.1)
Sodium: 136 mmol/L (ref 135–145)

## 2018-01-06 LAB — HEMOGLOBIN A1C
Hgb A1c MFr Bld: 6.3 % — ABNORMAL HIGH (ref 4.8–5.6)
Mean Plasma Glucose: 134.11 mg/dL

## 2018-01-06 NOTE — Progress Notes (Signed)
Primary Care Physician: Gregor Hams, MD Referring Physician: Dr. Gwenlyn Found EP: Dr. Casandra Doffing [name] is a 72 y.o. male with a h/o  PAF, OSA using cpap, HTN, CAD, s/p bypass, s/p ablation x 2, that is here for f/u, on Multaq, as he has noted increased heart rates since last Thursday night. He had been to a bar to meet a friend and had two drinks and the fast rhythm started 2 hours later. He has tried to increase BB but has it  not touched the rate. He is tolerating fairly well, but notices more nervousness. He tracks his HR on his phone app. He saw Dr. Rayann Heman  last  04/04/15 at which time he was offered repeat ablation,  Multaq, sotalol or tikosyn. He chose multaq and has kept him in rhythm  x 2 years. Otherwise health has been at his baseline. Continues on xarelto without any missed doses.  F/u 07/21/17, f/u after cardioversion. He is in SR. He feels well.   F/u in afib clinic, 01/06/18, he reports that he has not had any afib. Continues on multaq. No bleeding issues.  Today, he denies symptoms of palpitations, chest pain, shortness of breath, orthopnea, PND, lower extremity edema, dizziness, presyncope, syncope, or neurologic sequela. The patient is tolerating medications without difficulties and is otherwise without complaint today.   Past Medical History:  Diagnosis Date  . Atrial fibrillation ([place])   . Coronary artery disease   . DM2 (diabetes mellitus, type 2) (HCC)    diet controlled  . HLD (hyperlipidemia)   . HTN (hypertension)   . OSA (obstructive sleep apnea)    uses CPAP  . Peripheral artery disease ([name])    pseudoaneurysm post afib ablation at [facility] 2011, s/p bilateral iliac stents  . Renal artery stenosis (HCC)    right renal artery PTA and stenting  . S/P coronary artery bypass graft x 7 11/16/96   Past Surgical History:  Procedure Laterality Date  . 2-D echocardiogram  03/25/2010   Normal left ventricular function. Mild MR, TR, trivial AR  . ATRIAL FIBRILLATION  ABLATION  03/27/2010, 12/31/2010   [facility], Dr. Nadeen Landau  . BACK SURGERY  2007  . cardiac stress test  11/21/2009   Exercise capacity 5 METS. No significant ischemia demonstrated  . CARDIOVERSION N/A 03/15/2015   Procedure: CARDIOVERSION;  Surgeon: Lorretta Harp, MD;  Location: [facility] ENDOSCOPY;  Service: Cardiovascular;  Laterality: N/A;  . CARDIOVERSION N/A 07/10/2017   Procedure: CARDIOVERSION;  Surgeon: Pixie Casino, MD;  Location: [place];  Service: Cardiovascular;  Laterality: N/A;  . CORONARY ARTERY BYPASS GRAFT  1998  . ORCHIECTOMY  1981   for cancer  . TOOTH EXTRACTION  05/27/13   tooth extraction with bone graft    Current Outpatient Medications  Medication Sig Dispense Refill  . acetaminophen (TYLENOL) 500 MG tablet Take 1,000 mg every 6 (six) hours as needed by mouth (for pain.).    [name] atorvastatin (LIPITOR) 10 MG tablet TAKE 1 TABLET(10 MG) BY MOUTH DAILY 90 tablet 3  . cetirizine (ZYRTEC) 10 MG tablet Take 10 mg by mouth daily.    [name] gabapentin (NEURONTIN) 300 MG capsule TAKE 2 CAPSULES(600 MG) BY MOUTH AT BEDTIME 180 capsule 0  . lisinopril-hydrochlorothiazide (PRINZIDE,ZESTORETIC) 10-12.5 MG tablet TAKE 1 TABLET BY MOUTH DAILY 90 tablet 2  . metoprolol succinate (TOPROL-XL) 100 MG 24 hr tablet TAKE 1 TABLET(100 MG) BY MOUTH DAILY 90 tablet 3  . MULTAQ 400 MG tablet TAKE 1  TABLET BY MOUTH TWICE DAILY WITH MEAL 180 tablet 2  . Multiple Vitamin (MULTIVITAMIN WITH MINERALS) TABS tablet Take 1 tablet daily by mouth.    . niacin (NIASPAN) 1000 MG CR tablet Take 1 tablet (1,000 mg total) at bedtime by mouth. 90 tablet 3  . NON FORMULARY CPAP at night    . traMADol (ULTRAM) 50 MG tablet Take 1 tablet (50 mg total) by mouth every 12 (twelve) hours as needed. 30 tablet 2  . XARELTO 20 MG TABS tablet TAKE 1 TABLET BY MOUTH EVERY DAY WITH SUPPER 90 tablet 1  . furosemide (LASIX) 40 MG tablet Take 40 mg by mouth daily as needed (swelling). Reported on 09/14/2015    . Iron-Folic  [id number] (FERRAPLUS 90) 90-1 MG TABS Take 1 tablet by mouth daily. (Patient not taking: Reported on 01/06/2018) 30 tablet 12   No current facility-administered medications for this encounter.     No Known Allergies  Social History   Socioeconomic History  . Marital status: Divorced    Spouse name: Not on file  . Number of children: Not on file  . Years of education: Not on file  . Highest education level: Not on file  Occupational History  . Not on file  Social Needs  . Financial resource strain: Not on file  . Food insecurity:    Worry: Not on file    Inability: Not on file  . Transportation needs:    Medical: Not on file    Non-medical: Not on file  Tobacco Use  . Smoking status: Former Smoker    Last attempt to quit: 09/01/1994    Years since quitting: 23.3  . Smokeless tobacco: Never Used  Substance and Sexual Activity  . Alcohol use: Yes    Alcohol/week: 0.6 - 1.2 oz    Types: 1 - 2 Glasses of wine per week    Comment: occasional  . Drug use: No  . Sexual activity: Not on file  Lifestyle  . Physical activity:    Days per week: Not on file    Minutes per session: Not on file  . Stress: Not on file  Relationships  . Social connections:    Talks on phone: Not on file    Gets together: Not on file    Attends religious service: Not on file    Active member of club or organization: Not on file    Attends meetings of clubs or organizations: Not on file    Relationship status: Not on file  . Intimate partner violence:    Fear of current or ex partner: Not on file    Emotionally abused: Not on file    Physically abused: Not on file    Forced sexual activity: Not on file  Other Topics Concern  . Not on file  Social History Narrative   Pt lives in [place] alone.  Divorced.   Retired from [employer])    Family History  Problem Relation Age of Onset  . Cancer Mother   . Cancer Father   . Other Brother        NO MEDICAL PROBLEMS    . Other Son        NO MEDICAL PROBLEMS  . Leukemia Daughter 6       Recover and has no other problems    ROS- All systems are reviewed and negative except as per the HPI above  Physical Exam: Vitals:   01/06/18 1032  BP: 122/64  Pulse: 62  Weight: 223 lb (101.2 kg)  Height: 6\' 1"  (1.854 m)   Wt Readings from Last 3 Encounters:  01/06/18 223 lb (101.2 kg)  12/07/17 223 lb 9.6 oz (101.4 kg)  08/20/17 229 lb (103.9 kg)    Labs: Lab Results  Component Value Date   NA 136 01/06/2018   K 4.3 01/06/2018   CL 98 (L) 01/06/2018   CO2 29 01/06/2018   GLUCOSE 150 (H) 01/06/2018   BUN 9 01/06/2018   CREATININE 1.24 01/06/2018   CALCIUM 9.4 01/06/2018   Lab Results  Component Value Date   INR 2.0 04/04/2015   Lab Results  Component Value Date   CHOL 108 05/28/2017   HDL 52 05/28/2017   LDLCALC 41 05/28/2017   TRIG 70 05/28/2017     GEN- The patient is well appearing, alert and oriented x 3 today.   Head- normocephalic, atraumatic Eyes-  Sclera clear, conjunctiva pink Ears- hearing intact Oropharynx- clear Neck- supple, no JVP Lymph- no cervical lymphadenopathy Lungs- Clear to ausculation bilaterally, normal work of breathing Heart- Regular fast rate and rhythm, no murmurs, rubs or gallops, PMI not laterally displaced GI- soft, NT, ND, + BS Extremities- no clubbing, cyanosis, or edema MS- no significant deformity or atrophy Skin- no rash or lesion Psych- euthymic mood, full affect Neuro- strength and sensation are intact  EKG- NSR at 58 bpm, LBBB, pr int 190 ms, qrs int 142 ms, qtc 449 ms  Epic records reviewed   Assessment and Plan: 1.  Paroxysmal afib  Quiet recently  Pt has done well with multaq and does not want to consider repeat ablation or change in antiarrythmic at this point He is taking xarelto 20 mg a day for chadsvasc score of at least 4, no missed doses Cbc/Hg A1c/ bnmet today   F/u with afib clinic in 1 year Dr. Gwenlyn Found as scheduled  Geroge Baseman. Shondell Poulson, [facility] [address] [place], [place] [address] [phone]

## 2018-01-08 NOTE — Telephone Encounter (Signed)
Patient needs follow up appointment with me every 3 months for chronic opiates (tramadol in this case). Med will be refilled but patient needs to schedule with me.  These are [facility] policy guidelines.

## 2018-01-25 ENCOUNTER — Other Ambulatory Visit: Payer: Self-pay | Admitting: Family Medicine

## 2018-02-15 ENCOUNTER — Encounter: Payer: Self-pay | Admitting: Family Medicine

## 2018-02-23 ENCOUNTER — Ambulatory Visit (HOSPITAL_COMMUNITY)
Admission: RE | Admit: 2018-02-23 | Discharge: 2018-02-23 | Disposition: A | Discharge status: Home / Self Care | Payer: Medicare Other | Source: Ambulatory Visit | Attending: Nurse Practitioner | Admitting: Nurse Practitioner

## 2018-02-23 ENCOUNTER — Encounter (HOSPITAL_COMMUNITY): Payer: Self-pay | Admitting: Nurse Practitioner

## 2018-02-23 VITALS — BP 122/70 | HR 100 | Ht 73.0 in | Wt 223.1 lb

## 2018-02-23 DIAGNOSIS — Z95820 Peripheral vascular angioplasty status with implants and grafts: Secondary | ICD-10-CM | POA: Diagnosis not present

## 2018-02-23 DIAGNOSIS — Z951 Presence of aortocoronary bypass graft: Secondary | ICD-10-CM | POA: Diagnosis not present

## 2018-02-23 DIAGNOSIS — I480 Paroxysmal atrial fibrillation: Secondary | ICD-10-CM | POA: Insufficient documentation

## 2018-02-23 DIAGNOSIS — Z87891 Personal history of nicotine dependence: Secondary | ICD-10-CM | POA: Diagnosis not present

## 2018-02-23 DIAGNOSIS — E785 Hyperlipidemia, unspecified: Secondary | ICD-10-CM | POA: Insufficient documentation

## 2018-02-23 DIAGNOSIS — I10 Essential (primary) hypertension: Secondary | ICD-10-CM | POA: Diagnosis not present

## 2018-02-23 DIAGNOSIS — I2510 Atherosclerotic heart disease of native coronary artery without angina pectoris: Secondary | ICD-10-CM | POA: Insufficient documentation

## 2018-02-23 DIAGNOSIS — G4733 Obstructive sleep apnea (adult) (pediatric): Secondary | ICD-10-CM | POA: Insufficient documentation

## 2018-02-23 DIAGNOSIS — E119 Type 2 diabetes mellitus without complications: Secondary | ICD-10-CM | POA: Diagnosis not present

## 2018-02-23 DIAGNOSIS — I4892 Unspecified atrial flutter: Secondary | ICD-10-CM

## 2018-02-23 DIAGNOSIS — Z7901 Long term (current) use of anticoagulants: Secondary | ICD-10-CM | POA: Insufficient documentation

## 2018-02-23 DIAGNOSIS — Z79899 Other long term (current) drug therapy: Secondary | ICD-10-CM | POA: Diagnosis not present

## 2018-02-23 NOTE — Progress Notes (Addendum)
Primary Care Physician: Gregor Hams, MD Referring Physician: Dr. Gwenlyn Found EP: Dr. Casandra Doffing Verhoeven is a 72 y.o. male with a h/o  PAF, OSA using cpap, HTN, CAD, s/p bypass, s/p ablation x 2, that is here for f/u, on Multaq, as he has had afib since last week occurring on vacation  while he was at the beach.  He is tolerating fairly well, but notices more nervousness. He tracks his HR on his phone app. He saw Dr. Rayann Heman  last  04/04/15 at which time he was offered repeat ablation,  Multaq, sotalol or tikosyn. He chose multaq and has kept him in rhythm  X almost 3 years. He did require a cardioversion 7 months ago.  Otherwise health has been at his baseline. Continues on xarelto without any missed doses. His qt is of concern usually around 500 ms,(RBBB contributing ), longer today in atrial flutter. He is fatigued out of rhythm and prefers to be in SR. He also prefers to avoid another ablation.   Today, he denies symptoms of palpitations, chest pain, shortness of breath, orthopnea, PND, lower extremity edema, dizziness, presyncope, syncope, or neurologic sequela. The patient is tolerating medications without difficulties and is otherwise without complaint today.   Past Medical History:  Diagnosis Date  . Atrial fibrillation ([place])   . Coronary artery disease   . DM2 (diabetes mellitus, type 2) ([facility])    diet controlled  . HLD (hyperlipidemia)   . HTN (hypertension)   . OSA (obstructive sleep apnea)    uses CPAP  . Peripheral artery disease ([place])    pseudoaneurysm post afib ablation at [facility] 2011, s/p bilateral iliac stents  . Renal artery stenosis ([facility])    right renal artery PTA and stenting  . S/P coronary artery bypass graft x 7 11/16/96   Past Surgical History:  Procedure Laterality Date  . 2-D echocardiogram  03/25/2010   Normal left ventricular function. Mild MR, TR, trivial AR  . ATRIAL FIBRILLATION ABLATION  03/27/2010, 12/31/2010   [facility], Dr. Nadeen Landau  . BACK SURGERY  2007  .  cardiac stress test  11/21/2009   Exercise capacity 5 METS. No significant ischemia demonstrated  . CARDIOVERSION N/A 03/15/2015   Procedure: CARDIOVERSION;  Surgeon: Lorretta Harp, MD;  Location: [facility] ENDOSCOPY;  Service: Cardiovascular;  Laterality: N/A;  . CARDIOVERSION N/A 07/10/2017   Procedure: CARDIOVERSION;  Surgeon: Pixie Casino, MD;  Location: [facility];  Service: Cardiovascular;  Laterality: N/A;  . CORONARY ARTERY BYPASS GRAFT  1998  . ORCHIECTOMY  1981   for cancer  . TOOTH EXTRACTION  05/27/13   tooth extraction with bone graft    Current Outpatient Medications  Medication Sig Dispense Refill  . acetaminophen (TYLENOL) 500 MG tablet Take 1,000 mg every 6 (six) hours as needed by mouth (for pain.).    [name] atorvastatin (LIPITOR) 10 MG tablet TAKE 1 TABLET(10 MG) BY MOUTH DAILY 90 tablet 3  . cetirizine (ZYRTEC) 10 MG tablet Take 10 mg by mouth daily.    . furosemide (LASIX) 40 MG tablet Take 40 mg by mouth daily as needed (swelling). Reported on 09/14/2015    . gabapentin (NEURONTIN) 300 MG capsule TAKE 2 CAPSULES(600 MG) BY MOUTH AT BEDTIME 180 capsule 0  . lisinopril-hydrochlorothiazide (PRINZIDE,ZESTORETIC) 10-12.5 MG tablet TAKE 1 TABLET BY MOUTH DAILY 90 tablet 2  . metoprolol succinate (TOPROL-XL) 100 MG 24 hr tablet TAKE 1 TABLET(100 MG) BY MOUTH DAILY 90 tablet 3  . MULTAQ 400  MG tablet TAKE 1 TABLET BY MOUTH TWICE DAILY WITH MEAL 180 tablet 2  . Multiple Vitamin (MULTIVITAMIN WITH MINERALS) TABS tablet Take 1 tablet daily by mouth.    . niacin (NIASPAN) 1000 MG CR tablet Take 1 tablet (1,000 mg total) at bedtime by mouth. 90 tablet 3  . traMADol (ULTRAM) 50 MG tablet TAKE 1 TABLET BY MOUTH EVERY 12 HOURS AS NEEDED 30 tablet 0  . XARELTO 20 MG TABS tablet TAKE 1 TABLET BY MOUTH EVERY DAY WITH SUPPER 90 tablet 1  . Iron-Folic [id number] (FERRAPLUS 90) 90-1 MG TABS Take 1 tablet by mouth daily. (Patient not taking: Reported on 01/06/2018) 30 tablet 12  . NON  FORMULARY CPAP at night     No current facility-administered medications for this encounter.     No Known Allergies  Social History   Socioeconomic History  . Marital status: Divorced    Spouse name: Not on file  . Number of children: Not on file  . Years of education: Not on file  . Highest education level: Not on file  Occupational History  . Not on file  Social Needs  . Financial resource strain: Not on file  . Food insecurity:    Worry: Not on file    Inability: Not on file  . Transportation needs:    Medical: Not on file    Non-medical: Not on file  Tobacco Use  . Smoking status: Former Smoker    Last attempt to quit: 09/01/1994    Years since quitting: 23.4  . Smokeless tobacco: Never Used  Substance and Sexual Activity  . Alcohol use: Yes    Alcohol/week: 0.6 - 1.2 oz    Types: 1 - 2 Glasses of wine per week    Comment: occasional  . Drug use: No  . Sexual activity: Not on file  Lifestyle  . Physical activity:    Days per week: Not on file    Minutes per session: Not on file  . Stress: Not on file  Relationships  . Social connections:    Talks on phone: Not on file    Gets together: Not on file    Attends religious service: Not on file    Active member of club or organization: Not on file    Attends meetings of clubs or organizations: Not on file    Relationship status: Not on file  . Intimate partner violence:    Fear of current or ex partner: Not on file    Emotionally abused: Not on file    Physically abused: Not on file    Forced sexual activity: Not on file  Other Topics Concern  . Not on file  Social History Narrative   Pt lives in [place] alone.  Divorced.   Retired from [employer])    Family History  Problem Relation Age of Onset  . Cancer Mother   . Cancer Father   . Other Brother        NO MEDICAL PROBLEMS  . Other Son        NO MEDICAL PROBLEMS  . Leukemia Daughter 6       Recover and has no other problems     ROS- All systems are reviewed and negative except as per the HPI above  Physical Exam: Vitals:   02/23/18 1029  BP: 122/70  Pulse: 100  Weight: 223 lb 1.6 oz (101.2 kg)  Height: 6\' 1"  (1.854 m)   Wt Readings from Last  3 Encounters:  02/23/18 223 lb 1.6 oz (101.2 kg)  01/06/18 223 lb (101.2 kg)  12/07/17 223 lb 9.6 oz (101.4 kg)    Labs: Lab Results  Component Value Date   NA 136 01/06/2018   K 4.3 01/06/2018   CL 98 (L) 01/06/2018   CO2 29 01/06/2018   GLUCOSE 150 (H) 01/06/2018   BUN 9 01/06/2018   CREATININE 1.24 01/06/2018   CALCIUM 9.4 01/06/2018   Lab Results  Component Value Date   INR 2.0 04/04/2015   Lab Results  Component Value Date   CHOL 108 05/28/2017   HDL 52 05/28/2017   LDLCALC 41 05/28/2017   TRIG 70 05/28/2017     GEN- The patient is well appearing, alert and oriented x 3 today.   Head- normocephalic, atraumatic Eyes-  Sclera clear, conjunctiva pink Ears- hearing intact Oropharynx- clear Neck- supple, no JVP Lymph- no cervical lymphadenopathy Lungs- Clear to ausculation bilaterally, normal work of breathing Heart-irregular fast rate and rhythm, no murmurs, rubs or gallops, PMI not laterally displaced GI- soft, NT, ND, + BS Extremities- no clubbing, cyanosis, or edema MS- no significant deformity or atrophy Skin- no rash or lesion Psych- euthymic mood, full affect Neuro- strength and sensation are intact  EKG- Atrial  flutter with variable AV block with PVC's, RBBB  Epic records reviewed   Assessment and Plan: 1.  Paroxysmal afib   Recent return to atrial flutter with successful cardioversion 7 months ago Pt did not tolerate amiodarone in the past and has been offered tikosyn, sotalol or repeat ablation when last seen by Dr. Rayann Heman in 2016. Pt is interested in changing antiarrythmic's but will I will need to discuss with Dr. Rayann Heman re qt length  and if too long to try Tikosyn or sotalol. He is taking xarelto 20 mg a day for  chadsvasc score of at least 4, no missed doses If he does go to [facility], he will need to wash out multaq and HCTZ 3 days before start of drug If qtc is too long to try Tikosyn or sotalol , cardioversion may be the preferred option   I will let pt know the plan when I hear back from Dr. Rayann Heman  Addendum: 7/23- When discussed with Dr. Rayann Heman, he did not think pt would be a candidate for repeat ablation or tikosyn for h/o prolonged qtc and thought it would be best to proceed with cardioversion, continue multaq.. Since pt is single, he had to arrange for  a ride and caused the cardioversion to be pushed out. He is here for labs and will proceed to endoscopy for cardioversion. He has not missed any anticoagulation for the last 30 days.  Geroge Baseman Sekai Nayak, [facility] [address] [place], [place] [address] [phone]

## 2018-02-23 NOTE — H&P (View-Only) (Signed)
Primary Care Physician: Gregor Hams, MD Referring Physician: Dr. Gwenlyn Found EP: Dr. Casandra Doffing Koob is a 72 y.o. male with a h/o  PAF, OSA using cpap, HTN, CAD, s/p bypass, s/p ablation x 2, that is here for f/u, on Multaq, as he has had afib since last week occurring on vacation  while he was at the beach.  He is tolerating fairly well, but notices more nervousness. He tracks his HR on his phone app. He saw Dr. Rayann Heman  last  04/04/15 at which time he was offered repeat ablation,  Multaq, sotalol or tikosyn. He chose multaq and has kept him in rhythm  X almost 3 years. He did require a cardioversion 7 months ago.  Otherwise health has been at his baseline. Continues on xarelto without any missed doses. His qt is of concern usually around 500 ms,(RBBB contributing ), longer today in atrial flutter. He is fatigued out of rhythm and prefers to be in SR. He also prefers to avoid another ablation.   Today, he denies symptoms of palpitations, chest pain, shortness of breath, orthopnea, PND, lower extremity edema, dizziness, presyncope, syncope, or neurologic sequela. The patient is tolerating medications without difficulties and is otherwise without complaint today.   Past Medical History:  Diagnosis Date  . Atrial fibrillation ([place])   . Coronary artery disease   . DM2 (diabetes mellitus, type 2) (HCC)    diet controlled  . HLD (hyperlipidemia)   . HTN (hypertension)   . OSA (obstructive sleep apnea)    uses CPAP  . Peripheral artery disease ([place])    pseudoaneurysm post afib ablation at [facility] 2011, s/p bilateral iliac stents  . Renal artery stenosis ([facility])    right renal artery PTA and stenting  . S/P coronary artery bypass graft x 7 11/16/96   Past Surgical History:  Procedure Laterality Date  . 2-D echocardiogram  03/25/2010   Normal left ventricular function. Mild MR, TR, trivial AR  . ATRIAL FIBRILLATION ABLATION  03/27/2010, 12/31/2010   [facility], Dr. Nadeen Landau  . BACK SURGERY  2007  .  cardiac stress test  11/21/2009   Exercise capacity 5 METS. No significant ischemia demonstrated  . CARDIOVERSION N/A 03/15/2015   Procedure: CARDIOVERSION;  Surgeon: Lorretta Harp, MD;  Location: [facility] ENDOSCOPY;  Service: Cardiovascular;  Laterality: N/A;  . CARDIOVERSION N/A 07/10/2017   Procedure: CARDIOVERSION;  Surgeon: Pixie Casino, MD;  Location: [facility];  Service: Cardiovascular;  Laterality: N/A;  . CORONARY ARTERY BYPASS GRAFT  1998  . ORCHIECTOMY  1981   for cancer  . TOOTH EXTRACTION  05/27/13   tooth extraction with bone graft    Current Outpatient Medications  Medication Sig Dispense Refill  . acetaminophen (TYLENOL) 500 MG tablet Take 1,000 mg every 6 (six) hours as needed by mouth (for pain.).    [name] atorvastatin (LIPITOR) 10 MG tablet TAKE 1 TABLET(10 MG) BY MOUTH DAILY 90 tablet 3  . cetirizine (ZYRTEC) 10 MG tablet Take 10 mg by mouth daily.    . furosemide (LASIX) 40 MG tablet Take 40 mg by mouth daily as needed (swelling). Reported on 09/14/2015    . gabapentin (NEURONTIN) 300 MG capsule TAKE 2 CAPSULES(600 MG) BY MOUTH AT BEDTIME 180 capsule 0  . lisinopril-hydrochlorothiazide (PRINZIDE,ZESTORETIC) 10-12.5 MG tablet TAKE 1 TABLET BY MOUTH DAILY 90 tablet 2  . metoprolol succinate (TOPROL-XL) 100 MG 24 hr tablet TAKE 1 TABLET(100 MG) BY MOUTH DAILY 90 tablet 3  . MULTAQ 400  MG tablet TAKE 1 TABLET BY MOUTH TWICE DAILY WITH MEAL 180 tablet 2  . Multiple Vitamin (MULTIVITAMIN WITH MINERALS) TABS tablet Take 1 tablet daily by mouth.    . niacin (NIASPAN) 1000 MG CR tablet Take 1 tablet (1,000 mg total) at bedtime by mouth. 90 tablet 3  . traMADol (ULTRAM) 50 MG tablet TAKE 1 TABLET BY MOUTH EVERY 12 HOURS AS NEEDED 30 tablet 0  . XARELTO 20 MG TABS tablet TAKE 1 TABLET BY MOUTH EVERY DAY WITH SUPPER 90 tablet 1  . Iron-Folic [id number] (FERRAPLUS 90) 90-1 MG TABS Take 1 tablet by mouth daily. (Patient not taking: Reported on 01/06/2018) 30 tablet 12  . NON  FORMULARY CPAP at night     No current facility-administered medications for this encounter.     No Known Allergies  Social History   Socioeconomic History  . Marital status: Divorced    Spouse name: Not on file  . Number of children: Not on file  . Years of education: Not on file  . Highest education level: Not on file  Occupational History  . Not on file  Social Needs  . Financial resource strain: Not on file  . Food insecurity:    Worry: Not on file    Inability: Not on file  . Transportation needs:    Medical: Not on file    Non-medical: Not on file  Tobacco Use  . Smoking status: Former Smoker    Last attempt to quit: 09/01/1994    Years since quitting: 23.4  . Smokeless tobacco: Never Used  Substance and Sexual Activity  . Alcohol use: Yes    Alcohol/week: 0.6 - 1.2 oz    Types: 1 - 2 Glasses of wine per week    Comment: occasional  . Drug use: No  . Sexual activity: Not on file  Lifestyle  . Physical activity:    Days per week: Not on file    Minutes per session: Not on file  . Stress: Not on file  Relationships  . Social connections:    Talks on phone: Not on file    Gets together: Not on file    Attends religious service: Not on file    Active member of club or organization: Not on file    Attends meetings of clubs or organizations: Not on file    Relationship status: Not on file  . Intimate partner violence:    Fear of current or ex partner: Not on file    Emotionally abused: Not on file    Physically abused: Not on file    Forced sexual activity: Not on file  Other Topics Concern  . Not on file  Social History Narrative   Pt lives in [place] alone.  Divorced.   Retired from [employer])    Family History  Problem Relation Age of Onset  . Cancer Mother   . Cancer Father   . Other Brother        NO MEDICAL PROBLEMS  . Other Son        NO MEDICAL PROBLEMS  . Leukemia Daughter 6       Recover and has no other problems     ROS- All systems are reviewed and negative except as per the HPI above  Physical Exam: Vitals:   02/23/18 1029  BP: 122/70  Pulse: 100  Weight: 223 lb 1.6 oz (101.2 kg)  Height: 6\' 1"  (1.854 m)   Wt Readings from Last  3 Encounters:  02/23/18 223 lb 1.6 oz (101.2 kg)  01/06/18 223 lb (101.2 kg)  12/07/17 223 lb 9.6 oz (101.4 kg)    Labs: Lab Results  Component Value Date   NA 136 01/06/2018   K 4.3 01/06/2018   CL 98 (L) 01/06/2018   CO2 29 01/06/2018   GLUCOSE 150 (H) 01/06/2018   BUN 9 01/06/2018   CREATININE 1.24 01/06/2018   CALCIUM 9.4 01/06/2018   Lab Results  Component Value Date   INR 2.0 04/04/2015   Lab Results  Component Value Date   CHOL 108 05/28/2017   HDL 52 05/28/2017   LDLCALC 41 05/28/2017   TRIG 70 05/28/2017     GEN- The patient is well appearing, alert and oriented x 3 today.   Head- normocephalic, atraumatic Eyes-  Sclera clear, conjunctiva pink Ears- hearing intact Oropharynx- clear Neck- supple, no JVP Lymph- no cervical lymphadenopathy Lungs- Clear to ausculation bilaterally, normal work of breathing Heart-irregular fast rate and rhythm, no murmurs, rubs or gallops, PMI not laterally displaced GI- soft, NT, ND, + BS Extremities- no clubbing, cyanosis, or edema MS- no significant deformity or atrophy Skin- no rash or lesion Psych- euthymic mood, full affect Neuro- strength and sensation are intact  EKG- Atrial  flutter with variable AV block with PVC's, RBBB  Epic records reviewed   Assessment and Plan: 1.  Paroxysmal afib   Recent return to atrial flutter with successful cardioversion 7 months ago Pt did not tolerate amiodarone in the past and has been offered tikosyn, sotalol or repeat ablation when last seen by Dr. Rayann Heman in 2016. Pt is interested in changing antiarrythmic's but will I will need to discuss with Dr. Rayann Heman re qt length  and if too long to try Tikosyn or sotalol. He is taking xarelto 20 mg a day for  chadsvasc score of at least 4, no missed doses If he does go to [facility], he will need to wash out multaq and HCTZ 3 days before start of drug If qtc is too long to try Tikosyn or sotalol , cardioversion may be the preferred option   I will let pt know the plan when I hear back from Dr. Rayann Heman  Addendum: 7/23- When discussed with Dr. Rayann Heman, he did not think pt would be a candidate for repeat ablation or tikosyn for h/o prolonged qtc and thought it would be best to proceed with cardioversion, continue multaq.. Since pt is single, he had to arrange for  a ride and caused the cardioversion to be pushed out. He is here for labs and will proceed to endoscopy for cardioversion. He has not missed any anticoagulation for the last 30 days.  Geroge Baseman Donella Pascarella, [facility] [address] [place], [place] [address] [phone]

## 2018-02-24 ENCOUNTER — Other Ambulatory Visit (HOSPITAL_COMMUNITY): Payer: Self-pay | Admitting: *Deleted

## 2018-02-24 ENCOUNTER — Ambulatory Visit (HOSPITAL_COMMUNITY): Payer: Medicare Other | Admitting: Nurse Practitioner

## 2018-03-17 DIAGNOSIS — G4733 Obstructive sleep apnea (adult) (pediatric): Secondary | ICD-10-CM | POA: Diagnosis not present

## 2018-03-23 ENCOUNTER — Other Ambulatory Visit: Payer: Self-pay

## 2018-03-23 ENCOUNTER — Ambulatory Visit (HOSPITAL_COMMUNITY)
Admission: RE | Admit: 2018-03-23 | Discharge: 2018-03-23 | Disposition: A | Discharge status: Home / Self Care | Payer: Medicare Other | Source: Ambulatory Visit | Attending: Cardiovascular Disease | Admitting: Cardiovascular Disease

## 2018-03-23 ENCOUNTER — Encounter (HOSPITAL_COMMUNITY)
Admission: RE | Disposition: A | Discharge status: Home / Self Care | Payer: Self-pay | Source: Ambulatory Visit | Attending: Cardiovascular Disease

## 2018-03-23 ENCOUNTER — Ambulatory Visit (HOSPITAL_COMMUNITY): Payer: Medicare Other | Admitting: Certified Registered"

## 2018-03-23 ENCOUNTER — Encounter (HOSPITAL_COMMUNITY): Payer: Self-pay | Admitting: *Deleted

## 2018-03-23 ENCOUNTER — Ambulatory Visit (HOSPITAL_COMMUNITY)
Admission: RE | Admit: 2018-03-23 | Discharge: 2018-03-23 | Disposition: A | Discharge status: Home / Self Care | Payer: Medicare Other | Source: Ambulatory Visit | Attending: Nurse Practitioner | Admitting: Nurse Practitioner

## 2018-03-23 DIAGNOSIS — E785 Hyperlipidemia, unspecified: Secondary | ICD-10-CM | POA: Diagnosis not present

## 2018-03-23 DIAGNOSIS — E1151 Type 2 diabetes mellitus with diabetic peripheral angiopathy without gangrene: Secondary | ICD-10-CM | POA: Insufficient documentation

## 2018-03-23 DIAGNOSIS — I10 Essential (primary) hypertension: Secondary | ICD-10-CM | POA: Diagnosis not present

## 2018-03-23 DIAGNOSIS — D509 Iron deficiency anemia, unspecified: Secondary | ICD-10-CM | POA: Diagnosis not present

## 2018-03-23 DIAGNOSIS — I701 Atherosclerosis of renal artery: Secondary | ICD-10-CM | POA: Diagnosis not present

## 2018-03-23 DIAGNOSIS — Z87891 Personal history of nicotine dependence: Secondary | ICD-10-CM | POA: Insufficient documentation

## 2018-03-23 DIAGNOSIS — I2510 Atherosclerotic heart disease of native coronary artery without angina pectoris: Secondary | ICD-10-CM | POA: Insufficient documentation

## 2018-03-23 DIAGNOSIS — Z951 Presence of aortocoronary bypass graft: Secondary | ICD-10-CM | POA: Insufficient documentation

## 2018-03-23 DIAGNOSIS — G4733 Obstructive sleep apnea (adult) (pediatric): Secondary | ICD-10-CM | POA: Insufficient documentation

## 2018-03-23 DIAGNOSIS — I484 Atypical atrial flutter: Secondary | ICD-10-CM | POA: Diagnosis not present

## 2018-03-23 DIAGNOSIS — I480 Paroxysmal atrial fibrillation: Secondary | ICD-10-CM | POA: Insufficient documentation

## 2018-03-23 DIAGNOSIS — I4892 Unspecified atrial flutter: Secondary | ICD-10-CM | POA: Diagnosis not present

## 2018-03-23 DIAGNOSIS — Z7901 Long term (current) use of anticoagulants: Secondary | ICD-10-CM | POA: Insufficient documentation

## 2018-03-23 HISTORY — DX: Prediabetes: R73.03

## 2018-03-23 HISTORY — PX: CARDIOVERSION: SHX1299

## 2018-03-23 LAB — CBC
HCT: 45.6 % (ref 39.0–52.0)
Hemoglobin: 15 g/dL (ref 13.0–17.0)
MCH: 31.4 pg (ref 26.0–34.0)
MCHC: 32.9 g/dL (ref 30.0–36.0)
MCV: 95.4 fL (ref 78.0–100.0)
Platelets: 184 10*3/uL (ref 150–400)
RBC: 4.78 MIL/uL (ref 4.22–5.81)
RDW: 14.3 % (ref 11.5–15.5)
WBC: 8.3 10*3/uL (ref 4.0–10.5)

## 2018-03-23 LAB — BASIC METABOLIC PANEL
Anion gap: 8 (ref 5–15)
BUN: 9 mg/dL (ref 8–23)
CO2: 29 mmol/L (ref 22–32)
Calcium: 9.4 mg/dL (ref 8.9–10.3)
Chloride: 100 mmol/L (ref 98–111)
Creatinine, Ser: 1.24 mg/dL (ref 0.61–1.24)
GFR calc Af Amer: >60 mL/min (ref >60)
GFR calc non Af Amer: 58 mL/min — ABNORMAL LOW (ref >60)
Glucose, Bld: 139 mg/dL — ABNORMAL HIGH (ref 70–99)
Potassium: 4 mmol/L (ref 3.5–5.1)
Sodium: 137 mmol/L (ref 135–145)

## 2018-03-23 SURGERY — CARDIOVERSION
Anesthesia: General

## 2018-03-23 MED ORDER — SODIUM CHLORIDE 0.9 % IV SOLN
INTRAVENOUS | Status: DC
  Administered 2018-03-23: 11:00:00 via INTRAVENOUS

## 2018-03-23 MED ORDER — LIDOCAINE HCL (CARDIAC) PF 100 MG/5ML IV SOSY
PREFILLED_SYRINGE | INTRAVENOUS | Status: DC | PRN
  Administered 2018-03-23: 100 mg via INTRAVENOUS

## 2018-03-23 MED ORDER — PHENYLEPHRINE 40 MCG/ML (10ML) SYRINGE FOR IV PUSH (FOR BLOOD PRESSURE SUPPORT)
PREFILLED_SYRINGE | INTRAVENOUS | Status: DC | PRN
  Administered 2018-03-23 (×2): 80 ug via INTRAVENOUS

## 2018-03-23 MED ORDER — PROPOFOL 10 MG/ML IV BOLUS
INTRAVENOUS | Status: DC | PRN
  Administered 2018-03-23: 65 mg via INTRAVENOUS

## 2018-03-23 NOTE — Discharge Instructions (Signed)
Electrical Cardioversion, Care After °This sheet gives you information about how to care for yourself after your procedure. Your health care provider may also give you more specific instructions. If you have problems or questions, contact your health care provider. °What can I expect after the procedure? °After the procedure, it is common to have: °· Some redness on the skin where the shocks were given. ° °Follow these instructions at home: °· Do not drive for 24 hours if you were given a medicine to help you relax (sedative). °· Take over-the-counter and prescription medicines only as told by your health care provider. °· Ask your health care provider how to check your pulse. Check it often. °· Rest for 48 hours after the procedure or as told by your health care provider. °· Avoid or limit your caffeine use as told by your health care provider. °Contact a health care provider if: °· You feel like your heart is beating too quickly or your pulse is not regular. °· You have a serious muscle cramp that does not go away. °Get help right away if: °· You have discomfort in your chest. °· You are dizzy or you feel faint. °· You have trouble breathing or you are short of breath. °· Your speech is slurred. °· You have trouble moving an arm or leg on one side of your body. °· Your fingers or toes turn cold or blue. °This information is not intended to replace advice given to you by your health care provider. Make sure you discuss any questions you have with your health care provider. °Document Released: 06/08/2013 Document Revised: 03/21/2016 Document Reviewed: 02/22/2016 °Elsevier Interactive Patient Education © 2018 [employer] Inc[employer] ° °

## 2018-03-23 NOTE — Op Note (Signed)
Procedure: Electrical Cardioversion Indications:  Atrial Flutter  Procedure Details:  Consent: Risks of procedure as well as the alternatives and risks of each were explained to the (patient/caregiver).  Consent for procedure obtained.  Time Out: Verified patient identification, verified procedure, site/side was marked, verified correct patient position, special equipment/implants available, medications/allergies/relevent history reviewed, required imaging and test results available.  Performed  Patient placed on cardiac monitor, pulse oximetry, supplemental oxygen as necessary.  Sedation given: propofol 65 mg IV, Dr. Marcell Barlow Pacer pads placed anterior and posterior chest.  Cardioverted 1 time(s).  Cardioversion with synchronized biphasic 120J shock.  Evaluation: Findings: Post procedure EKG shows: NSR. Frequent PACs Complications: None Patient did tolerate procedure well.  Time Spent Directly with the Patient:  30 minutes   Rafiq Bucklin 03/23/2018, 12:22 PM

## 2018-03-23 NOTE — Anesthesia Preprocedure Evaluation (Signed)
Anesthesia Evaluation  Patient identified by MRN, date of birth, ID band Patient awake    Reviewed: Allergy & Precautions, NPO status , Patient's Chart, lab work & pertinent test results  Airway Mallampati: I  TM Distance: >3 FB Neck ROM: Full    Dental  (+) Teeth Intact, Dental Advisory Given   Pulmonary sleep apnea and Continuous Positive Airway Pressure Ventilation , former smoker,    breath sounds clear to auscultation       Cardiovascular hypertension, Pt. on medications and Pt. on home beta blockers + CAD, + CABG and + Peripheral Vascular Disease  + dysrhythmias Atrial Fibrillation  Rhythm:Irregular Rate:Abnormal     Neuro/Psych negative neurological ROS  negative psych ROS   GI/Hepatic negative GI ROS, Neg liver ROS,   Endo/Other  diabetes, Type 2  Renal/GU Renal disease  negative genitourinary   Musculoskeletal   Abdominal   Peds  Hematology   Anesthesia Other Findings   Reproductive/Obstetrics                             Anesthesia Physical  Anesthesia Plan  ASA: III  Anesthesia Plan: General   Post-op Pain Management:    Induction: Intravenous  PONV Risk Score and Plan:   Airway Management Planned: Natural Airway and Mask  Additional Equipment:   Intra-op Plan:   Post-operative Plan:   Informed Consent: I have reviewed the patients History and Physical, chart, labs and discussed the procedure including the risks, benefits and alternatives for the proposed anesthesia with the patient or authorized representative who has indicated his/her understanding and acceptance.     Plan Discussed with: CRNA  Anesthesia Plan Comments:         Anesthesia Quick Evaluation

## 2018-03-23 NOTE — Progress Notes (Signed)
During recovery, the patient's rhythm converted to atrial fibrillation with controlled ventricular rate 60s-70s. Overall duration of normal rhythm was <30 minutes.  Sanda Klein, MD, [facility] [phone] office [phone] pager

## 2018-03-23 NOTE — Transfer of Care (Signed)
Immediate Anesthesia Transfer of Care Note  Patient: Kordae Buonocore Vallone  Procedure(s) Performed: CARDIOVERSION (N/A )  Patient Location: PACU  Anesthesia Type:General  Level of Consciousness: awake, alert , oriented and patient cooperative  Airway & Oxygen Therapy: Patient Spontanous Breathing and Patient connected to nasal cannula oxygen  Post-op Assessment: Report given to RN and Post -op Vital signs reviewed and stable  Post vital signs: Reviewed and stable  Last Vitals:  Vitals Value Taken Time  BP    Temp    Pulse    Resp    SpO2      Last Pain:  Vitals:   03/23/18 1100  TempSrc: Oral  PainSc: 0-No pain         Complications: No apparent anesthesia complications

## 2018-03-23 NOTE — Addendum Note (Signed)
Encounter addended by: Sherran Needs, NP on: 03/23/2018 10:02 AM  Actions taken: Sign clinical note

## 2018-03-23 NOTE — Anesthesia Postprocedure Evaluation (Signed)
Anesthesia Post Note  Patient: [name]  Procedure(s) Performed: CARDIOVERSION (N/A )     Patient location during evaluation: Endoscopy Anesthesia Type: General Level of consciousness: awake and alert Pain management: pain level controlled Vital Signs Assessment: post-procedure vital signs reviewed and stable Respiratory status: spontaneous breathing, nonlabored ventilation, respiratory function stable and patient connected to nasal cannula oxygen Cardiovascular status: blood pressure returned to baseline and stable Postop Assessment: no apparent nausea or vomiting Anesthetic complications: no    Last Vitals:  Vitals:   03/23/18 1100  BP: 108/66  Pulse: 95  Resp: 19  Temp: 37.1 C  SpO2: 97%    Last Pain:  Vitals:   03/23/18 1100  TempSrc: Oral  PainSc: 0-No pain                 Montez Hageman

## 2018-03-23 NOTE — Interval H&P Note (Signed)
History and Physical Interval Note:  03/23/2018 10:20 AM  [name]  has presented today for surgery, with the diagnosis of AFIB  The various methods of treatment have been discussed with the patient and family. After consideration of risks, benefits and other options for treatment, the patient has consented to  Procedure(s): CARDIOVERSION (N/A) as a surgical intervention .  The patient's history has been reviewed, patient examined, no change in status, stable for surgery.  I have reviewed the patient's chart and labs.  Questions were answered to the patient's satisfaction.     [name]

## 2018-03-24 ENCOUNTER — Other Ambulatory Visit (HOSPITAL_COMMUNITY): Payer: Medicare Other | Admitting: Nurse Practitioner

## 2018-03-25 ENCOUNTER — Encounter (HOSPITAL_COMMUNITY): Payer: Self-pay | Admitting: Cardiovascular Disease

## 2018-03-30 ENCOUNTER — Encounter (HOSPITAL_COMMUNITY): Payer: Self-pay | Admitting: Nurse Practitioner

## 2018-03-30 ENCOUNTER — Other Ambulatory Visit (HOSPITAL_COMMUNITY): Payer: Self-pay | Admitting: *Deleted

## 2018-03-30 ENCOUNTER — Other Ambulatory Visit: Payer: Self-pay

## 2018-03-30 ENCOUNTER — Ambulatory Visit (HOSPITAL_COMMUNITY)
Admission: RE | Admit: 2018-03-30 | Discharge: 2018-03-30 | Disposition: A | Discharge status: Home / Self Care | Payer: Medicare Other | Source: Ambulatory Visit | Attending: Nurse Practitioner | Admitting: Nurse Practitioner

## 2018-03-30 VITALS — BP 126/72 | HR 64 | Ht 73.0 in | Wt 226.0 lb

## 2018-03-30 DIAGNOSIS — Z951 Presence of aortocoronary bypass graft: Secondary | ICD-10-CM | POA: Insufficient documentation

## 2018-03-30 DIAGNOSIS — E785 Hyperlipidemia, unspecified: Secondary | ICD-10-CM | POA: Diagnosis not present

## 2018-03-30 DIAGNOSIS — I480 Paroxysmal atrial fibrillation: Secondary | ICD-10-CM | POA: Diagnosis not present

## 2018-03-30 DIAGNOSIS — G4733 Obstructive sleep apnea (adult) (pediatric): Secondary | ICD-10-CM | POA: Insufficient documentation

## 2018-03-30 DIAGNOSIS — R7303 Prediabetes: Secondary | ICD-10-CM | POA: Insufficient documentation

## 2018-03-30 DIAGNOSIS — Z7901 Long term (current) use of anticoagulants: Secondary | ICD-10-CM | POA: Diagnosis not present

## 2018-03-30 DIAGNOSIS — I10 Essential (primary) hypertension: Secondary | ICD-10-CM | POA: Diagnosis not present

## 2018-03-30 DIAGNOSIS — I4892 Unspecified atrial flutter: Secondary | ICD-10-CM

## 2018-03-30 DIAGNOSIS — I4510 Unspecified right bundle-branch block: Secondary | ICD-10-CM | POA: Insufficient documentation

## 2018-03-30 DIAGNOSIS — Z79899 Other long term (current) drug therapy: Secondary | ICD-10-CM | POA: Diagnosis not present

## 2018-03-30 DIAGNOSIS — I2510 Atherosclerotic heart disease of native coronary artery without angina pectoris: Secondary | ICD-10-CM | POA: Diagnosis not present

## 2018-03-30 DIAGNOSIS — Z806 Family history of leukemia: Secondary | ICD-10-CM | POA: Insufficient documentation

## 2018-03-30 DIAGNOSIS — Z87891 Personal history of nicotine dependence: Secondary | ICD-10-CM | POA: Insufficient documentation

## 2018-03-30 MED ORDER — FUROSEMIDE 40 MG PO TABS: 40.0000 mg | ORAL_TABLET | ORAL | 0 refills | Status: DC | PRN

## 2018-03-30 NOTE — Progress Notes (Signed)
Primary Care Physician: Gregor Hams, MD Referring Physician: Dr. Gwenlyn Found EP: Dr. Casandra Doffing [name] is a 72 y.o. male with a h/o  PAF, OSA using cpap, HTN, CAD, s/p bypass, s/p ablation x 2, that is here for f/u, on Multaq, as he has had afib since last week occurring on vacation  while he was at the beach.  He is tolerating fairly well, but notices more nervousness. He tracks his HR on his phone app. He saw Dr. Rayann Heman  last  04/04/15 at which time he was offered repeat ablation,  Multaq, sotalol or tikosyn. He chose multaq and has kept him in rhythm  X almost 3 years. He did require a cardioversion 7 months ago.  Otherwise health has been at his baseline. Continues on xarelto without any missed doses. His qt is of concern usually around 500 ms,(RBBB contributing ), longer today in atrial flutter. He is fatigued out of rhythm and prefers to be in SR. He also prefers to avoid another ablation.   F/u in afib clinic,7/30. Unfortunately, he did not convert with cardioversion, but 3 days later, he returned to [place] , and continues to stay in rhythm. He feels improved. I discussed with Dr. Rayann Heman  prior to [place] as his options are limited. He  thought repeat cardioversion was the best option  for pt.   Today, he denies symptoms of palpitations, chest pain, shortness of breath, orthopnea, PND, lower extremity edema, dizziness, presyncope, syncope, or neurologic sequela. The patient is tolerating medications without difficulties and is otherwise without complaint today.   Past Medical History:  Diagnosis Date  . Atrial fibrillation ([place])   . Coronary artery disease   . HLD (hyperlipidemia)   . HTN (hypertension)   . OSA (obstructive sleep apnea)    uses CPAP  . Peripheral artery disease ([place])    pseudoaneurysm post afib ablation at [facility] 2011, s/p bilateral iliac stents  . Pre-diabetes   . Renal artery stenosis ([facility])    right renal artery PTA and stenting  . S/P coronary artery bypass graft x 7  11/16/96   Past Surgical History:  Procedure Laterality Date  . 2-D echocardiogram  03/25/2010   Normal left ventricular function. Mild MR, TR, trivial AR  . ATRIAL FIBRILLATION ABLATION  03/27/2010, 12/31/2010   [facility], Dr. Nadeen Landau  . BACK SURGERY  2007  . cardiac stress test  11/21/2009   Exercise capacity 5 METS. No significant ischemia demonstrated  . CARDIOVERSION N/A 03/15/2015   Procedure: CARDIOVERSION;  Surgeon: Lorretta Harp, MD;  Location[facility] ENDOSCOPY;  Service: Cardiovascular;  Laterality: N/A;  . CARDIOVERSION N/A 07/10/2017   Procedure: CARDIOVERSION;  Surgeon: Pixie Casino, MD;  Location: [facility] ENDOSCOPY;  Service: Cardiovascular;  Laterality: N/A;  . CARDIOVERSION N/A 03/23/2018   Procedure: CARDIOVERSION;  Surgeon: Sanda Klein, MD;  Location: [facility] ENDOSCOPY;  Service: Cardiovascular;  Laterality: N/A;  . CORONARY ARTERY BYPASS GRAFT  1998  . ORCHIECTOMY  1981   for cancer  . TOOTH EXTRACTION  05/27/13   tooth extraction with bone graft    Current Outpatient Medications  Medication Sig Dispense Refill  . acetaminophen (TYLENOL) 500 MG tablet Take 1,000 mg every 6 (six) hours as needed by mouth (for pain.).    [name] atorvastatin (LIPITOR) 10 MG tablet TAKE 1 TABLET(10 MG) BY MOUTH DAILY 90 tablet 3  . cetirizine (ZYRTEC) 10 MG tablet Take 10 mg by mouth daily.    [name] gabapentin (NEURONTIN) 300 MG capsule  TAKE 2 CAPSULES(600 MG) BY MOUTH AT BEDTIME 180 capsule 0  . lisinopril-hydrochlorothiazide (PRINZIDE,ZESTORETIC) 10-12.5 MG tablet TAKE 1 TABLET BY MOUTH DAILY 90 tablet 2  . metoprolol succinate (TOPROL-XL) 100 MG 24 hr tablet TAKE 1 TABLET(100 MG) BY MOUTH DAILY 90 tablet 3  . MULTAQ 400 MG tablet TAKE 1 TABLET BY MOUTH TWICE DAILY WITH MEAL 180 tablet 2  . Multiple Vitamin (MULTIVITAMIN WITH MINERALS) TABS tablet Take 1 tablet by mouth every other day.     . niacin (NIASPAN) 1000 MG CR tablet Take 1 tablet (1,000 mg total) at bedtime by mouth. 90 tablet 3  . NON FORMULARY  CPAP at night    . traMADol (ULTRAM) 50 MG tablet TAKE 1 TABLET BY MOUTH EVERY 12 HOURS AS NEEDED 30 tablet 0  . XARELTO 20 MG TABS tablet TAKE 1 TABLET BY MOUTH EVERY DAY WITH SUPPER 90 tablet 1  . Iron-Folic [id number] (FERRAPLUS 90) 90-1 MG TABS Take 1 tablet by mouth daily. (Patient not taking: Reported on 01/06/2018) 30 tablet 12   No current facility-administered medications for this encounter.     No Known Allergies  Social History   Socioeconomic History  . Marital status: Divorced    Spouse name: Not on file  . Number of children: Not on file  . Years of education: Not on file  . Highest education level: Not on file  Occupational History  . Not on file  Social Needs  . Financial resource strain: Not on file  . Food insecurity:    Worry: Not on file    Inability: Not on file  . Transportation needs:    Medical: Not on file    Non-medical: Not on file  Tobacco Use  . Smoking status: Former Smoker    Last attempt to quit: 09/01/1994    Years since quitting: 23.5  . Smokeless tobacco: Never Used  Substance and Sexual Activity  . Alcohol use: Yes    Alcohol/week: 0.6 - 1.2 oz    Types: 1 - 2 Glasses of wine per week    Comment: occasional  . Drug use: No  . Sexual activity: Not on file  Lifestyle  . Physical activity:    Days per week: Not on file    Minutes per session: Not on file  . Stress: Not on file  Relationships  . Social connections:    Talks on phone: Not on file    Gets together: Not on file    Attends religious service: Not on file    Active member of club or organization: Not on file    Attends meetings of clubs or organizations: Not on file    Relationship status: Not on file  . Intimate partner violence:    Fear of current or ex partner: Not on file    Emotionally abused: Not on file    Physically abused: Not on file    Forced sexual activity: Not on file  Other Topics Concern  . Not on file  Social History Narrative   Pt lives in  [place] alone.  Divorced.   Retired from [employer])    Family History  Problem Relation Age of Onset  . Cancer Mother   . Cancer Father   . Other Brother        NO MEDICAL PROBLEMS  . Other Son        NO MEDICAL PROBLEMS  . Leukemia Daughter 6       Recover and  has no other problems    ROS- All systems are reviewed and negative except as per the HPI above  Physical Exam: Vitals:   03/30/18 0856  BP: 126/72  Pulse: 64  Weight: 226 lb (102.5 kg)  Height: 6\' 1"  (1.854 m)   Wt Readings from Last 3 Encounters:  03/30/18 226 lb (102.5 kg)  03/23/18 223 lb (101.2 kg)  02/23/18 223 lb 1.6 oz (101.2 kg)    Labs: Lab Results  Component Value Date   NA 137 03/23/2018   K 4.0 03/23/2018   CL 100 03/23/2018   CO2 29 03/23/2018   GLUCOSE 139 (H) 03/23/2018   BUN 9 03/23/2018   CREATININE 1.24 03/23/2018   CALCIUM 9.4 03/23/2018   Lab Results  Component Value Date   INR 2.0 04/04/2015   Lab Results  Component Value Date   CHOL 108 05/28/2017   HDL 52 05/28/2017   LDLCALC 41 05/28/2017   TRIG 70 05/28/2017     GEN- The patient is well appearing, alert and oriented x 3 today.   Head- normocephalic, atraumatic Eyes-  Sclera clear, conjunctiva pink Ears- hearing intact Oropharynx- clear Neck- supple, no JVP Lymph- no cervical lymphadenopathy Lungs- Clear to ausculation bilaterally, normal work of breathing Heart-regular fast rate and rhythm, no murmurs, rubs or gallops, PMI not laterally displaced GI- soft, NT, ND, + BS Extremities- no clubbing, cyanosis, or edema MS- no significant deformity or atrophy Skin- no rash or lesion Psych- euthymic mood, full affect Neuro- strength and sensation are intact  EKG- Atrial  flutter with variable AV block with PVC's, RBBB  Epic records reviewed   Assessment and Plan: 1.  Paroxysmal afib Recent return to atrial flutter,  successful cardioversion 7 months ago Failed cardioversion but did return  to SR 3 days later Pt did not tolerate amiodarone in the past and has been offered tikosyn, sotalol or repeat ablation when last seen by Dr. Rayann Heman in 2016. His qt Is on long side and may pose a problem with tikosyn  He is taking xarelto 20 mg a day for chadsvasc score of at least 4   I will get an appointment to discuss further with Dr. Rayann Heman options for maintaining SR   [name], [facility] [address] [place], [place] [address] [phone]

## 2018-04-21 ENCOUNTER — Telehealth: Payer: Self-pay | Admitting: Family Medicine

## 2018-04-21 DIAGNOSIS — Z7901 Long term (current) use of anticoagulants: Secondary | ICD-10-CM

## 2018-04-21 DIAGNOSIS — R7303 Prediabetes: Secondary | ICD-10-CM

## 2018-04-21 DIAGNOSIS — I480 Paroxysmal atrial fibrillation: Secondary | ICD-10-CM

## 2018-04-21 DIAGNOSIS — I2510 Atherosclerotic heart disease of native coronary artery without angina pectoris: Secondary | ICD-10-CM

## 2018-04-21 DIAGNOSIS — N183 Chronic kidney disease, stage 3 (moderate): Secondary | ICD-10-CM

## 2018-04-21 DIAGNOSIS — E7800 Pure hypercholesterolemia, unspecified: Secondary | ICD-10-CM

## 2018-04-21 DIAGNOSIS — I2583 Coronary atherosclerosis due to lipid rich plaque: Secondary | ICD-10-CM

## 2018-04-21 DIAGNOSIS — N1831 Chronic kidney disease, stage 3a: Secondary | ICD-10-CM

## 2018-04-21 DIAGNOSIS — I739 Peripheral vascular disease, unspecified: Secondary | ICD-10-CM

## 2018-04-21 DIAGNOSIS — I10 Essential (primary) hypertension: Secondary | ICD-10-CM

## 2018-04-21 NOTE — Telephone Encounter (Signed)
Please leave orders downstairs for any blood work needed before the appt. such as CBC, A1C, Lipids, BMP, etc.  Patient has an appointment for Physical on October 9th.

## 2018-04-21 NOTE — Telephone Encounter (Signed)
Labs ordered.  Labs to be done fasting at least one day prior to physical.  Recommend getting it done in late September early October.

## 2018-04-22 NOTE — Telephone Encounter (Signed)
Patient has been advised. Alton Tremblay,CMA

## 2018-04-26 ENCOUNTER — Other Ambulatory Visit: Payer: Self-pay | Admitting: Family Medicine

## 2018-05-02 DIAGNOSIS — I519 Heart disease, unspecified: Secondary | ICD-10-CM

## 2018-05-02 HISTORY — DX: Heart disease, unspecified: I51.9

## 2018-05-05 ENCOUNTER — Ambulatory Visit: Payer: Medicare Other | Admitting: Internal Medicine

## 2018-05-05 ENCOUNTER — Encounter: Payer: Self-pay | Admitting: Internal Medicine

## 2018-05-05 VITALS — BP 116/64 | HR 65 | Ht 73.0 in | Wt 225.4 lb

## 2018-05-05 DIAGNOSIS — I480 Paroxysmal atrial fibrillation: Secondary | ICD-10-CM

## 2018-05-05 DIAGNOSIS — Z951 Presence of aortocoronary bypass graft: Secondary | ICD-10-CM | POA: Diagnosis not present

## 2018-05-05 DIAGNOSIS — I4892 Unspecified atrial flutter: Secondary | ICD-10-CM | POA: Diagnosis not present

## 2018-05-05 DIAGNOSIS — I10 Essential (primary) hypertension: Secondary | ICD-10-CM | POA: Diagnosis not present

## 2018-05-05 NOTE — Patient Instructions (Addendum)
Medication Instructions:  Your physician recommends that you continue on your current medications as directed. Please refer to the Current Medication list given to you today.  Labwork: None ordered.  Testing/Procedures: Your physician has requested that you have an echocardiogram. Echocardiography is a painless test that uses sound waves to create images of your heart. It provides your doctor with information about the size and shape of your heart and how well your heart's chambers and valves are working. This procedure takes approximately one hour. There are no restrictions for this procedure.   Follow-Up: Your physician recommends that you schedule a follow-up appointment with Dr Rayann Heman as needed  Please keep your follow up appointments with Dr Alvester Chou and Roderic Palau as scheduled.   You may schedule your follow up with Dr Alvester Chou on the way out :)  Any Other Special Instructions Will Be Listed Below (If Applicable).     If you need a refill on your cardiac medications before your next appointment, please call your pharmacy.

## 2018-05-05 NOTE — Progress Notes (Signed)
Electrophysiology Office Note   Date:  05/05/2018   ID:  [name], DOB 1946/05/21, MRN [id number]  PCP:  Gregor Hams, MD  Cardiologist:  Dr Gwenlyn Found Primary Electrophysiologist: Thompson Grayer, MD    CC: afib   History of Present Illness: [name] is a 72 y.o. male who presents today for electrophysiology evaluation.   He is referred by Dr Gwenlyn Found and Roderic Palau NP for EP evaluation of his afib.  I saw him 04/2015 (my note reviewed personally) but have not seen him since.  He is s/p afib ablation x 2 at [facility] (2011, 2012).  The procedure in 3244 was complicated by pseudoaneurysm.  When I saw him in 2016, he opted for medical management with multaq.  He did well for 3 years. He has had 2 episodes of afib since that time (once this past October and once in July).  He has required cardioversion for both.  He is unaware of triggers/ precipitants.  When in afib, he has symptoms of fatigue and decreased exercise tolerance.   He presented this past July with atypical atrial flutter.  He underwent cardioversion which was unsuccessful.  3 days later, he returned to sinus rhythm.  Today, he denies symptoms of palpitations, chest pain, shortness of breath, orthopnea, PND, lower extremity edema, claudication, dizziness, presyncope, syncope, bleeding, or neurologic sequela. The patient is tolerating medications without difficulties and is otherwise without complaint today.    Past Medical History:  Diagnosis Date  . Atrial fibrillation ([place])   . Coronary artery disease   . HLD (hyperlipidemia)   . HTN (hypertension)   . OSA (obstructive sleep apnea)    uses CPAP  . Peripheral artery disease ([place])    pseudoaneurysm post afib ablation at [facility] 2011, s/p bilateral iliac stents  . Pre-diabetes   . Renal artery stenosis ([facility])    right renal artery PTA and stenting  . S/P coronary artery bypass graft x 7 11/16/96   Past Surgical History:  Procedure Laterality Date  . 2-D echocardiogram   03/25/2010   Normal left ventricular function. Mild MR, TR, trivial AR  . ATRIAL FIBRILLATION ABLATION  03/27/2010, 12/31/2010   [facility], Dr. Nadeen Landau  . BACK SURGERY  2007  . cardiac stress test  11/21/2009   Exercise capacity 5 METS. No significant ischemia demonstrated  . CARDIOVERSION N/A 03/15/2015   Procedure: CARDIOVERSION;  Surgeon: Lorretta Harp, MD;  Location: [facility] ENDOSCOPY;  Service: Cardiovascular;  Laterality: N/A;  . CARDIOVERSION N/A 07/10/2017   Procedure: CARDIOVERSION;  Surgeon: Pixie Casino, MD;  Location: [facility] ENDOSCOPY;  Service: Cardiovascular;  Laterality: N/A;  . CARDIOVERSION N/A 03/23/2018   Procedure: CARDIOVERSION;  Surgeon: Sanda Klein, MD;  Location: [facility] ENDOSCOPY;  Service: Cardiovascular;  Laterality: N/A;  . CORONARY ARTERY BYPASS GRAFT  1998  . ORCHIECTOMY  1981   for cancer  . TOOTH EXTRACTION  05/27/13   tooth extraction with bone graft     Current Outpatient Medications  Medication Sig Dispense Refill  . acetaminophen (TYLENOL) 500 MG tablet Take 1,000 mg every 6 (six) hours as needed by mouth (for pain.).    [name] atorvastatin (LIPITOR) 10 MG tablet TAKE 1 TABLET(10 MG) BY MOUTH DAILY 90 tablet 3  . cetirizine (ZYRTEC) 10 MG tablet Take 10 mg by mouth daily.    . furosemide (LASIX) 40 MG tablet Take 1 tablet (40 mg total) by mouth as needed. 30 tablet 0  . gabapentin (NEURONTIN) 300 MG capsule TAKE 2  CAPSULES(600 MG) BY MOUTH AT BEDTIME 180 capsule 0  . lisinopril-hydrochlorothiazide (PRINZIDE,ZESTORETIC) 10-12.5 MG tablet TAKE 1 TABLET BY MOUTH DAILY 90 tablet 2  . metoprolol succinate (TOPROL-XL) 100 MG 24 hr tablet TAKE 1 TABLET(100 MG) BY MOUTH DAILY 90 tablet 3  . MULTAQ 400 MG tablet TAKE 1 TABLET BY MOUTH TWICE DAILY WITH MEAL 180 tablet 2  . Multiple Vitamin (MULTIVITAMIN WITH MINERALS) TABS tablet Take 1 tablet by mouth every other day.     . niacin (NIASPAN) 1000 MG CR tablet Take 1 tablet (1,000 mg total) at bedtime by mouth. 90 tablet 3  .  NON FORMULARY CPAP at night    . traMADol (ULTRAM) 50 MG tablet TAKE 1 TABLET BY MOUTH EVERY 12 HOURS AS NEEDED 30 tablet 0  . XARELTO 20 MG TABS tablet TAKE 1 TABLET BY MOUTH EVERY DAY WITH SUPPER 90 tablet 1   No current facility-administered medications for this visit.     Allergies:   Patient has no known allergies.   Social History:  The patient  reports that he quit smoking about 23 years ago. He has never used smokeless tobacco. He reports that he drinks about 1.0 - 2.0 standard drinks of alcohol per week. He reports that he does not use drugs.   Family History:  The patient's  family history includes Cancer in his father and mother; Leukemia (age of onset: 76) in his daughter; Other in his brother and son.    ROS:  Please see the history of present illness.   All other systems are personally reviewed and negative.    PHYSICAL EXAM: VS:  BP 116/64   Pulse 65   Ht 6\' 1"  (1.854 m)   Wt 225 lb 6.4 oz (102.2 kg)   SpO2 95%   BMI 29.74 kg/m  , BMI Body mass index is 29.74 kg/m.  GEN: Well nourished, well developed, in no acute distress  HEENT: normal  Neck: no JVD, carotid bruits, or masses Cardiac: RRR; no murmurs, rubs, or gallops,no edema  Respiratory:  clear to auscultation bilaterally, normal work of breathing GI: soft, nontender, nondistended, + BS MS: no deformity or atrophy  Skin: warm and dry  Neuro:  Strength and sensation are intact Psych: euthymic mood, full affect  EKG:  EKG is ordered today. The ekg ordered today is personally reviewed and shows sinus rhythm 65 bpm, with PACs, RBBB, Qtc 497 msec   Recent Labs: 05/28/2017: ALT 13 06/30/2017: TSH 1.185 03/23/2018: BUN 9; Creatinine, Ser 1.24; Hemoglobin 15.0; Platelets 184; Potassium 4.0; Sodium 137  personally reviewed   Lipid Panel     Component Value Date/Time   CHOL 108 05/28/2017 0858   TRIG 70 05/28/2017 0858   HDL 52 05/28/2017 0858   CHOLHDL 2.1 05/28/2017 0858   VLDL 14 03/18/2016 0822    LDLCALC 41 05/28/2017 0858   personally reviewed   Wt Readings from Last 3 Encounters:  05/05/18 225 lb 6.4 oz (102.2 kg)  03/30/18 226 lb (102.5 kg)  03/23/18 223 lb (101.2 kg)      Other studies personally reviewed: Additional studies/ records that were reviewed today include: AF clinic notes, Dr Rachel Bo notes, prior echo  Review of the above records today demonstrates: echo 04/17/15 reveals EF 50-55%, mild biatrial enlargement   ASSESSMENT AND PLAN:  1.  Atypical atrial flutter and persistent afib S/p PVI by Dr Lurene Shadow at [facility] 2011, 2012 The patient has symptomatic, recurrent persistent atrial fibrillation and atrial flutter. he has failed  medical therapy with multaq and amiodarone.  Qt is marginal for sotalol or tikosyn Chads2vasc score is 4.  he is anticoagulated with xarelto . Therapeutic strategies for afib including medicine and repeat ablation were discussed in detail with the patient today. Risk, benefits, and alternatives to EP study and radiofrequency ablation for afib were also discussed in detail today. At this time, he would prefer to continue multaq.  I would not advise any medicine change at this time.  If he has additional afib, he may be more willing to consider ablation. Update echo to evaluate for structural changes related to his AF.  2. OSA Compliant with CPAP  Follow-up:  With Dr Gwenlyn Found and AF clinic as scheduled I will see when needed  Current medicines are reviewed at length with the patient today.   The patient does not have concerns regarding his medicines.  The following changes were made today:  none   Signed, Thompson Grayer, MD  05/05/2018 9:34 AM     [facility] [address] [place] [address] [phone] (office) [phone] (fax)

## 2018-05-13 ENCOUNTER — Other Ambulatory Visit: Payer: Self-pay

## 2018-05-13 ENCOUNTER — Ambulatory Visit (HOSPITAL_COMMUNITY): Payer: Medicare Other | Attending: Internal Medicine

## 2018-05-13 DIAGNOSIS — N184 Chronic kidney disease, stage 4 (severe): Secondary | ICD-10-CM | POA: Insufficient documentation

## 2018-05-13 DIAGNOSIS — I071 Rheumatic tricuspid insufficiency: Secondary | ICD-10-CM | POA: Insufficient documentation

## 2018-05-13 DIAGNOSIS — I480 Paroxysmal atrial fibrillation: Secondary | ICD-10-CM | POA: Insufficient documentation

## 2018-05-13 MED ORDER — PERFLUTREN LIPID MICROSPHERE
1.0000 mL | INTRAVENOUS | Status: AC | PRN
  Administered 2018-05-13: 2 mL via INTRAVENOUS

## 2018-05-22 ENCOUNTER — Other Ambulatory Visit: Payer: Self-pay | Admitting: Cardiovascular Disease

## 2018-05-27 DIAGNOSIS — I2583 Coronary atherosclerosis due to lipid rich plaque: Secondary | ICD-10-CM | POA: Diagnosis not present

## 2018-05-27 DIAGNOSIS — E7800 Pure hypercholesterolemia, unspecified: Secondary | ICD-10-CM | POA: Diagnosis not present

## 2018-05-27 DIAGNOSIS — I10 Essential (primary) hypertension: Secondary | ICD-10-CM | POA: Diagnosis not present

## 2018-05-27 DIAGNOSIS — I2510 Atherosclerotic heart disease of native coronary artery without angina pectoris: Secondary | ICD-10-CM | POA: Diagnosis not present

## 2018-05-28 ENCOUNTER — Encounter: Payer: Self-pay | Admitting: Family Medicine

## 2018-05-28 LAB — LIPID PANEL W/REFLEX DIRECT LDL
Cholesterol: 99 mg/dL (ref <200)
HDL: 44 mg/dL (ref >40)
LDL Cholesterol (Calc): 40 mg/dL (calc)
Non-HDL Cholesterol (Calc): 55 mg/dL (calc) (ref <130)
Total CHOL/HDL Ratio: 2.3 (calc) (ref <5.0)
Triglycerides: 71 mg/dL (ref <150)

## 2018-05-28 LAB — COMPLETE METABOLIC PANEL WITH GFR
AG Ratio: 1.6 (calc) (ref 1.0–2.5)
ALT: 13 U/L (ref 9–46)
AST: 22 U/L (ref 10–35)
Albumin: 4.2 g/dL (ref 3.6–5.1)
Alkaline phosphatase (APISO): 96 U/L (ref 40–115)
BUN/Creatinine Ratio: 13 (calc) (ref 6–22)
BUN: 17 mg/dL (ref 7–25)
CO2: 31 mmol/L (ref 20–32)
Calcium: 9.5 mg/dL (ref 8.6–10.3)
Chloride: 100 mmol/L (ref 98–110)
Creat: 1.33 mg/dL — ABNORMAL HIGH (ref 0.70–1.18)
GFR, Est African American: 62 mL/min/{1.73_m2} (ref >=60)
GFR, Est Non African American: 54 mL/min/{1.73_m2} — ABNORMAL LOW (ref >=60)
Globulin: 2.6 g/dL (calc) (ref 1.9–3.7)
Glucose, Bld: 105 mg/dL (ref 65–139)
Potassium: 4.2 mmol/L (ref 3.5–5.3)
Sodium: 138 mmol/L (ref 135–146)
Total Bilirubin: 0.8 mg/dL (ref 0.2–1.2)
Total Protein: 6.8 g/dL (ref 6.1–8.1)

## 2018-05-28 LAB — CBC
HCT: 42 % (ref 38.5–50.0)
Hemoglobin: 14.2 g/dL (ref 13.2–17.1)
MCH: 30.9 pg (ref 27.0–33.0)
MCHC: 33.8 g/dL (ref 32.0–36.0)
MCV: 91.5 fL (ref 80.0–100.0)
MPV: 10.7 fL (ref 7.5–12.5)
Platelets: 207 10*3/uL (ref 140–400)
RBC: 4.59 10*6/uL (ref 4.20–5.80)
RDW: 14.2 % (ref 11.0–15.0)
WBC: 6.3 10*3/uL (ref 3.8–10.8)

## 2018-05-28 LAB — HEMOGLOBIN A1C
Hgb A1c MFr Bld: 6.6 % of total Hgb — ABNORMAL HIGH (ref <5.7)
Mean Plasma Glucose: 143 (calc)
eAG (mmol/L): 7.9 (calc)

## 2018-06-07 ENCOUNTER — Other Ambulatory Visit: Payer: Self-pay | Admitting: Family Medicine

## 2018-06-09 ENCOUNTER — Ambulatory Visit (INDEPENDENT_AMBULATORY_CARE_PROVIDER_SITE_OTHER): Payer: Medicare Other | Admitting: Family Medicine

## 2018-06-09 ENCOUNTER — Encounter: Payer: Self-pay | Admitting: Family Medicine

## 2018-06-09 VITALS — BP 118/63 | HR 62 | Ht 73.0 in | Wt 225.0 lb

## 2018-06-09 DIAGNOSIS — I519 Heart disease, unspecified: Secondary | ICD-10-CM

## 2018-06-09 DIAGNOSIS — Z6829 Body mass index (BMI) 29.0-29.9, adult: Secondary | ICD-10-CM | POA: Diagnosis not present

## 2018-06-09 DIAGNOSIS — Z23 Encounter for immunization: Secondary | ICD-10-CM | POA: Diagnosis not present

## 2018-06-09 DIAGNOSIS — N1831 Chronic kidney disease, stage 3a: Secondary | ICD-10-CM

## 2018-06-09 DIAGNOSIS — E1142 Type 2 diabetes mellitus with diabetic polyneuropathy: Secondary | ICD-10-CM

## 2018-06-09 DIAGNOSIS — E1122 Type 2 diabetes mellitus with diabetic chronic kidney disease: Secondary | ICD-10-CM

## 2018-06-09 DIAGNOSIS — Z7901 Long term (current) use of anticoagulants: Secondary | ICD-10-CM

## 2018-06-09 DIAGNOSIS — N183 Chronic kidney disease, stage 3 (moderate): Secondary | ICD-10-CM

## 2018-06-09 DIAGNOSIS — I480 Paroxysmal atrial fibrillation: Secondary | ICD-10-CM

## 2018-06-09 DIAGNOSIS — Z0000 Encounter for general adult medical examination without abnormal findings: Secondary | ICD-10-CM

## 2018-06-09 DIAGNOSIS — Z951 Presence of aortocoronary bypass graft: Secondary | ICD-10-CM

## 2018-06-09 DIAGNOSIS — I272 Pulmonary hypertension, unspecified: Secondary | ICD-10-CM | POA: Diagnosis not present

## 2018-06-09 DIAGNOSIS — I701 Atherosclerosis of renal artery: Secondary | ICD-10-CM

## 2018-06-09 DIAGNOSIS — I739 Peripheral vascular disease, unspecified: Secondary | ICD-10-CM

## 2018-06-09 DIAGNOSIS — E7800 Pure hypercholesterolemia, unspecified: Secondary | ICD-10-CM

## 2018-06-09 NOTE — Progress Notes (Signed)
INFLUE  

## 2018-06-09 NOTE — Patient Instructions (Addendum)
Thank you for coming in today. Continue current medication.  Schedule detailed medicare wellness visit/interview with nurse in the near future.  Recheck with me in 3 months or sooner if needed for diabetes.

## 2018-06-09 NOTE — Progress Notes (Signed)
[name] is a 72 y.o. male who presents to [place]: [place] today for well adult visit.  [name] is doing reasonably well overall.  His main medical issue remains his heart.  He is status post coronary artery bypass grafting and has a history of paroxysmal atrial fibrillation.  He was seen by his cardiologist somewhat recently and had an echocardiogram that showed slight decrease in his ejection fraction with some focal akinesis.  Ejection fraction was 45 to 50%.  Additionally he developed diastolic dysfunction.  He continues to take the medications below and feels pretty well.  He denies significant exertional dyspnea or any significant chest pain.  He had sizes regularly and tries to eat a healthy diet.  He has he is a follow-up appoint with his cardiologist in the near future.   ROS as above:  Past Medical History:  Diagnosis Date  . Atypical atrial flutter ([place])   . Coronary artery disease   . HLD (hyperlipidemia)   . HTN (hypertension)   . OSA (obstructive sleep apnea)    uses CPAP  . Peripheral artery disease ([place])    pseudoaneurysm post afib ablation at [facility] 2011, s/p bilateral iliac stents  . Persistent atrial fibrillation   . Pre-diabetes   . Renal artery stenosis ([facility])    right renal artery PTA and stenting  . S/P coronary artery bypass graft x 7 11/16/96   Past Surgical History:  Procedure Laterality Date  . 2-D echocardiogram  03/25/2010   Normal left ventricular function. Mild MR, TR, trivial AR  . ATRIAL FIBRILLATION ABLATION  03/27/2010, 12/31/2010   [facility], Dr. Nadeen Landau  . BACK SURGERY  2007  . cardiac stress test  11/21/2009   Exercise capacity 5 METS. No significant ischemia demonstrated  . CARDIOVERSION N/A 03/15/2015   Procedure: CARDIOVERSION;  Surgeon: Lorretta Harp, MD;  Location[facility] END[facility]COPY;  Service: Cardiovascular;  Laterality: N/A;  .  CARDIOVERSION N/A 07/10/2017   Procedure: CARDIOVERSION;  Surgeon: Pixie Casino, MD;  Location: [facility] ENDOSCOPY;  Service: Cardiovascular;  Laterality: N/A;  . CARDIOVERSION N/A 03/23/2018   Procedure: CARDIOVERSION;  Surgeon: Sanda Klein, MD;  Location: [facility] ENDOSCOPY;  Service: Cardiovascular;  Laterality: N/A;  . CORONARY ARTERY BYPASS GRAFT  1998  . ORCHIECTOMY  1981   for cancer  . TOOTH EXTRACTION  05/27/13   tooth extraction with bone graft   Social History   Tobacco Use  . Smoking status: Former Smoker    Last attempt to quit: 09/01/1994    Years since quitting: 23.7  . Smokeless tobacco: Never Used  Substance Use Topics  . Alcohol use: Yes    Alcohol/week: 1.0 - 2.0 standard drinks    Types: 1 - 2 Glasses of wine per week    Comment: occasional   family history includes Cancer in his father and mother; Leukemia (age of onset: 12) in his daughter; Other in his brother and son.  Medications: Current Outpatient Medications  Medication Sig Dispense Refill  . acetaminophen (TYLENOL) 500 MG tablet Take 1,000 mg every 6 (six) hours as needed by mouth (for pain.).    [name] atorvastatin (LIPITOR) 10 MG tablet TAKE 1 TABLET(10 MG) BY MOUTH DAILY 90 tablet 3  . cetirizine (ZYRTEC) 10 MG tablet Take 10 mg by mouth daily.    . furosemide (LASIX) 40 MG tablet Take 1 tablet (40 mg total) by mouth as needed. 30 tablet 0  . gabapentin (NEURONTIN)  300 MG capsule TAKE 2 CAPSULES(600 MG) BY MOUTH AT BEDTIME 180 capsule 0  . lisinopril-hydrochlorothiazide (PRINZIDE,ZESTORETIC) 10-12.5 MG tablet TAKE 1 TABLET BY MOUTH DAILY 90 tablet 2  . metoprolol succinate (TOPROL-XL) 100 MG 24 hr tablet TAKE 1 TABLET(100 MG) BY MOUTH DAILY 90 tablet 3  . MULTAQ 400 MG tablet TAKE 1 TABLET BY MOUTH TWICE DAILY WITH MEAL 180 tablet 2  . Multiple Vitamin (MULTIVITAMIN WITH MINERALS) TABS tablet Take 1 tablet by mouth every other day.     . niacin (NIASPAN) 1000 MG CR tablet Take 1 tablet (1,000 mg total) at bedtime  by mouth. 90 tablet 3  . NON FORMULARY CPAP at night    . traMADol (ULTRAM) 50 MG tablet TAKE 1 TABLET BY MOUTH EVERY 12 HOURS AS NEEDED 30 tablet 0  . XARELTO 20 MG TABS tablet TAKE 1 TABLET BY MOUTH EVERY DAY WITH SUPPER 90 tablet 1   No current facility-administered medications for this visit.    No Known Allergies  Health Maintenance Health Maintenance  Topic Date Due  . INFLUENZA VACCINE  04/01/2018  . PNA vac Low Risk Adult (2 of 2 - PPSV23) 06/16/2018 (Originally 10/16/2016)  . OPHTHALMOLOGY EXAM  09/07/2018  . HEMOGLOBIN A1C  11/25/2018  . FOOT EXAM  06/10/2019  . COLONOSCOPY  06/27/2020  . TETANUS/TDAP  07/02/2021  . Hepatitis C Screening  Completed     Exam:  BP 118/63   Pulse 62   Ht '6\' 1"'  (1.854 m)   Wt 225 lb (102.1 kg)   BMI 29.69 kg/m  Wt Readings from Last 5 Encounters:  06/09/18 225 lb (102.1 kg)  05/05/18 225 lb 6.4 oz (102.2 kg)  03/30/18 226 lb (102.5 kg)  03/23/18 223 lb (101.2 kg)  02/23/18 223 lb 1.6 oz (101.2 kg)      Gen: Well NAD HEENT: EOMI,  MMM Lungs: Normal work of breathing. CTABL Heart: RRR no MRG Abd: NABS, Soft. Nondistended, Nontender Exts: Brisk capillary refill, warm and well perfused.  Psych: Alert and oriented normal speech thought process and affect.  Depression screen [facility] 2/9 06/09/2018 06/03/2017  Decreased Interest 0 0  Down, Depressed, Hopeless 0 0  PHQ - 2 Score 0 0       Lab and Radiology Results Recent Results (from the past 2160 hour(s))  CBC     Status: None   Collection Time: 03/23/18 10:19 AM  Result Value Ref Range   WBC 8.3 4.0 - 10.5 K/uL   RBC 4.78 4.22 - 5.81 MIL/uL   Hemoglobin 15.0 13.0 - 17.0 g/dL   HCT 45.6 39.0 - 52.0 %   MCV 95.4 78.0 - 100.0 fL   MCH 31.4 26.0 - 34.0 pg   MCHC 32.9 30.0 - 36.0 g/dL   RDW 14.3 11.5 - 15.5 %   Platelets 184 150 - 400 K/uL    Comment: Performed at [facility], [place] [address] [place], [place] [address]  Basic metabolic panel     Status: Abnormal    Collection Time: 03/23/18 10:19 AM  Result Value Ref Range   Sodium 137 135 - 145 mmol/L   Potassium 4.0 3.5 - 5.1 mmol/L   Chloride 100 98 - 111 mmol/L   CO2 29 22 - 32 mmol/L   Glucose, Bld 139 (H) 70 - 99 mg/dL   BUN 9 8 - 23 mg/dL   Creatinine, Ser 1.24 0.61 - 1.24 mg/dL   Calcium 9.4 8.9 - 10.3 mg/dL   GFR  calc non Af Amer 58 (L) >60 mL/min   GFR calc Af Amer >60 >60 mL/min    Comment: (NOTE) The eGFR has been calculated using the CKD EPI equation. This calculation has not been validated in all clinical situations. eGFR's persistently <60 mL/min signify possible Chronic Kidney Disease.    Anion gap 8 5 - 15    Comment: Performed at [facility] [address], [place] [address]  CBC     Status: None   Collection Time: 05/27/18  9:10 AM  Result Value Ref Range   WBC 6.3 3.8 - 10.8 Thousand/uL   RBC 4.59 4.20 - 5.80 Million/uL   Hemoglobin 14.2 13.2 - 17.1 g/dL   HCT 42.0 38.5 - 50.0 %   MCV 91.5 80.0 - 100.0 fL   MCH 30.9 27.0 - 33.0 pg   MCHC 33.8 32.0 - 36.0 g/dL   RDW 14.2 11.0 - 15.0 %   Platelets 207 140 - 400 Thousand/uL   MPV 10.7 7.5 - 12.5 fL  Hemoglobin A1c     Status: Abnormal   Collection Time: 05/27/18  9:10 AM  Result Value Ref Range   Hgb A1c MFr Bld 6.6 (H) <5.7 % of total Hgb    Comment: For someone without known diabetes, a hemoglobin A1c value of 6.5% or greater indicates that they may have  diabetes and this should be confirmed with a follow-up  test. . For someone with known diabetes, a value <7% indicates  that their diabetes is well controlled and a value  greater than or equal to 7% indicates suboptimal  control. A1c targets should be individualized based on  duration of diabetes, age, comorbid conditions, and  other considerations. . Currently, no consensus exists regarding use of hemoglobin A1c for diagnosis of diabetes for children. .    Mean Plasma Glucose 143 (calc)   eAG (mmol/L) 7.9 (calc)  COMPLETE METABOLIC  PANEL WITH GFR     Status: Abnormal   Collection Time: 05/27/18  9:10 AM  Result Value Ref Range   Glucose, Bld 105 65 - 139 mg/dL    Comment: .        Non-fasting reference interval .    BUN 17 7 - 25 mg/dL   Creat 1.33 (H) 0.70 - 1.18 mg/dL    Comment: For patients >47 years of age, the reference limit for Creatinine is approximately 13% higher for people identified as African-American. .    GFR, Est Non African American 54 (L) > OR = 60 mL/min/1.24m   GFR, Est African American 62 > OR = 60 mL/min/1.751m  BUN/Creatinine Ratio 13 6 - 22 (calc)   Sodium 138 135 - 146 mmol/L   Potassium 4.2 3.5 - 5.3 mmol/L   Chloride 100 98 - 110 mmol/L   CO2 31 20 - 32 mmol/L   Calcium 9.5 8.6 - 10.3 mg/dL   Total Protein 6.8 6.1 - 8.1 g/dL   Albumin 4.2 3.6 - 5.1 g/dL   Globulin 2.6 1.9 - 3.7 g/dL (calc)   AG Ratio 1.6 1.0 - 2.5 (calc)   Total Bilirubin 0.8 0.2 - 1.2 mg/dL   Alkaline phosphatase (APISO) 96 40 - 115 U/L   AST 22 10 - 35 U/L   ALT 13 9 - 46 U/L  Lipid Panel w/reflex Direct LDL     Status: None   Collection Time: 05/27/18  9:10 AM  Result Value Ref Range   Cholesterol 99 <200 mg/dL  HDL 44 >40 mg/dL   Triglycerides 71 <150 mg/dL   LDL Cholesterol (Calc) 40 mg/dL (calc)    Comment: Reference range: <100 . Desirable range <100 mg/dL for primary prevention;   <70 mg/dL for patients with CHD or diabetic patients  with > or = 2 CHD risk factors. [name] LDL-C is now calculated using the Martin-Hopkins  calculation, which is a validated novel method providing  better accuracy than the Friedewald equation in the  estimation of LDL-C.  Cresenciano Genre et al. Annamaria Helling. 4299;806(99): 2061-2068  (http://education.[employer].com/faq/FAQ164)    Total CHOL/HDL Ratio 2.3 <5.0 (calc)   Non-HDL Cholesterol (Calc) 55 <130 mg/dL (calc)    Comment: For patients with diabetes plus 1 major ASCVD risk  factor, treating to a non-HDL-C goal of <100 mg/dL  (LDL-C of <70 mg/dL) is considered a  therapeutic  option.       Assessment and Plan: 72 y.o. male with  Well adult visit.  Doing reasonably well.  He remains physically active and relatively fit despite his medical problems.  He had recent laboratory work-up in preparation for today's wellness visit which was largely stable.  A1c did increase slightly to 6.6.  He continues to eat a careful low carbohydrate diet.  Plan to continue current regimen and recheck in 3 months.  She will schedule with nurse visit for detailed Medicare wellness interview/visit.  Flu vaccine given today.  Patient would like to delay the Pneumovax pneumonia 23 vaccine that is due for until the next visit.   Orders Placed This Encounter  Procedures  . Flu Vaccine QUAD 6+ mos PF IM (Fluarix Quad PF)   No orders of the defined types were placed in this encounter.    Discussed warning signs or symptoms. Please see discharge instructions. Patient expresses understanding.

## 2018-06-18 DIAGNOSIS — G4733 Obstructive sleep apnea (adult) (pediatric): Secondary | ICD-10-CM | POA: Diagnosis not present

## 2018-06-23 ENCOUNTER — Other Ambulatory Visit: Payer: Self-pay | Admitting: *Deleted

## 2018-06-23 DIAGNOSIS — I480 Paroxysmal atrial fibrillation: Secondary | ICD-10-CM

## 2018-06-23 MED ORDER — RIVAROXABAN 20 MG PO TABS: ORAL_TABLET | ORAL | 2 refills | Status: DC

## 2018-06-23 NOTE — Telephone Encounter (Signed)
Xarelto 20mg  refill request received; pt is 72 yrs old, wt-102.1kg, Cr-1.33 on 05/27/18, last seen by Dr. Rayann Heman on 05/05/18, CrCl-74.82ml/min; will send in refill to requested pharmacy.

## 2018-06-24 ENCOUNTER — Other Ambulatory Visit: Payer: Self-pay | Admitting: Cardiovascular Disease

## 2018-06-24 ENCOUNTER — Other Ambulatory Visit: Payer: Self-pay | Admitting: Nurse Practitioner

## 2018-06-24 DIAGNOSIS — I480 Paroxysmal atrial fibrillation: Secondary | ICD-10-CM

## 2018-07-02 ENCOUNTER — Ambulatory Visit (HOSPITAL_COMMUNITY)
Admission: RE | Admit: 2018-07-02 | Discharge: 2018-07-02 | Disposition: A | Discharge status: Home / Self Care | Payer: Medicare Other | Source: Ambulatory Visit | Attending: Cardiovascular Disease | Admitting: Cardiovascular Disease

## 2018-07-02 ENCOUNTER — Other Ambulatory Visit: Payer: Self-pay | Admitting: Cardiovascular Disease

## 2018-07-02 ENCOUNTER — Ambulatory Visit (HOSPITAL_BASED_OUTPATIENT_CLINIC_OR_DEPARTMENT_OTHER)
Admission: RE | Admit: 2018-07-02 | Discharge: 2018-07-02 | Disposition: A | Discharge status: Home / Self Care | Payer: Medicare Other | Source: Ambulatory Visit | Attending: Cardiovascular Disease | Admitting: Cardiovascular Disease

## 2018-07-02 DIAGNOSIS — I739 Peripheral vascular disease, unspecified: Secondary | ICD-10-CM

## 2018-07-02 DIAGNOSIS — Z95820 Peripheral vascular angioplasty status with implants and grafts: Secondary | ICD-10-CM

## 2018-07-20 ENCOUNTER — Encounter: Payer: Self-pay | Admitting: Cardiovascular Disease

## 2018-07-20 ENCOUNTER — Ambulatory Visit: Payer: Medicare Other | Admitting: Cardiovascular Disease

## 2018-07-20 VITALS — BP 112/60 | HR 67 | Ht 73.0 in | Wt 225.6 lb

## 2018-07-20 DIAGNOSIS — E7800 Pure hypercholesterolemia, unspecified: Secondary | ICD-10-CM | POA: Diagnosis not present

## 2018-07-20 DIAGNOSIS — I10 Essential (primary) hypertension: Secondary | ICD-10-CM

## 2018-07-20 DIAGNOSIS — I739 Peripheral vascular disease, unspecified: Secondary | ICD-10-CM | POA: Diagnosis not present

## 2018-07-20 DIAGNOSIS — I480 Paroxysmal atrial fibrillation: Secondary | ICD-10-CM

## 2018-07-20 NOTE — Progress Notes (Signed)
07/20/2018 [name]   05-Feb-1946  [id number]  Primary Physician Gregor Hams, MD Primary Cardiologist: Lorretta Harp MD FACP, [place], [place], [place]  HPI:  [name] is a 72 y.o.  moderately overweight, divorced, Caucasian male father of 2, grandfather of 1 grandchild who I saw  06/17/2017.  He has a history of CAD status post coronary artery bypass grafting x7 November 16, 1996. His other problems include PVOD status post bilateral iliac artery PTA and stenting by myself remotely with restenting in 2007. He has had right renal artery PTA and stenting as well. His other problems include hypertension, hyperlipidemia, non-insulin-requiring diabetes. He does have paroxysmal atrial fibrillation and has undergone multiple DC cardioversions as well as atrial fibrillation ablations by Dr. Samara Deist at [facility], most recently in May of last year, though he is now back in atrial fibrillation/flutter on a daily basis, which he is aware of but not symptomatic from. He is active and works out on a treadmill every day. He denies chest pain or shortness of breath. His last stress test performed one year ago which revealed an inferolateral scar without ischemia.  Since I saw him at 9 months ago has remained clinically stable. He denies chest pain, shortness of breath or claudication. He works out on a treadmill or plays golf daily. He is a Myoview stress test performed in October 2014 that showed inferolateral scar without ischemia unchanged from prior studies. Recent arterial Doppler studies of his lower extremities revealed ABIs in the 0.9 range bilaterally with patent iliac stents.he's noticed tachycardia over the last several months which on EKG yesterday is suggested A. Fib with RVR. He has been on Coumadin anticoagulation and has been therapeutic. I performed outpatient DC cardioversion on him 03/15/15 successfully back to sinus rhythm. He saw Dr. Rayann Heman subsequent to that and was placed on Multaq  antiarrhythmic therapy. His Coumadin was switched to Xarelto .   Since I saw him a year ago he has had cardioversion twice by Dr. Debara Pickett and Croitoru.  He saw Dr. Rayann Heman in the office 2 months ago and was in sinus rhythm.  He denies chest pain or shortness of breath.  He has noticed some mild left lower extremity claudication and Dopplers suggested progression of disease on that side.    Current Meds  Medication Sig  . acetaminophen (TYLENOL) 500 MG tablet Take 1,000 mg every 6 (six) hours as needed by mouth (for pain.).  [name] atorvastatin (LIPITOR) 10 MG tablet TAKE 1 TABLET(10 MG) BY MOUTH DAILY  . cetirizine (ZYRTEC) 10 MG tablet Take 10 mg by mouth daily.  . furosemide (LASIX) 40 MG tablet Take 1 tablet (40 mg total) by mouth as needed.  . gabapentin (NEURONTIN) 300 MG capsule TAKE 2 CAPSULES(600 MG) BY MOUTH AT BEDTIME  . lisinopril-hydrochlorothiazide (PRINZIDE,ZESTORETIC) 10-12.5 MG tablet TAKE 1 TABLET BY MOUTH DAILY  . metoprolol succinate (TOPROL-XL) 100 MG 24 hr tablet TAKE 1 TABLET(100 MG) BY MOUTH DAILY  . MULTAQ 400 MG tablet TAKE 1 TABLET BY MOUTH TWICE DAILY WITH MEAL  . Multiple Vitamin (MULTIVITAMIN WITH MINERALS) TABS tablet Take 1 tablet by mouth every other day.   . niacin (NIASPAN) 1000 MG CR tablet TAKE 1 TABLET(1000 MG) BY MOUTH AT BEDTIME  . NON FORMULARY CPAP at night  . rivaroxaban (XARELTO) 20 MG TABS tablet TAKE 1 TABLET BY MOUTH EVERY DAY WITH SUPPER  . traMADol (ULTRAM) 50 MG tablet TAKE 1 TABLET BY MOUTH EVERY 12 HOURS  AS NEEDED     No Known Allergies  Social History   Socioeconomic History  . Marital status: Divorced    Spouse name: Not on file  . Number of children: Not on file  . Years of education: Not on file  . Highest education level: Not on file  Occupational History  . Not on file  Social Needs  . Financial resource strain: Not on file  . Food insecurity:    Worry: Not on file    Inability: Not on file  . Transportation needs:    Medical:  Not on file    Non-medical: Not on file  Tobacco Use  . Smoking status: Former Smoker    Last attempt to quit: 09/01/1994    Years since quitting: 23.8  . Smokeless tobacco: Never Used  Substance and Sexual Activity  . Alcohol use: Yes    Alcohol/week: 1.0 - 2.0 standard drinks    Types: 1 - 2 Glasses of wine per week    Comment: occasional  . Drug use: No  . Sexual activity: Not on file  Lifestyle  . Physical activity:    Days per week: 5 days    Minutes per session: 20 min  . Stress: Not on file  Relationships  . Social connections:    Talks on phone: Not on file    Gets together: Not on file    Attends religious service: Not on file    Active member of club or organization: Not on file    Attends meetings of clubs or organizations: Not on file    Relationship status: Not on file  . Intimate partner violence:    Fear of current or ex partner: Not on file    Emotionally abused: Not on file    Physically abused: Not on file    Forced sexual activity: Not on file  Other Topics Concern  . Not on file  Social History Narrative   Pt lives in [place] alone.  Divorced.   Retired from [employer])     Review of Systems: General: negative for chills, fever, night sweats or weight changes.  Cardiovascular: negative for chest pain, dyspnea on exertion, edema, orthopnea, palpitations, paroxysmal nocturnal dyspnea or shortness of breath Dermatological: negative for rash Respiratory: negative for cough or wheezing Urologic: negative for hematuria Abdominal: negative for nausea, vomiting, diarrhea, bright red blood per rectum, melena, or hematemesis Neurologic: negative for visual changes, syncope, or dizziness All other systems reviewed and are otherwise negative except as noted above.    Blood pressure 112/60, pulse 67, height 6\' 1"  (1.854 m), weight 225 lb 9.6 oz (102.3 kg), SpO2 93 %.  General appearance: alert and icteric Neck: no adenopathy, no carotid  bruit, no JVD, supple, symmetrical, trachea midline and thyroid not enlarged, symmetric, no tenderness/mass/nodules Lungs: clear to auscultation bilaterally Heart: regular rate and rhythm, S1, S2 normal, no murmur, click, rub or gallop Extremities: extremities normal, atraumatic, no cyanosis or edema Pulses: 2+ and symmetric Skin: Skin color, texture, turgor normal. No rashes or lesions Neurologic: Alert and oriented X 3, normal strength and tone. Normal symmetric reflexes. Normal coordination and gait  EKG not performed today  ASSESSMENT AND PLAN:   Paroxysmal atrial fibrillation History of PAF status post ablation x2 at [facility] 2011 2012.  She had DC cardioversion this past year by Dr. Debara Pickett and Croitoru successfully to sinus rhythm.  He is on Xarelto oral anticoagulation.  He saw Dr. Rayann Heman back  in September.  He is currently in sinus rhythm although he knows when he goes in A. fib.  Status post coronary artery bypass grafting x 7, 11/16/1996 History of CAD status post bypass grafting times 73/18/98.  His last Myoview performed October 2014 showed inferolateral scar without ischemia.  Peripheral artery disease History of peripheral arterial disease status post bilateral iliac PTA and stenting by myself in 2007 as well as right renal artery stenting as well.  Recent Dopplers performed several months ago suggested progression of disease on the left.  Does have mild lifestyle limiting claudication on that side although he wishes to delay into intervention currently.  We will continue to follow him noninvasively.  Hyperlipidemia History of hyperlipidemia on statin therapy with lipid profile performed 05/27/2018 revealing total cholesterol 99, LDL 40 and HDL 44.  BP (high blood pressure) History of essential hypertension her blood pressure measured today 112/60.  He is on lisinopril, hydrochlorothiazide and metoprolol.      Lorretta Harp MD FACP,FACC,FAHA,  [facility] 07/20/2018 11:03 AM

## 2018-07-20 NOTE — Assessment & Plan Note (Signed)
History of hyperlipidemia on statin therapy with lipid profile performed 05/27/2018 revealing total cholesterol 99, LDL 40 and HDL 44.

## 2018-07-20 NOTE — Assessment & Plan Note (Signed)
History of peripheral arterial disease status post bilateral iliac PTA and stenting by myself in 2007 as well as right renal artery stenting as well.  Recent Dopplers performed several months ago suggested progression of disease on the left.  Does have mild lifestyle limiting claudication on that side although he wishes to delay into intervention currently.  We will continue to follow him noninvasively.

## 2018-07-20 NOTE — Patient Instructions (Signed)
Medication Instructions:  Your physician recommends that you continue on your current medications as directed. Please refer to the Current Medication list given to you today.  If you need a refill on your cardiac medications before your next appointment, please call your pharmacy.   Lab work: NONE If you have labs (blood work) drawn today and your tests are completely normal, you will receive your results only by: [name] MyChart Message (if you have MyChart) OR . A paper copy in the mail If you have any lab test that is abnormal or we need to change your treatment, we will call you to review the results.  Testing/Procedures: Your physician has requested that you have an aorta/iliac duplex. During this test, an ultrasound is used to evaluate blood flow to the aorta and iliac arteries. Allow one hour for this exam. Do not eat after midnight the day before and avoid carbonated beverages.   Your physician has requested that you have an ankle brachial index (ABI). During this test an ultrasound and blood pressure cuff are used to evaluate the arteries that supply the arms and legs with blood. Allow thirty minutes for this exam. There are no restrictions or special instructions.  SCHEDULE FOR NOVEMBER 2020  Follow-Up: At [facility], you and your health needs are our priority.  As part of our continuing mission to provide you with exceptional heart care, we have created designated Provider Care Teams.  These Care Teams include your primary Cardiologist (physician) and Advanced Practice Providers (APPs -  Physician Assistants and Nurse Practitioners) who all work together to provide you with the care you need, when you need it. You will need a follow up appointment in 12 months.  Please call our office 2 months in advance to schedule this appointment.  You may see  DR. BERRY or one of the following Advanced Practice Providers on your designated Care Team:   Kerin Ransom, PA-C [place],  [place] . Sande Rives, PA-C  Any Other Special Instructions Will Be Listed Below (If Applicable).

## 2018-07-20 NOTE — Assessment & Plan Note (Signed)
History of PAF status post ablation x2 at [facility] 2011 2012.  She had DC cardioversion this past year by Dr. Debara Pickett and Croitoru successfully to sinus rhythm.  He is on Xarelto oral anticoagulation.  He saw Dr. Rayann Heman back in September.  He is currently in sinus rhythm although he knows when he goes in A. fib.

## 2018-07-20 NOTE — Assessment & Plan Note (Signed)
History of CAD status post bypass grafting times 73/18/98.  His last Myoview performed October 2014 showed inferolateral scar without ischemia.

## 2018-07-20 NOTE — Assessment & Plan Note (Signed)
History of essential hypertension her blood pressure measured today 112/60.  He is on lisinopril, hydrochlorothiazide and metoprolol.

## 2018-07-23 ENCOUNTER — Other Ambulatory Visit: Payer: Self-pay | Admitting: Family Medicine

## 2018-09-08 NOTE — Progress Notes (Signed)
Subjective:   [name] is a 73 y.o. male who presents for Medicare Annual/Subsequent preventive examination.  Review of Systems:  No ROS.  Medicare Wellness Visit. Additional risk factors are reflected in the social history.  Cardiac Risk Factors include: hypertension;sedentary lifestyle;diabetes mellitus;dyslipidemia;male gender  Sleep patterns: Getting 6-7 hours of sleep at the time. Trouble faliing asleep. Does not wake up in the might to use the bathrrom. [place] feeling struggling Home Safety/Smoke Alarms: Feels safe in home. Smoke alarms in place.  Living environment;Lives alone in in 1 story home. No staris. SHower is a step over and grab bars in place Seat Belt Safety/Bike Helmet: Wears seat belt.  Male:   CCS-  utd   PSA- offered pt declined Lab Results  Component Value Date   PSA 0.9 01/21/2010        Objective:    Vitals: There were no vitals taken for this visit.  There is no height or weight on file to calculate BMI.  Advanced Directives 09/15/2018 03/23/2018 07/10/2017 04/02/2016 09/14/2015 03/15/2015  Does Patient Have a Medical Advance Directive? Yes Yes No Yes Yes Yes  Type of Paramedic of [place];Living will [place];Living will - - Living will;Healthcare Power of [place];Living will  Does patient want to make changes to medical advance directive? No - Patient declined - - - No - Patient declined -  Copy of [place] in Chart? No - copy requested - - Yes No - copy requested -  Would patient like information on creating a medical advance directive? - - No - Patient declined - - -    Tobacco Social History   Tobacco Use  Smoking Status Former Smoker  . Last attempt to quit: 09/01/1994  . Years since quitting: 24.0  Smokeless Tobacco Never Used     Counseling given: Not Answered   Clinical Intake:  Pre-visit preparation completed: Yes  Pain : No/denies  pain     Diabetes: Yes CBG done?: No Did pt. bring in CBG monitor from home?: No  How often do you need to have someone help you when you read instructions, pamphlets, or other written materials from your doctor or pharmacy?: 1 - Never What is the last grade level you completed in school?: 16  Interpreter Needed?: No  Information entered by :: Orlie Dakin, LPN  Past Medical History:  Diagnosis Date  . Atypical atrial flutter ([place])   . Coronary artery disease   . Diastolic dysfunction, left ventricle 05/2018   Dr. Rayann Heman- Cardiologist  . HLD (hyperlipidemia)   . HTN (hypertension)   . OSA (obstructive sleep apnea)    uses CPAP  . Peripheral artery disease ([place])    pseudoaneurysm post afib ablation at [facility] 2011, s/p bilateral iliac stents  . Persistent atrial fibrillation   . Pre-diabetes   . Renal artery stenosis ([facility])    right renal artery PTA and stenting  . S/P coronary artery bypass graft x 7 11/16/96   Past Surgical History:  Procedure Laterality Date  . 2-D echocardiogram  03/25/2010   Normal left ventricular function. Mild MR, TR, trivial AR  . ATRIAL FIBRILLATION ABLATION  03/27/2010, 12/31/2010   [facility], Dr. Nadeen Landau  . BACK SURGERY  2007  . cardiac stress test  11/21/2009   Exercise capacity 5 METS. No significant ischemia demonstrated  . CARDIOVERSION N/A 03/15/2015   Procedure: CARDIOVERSION;  Surgeon: Lorretta Harp, MD;  Location: [place];  Service: Cardiovascular;  Laterality: N/A;  . CARDIOVERSION N/A 07/10/2017   Procedure: CARDIOVERSION;  Surgeon: Pixie Casino, MD;  Location: [facility] ENDOSCOPY;  Service: Cardiovascular;  Laterality: N/A;  . CARDIOVERSION N/A 03/23/2018   Procedure: CARDIOVERSION;  Surgeon: Sanda Klein, MD;  Location: [facility] ENDOSCOPY;  Service: Cardiovascular;  Laterality: N/A;  . CORONARY ARTERY BYPASS GRAFT  1998  . ORCHIECTOMY  1981   for cancer  . TOOTH EXTRACTION  05/27/13   tooth extraction with bone graft   Family History   Problem Relation Age of Onset  . Cancer Mother   . Cancer Father   . Other Brother        NO MEDICAL PROBLEMS  . Other Son        NO MEDICAL PROBLEMS  . Leukemia Daughter 6       Recover and has no other problems   Social History   Socioeconomic History  . Marital status: Divorced    Spouse name: Not on file  . Number of children: 2  . Years of education: 68  . Highest education level: Associate degree: academic program  Occupational History  . Occupation: retired    Comment: Community education officer of [employer]  Social Needs  . Financial resource strain: Not hard at all  . Food insecurity:    Worry: Never true    Inability: Never true  . Transportation needs:    Medical: No    Non-medical: No  Tobacco Use  . Smoking status: Former Smoker    Last attempt to quit: 09/01/1994    Years since quitting: 24.0  . Smokeless tobacco: Never Used  Substance and Sexual Activity  . Alcohol use: Yes    Alcohol/week: 1.0 - 2.0 standard drinks    Types: 1 - 2 Glasses of wine per week    Comment: occasional  . Drug use: No  . Sexual activity: Yes  Lifestyle  . Physical activity:    Days per week: 2 days    Minutes per session: 10 min  . Stress: Not at all  Relationships  . Social connections:    Talks on phone: More than three times a week    Gets together: Once a week    Attends religious service: Never    Active member of club or organization: No    Attends meetings of clubs or organizations: Never    Relationship status: Divorced  Other Topics Concern  . Not on file  Social History Narrative   Pt lives in [place] alone.  Divorced. 2 cups of coffee in the morning.   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 acetaminophen (TYLENOL) 500 MG tablet Take 1,000 mg every 6 (six) hours as needed by mouth (for pain.).  [name] atorvastatin (LIPITOR) 10 MG tablet TAKE 1 TABLET(10 MG) BY MOUTH DAILY  . cetirizine (ZYRTEC)  10 MG tablet Take 10 mg by mouth daily.  [name] gabapentin (NEURONTIN) 300 MG capsule TAKE 2 CAPSULES(600 MG) BY MOUTH AT BEDTIME  . lisinopril-hydrochlorothiazide (PRINZIDE,ZESTORETIC) 10-12.5 MG tablet TAKE 1 TABLET BY MOUTH DAILY  . metoprolol succinate (TOPROL-XL) 100 MG 24 hr tablet TAKE 1 TABLET(100 MG) BY MOUTH DAILY  . MULTAQ 400 MG tablet TAKE 1 TABLET BY MOUTH TWICE DAILY WITH MEAL  . Multiple Vitamin (MULTIVITAMIN WITH MINERALS) TABS tablet Take 1 tablet by mouth every other day.   . niacin (NIASPAN) 1000 MG CR tablet TAKE 1 TABLET(1000 MG) BY  MOUTH AT BEDTIME  . NON FORMULARY CPAP at night  . rivaroxaban (XARELTO) 20 MG TABS tablet TAKE 1 TABLET BY MOUTH EVERY DAY WITH SUPPER  . traMADol (ULTRAM) 50 MG tablet TAKE 1 TABLET BY MOUTH EVERY 12 HOURS AS NEEDED  . furosemide (LASIX) 40 MG tablet Take 1 tablet (40 mg total) by mouth as needed.   No facility-administered encounter medications on file as of 09/15/2018.     Activities of Daily Living In your present state of health, do you have any difficulty performing the following activities: 09/15/2018  Hearing? N  Vision? N  Difficulty concentrating or making decisions? N  Walking or climbing stairs? N  Dressing or bathing? Y  Doing errands, shopping? N  Preparing Food and eating ? N  Using the Toilet? N  In the past six months, have you accidently leaked urine? N  Do you have problems with loss of bowel control? N  Managing your Medications? N  Managing your Finances? N  Housekeeping or managing your Housekeeping? N  Some recent data might be hidden    Patient Care Team: Gregor Hams, MD as PCP - General (Family Medicine) Levin Erp, OD as Referring Physician (Optometry)   Assessment:   This is a routine wellness examination for Giovani.Physical assessment deferred to PCP.   Exercise Activities and Dietary recommendations Current Exercise Habits: The patient does not participate in regular exercise at present Diet Eats  a healthy doet. Breakfast: toast and fruit or energy bar Lunch: samdwich Dinner:  doent eat heavy sinners     Goals   None     Fall Risk Fall Risk  09/15/2018 06/09/2018 06/09/2018  Falls in the past year? 0 No No  Follow up Falls prevention discussed - -   Is the patient's home free of loose throw rugs in walkways, pet beds, electrical cords, etc?   yes      Grab bars in the bathroom? yes      Handrails on the stairs?   yes      Adequate lighting?   yes   Depression Screen PHQ 2/9 Scores 09/15/2018 06/09/2018 06/03/2017  PHQ - 2 Score 0 0 0    Cognitive Function     6CIT Screen 09/15/2018 06/03/2017  What Year? 0 points 0 points  What month? 0 points 0 points  What time? 0 points 0 points  Count back from 20 0 points 0 points  Months in reverse 0 points 0 points  Repeat phrase 0 points 0 points  Total Score 0 0    Immunization History  Administered Date(s) Administered  . Influenza,inj,Quad PF,6+ Mos 06/09/2018  . Influenza-Unspecified 07/03/2011, 07/03/2014, 06/02/2015, 05/30/2016  . Pneumococcal Conjugate-13 07/17/2011, 10/17/2015  . Pneumococcal Polysaccharide-23 12/09/2000  . Pneumococcal-Unspecified 07/17/2011  . Tdap 07/03/2011  . Varicella 09/16/2015  . Zoster 09/21/2015    Screening Tests Health Maintenance  Topic Date Due  . PNA vac Low Risk Adult (2 of 2 - PPSV23) 10/16/2016  . OPHTHALMOLOGY EXAM  09/07/2018  . HEMOGLOBIN A1C  11/25/2018  . FOOT EXAM  06/10/2019  . COLONOSCOPY  06/27/2020  . TETANUS/TDAP  07/02/2021  . INFLUENZA VACCINE  Completed  . Hepatitis C Screening  Completed        Plan:    Mr. Wotton , Thank you for taking time to come for your Medicare Wellness Visit. I appreciate your ongoing commitment to your health goals. Please review the following plan we discussed and let me know if I  can assist you in the future.  Please schedule your next medicare c Continue doing brain stimulating activities (puzzles, reading, adult  coloring books, staying active) to keep memory sharp.    These are the goals we discussed: Goals   None     This is a list of the screening recommended for you and due dates:  Health Maintenance  Topic Date Due  . Pneumonia vaccines (2 of 2 - PPSV23) 10/16/2016  . Eye exam for diabetics  09/07/2018  . Hemoglobin A1C  11/25/2018  . Complete foot exam   06/10/2019  . Colon Cancer Screening  06/27/2020  . Tetanus Vaccine  07/02/2021  . Flu Shot  Completed  .  Hepatitis C: One time screening is recommended by Center for Disease Control  (CDC) for  adults born from [birth year] through 1963.   Completed     I have personally reviewed and noted the following in the patient's chart:   . Medical and social history . Use of alcohol, tobacco or illicit drugs  . Current medications and supplements . Functional ability and status . Nutritional status . Physical activity . Advanced directives . List of other physicians . Hospitalizations, surgeries, and ER visits in previous 12 months . Vitals . Screenings to include cognitive, depression, and falls . Referrals and appointments  In addition, I have reviewed and discussed with patient certain preventive protocols, quality metrics, and best practice recommendations. A written personalized care plan for preventive services as well as general preventive health recommendations were provided to patient.     Joanne Chars, LPN  04/20/8137

## 2018-09-09 ENCOUNTER — Ambulatory Visit: Payer: Medicare Other | Admitting: Family Medicine

## 2018-09-15 ENCOUNTER — Ambulatory Visit (INDEPENDENT_AMBULATORY_CARE_PROVIDER_SITE_OTHER): Payer: Medicare Other | Admitting: Family Medicine

## 2018-09-15 ENCOUNTER — Ambulatory Visit (INDEPENDENT_AMBULATORY_CARE_PROVIDER_SITE_OTHER): Payer: Medicare Other

## 2018-09-15 ENCOUNTER — Ambulatory Visit (INDEPENDENT_AMBULATORY_CARE_PROVIDER_SITE_OTHER): Payer: Medicare Other | Admitting: *Deleted

## 2018-09-15 ENCOUNTER — Encounter: Payer: Self-pay | Admitting: Family Medicine

## 2018-09-15 VITALS — BP 110/52 | HR 62 | Ht 73.0 in | Wt 226.0 lb

## 2018-09-15 DIAGNOSIS — Z23 Encounter for immunization: Secondary | ICD-10-CM

## 2018-09-15 DIAGNOSIS — N183 Chronic kidney disease, stage 3 unspecified: Secondary | ICD-10-CM

## 2018-09-15 DIAGNOSIS — I152 Hypertension secondary to endocrine disorders: Secondary | ICD-10-CM

## 2018-09-15 DIAGNOSIS — I2583 Coronary atherosclerosis due to lipid rich plaque: Secondary | ICD-10-CM

## 2018-09-15 DIAGNOSIS — R05 Cough: Secondary | ICD-10-CM | POA: Diagnosis not present

## 2018-09-15 DIAGNOSIS — E1159 Type 2 diabetes mellitus with other circulatory complications: Secondary | ICD-10-CM

## 2018-09-15 DIAGNOSIS — I701 Atherosclerosis of renal artery: Secondary | ICD-10-CM | POA: Diagnosis not present

## 2018-09-15 DIAGNOSIS — E785 Hyperlipidemia, unspecified: Secondary | ICD-10-CM

## 2018-09-15 DIAGNOSIS — I10 Essential (primary) hypertension: Secondary | ICD-10-CM

## 2018-09-15 DIAGNOSIS — I110 Hypertensive heart disease with heart failure: Secondary | ICD-10-CM

## 2018-09-15 DIAGNOSIS — Z0000 Encounter for general adult medical examination without abnormal findings: Secondary | ICD-10-CM | POA: Diagnosis not present

## 2018-09-15 DIAGNOSIS — E1122 Type 2 diabetes mellitus with diabetic chronic kidney disease: Secondary | ICD-10-CM

## 2018-09-15 DIAGNOSIS — Z7901 Long term (current) use of anticoagulants: Secondary | ICD-10-CM

## 2018-09-15 DIAGNOSIS — I2510 Atherosclerotic heart disease of native coronary artery without angina pectoris: Secondary | ICD-10-CM

## 2018-09-15 DIAGNOSIS — I517 Cardiomegaly: Secondary | ICD-10-CM | POA: Diagnosis not present

## 2018-09-15 DIAGNOSIS — I739 Peripheral vascular disease, unspecified: Secondary | ICD-10-CM

## 2018-09-15 DIAGNOSIS — R059 Cough, unspecified: Secondary | ICD-10-CM

## 2018-09-15 DIAGNOSIS — I480 Paroxysmal atrial fibrillation: Secondary | ICD-10-CM

## 2018-09-15 DIAGNOSIS — G4700 Insomnia, unspecified: Secondary | ICD-10-CM

## 2018-09-15 DIAGNOSIS — N1831 Chronic kidney disease, stage 3a: Secondary | ICD-10-CM

## 2018-09-15 DIAGNOSIS — E1169 Type 2 diabetes mellitus with other specified complication: Secondary | ICD-10-CM

## 2018-09-15 LAB — POCT GLYCOSYLATED HEMOGLOBIN (HGB A1C): Hemoglobin A1C: 6.6 % — AB (ref 4.0–5.6)

## 2018-09-15 MED ORDER — ALBUTEROL SULFATE HFA 108 (90 BASE) MCG/ACT IN AERS: 2.0000 | INHALATION_SPRAY | Freq: Four times a day (QID) | RESPIRATORY_TRACT | 0 refills | Status: DC | PRN

## 2018-09-15 MED ORDER — EMPAGLIFLOZIN 10 MG PO TABS: 10.0000 mg | ORAL_TABLET | Freq: Every day | ORAL | 1 refills | Status: DC

## 2018-09-15 MED ORDER — TRAZODONE HCL 50 MG PO TABS: 25.0000 mg | ORAL_TABLET | Freq: Every evening | ORAL | 3 refills | Status: DC | PRN

## 2018-09-15 NOTE — Patient Instructions (Signed)
[name] , Thank you for taking time to come for your Medicare Wellness Visit. I appreciate your ongoing commitment to your health goals. Please review the following plan we discussed and let me know if I can assist you in the future.  Please schedule your next medicare [name] , Thank you for taking time to come for your Medicare Wellness Visit. I appreciate your ongoing commitment to your health goals. Please review the following plan we discussed and let me know if I can assist you in the future.  Please schedule your next medicare  Continue doing brain stimulating activities (puzzles, reading, adult coloring books, staying active) to keep memory sharp.    Health Maintenance After Age 71 After age 6, you are at a higher risk for certain long-term diseases and infections as well as injuries from falls. Falls are a major cause of broken bones and head injuries in people who are older than age 71. Getting regular preventive care can help to keep you healthy and well. Preventive care includes getting regular testing and making lifestyle changes as recommended by your health care provider. Talk with your health care provider about:  Which screenings and tests you should have. A screening is a test that checks for a disease when you have no symptoms.  A diet and exercise plan that is right for you. What should I know about screenings and tests to prevent falls? Screening and testing are the best ways to find a health problem early. Early diagnosis and treatment give you the best chance of managing medical conditions that are common after age 71. Certain conditions and lifestyle choices may make you more likely to have a fall. Your health care provider may recommend:  Regular vision checks. Poor vision and conditions such as cataracts can make you more likely to have a fall. If you wear glasses, make sure to get your prescription updated if your vision changes.  Medicine review. Work with your  health care provider to regularly review all of the medicines you are taking, including over-the-counter medicines. Ask your health care provider about any side effects that may make you more likely to have a fall. Tell your health care provider if any medicines that you take make you feel dizzy or sleepy.  Osteoporosis screening. Osteoporosis is a condition that causes the bones to get weaker. This can make the bones weak and cause them to break more easily.  Blood pressure screening. Blood pressure changes and medicines to control blood pressure can make you feel dizzy.  Strength and balance checks. Your health care provider may recommend certain tests to check your strength and balance while standing, walking, or changing positions.  Foot health exam. Foot pain and numbness, as well as not wearing proper footwear, can make you more likely to have a fall.  Depression screening. You may be more likely to have a fall if you have a fear of falling, feel emotionally low, or feel unable to do activities that you used to do.  Alcohol use screening. Using too much alcohol can affect your balance and may make you more likely to have a fall. What actions can I take to lower my risk of falls? General instructions  Talk with your health care provider about your risks for falling. Tell your health care provider if: ? You fall. Be sure to tell your health care provider about all falls, even ones that seem minor. ? You feel dizzy, sleepy, or off-balance.  Take over-the-counter and  prescription medicines only as told by your health care provider. These include any supplements.  Eat a healthy diet and maintain a healthy weight. A healthy diet includes low-fat dairy products, low-fat (lean) meats, and fiber from whole grains, beans, and lots of fruits and vegetables. Home safety  Remove any tripping hazards, such as rugs, cords, and clutter.  Install safety equipment such as grab bars in bathrooms and  safety rails on stairs.  Keep rooms and walkways well-lit. Activity   Follow a regular exercise program to stay fit. This will help you maintain your balance. Ask your health care provider what types of exercise are appropriate for you.  If you need a cane or walker, use it as recommended by your health care provider.  Wear supportive shoes that have nonskid soles. Lifestyle  Do not drink alcohol if your health care provider tells you not to drink.  If you drink alcohol, limit how much you have: ? 0-1 drink a day for women. ? 0-2 drinks a day for men.  Be aware of how much alcohol is in your drink. In the [place] one drink equals one typical bottle of beer (12 oz), one-half glass of wine (5 oz), or one shot of hard liquor (1 oz).  Do not use any products that contain nicotine or tobacco, such as cigarettes and e-cigarettes. If you need help quitting, ask your health care provider. Summary  Having a healthy lifestyle and getting preventive care can help to protect your health and wellness after age 71.  Screening and testing are the best way to find a health problem early and help you avoid having a fall. Early diagnosis and treatment give you the best chance for managing medical conditions that are more common for people who are older than age 71.  Falls are a major cause of broken bones and head injuries in people who are older than age 71. Take precautions to prevent a fall at home.  Work with your health care provider to learn what changes you can make to improve your health and wellness and to prevent falls. This information is not intended to replace advice given to you by your health care provider. Make sure you discuss any questions you have with your health care provider. Document Released: 07/01/2017 Document Revised: 07/01/2017 Document Reviewed: 07/01/2017 Elsevier Interactive Patient Education  2019 [employer].

## 2018-09-15 NOTE — Patient Instructions (Addendum)
Thank you for coming in today. Start [place]. Let me know if you have trouble getting it.  Alternative is [place]   Use trazodone at bedtime as needed for insomnia.   Get xray lungs on the way out.   Try using albuterol inhaler as needed for wheeze or cough.   Schedule nurse visit PFT (lung function test).   Recheck with me in 3 months for Diabetes.   Empagliflozin oral tablets What is this medicine? EMPAGLIFLOZIN (EM pa gli FLOE zin) helps to treat type 2 diabetes. It helps to control blood sugar. This drug may also reduce the risk of heart attack or stroke if you have type 2 diabetes and risk factors for heart disease. Treatment is combined with diet and exercise. This medicine may be used for other purposes; ask your health care provider or pharmacist if you have questions. COMMON BRAND NAME(S): JARDIANCE What should I tell my health care provider before I take this medicine? They need to know if you have any of these conditions: -dehydration -diabetic ketoacidosis -diet low in salt -eating less due to illness, surgery, dieting, or any other reason -having surgery -high cholesterol -high levels of potassium in the blood -history of pancreatitis or pancreas problems -history of yeast infection of the penis or vagina -if you often drink alcohol -infections in the bladder, kidneys, or urinary tract -kidney disease -liver disease -low blood pressure -on hemodialysis -problems urinating -type 1 diabetes -uncircumcised male -an unusual or allergic reaction to empagliflozin, other medicines, foods, dyes, or preservatives -pregnant or trying to get pregnant -breast-feeding How should I use this medicine? Take this medicine by mouth with a glass of water. Follow the directions on the prescription label. Take it in the morning, with or without food. Take your dose at the same time each day. Do not take more often than directed. Do not stop taking except on your doctor's  advice. Talk to your pediatrician regarding the use of this medicine in children. Special care may be needed. Overdosage: If you think you have taken too much of this medicine contact a poison control center or emergency room at once. NOTE: This medicine is only for you. Do not share this medicine with others. What if I miss a dose? If you miss a dose, take it as soon as you can. If it is almost time for your next dose, take only that dose. Do not take double or extra doses. What may interact with this medicine? Do not take this medicine with any of the following medications: -gatifloxacin This medicine may also interact with the following medications: -alcohol -certain medicines for blood pressure, heart disease -diuretics This list may not describe all possible interactions. Give your health care provider a list of all the medicines, herbs, non-prescription drugs, or dietary supplements you use. Also tell them if you smoke, drink alcohol, or use illegal drugs. Some items may interact with your medicine. What should I watch for while using this medicine? Visit your doctor or health care professional for regular checks on your progress. This medicine can cause a serious condition in which there is too much acid in the blood. If you develop nausea, vomiting, stomach pain, unusual tiredness, or breathing problems, stop taking this medicine and call your doctor right away. If possible, use a ketone dipstick to check for ketones in your urine. A test called the HbA1C (A1C) will be monitored. This is a simple blood test. It measures your blood sugar control over the last 2 to  3 months. You will receive this test every 3 to 6 months. Learn how to check your blood sugar. Learn the symptoms of low and high blood sugar and how to manage them. Always carry a quick-source of sugar with you in case you have symptoms of low blood sugar. Examples include hard sugar candy or glucose tablets. Make sure others  know that you can choke if you eat or drink when you develop serious symptoms of low blood sugar, such as seizures or unconsciousness. They must get medical help at once. Tell your doctor or health care professional if you have high blood sugar. You might need to change the dose of your medicine. If you are sick or exercising more than usual, you might need to change the dose of your medicine. Do not skip meals. Ask your doctor or health care professional if you should avoid alcohol. Many nonprescription cough and cold products contain sugar or alcohol. These can affect blood sugar. Wear a medical ID bracelet or chain, and carry a card that describes your disease and details of your medicine and dosage times. What side effects may I notice from receiving this medicine? Side effects that you should report to your doctor or health care professional as soon as possible: -allergic reactions like skin rash, itching or hives, swelling of the face, lips, or tongue -breathing problems -dizziness -feeling faint or lightheaded, falls -muscle weakness -nausea, vomiting, unusual stomach upset or pain -penile discharge, itching, or pain in men -signs and symptoms of a genital infection, such as fever; tenderness, redness, or swelling in the genitals or area from the genitals to the back of the rectum -signs and symptoms of low blood sugar such as feeling anxious, confusion, dizziness, increased hunger, unusually weak or tired, sweating, shakiness, cold, irritable, headache, blurred vision, fast heartbeat, loss of consciousness -signs and symptoms of a urinary tract infection, such as fever, chills, a burning feeling when urinating, blood in the urine, back pain -trouble passing urine or change in the amount of urine, including an urgent need to urinate more often, in larger amounts, or at night -unusual tiredness -vaginal discharge, itching, or odor in women Side effects that usually do not require medical  attention (report to your doctor or health care professional if they continue or are bothersome): -mild increase in urination -thirsty This list may not describe all possible side effects. Call your doctor for medical advice about side effects. You may report side effects to FDA at 1-800-FDA-1088. Where should I keep my medicine? Keep out of the reach of children. Store at room temperature between 20 and 25 degrees C (68 and 77 degrees F). Throw away any unused medicine after the expiration date. NOTE: This sheet is a summary. It may not cover all possible information. If you have questions about this medicine, talk to your doctor, pharmacist, or health care provider.  2019 Elsevier/Gold Standard (2017-04-30 10:25:34)   Trazodone tablets What is this medicine? TRAZODONE (TRAZ oh done) is used to treat depression. This medicine may be used for other purposes; ask your health care provider or pharmacist if you have questions. COMMON BRAND NAME(S): Desyrel What should I tell my health care provider before I take this medicine? They need to know if you have any of these conditions: -attempted suicide or thinking about it -bipolar disorder -bleeding problems -glaucoma -heart disease, or previous heart attack -irregular heart beat -kidney or liver disease -low levels of sodium in the blood -an unusual or allergic reaction to trazodone,  other medicines, foods, dyes or preservatives -pregnant or trying to get pregnant -breast-feeding How should I use this medicine? Take this medicine by mouth with a glass of water. Follow the directions on the prescription label. Take this medicine shortly after a meal or a light snack. Take your medicine at regular intervals. Do not take your medicine more often than directed. Do not stop taking this medicine suddenly except upon the advice of your doctor. Stopping this medicine too quickly may cause serious side effects or your condition may worsen. A  special MedGuide will be given to you by the pharmacist with each prescription and refill. Be sure to read this information carefully each time. Talk to your pediatrician regarding the use of this medicine in children. Special care may be needed. Overdosage: If you think you have taken too much of this medicine contact a poison control center or emergency room at once. NOTE: This medicine is only for you. Do not share this medicine with others. What if I miss a dose? If you miss a dose, take it as soon as you can. If it is almost time for your next dose, take only that dose. Do not take double or extra doses. What may interact with this medicine? Do not take this medicine with any of the following medications: -certain medicines for fungal infections like fluconazole, itraconazole, ketoconazole, posaconazole, voriconazole -cisapride -dofetilide -dronedarone -linezolid -MAOIs like Carbex, Eldepryl, Marplan, Nardil, and Parnate -mesoridazine -methylene blue (injected into a vein) -pimozide -saquinavir -thioridazine This medicine may also interact with the following medications: -alcohol -antiviral medicines for HIV or AIDS -aspirin and aspirin-like medicines -barbiturates like phenobarbital -certain medicines for blood pressure, heart disease, irregular heart beat -certain medicines for depression, anxiety, or psychotic disturbances -certain medicines for migraine headache like almotriptan, eletriptan, frovatriptan, naratriptan, rizatriptan, sumatriptan, zolmitriptan -certain medicines for seizures like carbamazepine and phenytoin -certain medicines for sleep -certain medicines that treat or prevent blood clots like dalteparin, enoxaparin, warfarin -digoxin -fentanyl -lithium -NSAIDS, medicines for pain and inflammation, like ibuprofen or naproxen -other medicines that prolong the QT interval (cause an abnormal heart rhythm) -rasagiline -supplements like St. John's wort, kava kava,  valerian -tramadol -tryptophan This list may not describe all possible interactions. Give your health care provider a list of all the medicines, herbs, non-prescription drugs, or dietary supplements you use. Also tell them if you smoke, drink alcohol, or use illegal drugs. Some items may interact with your medicine. What should I watch for while using this medicine? Tell your doctor if your symptoms do not get better or if they get worse. Visit your doctor or health care professional for regular checks on your progress. Because it may take several weeks to see the full effects of this medicine, it is important to continue your treatment as prescribed by your doctor. Patients and their families should watch out for new or worsening thoughts of suicide or depression. Also watch out for sudden changes in feelings such as feeling anxious, agitated, panicky, irritable, hostile, aggressive, impulsive, severely restless, overly excited and hyperactive, or not being able to sleep. If this happens, especially at the beginning of treatment or after a change in dose, call your health care professional. Dennis Bast may get drowsy or dizzy. Do not drive, use machinery, or do anything that needs mental alertness until you know how this medicine affects you. Do not stand or sit up quickly, especially if you are an older patient. This reduces the risk of dizzy or fainting spells. Alcohol may interfere  with the effect of this medicine. Avoid alcoholic drinks. This medicine may cause dry eyes and blurred vision. If you wear contact lenses you may feel some discomfort. Lubricating drops may help. See your eye doctor if the problem does not go away or is severe. Your mouth may get dry. Chewing sugarless gum, sucking hard candy and drinking plenty of water may help. Contact your doctor if the problem does not go away or is severe. What side effects may I notice from receiving this medicine? Side effects that you should report to your  doctor or health care professional as soon as possible: -allergic reactions like skin rash, itching or hives, swelling of the face, lips, or tongue -elevated mood, decreased need for sleep, racing thoughts, impulsive behavior -confusion -fast, irregular heartbeat -feeling faint or lightheaded, falls -feeling agitated, angry, or irritable -loss of balance or coordination -painful or prolonged erections -restlessness, pacing, inability to keep still -suicidal thoughts or other mood changes -tremors -trouble sleeping -seizures -unusual bleeding or bruising Side effects that usually do not require medical attention (report to your doctor or health care professional if they continue or are bothersome): -change in sex drive or performance -change in appetite or weight -constipation -headache -muscle aches or pains -nausea This list may not describe all possible side effects. Call your doctor for medical advice about side effects. You may report side effects to FDA at 1-800-FDA-1088. Where should I keep my medicine? Keep out of the reach of children. Store at room temperature between 15 and 30 degrees C (59 to 86 degrees F). Protect from light. Keep container tightly closed. Throw away any unused medicine after the expiration date. NOTE: This sheet is a summary. It may not cover all possible information. If you have questions about this medicine, talk to your doctor, pharmacist, or health care provider.  2019 Elsevier/Gold Standard (2017-10-27 17:51:24)

## 2018-09-15 NOTE — Progress Notes (Signed)
[name] is a 73 y.o. male who presents to [facility] today for diabetes cough/congestion, and insomnia.  [name] has a history of well-controlled diabetes.  He does not take any medications regularly.  He uses low carbohydrate diet in an effort to control his blood sugars.  He feels well with no polyuria or polydipsia.  [name] does have a history of multiple cardiac etiologies including hypertension, pulmonary hypertension, atrial fibrillation, diastolic dysfunction and heart failure.  He is currently managed with lisinopril/hydrochlorothiazide, metoprolol, Multaq, Lipitor and niacin, and infrequent uses of Lasix.  He notes he denies significant chest pain or palpitations.   He does however note cough and congestion and occasional mild shortness of breath.  He has a smoking history from 80s to 90s.  Is been over 10 years since he quit smoking.  He has had some evidence of pulmonary dysfunction following amiodarone in the past that improved per report.  He does not currently take any medications for his lung issues but he is worried he may have heart failure or some other issue contributing to his breathing.  Additionally [name] notes insomnia.  He has difficulty falling asleep and staying asleep.  Some nights he will sleep at all.  He has had trials of Ambien in the past which only worked a little.  He is tried Xanax in the past which helped quite a bit but he is afraid to take it very frequently.  He is never had trials of trazodone.  He does work on sleep hygiene avoiding caffeine stimulating activities and bluelight in the evenings.   ROS as above:  Exam:  BP (!) 110/52   Pulse 62   Ht 6\' 1"  (1.854 m)   Wt 226 lb (102.5 kg)   BMI 29.82 kg/m  Wt Readings from Last 5 Encounters:  09/15/18 226 lb (102.5 kg)  09/15/18 226 lb (102.5 kg)  07/20/18 225 lb 9.6 oz (102.3  kg)  06/09/18 225 lb (102.1 kg)  05/05/18 225 lb 6.4 oz (102.2 kg)    Gen: Well NAD HEENT: EOMI,  MMM Lungs: Normal work of breathing.  Coarse breath sounds with slight wheezing and prolonged expiratory phase present bilaterally. Heart: RRR no MRG Abd: NABS, Soft. Nondistended, Nontender Exts: Brisk capillary refill, warm and well perfused.   Lab and Radiology Results Results for orders placed or performed in visit on 09/15/18 (from the past 72 hour(s))  POCT HgB A1C     Status: Abnormal   Collection Time: 09/15/18 11:06 AM  Result Value Ref Range   Hemoglobin A1C 6.6 (A) 4.0 - 5.6 %   HbA1c POC (<> result, manual entry)     HbA1c, POC (prediabetic range)     HbA1c, POC (controlled diabetic range)     No results found. Chest x-ray images personally independently reviewed Cardiomegaly stable compared to prior view 2017.  Bronchitic changes present.  Hazy diffuse opacity overlying the left lung field slight change from 2017. Concern vascular congestion.  Doubtful pneumonia.  Await formal radiology review.   Assessment and Plan: 73 y.o. male with  Diabetes: Well-controlled with A1c 6.6.  As noted below will start Jardiance as this will also help diabetes but may help his cardiac risk.  Plan to recheck in about a month.  Will use lower dose due to CKD 3 with GFR 50s.  Check metabolic panel next visit.  Cough and congestion: Multifactorial likely. I think the most likely  explanation here is probably a COPD.  However pulmonary edema due to heart failure can certainly be a possibility as well.  Plan for trial of albuterol and start Jardiance as above which should help with some potential fluid overload.  Additionally will use a trial of an albuterol inhaler. However will proceed with in office pulmonary function testing to evaluate for possible COPD.  Hypertension, CVD risk: Multiple related diagnoses.  Plan to proceed with Jardiance as above.  Continue comanagement with cardiology  recheck 3 months.  Insomnia: Longstanding difficulty treat.  Would very much like to avoid sedative hypnotics or benzodiazepines.  Continue to optimize sleep hygiene and trial of trazodone.  Recheck 3 months or sooner if needed.  Health maintenance: Pneumovax 23 vaccine given today prior to discharge.    PDMP reviewed during this encounter. Orders Placed This Encounter  Procedures  . DG Chest 2 View    Order Specific Question:   Reason for exam:    Answer:   Cough, assess intra-thoracic pathology    Order Specific Question:   Preferred imaging location?    Answer:   [facility]  . Pneumococcal polysaccharide vaccine 23-valent greater than or equal to 2yo subcutaneous/IM  . POCT HgB A1C   Meds ordered this encounter  Medications  . empagliflozin (JARDIANCE) 10 MG TABS tablet    Sig: Take 10 mg by mouth daily.    Dispense:  90 tablet    Refill:  1  . traZODone (DESYREL) 50 MG tablet    Sig: Take 0.5-1 tablets (25-50 mg total) by mouth at bedtime as needed for sleep.    Dispense:  30 tablet    Refill:  3  . albuterol (PROVENTIL HFA;VENTOLIN HFA) 108 (90 Base) MCG/ACT inhaler    Sig: Inhale 2 puffs into the lungs every 6 (six) hours as needed for wheezing or shortness of breath (cough).    Dispense:  1 Inhaler    Refill:  0     Historical information moved to improve visibility of documentation.  Past Medical History:  Diagnosis Date  . Atypical atrial flutter ([place])   . Coronary artery disease   . Diastolic dysfunction, left ventricle 05/2018   Dr. Rayann Heman- Cardiologist  . HLD (hyperlipidemia)   . HTN (hypertension)   . OSA (obstructive sleep apnea)    uses CPAP  . Peripheral artery disease ([place])    pseudoaneurysm post afib ablation at [facility] 2011, s/p bilateral iliac stents  . Persistent atrial fibrillation   . Pre-diabetes   . Renal artery stenosis ([facility])    right renal artery PTA and stenting  . S/P coronary artery bypass graft x 7 11/16/96   Past Surgical  History:  Procedure Laterality Date  . 2-D echocardiogram  03/25/2010   Normal left ventricular function. Mild MR, TR, trivial AR  . ATRIAL FIBRILLATION ABLATION  03/27/2010, 12/31/2010   [facility], Dr. Nadeen Landau  . BACK SURGERY  2007  . cardiac stress test  11/21/2009   Exercise capacity 5 METS. No significant ischemia demonstrated  . CARDIOVERSION N/A 03/15/2015   Procedure: CARDIOVERSION;  Surgeon: Lorretta Harp, MD;  Location: [facility] ENDOSCOPY;  Service: Cardiovascular;  Laterality: N/A;  . CARDIOVERSION N/A 07/10/2017   Procedure: CARDIOVERSION;  Surgeon: Pixie Casino, MD;  Location: [facility] ENDOSCOPY;  Service: Cardiovascular;  Laterality: N/A;  . CARDIOVERSION N/A 03/23/2018   Procedure: CARDIOVERSION;  Surgeon: Sanda Klein, MD;  Location: [facility] ENDOSCOPY;  Service: Cardiovascular;  Laterality: N/A;  . CORONARY ARTERY BYPASS GRAFT  1998  .  ORCHIECTOMY  1981   for cancer  . TOOTH EXTRACTION  05/27/13   tooth extraction with bone graft   Social History   Tobacco Use  . Smoking status: Former Smoker    Last attempt to quit: 09/01/1994    Years since quitting: 24.0  . Smokeless tobacco: Never Used  Substance Use Topics  . Alcohol use: Yes    Alcohol/week: 1.0 - 2.0 standard drinks    Types: 1 - 2 Glasses of wine per week    Comment: occasional   family history includes Cancer in his father and mother; Leukemia (age of onset: 38) in his daughter; Other in his brother and son.  Medications: Current Outpatient Medications  Medication Sig Dispense Refill  . acetaminophen (TYLENOL) 500 MG tablet Take 1,000 mg every 6 (six) hours as needed by mouth (for pain.).    [name] atorvastatin (LIPITOR) 10 MG tablet TAKE 1 TABLET(10 MG) BY MOUTH DAILY 90 tablet 3  . cetirizine (ZYRTEC) 10 MG tablet Take 10 mg by mouth daily.    [name] gabapentin (NEURONTIN) 300 MG capsule TAKE 2 CAPSULES(600 MG) BY MOUTH AT BEDTIME 180 capsule 0  . lisinopril-hydrochlorothiazide (PRINZIDE,ZESTORETIC) 10-12.5 MG tablet TAKE 1  TABLET BY MOUTH DAILY 90 tablet 2  . metoprolol succinate (TOPROL-XL) 100 MG 24 hr tablet TAKE 1 TABLET(100 MG) BY MOUTH DAILY 90 tablet 3  . MULTAQ 400 MG tablet TAKE 1 TABLET BY MOUTH TWICE DAILY WITH MEAL 180 tablet 1  . Multiple Vitamin (MULTIVITAMIN WITH MINERALS) TABS tablet Take 1 tablet by mouth every other day.     . niacin (NIASPAN) 1000 MG CR tablet TAKE 1 TABLET(1000 MG) BY MOUTH AT BEDTIME 90 tablet 3  . NON FORMULARY CPAP at night    . rivaroxaban (XARELTO) 20 MG TABS tablet TAKE 1 TABLET BY MOUTH EVERY DAY WITH SUPPER 90 tablet 2  . traMADol (ULTRAM) 50 MG tablet TAKE 1 TABLET BY MOUTH EVERY 12 HOURS AS NEEDED 30 tablet 0  . albuterol (PROVENTIL HFA;VENTOLIN HFA) 108 (90 Base) MCG/ACT inhaler Inhale 2 puffs into the lungs every 6 (six) hours as needed for wheezing or shortness of breath (cough). 1 Inhaler 0  . empagliflozin (JARDIANCE) 10 MG TABS tablet Take 10 mg by mouth daily. 90 tablet 1  . furosemide (LASIX) 40 MG tablet Take 1 tablet (40 mg total) by mouth as needed. 30 tablet 0  . traZODone (DESYREL) 50 MG tablet Take 0.5-1 tablets (25-50 mg total) by mouth at bedtime as needed for sleep. 30 tablet 3   No current facility-administered medications for this visit.    No Known Allergies   Discussed warning signs or symptoms. Please see discharge instructions. Patient expresses understanding.

## 2018-09-29 ENCOUNTER — Ambulatory Visit (INDEPENDENT_AMBULATORY_CARE_PROVIDER_SITE_OTHER): Payer: Medicare Other | Admitting: Family Medicine

## 2018-09-29 VITALS — BP 111/57 | HR 61 | Temp 97.2°F | Ht 69.75 in | Wt 227.0 lb

## 2018-09-29 DIAGNOSIS — R942 Abnormal results of pulmonary function studies: Secondary | ICD-10-CM

## 2018-09-29 DIAGNOSIS — R05 Cough: Secondary | ICD-10-CM | POA: Diagnosis not present

## 2018-09-29 DIAGNOSIS — R0602 Shortness of breath: Secondary | ICD-10-CM | POA: Diagnosis not present

## 2018-09-29 DIAGNOSIS — R059 Cough, unspecified: Secondary | ICD-10-CM

## 2018-09-29 DIAGNOSIS — Z87891 Personal history of nicotine dependence: Secondary | ICD-10-CM | POA: Diagnosis not present

## 2018-09-29 MED ORDER — ALBUTEROL SULFATE (2.5 MG/3ML) 0.083% IN NEBU
2.5000 mg | INHALATION_SOLUTION | Freq: Once | RESPIRATORY_TRACT | Status: AC
  Administered 2018-09-29: 2.5 mg via RESPIRATORY_TRACT

## 2018-09-29 NOTE — Progress Notes (Signed)
Pt in today for Spirometry . Vitals obtained. Pt tolerated procedure well, results given to provider to review. Advised patient office would call with results.  Results scanned into chart.

## 2018-10-01 ENCOUNTER — Telehealth: Payer: Self-pay | Admitting: Family Medicine

## 2018-10-01 NOTE — Telephone Encounter (Signed)
Pt advised. He questions is there is a change from the testing he had done in 2011. He will send his question and some info regarding his old testing via [place].

## 2018-10-01 NOTE — Progress Notes (Signed)
Spirometry report reviewed.  Significant decrease in  FVC 62% predicted FEV1 56% predicted FEV1/FVC percentage 90% predicted PEF 87% predicted FEF 25-75 42% predicted  Slight 14% improvement in FVC following albuterol.  Significant abnormal study.  Concern COPD.  Plan to refer to pulmonology for shortness of breath described in my note dated 09/15/2018

## 2018-10-01 NOTE — Addendum Note (Signed)
Addended by: Gregor Hams on: 10/01/2018 08:52 AM   Modules accepted: Orders

## 2018-10-01 NOTE — Telephone Encounter (Signed)
Pulmonary function test is abnormal.  This is somewhat concerning for COPD.  Plan to refer to pulmonology for further evaluation and initial treatment.  Please let me know if you do not hear from anybody or have any issues.

## 2018-10-04 ENCOUNTER — Encounter: Payer: Self-pay | Admitting: Family Medicine

## 2018-10-05 ENCOUNTER — Other Ambulatory Visit: Payer: Self-pay | Admitting: Family Medicine

## 2018-10-05 NOTE — Telephone Encounter (Signed)
Spoke to patient he has already been contacted about an appointment. Rhonda Cunningham,CMA

## 2018-10-25 ENCOUNTER — Other Ambulatory Visit: Payer: Self-pay | Admitting: Family Medicine

## 2018-10-27 ENCOUNTER — Ambulatory Visit: Payer: Medicare Other | Admitting: Pulmonary Disease

## 2018-10-27 ENCOUNTER — Encounter: Payer: Self-pay | Admitting: Pulmonary Disease

## 2018-10-27 VITALS — BP 124/64 | HR 79 | Ht 69.0 in | Wt 225.2 lb

## 2018-10-27 DIAGNOSIS — J449 Chronic obstructive pulmonary disease, unspecified: Secondary | ICD-10-CM | POA: Diagnosis not present

## 2018-10-27 DIAGNOSIS — J849 Interstitial pulmonary disease, unspecified: Secondary | ICD-10-CM | POA: Diagnosis not present

## 2018-10-27 LAB — CBC WITH DIFFERENTIAL/PLATELET
Basophils Absolute: 0 10*3/uL (ref 0.0–0.1)
Basophils Relative: 0.4 % (ref 0.0–3.0)
Eosinophils Absolute: 0.5 10*3/uL (ref 0.0–0.7)
Eosinophils Relative: 6.5 % — ABNORMAL HIGH (ref 0.0–5.0)
HCT: 45.2 % (ref 39.0–52.0)
Hemoglobin: 15.1 g/dL (ref 13.0–17.0)
Lymphocytes Relative: 25.2 % (ref 12.0–46.0)
Lymphs Abs: 2.1 10*3/uL (ref 0.7–4.0)
MCHC: 33.3 g/dL (ref 30.0–36.0)
MCV: 90.8 fl (ref 78.0–100.0)
Monocytes Absolute: 1.1 10*3/uL — ABNORMAL HIGH (ref 0.1–1.0)
Monocytes Relative: 12.9 % — ABNORMAL HIGH (ref 3.0–12.0)
Neutro Abs: 4.5 10*3/uL (ref 1.4–7.7)
Neutrophils Relative %: 55 % (ref 43.0–77.0)
Platelets: 233 10*3/uL (ref 150.0–400.0)
RBC: 4.98 Mil/uL (ref 4.22–5.81)
RDW: 16.2 % — ABNORMAL HIGH (ref 11.5–15.5)
WBC: 8.2 10*3/uL (ref 4.0–10.5)

## 2018-10-27 MED ORDER — BUDESONIDE-FORMOTEROL FUMARATE 160-4.5 MCG/ACT IN AERO: 2.0000 | INHALATION_SPRAY | Freq: Two times a day (BID) | RESPIRATORY_TRACT | 0 refills | Status: DC

## 2018-10-27 NOTE — Patient Instructions (Signed)
Schedule you for pulmonary function test and high-resolution CT of the chest for complete evaluation of your lung We will check some labs today including CBC with differential, IgE and alpha-1 antitrypsin levels and phenotype We will start you on an inhaler called Symbicort 160 Follow-up in 2 to 4 weeks.

## 2018-10-27 NOTE — Progress Notes (Signed)
[name]    [id number]    1946-01-23  Primary Care Physician:Corey, Rebekah Chesterfield, MD  Referring Physician: Gregor Hams, MD [address] [place], [place] [address]  Chief complaint: Consult for dyspnea  HPI: 73 year old with history of atrial flutter, coronary artery disease, diastolic dysfunction, hypertension, OSA. Here for evaluation of dyspnea on exertion.  Has had issues for the past 10 years with no worsening of symptoms.  Has chronic cough with white mucus, chest congestion.  History noted for coronary artery disease, atrial flutter.  He was on amiodarone from 2009 -2011. This was stopped at [facility] after he had abnormal PFTs and elevated LFTs   Pets: No pets Occupation: Retired Programme researcher, broadcasting/film/video at [employer]. Exposures: No known exposures, no mold, hot tub, Jacuzzi, humidifier Smoking history: 45-pack-year smoker.  Quit in 1996.  Continues to smoke cigars occasionally Travel history: Previously lived in [place].  No significant recent travel Relevant family history: No significant family history of lung disease.  Outpatient Encounter Medications as of 10/27/2018  Medication Sig  . acetaminophen (TYLENOL) 500 MG tablet Take 1,000 mg every 6 (six) hours as needed by mouth (for pain.).  [name] atorvastatin (LIPITOR) 10 MG tablet TAKE 1 TABLET(10 MG) BY MOUTH DAILY  . cetirizine (ZYRTEC) 10 MG tablet Take 10 mg by mouth daily.  . empagliflozin (JARDIANCE) 10 MG TABS tablet Take 10 mg by mouth daily.  [name] gabapentin (NEURONTIN) 300 MG capsule TAKE 2 CAPSULES(600 MG) BY MOUTH AT BEDTIME  . lisinopril-hydrochlorothiazide (PRINZIDE,ZESTORETIC) 10-12.5 MG tablet TAKE 1 TABLET BY MOUTH DAILY  . metoprolol succinate (TOPROL-XL) 100 MG 24 hr tablet TAKE 1 TABLET(100 MG) BY MOUTH DAILY  . MULTAQ 400 MG tablet TAKE 1 TABLET BY MOUTH TWICE DAILY WITH MEAL  . Multiple Vitamin (MULTIVITAMIN WITH MINERALS) TABS tablet Take 1 tablet by mouth every other day.   . niacin (NIASPAN)  1000 MG CR tablet TAKE 1 TABLET(1000 MG) BY MOUTH AT BEDTIME  . NON FORMULARY CPAP at night  . rivaroxaban (XARELTO) 20 MG TABS tablet TAKE 1 TABLET BY MOUTH EVERY DAY WITH SUPPER  . traMADol (ULTRAM) 50 MG tablet TAKE 1 TABLET BY MOUTH EVERY 12 HOURS AS NEEDED  . traZODone (DESYREL) 50 MG tablet Take 0.5-1 tablets (25-50 mg total) by mouth at bedtime as needed for sleep.  . VENTOLIN HFA 108 (90 Base) MCG/ACT inhaler INHALE 2 PUFFS INTO THE LUNGS EVERY 6 HOURS AS NEEDED FOR WHEEZING OR SHORTNESS OF BREATH  . furosemide (LASIX) 40 MG tablet Take 1 tablet (40 mg total) by mouth as needed.   No facility-administered encounter medications on file as of 10/27/2018.     Allergies as of 10/27/2018  . (No Known Allergies)    Past Medical History:  Diagnosis Date  . Atypical atrial flutter ([place])   . Coronary artery disease   . Diastolic dysfunction, left ventricle 05/2018   Dr. Rayann Heman- Cardiologist  . HLD (hyperlipidemia)   . HTN (hypertension)   . OSA (obstructive sleep apnea)    uses CPAP  . Peripheral artery disease ([place])    pseudoaneurysm post afib ablation at [facility] 2011, s/p bilateral iliac stents  . Persistent atrial fibrillation   . Pre-diabetes   . Renal artery stenosis ([facility])    right renal artery PTA and stenting  . S/P coronary artery bypass graft x 7 11/16/96    Past Surgical History:  Procedure Laterality Date  . 2-D echocardiogram  03/25/2010  Normal left ventricular function. Mild MR, TR, trivial AR  . ATRIAL FIBRILLATION ABLATION  03/27/2010, 12/31/2010   [facility], Dr. Nadeen Landau  . BACK SURGERY  2007  . cardiac stress test  11/21/2009   Exercise capacity 5 METS. No significant ischemia demonstrated  . CARDIOVERSION N/A 03/15/2015   Procedure: CARDIOVERSION;  Surgeon: Lorretta Harp, MD;  Location: [facility] ENDOSCOPY;  Service: Cardiovascular;  Laterality: N/A;  . CARDIOVERSION N/A 07/10/2017   Procedure: CARDIOVERSION;  Surgeon: Pixie Casino, MD;  Location: [facility] ENDOSCOPY;   Service: Cardiovascular;  Laterality: N/A;  . CARDIOVERSION N/A 03/23/2018   Procedure: CARDIOVERSION;  Surgeon: Sanda Klein, MD;  Location: [facility] ENDOSCOPY;  Service: Cardiovascular;  Laterality: N/A;  . CORONARY ARTERY BYPASS GRAFT  1998  . ORCHIECTOMY  1981   for cancer  . TOOTH EXTRACTION  05/27/13   tooth extraction with bone graft    Family History  Problem Relation Age of Onset  . Cancer Mother   . Cancer Father   . Other Brother        NO MEDICAL PROBLEMS  . Other Son        NO MEDICAL PROBLEMS  . Leukemia Daughter 6       Recover and has no other problems    Social History   Socioeconomic History  . Marital status: Divorced    Spouse name: Not on file  . Number of children: 2  . Years of education: 67  . Highest education level: Associate degree: academic program  Occupational History  . Occupation: retired    Comment: Community education officer of [employer]  Social Needs  . Financial resource strain: Not hard at all  . Food insecurity:    Worry: Never true    Inability: Never true  . Transportation needs:    Medical: No    Non-medical: No  Tobacco Use  . Smoking status: Former Smoker    Packs/day: 1.50    Years: 30.00    Pack years: 45.00    Types: Cigarettes, Cigars    Last attempt to quit: 09/01/1994    Years since quitting: 24.1  . Smokeless tobacco: Never Used  . Tobacco comment: currently smokes a cigar occ-10/27/18  Substance and Sexual Activity  . Alcohol use: Yes    Alcohol/week: 1.0 - 2.0 standard drinks    Types: 1 - 2 Glasses of wine per week    Comment: occasional  . Drug use: No  . Sexual activity: Yes  Lifestyle  . Physical activity:    Days per week: 2 days    Minutes per session: 10 min  . Stress: Not at all  Relationships  . Social connections:    Talks on phone: More than three times a week    Gets together: Once a week    Attends religious service: Never    Active member of club or organization: No    Attends meetings of clubs or  organizations: Never    Relationship status: Divorced  . Intimate partner violence:    Fear of current or ex partner: No    Emotionally abused: No    Physically abused: No    Forced sexual activity: No  Other Topics Concern  . Not on file  Social History Narrative   Pt lives in [place] alone.  Divorced. 2 cups of coffee in the morning.   Retired from [employer])    Review of systems: Review of Systems  Constitutional: Negative for fever and chills.  HENT:  Negative.   Eyes: Negative for blurred vision.  Respiratory: as per HPI  Cardiovascular: Negative for chest pain and palpitations.  Gastrointestinal: Negative for vomiting, diarrhea, blood per rectum. Genitourinary: Negative for dysuria, urgency, frequency and hematuria.  Musculoskeletal: Negative for myalgias, back pain and joint pain.  Skin: Negative for itching and rash.  Neurological: Negative for dizziness, tremors, focal weakness, seizures and loss of consciousness.  Endo/Heme/Allergies: Negative for environmental allergies.  Psychiatric/Behavioral: Negative for depression, suicidal ideas and hallucinations.  All other systems reviewed and are negative.  Physical Exam: Blood pressure 124/64, pulse 79, height 5\' 9"  (1.753 m), weight 225 lb 3.2 oz (102.2 kg), SpO2 94 %. Gen:      No acute distress HEENT:  EOMI, sclera anicteric Neck:     No masses; no thyromegaly Lungs:    Clear to auscultation bilaterally; normal respiratory effort CV:         Regular rate and rhythm; no murmurs Abd:      + bowel sounds; soft, non-tender; no palpable masses, no distension Ext:    No edema; adequate peripheral perfusion Skin:      Warm and dry; no rash Neuro: alert and oriented x 3 Psych: normal mood and affect  Data Reviewed: Imaging: Chest x-ray 09/15/2018-cardiomegaly, mild CHF chronic changes.  No active disease.  PFTs: 09/22/2008 FVC 4.62 [78%], FEV1 3.26 [88%, F/F 76, SVC 3.62 [98%), DLCO 31.61  [98%]  Spirometry [facility] 08/29/2010 FVC 3 [59%], FEV1 2.2 [56%], F/F 73  Spirometry 09/29/2018 FVC 2.62 (60%), FEV1 1.74 (56%), F/F 66 Moderate obstruction.  Cardiac: Echocardiogram 05/13/2018- mild LVH, LVEF 36-62%, grade 2 diastolic dysfunction.  PA systolic pressure 48 mm.  Assessment:  Consult for dyspnea Spirometry shows moderate obstruction.  Suspect he has COPD from smoking He may also have a restrictive process that is worsening from 2010.  Do not feel this is due to amiodarone as he has been off the medication since 2011.  Recent chest x-ray shows some chronic interstitial markings  Will reevaluate with full PFTs, high-resolution CT Start him on Symbicort inhaler Check CBC differential, IgE and alpha-1 antitrypsin levels and phenotype.  Plan/Recommendations: - PFTs and CT chest - CBC, IgE, alpha-1 antitrypsin - Start Symbicort  [name] MD [facility] 10/27/2018, 10:44 AM  CC: Gregor Hams, MD

## 2018-10-29 DIAGNOSIS — J849 Interstitial pulmonary disease, unspecified: Secondary | ICD-10-CM

## 2018-10-29 MED ORDER — BUDESONIDE-FORMOTEROL FUMARATE 160-4.5 MCG/ACT IN AERO: 2.0000 | INHALATION_SPRAY | Freq: Two times a day (BID) | RESPIRATORY_TRACT | 5 refills | Status: DC

## 2018-11-06 ENCOUNTER — Other Ambulatory Visit (HOSPITAL_COMMUNITY): Payer: Self-pay | Admitting: Nurse Practitioner

## 2018-11-06 ENCOUNTER — Other Ambulatory Visit: Payer: Self-pay | Admitting: Family Medicine

## 2018-11-09 DIAGNOSIS — G4733 Obstructive sleep apnea (adult) (pediatric): Secondary | ICD-10-CM | POA: Diagnosis not present

## 2018-11-09 LAB — IGE: IgE (Immunoglobulin E), Serum: 26 kU/L (ref <=114)

## 2018-11-09 LAB — ALPHA-1 ANTITRYPSIN PHENOTYPE: A-1 Antitrypsin, Ser: 166 mg/dL (ref 83–199)

## 2018-11-16 ENCOUNTER — Ambulatory Visit (INDEPENDENT_AMBULATORY_CARE_PROVIDER_SITE_OTHER): Payer: Medicare Other | Admitting: Pulmonary Disease

## 2018-11-16 ENCOUNTER — Other Ambulatory Visit: Payer: Self-pay

## 2018-11-16 DIAGNOSIS — J849 Interstitial pulmonary disease, unspecified: Secondary | ICD-10-CM | POA: Diagnosis not present

## 2018-11-16 LAB — PULMONARY FUNCTION TEST
DL/VA % pred: 89 %
DL/VA: 3.63 ml/min/mmHg/L
DLCO cor % pred: 75 %
DLCO cor: 19.54 ml/min/mmHg
DLCO unc % pred: 76 %
DLCO unc: 19.81 ml/min/mmHg
FEF 25-75 Post: 1.43 L/sec
FEF 25-75 Pre: 1.51 L/sec
FEF2575-%Change-Post: -5 %
FEF2575-%Pred-Post: 57 %
FEF2575-%Pred-Pre: 61 %
FEV1-%Change-Post: 0 %
FEV1-%Pred-Post: 71 %
FEV1-%Pred-Pre: 72 %
FEV1-Post: 2.33 L
FEV1-Pre: 2.35 L
FEV1FVC-%Change-Post: 0 %
FEV1FVC-%Pred-Pre: 96 %
FEV6-%Change-Post: 1 %
FEV6-%Pred-Post: 78 %
FEV6-%Pred-Pre: 76 %
FEV6-Post: 3.27 L
FEV6-Pre: 3.21 L
FEV6FVC-%Change-Post: 0 %
FEV6FVC-%Pred-Post: 105 %
FEV6FVC-%Pred-Pre: 104 %
FVC-%Change-Post: 0 %
FVC-%Pred-Post: 74 %
FVC-%Pred-Pre: 74 %
FVC-Post: 3.29 L
FVC-Pre: 3.31 L
Post FEV1/FVC ratio: 71 %
Post FEV6/FVC ratio: 99 %
Pre FEV1/FVC ratio: 71 %
Pre FEV6/FVC Ratio: 98 %
RV % pred: 116 %
RV: 2.86 L
TLC % pred: 85 %
TLC: 6.03 L

## 2018-11-16 NOTE — Progress Notes (Signed)
PFT done today. 

## 2018-11-18 ENCOUNTER — Other Ambulatory Visit: Payer: Self-pay

## 2018-11-18 ENCOUNTER — Ambulatory Visit (INDEPENDENT_AMBULATORY_CARE_PROVIDER_SITE_OTHER)
Admission: RE | Admit: 2018-11-18 | Discharge: 2018-11-18 | Disposition: A | Discharge status: Home / Self Care | Payer: Medicare Other | Source: Ambulatory Visit | Attending: Pulmonary Disease | Admitting: Pulmonary Disease

## 2018-11-18 DIAGNOSIS — J849 Interstitial pulmonary disease, unspecified: Secondary | ICD-10-CM | POA: Diagnosis not present

## 2018-11-18 DIAGNOSIS — R918 Other nonspecific abnormal finding of lung field: Secondary | ICD-10-CM | POA: Diagnosis not present

## 2018-11-21 ENCOUNTER — Encounter: Payer: Self-pay | Admitting: Family Medicine

## 2018-11-24 ENCOUNTER — Ambulatory Visit: Payer: Medicare Other | Admitting: Pulmonary Disease

## 2018-11-24 ENCOUNTER — Other Ambulatory Visit: Payer: Self-pay

## 2018-11-24 ENCOUNTER — Telehealth (INDEPENDENT_AMBULATORY_CARE_PROVIDER_SITE_OTHER): Payer: Medicare Other | Admitting: Pulmonary Disease

## 2018-11-24 DIAGNOSIS — J449 Chronic obstructive pulmonary disease, unspecified: Secondary | ICD-10-CM | POA: Diagnosis not present

## 2018-11-24 DIAGNOSIS — J849 Interstitial pulmonary disease, unspecified: Secondary | ICD-10-CM

## 2018-11-24 NOTE — Patient Instructions (Signed)
Glad you are doing well with your breathing We will order follow-up high-resolution CT in 1 year Continue your inhalers as prescribed Follow-up in 6 months.

## 2018-11-24 NOTE — Progress Notes (Signed)
Virtual Visit via Telephone Note  I connected with [name] on 11/24/18 at 10:00 AM EDT by telephone and verified that I am speaking with the correct person using two identifiers.   I discussed the limitations, risks, security and privacy concerns of performing an evaluation and management service by telephone and the availability of in person appointments. I also discussed with the patient that there may be a patient responsible charge related to this service. The patient expressed understanding and agreed to proceed.  History of Present Illness: 73 year old with history of atrial flutter, coronary artery disease, diastolic dysfunction, hypertension, OSA. Here for evaluation of dyspnea on exertion.  Has had issues for the past 10 years with no worsening of symptoms.  Has chronic cough with white mucus, chest congestion.  History noted for coronary artery disease, atrial flutter.  He was on amiodarone from 2009 -2011. This was stopped at [facility] after he had abnormal PFTs and elevated LFTs    Observations/Objective: Patient is feeling well.  Currently staying at home to avoid exposure to coronavirus Continues on Symbicort which is helping with his breathing.  No complains of fever, dyspnea  Reviewed test results including PFTs, labs and CT scan  PFTs 11/16/2026 FVC 3.29 [94%], FEV1 2.33 [100%] F/F 71, TLC 85%, DLCO 76% Moderate obstruction with minimal diffusion defect  CT scan 11/18/2018-very minimal reticulation changes at the bases.  No evidence of traction bronchiectasis, honeycombing.  Aortic atherosclerosis, mild emphysematous changes, cirrhosis  Labs Alpha 1 antitrypsin 10/27/2018-166, PI MM,  IgE 10/27/2018- 26 CBC 10/27/2018- WBC 8.2, eos 6.5%, absolute eos count 533  Assessment and Plan: CT scan reviewed with no clear evidence of pulmonary fibrosis We will continue to monitor this with a CT scan in 1 year Continue Symbicort inhaler, albuterol as needed  Follow Up  Instructions: Continue inhalers CT in 1 year Follow-up in clinic in 6 months   I discussed the assessment and treatment plan with the patient. The patient was provided an opportunity to ask questions and all were answered. The patient agreed with the plan and demonstrated an understanding of the instructions.   The patient was advised to call back or seek an in-person evaluation if the symptoms worsen or if the condition fails to improve as anticipated.  I provided 20 minutes of non-face-to-face time during this encounter.  [name] MD [facility] 11/24/2018, 9:40 AM

## 2018-11-24 NOTE — Addendum Note (Signed)
Addended by: Elton Sin on: 11/24/2018 09:53 AM   Modules accepted: Orders

## 2018-12-15 ENCOUNTER — Telehealth: Payer: Self-pay | Admitting: Cardiovascular Disease

## 2018-12-15 NOTE — Telephone Encounter (Signed)
LVM to schedule sleep clinic appt in recalls.

## 2018-12-16 ENCOUNTER — Encounter (HOSPITAL_COMMUNITY): Payer: Self-pay

## 2018-12-17 ENCOUNTER — Encounter: Payer: Self-pay | Admitting: Family Medicine

## 2018-12-17 ENCOUNTER — Telehealth (HOSPITAL_COMMUNITY): Payer: Self-pay | Admitting: *Deleted

## 2018-12-17 NOTE — Telephone Encounter (Signed)
Confirmed with patient he is taking trazodone nightly for sleep. He prefers to go ahead and have an ekg performed to ensure safe to continue as it has helped him sleep well. Appt made. No known exposure to covid in last 14 days.

## 2018-12-17 NOTE — Telephone Encounter (Signed)
-----   Message from Leeroy Bock, [facility] sent at 12/16/2018  3:33 PM EDT ----- Regarding: RE: med interaction? Trazodone is QTc prolonging which is why the interaction is flagging - pt does have a lower dose of trazodone on his list and he seems to be taking it prn for sleep. Would see how frequently pt is using his trazodone; if he's' using it almost nightly he should come in for an EKG since we don't have one on file since he started the trazodone in January.  Thanks, Jinny Blossom ----- Message ----- From: Juluis Mire, RN Sent: 12/16/2018   2:54 PM EDT To: Leeroy Bock, RPH Subject: med interaction?                               We received a fax stating interaction between trazodone and multaq. Looks like he's been on this together since January of this year by his PCP. Is this a true interaction? Butch Penny wanted your input. ThankStacy

## 2018-12-17 NOTE — Telephone Encounter (Signed)
-----   Message from Leeroy Bock, [facility] sent at 12/16/2018  3:33 PM EDT ----- Regarding: RE: med interaction? Trazodone is QTc prolonging which is why the interaction is flagging - pt does have a lower dose of trazodone on his list and he seems to be taking it prn for sleep. Would see how frequently pt is using his trazodone; if he's' using it almost nightly he should come in for an EKG since we don't have one on file since he started the trazodone in January.  Thanks, Jinny Blossom ----- Message ----- From: Juluis Mire, RN Sent: 12/16/2018   2:54 PM EDT To: Leeroy Bock, RPH Subject: med interaction?                               We received a fax stating interaction between trazodone and multaq. Looks like he's been on this together since January of this year by his PCP. Is this a true interaction? Butch Penny wanted your input. ThankStacy

## 2018-12-22 ENCOUNTER — Ambulatory Visit: Payer: Medicare Other | Admitting: Family Medicine

## 2018-12-23 ENCOUNTER — Other Ambulatory Visit: Payer: Self-pay

## 2018-12-23 ENCOUNTER — Ambulatory Visit (HOSPITAL_COMMUNITY)
Admission: RE | Admit: 2018-12-23 | Discharge: 2018-12-23 | Disposition: A | Discharge status: Home / Self Care | Payer: Medicare Other | Source: Ambulatory Visit | Attending: Physician Assistant | Admitting: Physician Assistant

## 2018-12-23 ENCOUNTER — Encounter (HOSPITAL_COMMUNITY): Payer: Self-pay | Admitting: Physician Assistant

## 2018-12-23 VITALS — BP 126/64 | HR 63

## 2018-12-23 DIAGNOSIS — I480 Paroxysmal atrial fibrillation: Secondary | ICD-10-CM | POA: Insufficient documentation

## 2018-12-23 NOTE — Progress Notes (Addendum)
Pt in for EKG to check QT interval since on Multaq and Trazodone.  To be reviewed by Malka So, PA  Patient returns for ECG to evaluate QT interval. ECG shows SR HR 63, RBBB, PR 138, QRS 140, QTc 478. QT stable on current medications. No changes today. Routine follow up in AF clinic.

## 2018-12-30 ENCOUNTER — Other Ambulatory Visit: Payer: Self-pay | Admitting: Family Medicine

## 2018-12-30 ENCOUNTER — Other Ambulatory Visit: Payer: Self-pay | Admitting: Nurse Practitioner

## 2018-12-30 DIAGNOSIS — I480 Paroxysmal atrial fibrillation: Secondary | ICD-10-CM

## 2019-01-10 ENCOUNTER — Encounter (INDEPENDENT_AMBULATORY_CARE_PROVIDER_SITE_OTHER): Payer: Medicare Other | Admitting: Family Medicine

## 2019-01-10 DIAGNOSIS — I480 Paroxysmal atrial fibrillation: Secondary | ICD-10-CM

## 2019-01-10 DIAGNOSIS — I152 Hypertension secondary to endocrine disorders: Secondary | ICD-10-CM

## 2019-01-10 DIAGNOSIS — D509 Iron deficiency anemia, unspecified: Secondary | ICD-10-CM | POA: Diagnosis not present

## 2019-01-10 DIAGNOSIS — E1122 Type 2 diabetes mellitus with diabetic chronic kidney disease: Secondary | ICD-10-CM

## 2019-01-10 DIAGNOSIS — Z20822 Contact with and (suspected) exposure to covid-19: Secondary | ICD-10-CM

## 2019-01-10 DIAGNOSIS — E785 Hyperlipidemia, unspecified: Secondary | ICD-10-CM

## 2019-01-10 DIAGNOSIS — N183 Chronic kidney disease, stage 3 (moderate): Secondary | ICD-10-CM

## 2019-01-10 DIAGNOSIS — I110 Hypertensive heart disease with heart failure: Secondary | ICD-10-CM

## 2019-01-10 DIAGNOSIS — E1169 Type 2 diabetes mellitus with other specified complication: Secondary | ICD-10-CM | POA: Diagnosis not present

## 2019-01-10 DIAGNOSIS — Z20828 Contact with and (suspected) exposure to other viral communicable diseases: Secondary | ICD-10-CM

## 2019-01-10 DIAGNOSIS — E1159 Type 2 diabetes mellitus with other circulatory complications: Secondary | ICD-10-CM

## 2019-01-10 DIAGNOSIS — N1831 Chronic kidney disease, stage 3a: Secondary | ICD-10-CM

## 2019-01-10 DIAGNOSIS — I10 Essential (primary) hypertension: Secondary | ICD-10-CM

## 2019-01-11 ENCOUNTER — Encounter: Payer: Self-pay | Admitting: Family Medicine

## 2019-01-11 NOTE — Telephone Encounter (Signed)
Patient sent me messages with several questions including question about coronavirus antibody testing as well as N-acetylcysteine prophylaxis. He had possible exposure earlier this spring.  It is reasonable to proceed with antibiotic testing.  Additionally he is due for hemoglobin A1c as well as metabolic panel testing to follow-up his diabetes and kidney disease.  Will check direct LDL as well.  Recommend patient schedule follow-up visit with me in the near future after 24 hours at least.  Total time spent 11 minutes.

## 2019-01-21 ENCOUNTER — Ambulatory Visit: Payer: Medicare Other | Admitting: Family Medicine

## 2019-01-22 ENCOUNTER — Other Ambulatory Visit: Payer: Self-pay | Admitting: Family Medicine

## 2019-01-25 ENCOUNTER — Inpatient Hospital Stay (HOSPITAL_COMMUNITY): Admission: RE | Admit: 2019-01-25 | Payer: Medicare Other | Source: Ambulatory Visit | Admitting: Nurse Practitioner

## 2019-01-25 DIAGNOSIS — E1122 Type 2 diabetes mellitus with diabetic chronic kidney disease: Secondary | ICD-10-CM | POA: Diagnosis not present

## 2019-01-25 DIAGNOSIS — E1159 Type 2 diabetes mellitus with other circulatory complications: Secondary | ICD-10-CM | POA: Diagnosis not present

## 2019-01-25 DIAGNOSIS — E1169 Type 2 diabetes mellitus with other specified complication: Secondary | ICD-10-CM | POA: Diagnosis not present

## 2019-01-25 DIAGNOSIS — I110 Hypertensive heart disease with heart failure: Secondary | ICD-10-CM | POA: Diagnosis not present

## 2019-01-26 ENCOUNTER — Encounter: Payer: Self-pay | Admitting: Family Medicine

## 2019-01-26 LAB — COMPLETE METABOLIC PANEL WITH GFR
AG Ratio: 1.4 (calc) (ref 1.0–2.5)
ALT: 14 U/L (ref 9–46)
AST: 19 U/L (ref 10–35)
Albumin: 4.2 g/dL (ref 3.6–5.1)
Alkaline phosphatase (APISO): 73 U/L (ref 35–144)
BUN/Creatinine Ratio: 11 (calc) (ref 6–22)
BUN: 13 mg/dL (ref 7–25)
CO2: 29 mmol/L (ref 20–32)
Calcium: 9.6 mg/dL (ref 8.6–10.3)
Chloride: 102 mmol/L (ref 98–110)
Creat: 1.2 mg/dL — ABNORMAL HIGH (ref 0.70–1.18)
GFR, Est African American: 71 mL/min/{1.73_m2} (ref >=60)
GFR, Est Non African American: 61 mL/min/{1.73_m2} (ref >=60)
Globulin: 3.1 g/dL (calc) (ref 1.9–3.7)
Glucose, Bld: 124 mg/dL (ref 65–139)
Potassium: 4.6 mmol/L (ref 3.5–5.3)
Sodium: 139 mmol/L (ref 135–146)
Total Bilirubin: 0.6 mg/dL (ref 0.2–1.2)
Total Protein: 7.3 g/dL (ref 6.1–8.1)

## 2019-01-26 LAB — HEMOGLOBIN A1C
Hgb A1c MFr Bld: 6.8 % of total Hgb — ABNORMAL HIGH (ref <5.7)
Mean Plasma Glucose: 148 (calc)
eAG (mmol/L): 8.2 (calc)

## 2019-01-26 LAB — SAR COV2 SEROLOGY (COVID19)AB(IGG),IA: SARS CoV2 AB IGG: NEGATIVE

## 2019-01-26 LAB — LDL CHOLESTEROL, DIRECT: Direct LDL: 48 mg/dL (ref <100)

## 2019-01-28 ENCOUNTER — Other Ambulatory Visit: Payer: Self-pay

## 2019-01-28 ENCOUNTER — Encounter (HOSPITAL_COMMUNITY): Payer: Self-pay | Admitting: Nurse Practitioner

## 2019-01-28 ENCOUNTER — Ambulatory Visit (HOSPITAL_COMMUNITY)
Admission: RE | Admit: 2019-01-28 | Discharge: 2019-01-28 | Disposition: A | Discharge status: Home / Self Care | Payer: Medicare Other | Source: Ambulatory Visit | Attending: Nurse Practitioner | Admitting: Nurse Practitioner

## 2019-01-28 ENCOUNTER — Encounter (HOSPITAL_COMMUNITY): Payer: Self-pay | Admitting: *Deleted

## 2019-01-28 VITALS — BP 118/63 | HR 61 | Ht 69.0 in

## 2019-01-28 DIAGNOSIS — I480 Paroxysmal atrial fibrillation: Secondary | ICD-10-CM

## 2019-01-28 NOTE — Progress Notes (Signed)
Electrophysiology TeleHealth Note   Due to national recommendations of social distancing due to [place] 19, Audio/video telehealth visit is felt to be most appropriate for this patient at this time.  See MyChart message/consent below from today for patient consent regarding telehealth for the [facility].    Date:  01/28/2019   ID:  [name], DOB November 03, 1945, MRN [id number]  Location: home  Provider location: [address] [place], [place] [address] Evaluation Performed:Follow up  PCP:  Gregor Hams, MD  Primary Cardiologist:  Dr. Rayann Heman Primary Electrophysiologist: Dr. Rayann Heman  CC: PAF   History of Present Illness: [name] is a 73 y.o. male who presents via audio/video conferencing for a telehealth visit today.    Pt reports no major issues with afib. Will feel his HR pick up at times but can get it back in rhythm in a matter of minutes with deep breaths. He had a prolonged URI in December, was tested for covid antibodies recently and was negative. He is having to use prn lasix once a week and will lose 3-4 lbs but has gotten into the habit of eating pretzels daily. He is now able to get out and golf and he is happy about that. He is taking covid precautions.  Today, he denies symptoms of palpitations, chest pain, shortness of breath, orthopnea, PND, lower extremity edema, claudication, dizziness, presyncope, syncope, bleeding, or neurologic sequela. The patient is tolerating medications without difficulties and is otherwise without complaint today.   he denies symptoms of cough, fevers, chills, or new SOB worrisome for COVID 19.   he has a BMI of There is no height or weight on file to calculate BMI.. There were no vitals filed for this visit.  Past Medical History:  Diagnosis Date  . Atypical atrial flutter ([place])   . Coronary artery disease   . Diastolic dysfunction, left ventricle 05/2018   Dr. Rayann Heman- Cardiologist  . HLD (hyperlipidemia)   .  HTN (hypertension)   . OSA (obstructive sleep apnea)    uses CPAP  . Peripheral artery disease ([place])    pseudoaneurysm post afib ablation at [facility] 2011, s/p bilateral iliac stents  . Persistent atrial fibrillation   . Pre-diabetes   . Renal artery stenosis ([facility])    right renal artery PTA and stenting  . S/P coronary artery bypass graft x 7 11/16/96   Past Surgical History:  Procedure Laterality Date  . 2-D echocardiogram  03/25/2010   Normal left ventricular function. Mild MR, TR, trivial AR  . ATRIAL FIBRILLATION ABLATION  03/27/2010, 12/31/2010   [facility], Dr. Nadeen Landau  . BACK SURGERY  2007  . cardiac stress test  11/21/2009   Exercise capacity 5 METS. No significant ischemia demonstrated  . CARDIOVERSION N/A 03/15/2015   Procedure: CARDIOVERSION;  Surgeon: Lorretta Harp, MD;  Location: [facility] ENDOSCOPY;  Service: Cardiovascular;  Laterality: N/A;  . CARDIOVERSION N/A 07/10/2017   Procedure: CARDIOVERSION;  Surgeon: Pixie Casino, MD;  Location[facility]COPY;  Service: Cardiovascular;  Laterality: N/A;  . CARDIOVERSION N/A 03/23/2018   Procedure: CARDIOVERSION;  Surgeon: Sanda Klein, MD;  Location: [facility] ENDOSCOPY;  Service: Cardiovascular;  Laterality: N/A;  . CORONARY ARTERY BYPASS GRAFT  1998  . ORCHIECTOMY  1981   for cancer  . TOOTH EXTRACTION  05/27/13   tooth extraction with bone graft     Current Outpatient Medications  Medication Sig Dispense Refill  . acetaminophen (TYLENOL) 500 MG tablet Take 1,000 mg every  6 (six) hours as needed by mouth (for pain.).    [name] atorvastatin (LIPITOR) 10 MG tablet TAKE 1 TABLET(10 MG) BY MOUTH DAILY 90 tablet 3  . budesonide-formoterol (SYMBICORT) 160-4.5 MCG/ACT inhaler Inhale 2 puffs into the lungs 2 (two) times daily. 1 Inhaler 5  . cetirizine (ZYRTEC) 10 MG tablet Take 10 mg by mouth daily.    . empagliflozin (JARDIANCE) 10 MG TABS tablet Take 10 mg by mouth daily. 90 tablet 1  . furosemide (LASIX) 40 MG tablet TAKE 1 TABLET BY MOUTH AS  NEEDED 30 tablet 3  . gabapentin (NEURONTIN) 300 MG capsule TAKE 2 CAPSULES BY MOUTH EVERY NIGHT AT BEDTIME 180 capsule 0  . lisinopril-hydrochlorothiazide (PRINZIDE,ZESTORETIC) 10-12.5 MG tablet TAKE 1 TABLET BY MOUTH DAILY 90 tablet 2  . metoprolol succinate (TOPROL-XL) 100 MG 24 hr tablet TAKE 1 TABLET(100 MG) BY MOUTH DAILY 90 tablet 3  . MULTAQ 400 MG tablet TAKE 1 TABLET BY MOUTH TWICE DAILY WITH MEAL 180 tablet 1  . Multiple Vitamin (MULTIVITAMIN WITH MINERALS) TABS tablet Take 1 tablet by mouth every other day.     . niacin (NIASPAN) 1000 MG CR tablet TAKE 1 TABLET(1000 MG) BY MOUTH AT BEDTIME 90 tablet 3  . NON FORMULARY CPAP at night    . rivaroxaban (XARELTO) 20 MG TABS tablet TAKE 1 TABLET BY MOUTH EVERY DAY WITH SUPPER 90 tablet 2  . traMADol (ULTRAM) 50 MG tablet TAKE 1 TABLET BY MOUTH EVERY 12 HOURS AS NEEDED 30 tablet 1  . traZODone (DESYREL) 50 MG tablet TAKE 1/2 TO 1 TABLET BY MOUTH AT BEDTIME AS NEEDED FOR SLEEP 30 tablet 3  . VENTOLIN HFA 108 (90 Base) MCG/ACT inhaler INHALE 2 PUFFS INTO THE LUNGS EVERY 6 HOURS AS NEEDED FOR WHEEZING OR SHORTNESS OF BREATH 18 g 1   No current facility-administered medications for this encounter.     Allergies:   Patient has no known allergies.   Social History:  The patient  reports that he quit smoking about 24 years ago. His smoking use included cigarettes and cigars. He has a 45.00 pack-year smoking history. He has never used smokeless tobacco. He reports current alcohol use of about 1.0 - 2.0 standard drinks of alcohol per week. He reports that he does not use drugs.   Family History:  The patient's  family history includes Cancer in his father and mother; Leukemia (age of onset: 10) in his daughter; Other in his brother and son.    ROS:  Please see the history of present illness.   All other systems are personally reviewed and negative.   Exam: Well appearing, alert and conversant, regular work of breathing,  good skin color   Recent Labs: 10/27/2018: Hemoglobin 15.1; Platelets 233.0 01/25/2019: ALT 14; BUN 13; Creat 1.20; Potassium 4.6; Sodium 139  personally reviewed    Other studies personally reviewed: epic records revewed     ASSESSMENT AND PLAN:  1. Paroxysmal  atrial fibrillation Donig well on multaq 400 mg bid Contnue toprol100 mg qd Continue xarelto 20 mg daily, no issues with bleedng    This patients CHA2DS2-VASc Score and unadjusted Ischemic Stroke Rate (% per year) is equal to 4.8 % stroke rate/year from a score of 4  Above score calculated as 1 point each if present [CHF, HTN, DM, Vascular=MI/PAD/Aortic Plaque, Age if 65-74, or Male] Above score calculated as 2 points each if present [Age > 75, or Stroke/TIA/TE]  2.Htn Stable BP today is 118/63 wth HR of 61  and regular    COVID screen The patient does not have any symptoms that suggest any further testing/ screening at this time.  Social distancing reinforced today.    Follow-up: 6 months  Current medicines are reviewed at length with the patient today.   The patient does not have concerns regarding his medicines.  The following changes were made today:  none  Labs/ tests ordered today include:  none No orders of the defined types were placed in this encounter.   Patient Risk:  after full review of this patients clinical status, I feel that they are at moderate risk at this time.   Today, I have spent 12 minutes with the patient with telehealth technology discussing afib and covid precautions   Signed, Roderic Palau NP  01/28/2019 11:08 AM  Af[facility] [address] [place], [place] [address] [phone]   I hereby voluntarily request, consent and authorize the [facility] and its employed or contracted physicians, physician assistants, nurse practitioners or other licensed health care professionals (the Practitioner), to provide me with telemedicine health care services (the  "Services") as deemed necessary by the treating Practitioner. I acknowledge and consent to receive the Services by the Practitioner via telemedicine. I understand that the telemedicine visit will involve communicating with the Practitioner through live audiovisual communication technology and the disclosure of certain medical information by electronic transmission. I acknowledge that I have been given the opportunity to request an in-person assessment or other available alternative prior to the telemedicine visit and am voluntarily participating in the telemedicine visit.   I understand that I have the right to withhold or withdraw my consent to the use of telemedicine in the course of my care at any time, without affecting my right to future care or treatment, and that the Practitioner or I may terminate the telemedicine visit at any time. I understand that I have the right to inspect all information obtained and/or recorded in the course of the telemedicine visit and may receive copies of available information for a reasonable fee.  I understand that some of the potential risks of receiving the Services via telemedicine include:   Delay or interruption in medical evaluation due to technological equipment failure or disruption;  Information transmitted may not be sufficient (e.g. poor resolution of images) to allow for appropriate medical decision making by the Practitioner; and/or  In rare instances, security protocols could fail, causing a breach of personal health information.   Furthermore, I acknowledge that it is my responsibility to provide information about my medical history, conditions and care that is complete and accurate to the best of my ability. I acknowledge that Practitioner's advice, recommendations, and/or decision may be based on factors not within their control, such as incomplete or inaccurate data provided by me or distortions of diagnostic images or specimens that may result from  electronic transmissions. I understand that the practice of medicine is not an exact science and that Practitioner makes no warranties or guarantees regarding treatment outcomes. I acknowledge that I will receive a copy of this consent concurrently upon execution via email to the email address I last provided but may also request a printed copy by calling the office of the [facility].  I understand that my insurance will be billed for this visit.   I have read or had this consent read to me.  I understand the contents of this consent, which adequately explains the benefits and risks of the Services being  provided via telemedicine.  I have been provided ample opportunity to ask questions regarding this consent and the Services and have had my questions answered to my satisfaction.  I give my informed consent for the services to be provided through the use of telemedicine in my medical care  By participating in this telemedicine visit I agree to the above.

## 2019-01-31 ENCOUNTER — Telehealth: Payer: Self-pay | Admitting: Cardiovascular Disease

## 2019-01-31 NOTE — Telephone Encounter (Signed)
Pt prefers phone visit but will do video/my chart/pre reg complete/consent obtained -- ttf

## 2019-02-02 ENCOUNTER — Telehealth: Payer: Medicare Other | Admitting: Cardiovascular Disease

## 2019-02-03 ENCOUNTER — Telehealth: Payer: Medicare Other | Admitting: Cardiovascular Disease

## 2019-02-04 ENCOUNTER — Telehealth (INDEPENDENT_AMBULATORY_CARE_PROVIDER_SITE_OTHER): Payer: Medicare Other | Admitting: Cardiovascular Disease

## 2019-02-04 ENCOUNTER — Encounter: Payer: Self-pay | Admitting: Cardiovascular Disease

## 2019-02-04 VITALS — BP 138/73 | HR 57 | Ht 69.0 in | Wt 226.0 lb

## 2019-02-04 DIAGNOSIS — I10 Essential (primary) hypertension: Secondary | ICD-10-CM

## 2019-02-04 DIAGNOSIS — J449 Chronic obstructive pulmonary disease, unspecified: Secondary | ICD-10-CM

## 2019-02-04 DIAGNOSIS — Z794 Long term (current) use of insulin: Secondary | ICD-10-CM

## 2019-02-04 DIAGNOSIS — E782 Mixed hyperlipidemia: Secondary | ICD-10-CM

## 2019-02-04 DIAGNOSIS — I480 Paroxysmal atrial fibrillation: Secondary | ICD-10-CM | POA: Diagnosis not present

## 2019-02-04 DIAGNOSIS — G4733 Obstructive sleep apnea (adult) (pediatric): Secondary | ICD-10-CM | POA: Diagnosis not present

## 2019-02-04 DIAGNOSIS — I4510 Unspecified right bundle-branch block: Secondary | ICD-10-CM

## 2019-02-04 DIAGNOSIS — E118 Type 2 diabetes mellitus with unspecified complications: Secondary | ICD-10-CM

## 2019-02-04 NOTE — Patient Instructions (Addendum)

## 2019-02-04 NOTE — Progress Notes (Signed)
Virtual Visit via Video Note   This visit type was conducted due to national recommendations for restrictions regarding the COVID-19 Pandemic (e.g. social distancing) in an effort to limit this patient's exposure and mitigate transmission in our community.  Due to his co-morbid illnesses, this patient is at least at moderate risk for complications without adequate follow up.  This format is felt to be most appropriate for this patient at this time.  All issues noted in this document were discussed and addressed.  A limited physical exam was performed with this format.  Please refer to the patient's chart for his consent to telehealth for [facility].   Date:  02/04/2019   ID:  [name], DOB Apr 29, 1946, MRN [id number]  Patient Location: Home Provider Location: Home  PCP:  Gregor Hams, MD  Cardiologist:  Shelva Majestic, MD (sleep), Dr. Gwenlyn Found Electrophysiologist:  Dr. Rayann Heman   Evaluation Performed:  Follow-Up Visit  Chief Complaint: 1 year follow-up for sleep apnea  History of Present Illness:    [name] is a 72 y.o. male who has a history of coronary artery disease and is status post CABG revascularization surgery 7 in March 1998.  He also has a history of PVD and is status post bilateral iliac artery PTA and stenting, right renal artery PTA and stenting and also has a history of hypertension, hyperlipidemia, diabetes mellitus, and paroxysmal atrial fibrillation.   He has a history of obstructive sleep apnea that was first diagnosed in 2009.  A diagnostic sleep study done at the [facility] on 03/22/2008 showed an AHI of 13.4 per hour.  However, during REM sleep  AHI was 27.8 per hour.  There was a positional component with supine sleep AHI at 38.7 versus nonsupine sleep at 9.9 per hour.  He had significant oxygen desaturation to 84%.  Over the last 9 years he has been using CPAP with 100% compliance.  He had an old Respironics REM Star Auto CPAP   machine. He machine malfunctioned and he was need for new machine.  I was able to interrogate machine that he brought with him to the office.  Over the past 30 days, his 30 day sleep averages 9 hours and 12 minutes.  AHI is 0.8.  He set at 14 cm water pressure.  He has been using a chronotropic fullface mask.  He typically goes to bed between 10 or 11 PM and wakes up around 7:30 AM.  He has been buying his supplies online.  He cannot sleep without his CPAP machine.  He has a special adapter for portable machine since in the past when he had a power outage he could not sleep at all without therapy.  He had recently undergone cardioversion for atrial fibrillation and was recently seen in the atrial fibrillation clinic and was in sinus rhythm.  An Epworth Sleepiness Scale score was calculated in the office when I last saw him in December 2018 and this endorsed at 3.  At that time, his machine was over 59 years old and he was in need for new machine.  He has transitioned to [employer] as his DME company and received a new ResMed AirSense 10 AutoSet CPAP machine on 09/09/2017.  I obtained a new download from March 9 through 12/06/2017.  He is 100% compliant and is averaging 8 hours and 37 minutes of sleep per night.  At a 14 cm water pressure.  AHI is excellent at 2.4.  He uses a fullface mask.  There is no leak.  He notices a dramatic improvement with his new machine.  Compared to his old machine.  He is sleeping well.  He denies residual snoring.  He denies daytime sleepiness.  Since I last saw him in April 2019, he underwent a cardioversion for recurrent atrial fibrillation in July 2019.  His heart rhythm has been stable ever since without recurrent AF.  Over the past year, he has continued to use CPAP with 100% compliance.  He had significant chest congestion and coughing in December and January of this year.  His pressures were increased to 17 cm at that time.  He was tested for COVID and was  negative.  A new download was obtained from a 7 through February 04, 2019.  This again shows 100% compliance.  Average sleep duration is 8 hours and 20 minutes with CPAP therapy.  At 17 cm water pressure AHI is excellent at 2.2/h.  There is no leak.  He has a  ResMed F30 full facemask.  The patient does not have symptoms concerning for COVID-19 infection (fever, chills, cough, or new shortness of breath).    Past Medical History:  Diagnosis Date  . Atypical atrial flutter ([place])   . Coronary artery disease   . Diastolic dysfunction, left ventricle 05/2018   Dr. Rayann Heman- Cardiologist  . HLD (hyperlipidemia)   . HTN (hypertension)   . OSA (obstructive sleep apnea)    uses CPAP  . Peripheral artery disease ([place])    pseudoaneurysm post afib ablation at [facility] 2011, s/p bilateral iliac stents  . Persistent atrial fibrillation   . Pre-diabetes   . Renal artery stenosis ([facility])    right renal artery PTA and stenting  . S/P coronary artery bypass graft x 7 11/16/96   Past Surgical History:  Procedure Laterality Date  . 2-D echocardiogram  03/25/2010   Normal left ventricular function. Mild MR, TR, trivial AR  . ATRIAL FIBRILLATION ABLATION  03/27/2010, 12/31/2010   [facility], Dr. Nadeen Landau  . BACK SURGERY  2007  . cardiac stress test  11/21/2009   Exercise capacity 5 METS. No significant ischemia demonstrated  . CARDIOVERSION N/A 03/15/2015   Procedure: CARDIOVERSION;  Surgeon: Lorretta Harp, MD;  Location[facility] END[facility]COPY;  Service: Cardiovascular;  Laterality: N/A;  . CARDIOVERSION N/A 07/10/2017   Procedure: CARDIOVERSION;  Surgeon: Pixie Casino, MD;  Location: [facility] ENDOSCOPY;  Service: Cardiovascular;  Laterality: N/A;  . CARDIOVERSION N/A 03/23/2018   Procedure: CARDIOVERSION;  Surgeon: Sanda Klein, MD;  Location: [facility] ENDOSCOPY;  Service: Cardiovascular;  Laterality: N/A;  . CORONARY ARTERY BYPASS GRAFT  1998  . ORCHIECTOMY  1981   for cancer  . TOOTH EXTRACTION  05/27/13   tooth extraction with  bone graft     Current Meds  Medication Sig  . acetaminophen (TYLENOL) 500 MG tablet Take 1,000 mg every 6 (six) hours as needed by mouth (for pain.).  [name] atorvastatin (LIPITOR) 10 MG tablet TAKE 1 TABLET(10 MG) BY MOUTH DAILY  . budesonide-formoterol (SYMBICORT) 160-4.5 MCG/ACT inhaler Inhale 2 puffs into the lungs 2 (two) times daily.  . cetirizine (ZYRTEC) 10 MG tablet Take 10 mg by mouth daily.  . empagliflozin (JARDIANCE) 10 MG TABS tablet Take 10 mg by mouth daily.  . furosemide (LASIX) 40 MG tablet TAKE 1 TABLET BY MOUTH AS NEEDED  . gabapentin (NEURONTIN) 300 MG capsule TAKE 2 CAPSULES BY MOUTH EVERY NIGHT AT BEDTIME  . lisinopril-hydrochlorothiazide (PRINZIDE,ZESTORETIC) 10-12.5 MG tablet  TAKE 1 TABLET BY MOUTH DAILY  . metoprolol succinate (TOPROL-XL) 100 MG 24 hr tablet TAKE 1 TABLET(100 MG) BY MOUTH DAILY  . MULTAQ 400 MG tablet TAKE 1 TABLET BY MOUTH TWICE DAILY WITH MEAL  . Multiple Vitamin (MULTIVITAMIN WITH MINERALS) TABS tablet Take 1 tablet by mouth every other day.   . niacin (NIASPAN) 1000 MG CR tablet TAKE 1 TABLET(1000 MG) BY MOUTH AT BEDTIME  . NON FORMULARY CPAP at night  . rivaroxaban (XARELTO) 20 MG TABS tablet TAKE 1 TABLET BY MOUTH EVERY DAY WITH SUPPER  . traMADol (ULTRAM) 50 MG tablet TAKE 1 TABLET BY MOUTH EVERY 12 HOURS AS NEEDED  . traZODone (DESYREL) 50 MG tablet TAKE 1/2 TO 1 TABLET BY MOUTH AT BEDTIME AS NEEDED FOR SLEEP  . VENTOLIN HFA 108 (90 Base) MCG/ACT inhaler INHALE 2 PUFFS INTO THE LUNGS EVERY 6 HOURS AS NEEDED FOR WHEEZING OR SHORTNESS OF BREATH     Allergies:   Patient has no known allergies.   Social History   Tobacco Use  . Smoking status: Former Smoker    Packs/day: 1.50    Years: 30.00    Pack years: 45.00    Types: Cigarettes, Cigars    Last attempt to quit: 09/01/1994    Years since quitting: 24.4  . Smokeless tobacco: Never Used  . Tobacco comment: currently smokes a cigar occ-10/27/18  Substance Use Topics  . Alcohol use: Yes     Alcohol/week: 1.0 - 2.0 standard drinks    Types: 1 - 2 Glasses of wine per week    Comment: occasional  . Drug use: No     Family Hx: The patient's family history includes Cancer in his father and mother; Leukemia (age of onset: 68) in his daughter; Other in his brother and son.  ROS:   Please see the history of present illness.    Currently no fevers chills or night sweats Significant chest congestion with shortness of breath in December 2019 -January 2020 No wheezing History of COPD/asthma Recurrent AF requiring cardioversion in July 2019; heart rhythm stable since without recurrence No chest discomfort No abdominal discomfort No leg swelling Sleeping well on his increased CPAP pressure at 17 cm.  No snoring on therapy;no residual daytime sleepiness.  All other systems reviewed and are negative.   Prior CV studies:   The following studies were reviewed today:  A new download was obtained from May 7 through February 04, 2019.  He is 1% compliant.  Average use 8 hours 20 minutes.  At 17 cm water pressure AHI 2.2.  No leak.  Labs/Other Tests and Data Reviewed:    EKG:  An ECG dated 12/23/2018 was personally reviewed today and demonstrated:  Normal sinus rhythm at 63 bpm, right bundle branch block with repolarization changes  Recent Labs: 10/27/2018: Hemoglobin 15.1; Platelets 233.0 01/25/2019: ALT 14; BUN 13; Creat 1.20; Potassium 4.6; Sodium 139   Recent Lipid Panel Lab Results  Component Value Date/Time   CHOL 99 05/27/2018 09:10 AM   TRIG 71 05/27/2018 09:10 AM   HDL 44 05/27/2018 09:10 AM   CHOLHDL 2.3 05/27/2018 09:10 AM   LDLCALC 40 05/27/2018 09:10 AM   LDLDIRECT 48 01/25/2019 10:49 AM    Wt Readings from Last 3 Encounters:  02/04/19 226 lb (102.5 kg)  10/27/18 225 lb 3.2 oz (102.2 kg)  09/29/18 227 lb (103 kg)     Objective:    Vital Signs:  BP 138/73   Pulse (!) 57  Ht 5\' 9"  (1.753 m)   Wt 226 lb (102.5 kg)   BMI 33.37 kg/m    He is well-developed and  well-nourished in no acute distress. HEENT was unchanged and unremarkable. Previous Mallampati scale was 3. No JVD No audible wheezing No chest wall tenderness to palpation No abdominal tenderness Rhythm regular No edema Neurologically grossly intact Normal cognition mood and affect  ASSESSMENT & PLAN:    1. OSA on CPAP therapy: Over the past 6 months he has been on a 17 cm set water pressure.  Download confirms excellent compliance with 100% use and average usage 8 hours and 20 minutes per night.  AHI is 2.2/h.  He has a ResMed F 30 full facemask.  There is no leak.  I discussed the new variant with the tubing coming from the top which would be a ResMed F 30i mask.  He is unaware of any breakthrough snoring.  He denies any residual daytime sleepiness. 2. PAF: Currently maintaining sinus rhythm.  He had a recurrent episode of atrial fibrillation requiring cardioversion in July 2019.  Anticoagulated with Xarelto 3. Chronic right bundle branch block: Stable 4. Essential hypertension: Currently on lisinopril HCT and Toprol-XL 100 mg.  He has a prescription for furosemide to take as needed for swelling 5. Mild obesity: I discussed the importance of weight loss and exercise at least 5 days/week for at least 30 minutes of moderate intensity if at all possible. 6. COPD/asthma: Currently on Symbicort and as needed Ventolin 7. Type 2 diabetes mellitus: On Jardiance 8. Hyperlipidemia: On atorvastatin.  COVID-19 Education: The signs and symptoms of COVID-19 were discussed with the patient and how to seek care for testing (follow up with PCP or arrange E-visit).  The importance of social distancing was discussed today.  Time:   Today, I have spent 25 minutes with the patient with telehealth technology discussing the above problems.     Medication Adjustments/Labs and Tests Ordered: Current medicines are reviewed at length with the patient today.  Concerns regarding medicines are outlined above.    Tests Ordered: No orders of the defined types were placed in this encounter.   Medication Changes: No orders of the defined types were placed in this encounter.   Disposition:  Follow up one year  Signed, Shelva Majestic, MD  02/04/2019 2:34 PM    [place]

## 2019-02-09 DIAGNOSIS — G4733 Obstructive sleep apnea (adult) (pediatric): Secondary | ICD-10-CM | POA: Diagnosis not present

## 2019-02-18 ENCOUNTER — Encounter: Payer: Self-pay | Admitting: Family Medicine

## 2019-02-21 ENCOUNTER — Telehealth: Payer: Self-pay | Admitting: Family Medicine

## 2019-02-21 NOTE — Telephone Encounter (Signed)
Left message that appointment was changed to tele-visit and for patient to call us if they have any concerns or questions.

## 2019-02-21 NOTE — Telephone Encounter (Signed)
-----   Message from Gregor Hams, MD sent at 02/21/2019  7:51 AM EDT ----- Regarding: Change upcoming visit to tele-visit Visit scheduled June 24.  Please change to tell a visit format.

## 2019-02-23 ENCOUNTER — Ambulatory Visit (INDEPENDENT_AMBULATORY_CARE_PROVIDER_SITE_OTHER): Payer: Medicare Other | Admitting: Family Medicine

## 2019-02-23 VITALS — BP 123/70 | HR 71 | Wt 226.0 lb

## 2019-02-23 DIAGNOSIS — I110 Hypertensive heart disease with heart failure: Secondary | ICD-10-CM

## 2019-02-23 DIAGNOSIS — I152 Hypertension secondary to endocrine disorders: Secondary | ICD-10-CM

## 2019-02-23 DIAGNOSIS — N183 Chronic kidney disease, stage 3 unspecified: Secondary | ICD-10-CM

## 2019-02-23 DIAGNOSIS — Z951 Presence of aortocoronary bypass graft: Secondary | ICD-10-CM

## 2019-02-23 DIAGNOSIS — E1122 Type 2 diabetes mellitus with diabetic chronic kidney disease: Secondary | ICD-10-CM

## 2019-02-23 DIAGNOSIS — G4733 Obstructive sleep apnea (adult) (pediatric): Secondary | ICD-10-CM

## 2019-02-23 DIAGNOSIS — I4510 Unspecified right bundle-branch block: Secondary | ICD-10-CM

## 2019-02-23 DIAGNOSIS — I739 Peripheral vascular disease, unspecified: Secondary | ICD-10-CM

## 2019-02-23 DIAGNOSIS — E1159 Type 2 diabetes mellitus with other circulatory complications: Secondary | ICD-10-CM | POA: Diagnosis not present

## 2019-02-23 DIAGNOSIS — I480 Paroxysmal atrial fibrillation: Secondary | ICD-10-CM

## 2019-02-23 DIAGNOSIS — I2510 Atherosclerotic heart disease of native coronary artery without angina pectoris: Secondary | ICD-10-CM

## 2019-02-23 DIAGNOSIS — E785 Hyperlipidemia, unspecified: Secondary | ICD-10-CM

## 2019-02-23 DIAGNOSIS — I2583 Coronary atherosclerosis due to lipid rich plaque: Secondary | ICD-10-CM

## 2019-02-23 DIAGNOSIS — I10 Essential (primary) hypertension: Secondary | ICD-10-CM

## 2019-02-23 DIAGNOSIS — N1831 Chronic kidney disease, stage 3a: Secondary | ICD-10-CM

## 2019-02-23 DIAGNOSIS — I701 Atherosclerosis of renal artery: Secondary | ICD-10-CM

## 2019-02-23 DIAGNOSIS — I272 Pulmonary hypertension, unspecified: Secondary | ICD-10-CM

## 2019-02-23 DIAGNOSIS — J449 Chronic obstructive pulmonary disease, unspecified: Secondary | ICD-10-CM

## 2019-02-23 DIAGNOSIS — E1169 Type 2 diabetes mellitus with other specified complication: Secondary | ICD-10-CM

## 2019-02-23 NOTE — Progress Notes (Signed)
Virtual Visit  I connected with      [name]  by a telemedicine application and verified that I am speaking with the correct person using two identifiers.   I discussed the limitations of evaluation and management by telemedicine and the availability of in person appointments. The patient expressed understanding and agreed to proceed.  History of Present Illness: [name] is a 73 y.o. male who would like to discuss back pain diabetes heart disease sleep apnea COPD.   Having some worsening back pain. Working with a Restaurant manager, fast food getting PT like treatment. Pain present now about 6 weeks. Having improvement. Had to reduce exercise.   Diabetes: Managed with Jardiance.  A1c 1 month ago was 6.8.  Cardiology: CAD PAD hypertension atrial fibrillation.  Managed with cardiology with medications listed below.  Creatinine was stable when checked 1 month ago.  Direct LDL is also well controlled at 48 one month ago.  OSA with CPAP.  Her percent compliance with 17 cm of water.  AHI 2.2.  COPD: Managed with Symbicort.  Patient also has as needed albuterol inhaler.  Observations/Objective: BP 123/70   Pulse 71   Wt 226 lb (102.5 kg)   BMI 33.37 kg/m  Wt Readings from Last 5 Encounters:  02/23/19 226 lb (102.5 kg)  02/04/19 226 lb (102.5 kg)  10/27/18 225 lb 3.2 oz (102.2 kg)  09/29/18 227 lb (103 kg)  09/15/18 226 lb (102.5 kg)   Exam: Normal Speech.  No shortness of breath or tachypnea.  Normal thought process.  Normal speech and verbal affect.  Lab and Radiology Results Recent Results (from the past 2160 hour(s))  SAR CoV2 Serology (COVID 19)AB(IGG)IA     Status: None   Collection Time: 01/25/19 10:49 AM   Specimen: Blood  Result Value Ref Range   SARS CoV2 AB IGG NEGATIVE     Comment: . Reference range: Negative . This test is intended for use as an aid in identifying individuals with an adaptive immune response to  SARS-CoV-2, indicating recent or prior  infection.  Results are for the detection of SARS-CoV-2 antibodies. IgG antibodies to SARS-CoV-2 are generally detectable in blood several days after initial infection, although the duration of time antibodies are present post-infection is not well characterized. At this time, it is unknown  for how long antibodies persist following infection and if the presence of antibodies confers protective  immunity. Individuals may have detectable virus by  molecular testing present for several weeks following  seroconversion. Negative results do not preclude acute SARS-CoV-2 infection. This test should not be used to diagnose acute SARS-CoV-2 infection. If acute infection is suspected, direct testing by molecular methods for  SARS-CoV-2 is necessary. False positive results for the test may occur due to cro ss-reactivity from pre-existing antibodies or other possible causes.  . Please review the "Fact Sheets" available for health  care providers and patients using the following websites:   [email or web address]-19/HCP/antibody/fact-sheet2  [facility]/home/Covid-19/Patients/antibody/fact-sheet2 . This test has been authorized by the FDA under an  Emergency Use Authorization (EUA) for use by authorized laboratories. The FDA authorized labeling is available on the [employer] website: www.[facility].com/Covid19.   . For additional information please refer to  http://education.[facility].com/faq/FAQ219 (This link is being provided for informational/educational purposes only.)      COMPLETE METABOLIC PANEL WITH GFR     Status: Abnormal   Collection Time: 01/25/19 10:49 AM  Result Value Ref Range   Glucose, Bld 124 65 - 139 mg/dL  Comment: .        Non-fasting reference interval .    BUN 13 7 - 25 mg/dL   Creat 1.20 (H) 0.70 - 1.18 mg/dL    Comment: For patients >75 years of age, the reference limit for Creatinine is approximately 13% higher for  people identified as African-American. .    GFR, Est Non African American 61 > OR = 60 mL/min/1.84m2   GFR, Est African American 71 > OR = 60 mL/min/1.52m2   BUN/Creatinine Ratio 11 6 - 22 (calc)   Sodium 139 135 - 146 mmol/L   Potassium 4.6 3.5 - 5.3 mmol/L   Chloride 102 98 - 110 mmol/L   CO2 29 20 - 32 mmol/L   Calcium 9.6 8.6 - 10.3 mg/dL   Total Protein 7.3 6.1 - 8.1 g/dL   Albumin 4.2 3.6 - 5.1 g/dL   Globulin 3.1 1.9 - 3.7 g/dL (calc)   AG Ratio 1.4 1.0 - 2.5 (calc)   Total Bilirubin 0.6 0.2 - 1.2 mg/dL   Alkaline phosphatase (APISO) 73 35 - 144 U/L   AST 19 10 - 35 U/L   ALT 14 9 - 46 U/L  Hemoglobin A1c     Status: Abnormal   Collection Time: 01/25/19 10:49 AM  Result Value Ref Range   Hgb A1c MFr Bld 6.8 (H) <5.7 % of total Hgb    Comment: For someone without known diabetes, a hemoglobin A1c value of 6.5% or greater indicates that they may have  diabetes and this should be confirmed with a follow-up  test. . For someone with known diabetes, a value <7% indicates  that their diabetes is well controlled and a value  greater than or equal to 7% indicates suboptimal  control. A1c targets should be individualized based on  duration of diabetes, age, comorbid conditions, and  other considerations. . Currently, no consensus exists regarding use of hemoglobin A1c for diagnosis of diabetes for children. .    Mean Plasma Glucose 148 (calc)   eAG (mmol/L) 8.2 (calc)  LDL cholesterol, direct     Status: None   Collection Time: 01/25/19 10:49 AM  Result Value Ref Range   Direct LDL 48 <100 mg/dL    Comment: Greatly elevated Triglycerides values (>1200 mg/dL) interfere with the dLDL assay. As no Triglycerides  testing was ordered, interpret results with caution. . Desirable range <100 mg/dL for primary prevention;   <70 mg/dL for patients with CHD or diabetic patients  with > or = 2 CHD risk factors. .      Assessment and Plan: 73 y.o. male with   Back pain:  Acute exacerbation.  Improving with chiropractic care.  Continue that and home exercise program recheck with me as needed.  Diabetes: Controlled.  Continue current regimen recheck in 3 to 4 months.  Heart  disease CAD PAD hypertension A. Fib: Doing well with current regimen.  Continue comanagement with cardiology.  OSA: Also controlled continue current regimen.  COPD: Doing well continue current regimen.   PDMP not reviewed this encounter. No orders of the defined types were placed in this encounter.  No orders of the defined types were placed in this encounter.   Follow Up Instructions:    I discussed the assessment and treatment plan with the patient. The patient was provided an opportunity to ask questions and all were answered. The patient agreed with the plan and demonstrated an understanding of the instructions.   The patient was advised to call back or  seek an in-person evaluation if the symptoms worsen or if the condition fails to improve as anticipated.  Time: 25 minutes of intraservice time, with >39 minutes of total time during today's visit.      Historical information moved to improve visibility of documentation.  Past Medical History:  Diagnosis Date  . Atypical atrial flutter ([place])   . Coronary artery disease   . Diastolic dysfunction, left ventricle 05/2018   Dr. Rayann Heman- Cardiologist  . HLD (hyperlipidemia)   . HTN (hypertension)   . OSA (obstructive sleep apnea)    uses CPAP  . Peripheral artery disease ([place])    pseudoaneurysm post afib ablation at [facility] 2011, s/p bilateral iliac stents  . Persistent atrial fibrillation   . Pre-diabetes   . Renal artery stenosis ([facility])    right renal artery PTA and stenting  . S/P coronary artery bypass graft x 7 11/16/96   Past Surgical History:  Procedure Laterality Date  . 2-D echocardiogram  03/25/2010   Normal left ventricular function. Mild MR, TR, trivial AR  . ATRIAL FIBRILLATION ABLATION  03/27/2010, 12/31/2010    [facility], Dr. Nadeen Landau  . BACK SURGERY  2007  . cardiac stress test  11/21/2009   Exercise capacity 5 METS. No significant ischemia demonstrated  . CARDIOVERSION N/A 03/15/2015   Procedure: CARDIOVERSION;  Surgeon: Lorretta Harp, MD;  Location[facility] ENDOSCOPY;  Service: Cardiovascular;  Laterality: N/A;  . CARDIOVERSION N/A 07/10/2017   Procedure: CARDIOVERSION;  Surgeon: Pixie Casino, MD;  Location: [facility] ENDOSCOPY;  Service: Cardiovascular;  Laterality: N/A;  . CARDIOVERSION N/A 03/23/2018   Procedure: CARDIOVERSION;  Surgeon: Sanda Klein, MD;  Location: [facility] ENDOSCOPY;  Service: Cardiovascular;  Laterality: N/A;  . CORONARY ARTERY BYPASS GRAFT  1998  . ORCHIECTOMY  1981   for cancer  . TOOTH EXTRACTION  05/27/13   tooth extraction with bone graft   Social History   Tobacco Use  . Smoking status: Former Smoker    Packs/day: 1.50    Years: 30.00    Pack years: 45.00    Types: Cigarettes, Cigars    Quit date: 09/01/1994    Years since quitting: 24.4  . Smokeless tobacco: Never Used  . Tobacco comment: currently smokes a cigar occ-10/27/18  Substance Use Topics  . Alcohol use: Yes    Alcohol/week: 1.0 - 2.0 standard drinks    Types: 1 - 2 Glasses of wine per week    Comment: occasional   family history includes Cancer in his father and mother; Leukemia (age of onset: 16) in his daughter; Other in his brother and son.  Medications: Current Outpatient Medications  Medication Sig Dispense Refill  . acetaminophen (TYLENOL) 500 MG tablet Take 1,000 mg every 6 (six) hours as needed by mouth (for pain.).    [name] atorvastatin (LIPITOR) 10 MG tablet TAKE 1 TABLET(10 MG) BY MOUTH DAILY 90 tablet 3  . budesonide-formoterol (SYMBICORT) 160-4.5 MCG/ACT inhaler Inhale 2 puffs into the lungs 2 (two) times daily. 1 Inhaler 5  . cetirizine (ZYRTEC) 10 MG tablet Take 10 mg by mouth daily.    . empagliflozin (JARDIANCE) 10 MG TABS tablet Take 10 mg by mouth daily. 90 tablet 1  . furosemide (LASIX) 40  MG tablet TAKE 1 TABLET BY MOUTH AS NEEDED 30 tablet 3  . gabapentin (NEURONTIN) 300 MG capsule TAKE 2 CAPSULES BY MOUTH EVERY NIGHT AT BEDTIME 180 capsule 0  . lisinopril-hydrochlorothiazide (PRINZIDE,ZESTORETIC) 10-12.5 MG tablet TAKE 1 TABLET BY MOUTH DAILY 90 tablet 2  .  metoprolol succinate (TOPROL-XL) 100 MG 24 hr tablet TAKE 1 TABLET(100 MG) BY MOUTH DAILY 90 tablet 3  . MULTAQ 400 MG tablet TAKE 1 TABLET BY MOUTH TWICE DAILY WITH MEAL 180 tablet 1  . Multiple Vitamin (MULTIVITAMIN WITH MINERALS) TABS tablet Take 1 tablet by mouth every other day.     . niacin (NIASPAN) 1000 MG CR tablet TAKE 1 TABLET(1000 MG) BY MOUTH AT BEDTIME 90 tablet 3  . NON FORMULARY CPAP at night    . rivaroxaban (XARELTO) 20 MG TABS tablet TAKE 1 TABLET BY MOUTH EVERY DAY WITH SUPPER 90 tablet 2  . traMADol (ULTRAM) 50 MG tablet TAKE 1 TABLET BY MOUTH EVERY 12 HOURS AS NEEDED 30 tablet 1  . traZODone (DESYREL) 50 MG tablet TAKE 1/2 TO 1 TABLET BY MOUTH AT BEDTIME AS NEEDED FOR SLEEP 30 tablet 3  . VENTOLIN HFA 108 (90 Base) MCG/ACT inhaler INHALE 2 PUFFS INTO THE LUNGS EVERY 6 HOURS AS NEEDED FOR WHEEZING OR SHORTNESS OF BREATH 18 g 1   No current facility-administered medications for this visit.    No Known Allergies

## 2019-02-24 ENCOUNTER — Other Ambulatory Visit: Payer: Self-pay | Admitting: Family Medicine

## 2019-03-15 DIAGNOSIS — M961 Postlaminectomy syndrome, not elsewhere classified: Secondary | ICD-10-CM | POA: Diagnosis not present

## 2019-03-15 DIAGNOSIS — M5136 Other intervertebral disc degeneration, lumbar region: Secondary | ICD-10-CM | POA: Diagnosis not present

## 2019-03-19 ENCOUNTER — Other Ambulatory Visit: Payer: Self-pay | Admitting: Cardiovascular Disease

## 2019-03-19 ENCOUNTER — Other Ambulatory Visit: Payer: Self-pay | Admitting: Internal Medicine

## 2019-03-19 DIAGNOSIS — I480 Paroxysmal atrial fibrillation: Secondary | ICD-10-CM

## 2019-03-21 NOTE — Telephone Encounter (Signed)
72yom Scr 1.2(01/25/19) 102.5kg ccr 32mlmin Lovw/caroll(01/28/19)

## 2019-04-13 ENCOUNTER — Encounter: Payer: Self-pay | Admitting: Family Medicine

## 2019-04-26 ENCOUNTER — Other Ambulatory Visit: Payer: Self-pay | Admitting: Family Medicine

## 2019-05-05 ENCOUNTER — Other Ambulatory Visit: Payer: Self-pay

## 2019-05-05 ENCOUNTER — Ambulatory Visit (INDEPENDENT_AMBULATORY_CARE_PROVIDER_SITE_OTHER): Payer: Medicare Other | Admitting: Family Medicine

## 2019-05-05 DIAGNOSIS — Z23 Encounter for immunization: Secondary | ICD-10-CM

## 2019-05-18 ENCOUNTER — Encounter: Payer: Self-pay | Admitting: Family Medicine

## 2019-05-18 DIAGNOSIS — N183 Chronic kidney disease, stage 3 unspecified: Secondary | ICD-10-CM

## 2019-05-18 DIAGNOSIS — E1159 Type 2 diabetes mellitus with other circulatory complications: Secondary | ICD-10-CM

## 2019-05-18 DIAGNOSIS — E1122 Type 2 diabetes mellitus with diabetic chronic kidney disease: Secondary | ICD-10-CM

## 2019-05-18 DIAGNOSIS — N1831 Chronic kidney disease, stage 3a: Secondary | ICD-10-CM

## 2019-05-18 DIAGNOSIS — I152 Hypertension secondary to endocrine disorders: Secondary | ICD-10-CM

## 2019-05-18 DIAGNOSIS — D509 Iron deficiency anemia, unspecified: Secondary | ICD-10-CM

## 2019-05-18 NOTE — Telephone Encounter (Signed)
A1C and CMP pended, any other labs?

## 2019-05-20 DIAGNOSIS — E1122 Type 2 diabetes mellitus with diabetic chronic kidney disease: Secondary | ICD-10-CM | POA: Diagnosis not present

## 2019-05-20 DIAGNOSIS — I10 Essential (primary) hypertension: Secondary | ICD-10-CM | POA: Diagnosis not present

## 2019-05-20 DIAGNOSIS — E1159 Type 2 diabetes mellitus with other circulatory complications: Secondary | ICD-10-CM | POA: Diagnosis not present

## 2019-05-20 DIAGNOSIS — N183 Chronic kidney disease, stage 3 (moderate): Secondary | ICD-10-CM | POA: Diagnosis not present

## 2019-05-20 LAB — CBC
HCT: 41.6 % (ref 38.5–50.0)
Hemoglobin: 13.1 g/dL — ABNORMAL LOW (ref 13.2–17.1)
MCH: 26.5 pg — ABNORMAL LOW (ref 27.0–33.0)
MCHC: 31.5 g/dL — ABNORMAL LOW (ref 32.0–36.0)
MCV: 84.2 fL (ref 80.0–100.0)
MPV: 10.5 fL (ref 7.5–12.5)
Platelets: 237 10*3/uL (ref 140–400)
RBC: 4.94 10*6/uL (ref 4.20–5.80)
RDW: 16.8 % — ABNORMAL HIGH (ref 11.0–15.0)
WBC: 7.6 10*3/uL (ref 3.8–10.8)

## 2019-05-26 ENCOUNTER — Other Ambulatory Visit: Payer: Self-pay

## 2019-05-26 DIAGNOSIS — J849 Interstitial pulmonary disease, unspecified: Secondary | ICD-10-CM

## 2019-05-26 MED ORDER — BUDESONIDE-FORMOTEROL FUMARATE 160-4.5 MCG/ACT IN AERO: 2.0000 | INHALATION_SPRAY | Freq: Two times a day (BID) | RESPIRATORY_TRACT | 0 refills | Status: DC

## 2019-06-01 ENCOUNTER — Telehealth: Payer: Self-pay | Admitting: Family Medicine

## 2019-06-01 DIAGNOSIS — N183 Chronic kidney disease, stage 3 unspecified: Secondary | ICD-10-CM

## 2019-06-01 DIAGNOSIS — D649 Anemia, unspecified: Secondary | ICD-10-CM

## 2019-06-01 DIAGNOSIS — E1122 Type 2 diabetes mellitus with diabetic chronic kidney disease: Secondary | ICD-10-CM

## 2019-06-01 DIAGNOSIS — N1831 Chronic kidney disease, stage 3a: Secondary | ICD-10-CM

## 2019-06-01 NOTE — Telephone Encounter (Signed)
Pt reviewed recommendations on mychart -EH/RMA

## 2019-06-01 NOTE — Telephone Encounter (Signed)
-----   Message from Gregor Hams, MD sent at 02/23/2019 10:28 AM EDT ----- Order DM and other labs for 3-4 month follow up and for high dose flu vaccine.

## 2019-06-01 NOTE — Telephone Encounter (Signed)
For whatever reason the labs that I ordered to be done about now to recheck diabetes and kidney function were only partially done.  Only the CBC was run but the hemoglobin W0J and metabolic panel was not completed.  I will reorder these labs and I would appreciate if [name] can get them done in the near future.  Additionally I will order another lab to follow-up the slightly low hemoglobin was present at last time we checked hemoglobin.   This does not need to be fasting.

## 2019-06-07 DIAGNOSIS — E1122 Type 2 diabetes mellitus with diabetic chronic kidney disease: Secondary | ICD-10-CM | POA: Diagnosis not present

## 2019-06-07 DIAGNOSIS — D649 Anemia, unspecified: Secondary | ICD-10-CM | POA: Diagnosis not present

## 2019-06-07 DIAGNOSIS — N183 Chronic kidney disease, stage 3 unspecified: Secondary | ICD-10-CM | POA: Diagnosis not present

## 2019-06-08 LAB — BASIC METABOLIC PANEL WITH GFR
BUN/Creatinine Ratio: 15 (calc) (ref 6–22)
BUN: 19 mg/dL (ref 7–25)
CO2: 30 mmol/L (ref 20–32)
Calcium: 9.6 mg/dL (ref 8.6–10.3)
Chloride: 101 mmol/L (ref 98–110)
Creat: 1.28 mg/dL — ABNORMAL HIGH (ref 0.70–1.18)
GFR, Est African American: 65 mL/min/{1.73_m2} (ref >=60)
GFR, Est Non African American: 56 mL/min/{1.73_m2} — ABNORMAL LOW (ref >=60)
Glucose, Bld: 150 mg/dL — ABNORMAL HIGH (ref 65–99)
Potassium: 3.7 mmol/L (ref 3.5–5.3)
Sodium: 139 mmol/L (ref 135–146)

## 2019-06-08 LAB — IRON,TIBC AND FERRITIN PANEL
%SAT: 10 % (calc) — ABNORMAL LOW (ref 20–48)
Ferritin: 10 ng/mL — ABNORMAL LOW (ref 24–380)
Iron: 42 ug/dL — ABNORMAL LOW (ref 50–180)
TIBC: 408 mcg/dL (calc) (ref 250–425)

## 2019-06-08 LAB — HEMOGLOBIN A1C
Hgb A1c MFr Bld: 7.2 % of total Hgb — ABNORMAL HIGH (ref <5.7)
Mean Plasma Glucose: 160 (calc)
eAG (mmol/L): 8.9 (calc)

## 2019-06-10 ENCOUNTER — Telehealth (INDEPENDENT_AMBULATORY_CARE_PROVIDER_SITE_OTHER): Payer: Medicare Other | Admitting: Family Medicine

## 2019-06-10 ENCOUNTER — Encounter: Payer: Self-pay | Admitting: Family Medicine

## 2019-06-10 VITALS — BP 137/77 | HR 68 | Wt 225.0 lb

## 2019-06-10 DIAGNOSIS — I110 Hypertensive heart disease with heart failure: Secondary | ICD-10-CM

## 2019-06-10 DIAGNOSIS — E1169 Type 2 diabetes mellitus with other specified complication: Secondary | ICD-10-CM

## 2019-06-10 DIAGNOSIS — I701 Atherosclerosis of renal artery: Secondary | ICD-10-CM

## 2019-06-10 DIAGNOSIS — D509 Iron deficiency anemia, unspecified: Secondary | ICD-10-CM | POA: Diagnosis not present

## 2019-06-10 DIAGNOSIS — N1831 Chronic kidney disease, stage 3a: Secondary | ICD-10-CM

## 2019-06-10 DIAGNOSIS — E1159 Type 2 diabetes mellitus with other circulatory complications: Secondary | ICD-10-CM

## 2019-06-10 DIAGNOSIS — E1122 Type 2 diabetes mellitus with diabetic chronic kidney disease: Secondary | ICD-10-CM

## 2019-06-10 DIAGNOSIS — E1121 Type 2 diabetes mellitus with diabetic nephropathy: Secondary | ICD-10-CM

## 2019-06-10 DIAGNOSIS — E785 Hyperlipidemia, unspecified: Secondary | ICD-10-CM

## 2019-06-10 DIAGNOSIS — I152 Hypertension secondary to endocrine disorders: Secondary | ICD-10-CM

## 2019-06-10 DIAGNOSIS — I272 Pulmonary hypertension, unspecified: Secondary | ICD-10-CM

## 2019-06-10 DIAGNOSIS — I10 Essential (primary) hypertension: Secondary | ICD-10-CM

## 2019-06-10 DIAGNOSIS — I480 Paroxysmal atrial fibrillation: Secondary | ICD-10-CM

## 2019-06-10 DIAGNOSIS — I739 Peripheral vascular disease, unspecified: Secondary | ICD-10-CM

## 2019-06-10 NOTE — Progress Notes (Signed)
Virtual Visit  I connected with      [name]  by a telemedicine application and verified that I am speaking with the correct person using two identifiers.   I discussed the limitations of evaluation and management by telemedicine and the availability of in person appointments. The patient expressed understanding and agreed to proceed.  History of Present Illness: [name] is a 73 y.o. male who would like to discuss anemia, diabetes, CKD.  Patient has a history of CKD likely attributable to diabetes and heart disease and renal artery stenosis.  Labs were checked recently and his creatinine has been largely stable but slightly worsened pushing his GFR less than 60 into CKD 3.  He feels fine with no change.  Diabetes: History of diabetes managed with Jardiance and diet.  Previously A1c less than 7.  A1c checked recently at 7.2.  He notes he has been less adherent to low carbohydrate diet recently and has restarted a low-carb diet.  Anemia: Patient has a long history of iron deficiency anemia.  Labs were checked recently and significant for iron deficiency.  He has not been taking iron supplementation and restarted Slow Fe 2 days ago.  He tolerates it well.  He notes his last colonoscopy was 9 years ago with Dr. Collene Mares.  He plans to repeat colonoscopy soon.  He denies any blood in the stool.  He get exercise regularly and feels pretty well overall.     Observations/Objective: BP 137/77   Pulse 68   Wt 225 lb (102.1 kg)   BMI 33.23 kg/m  Wt Readings from Last 5 Encounters:  06/10/19 225 lb (102.1 kg)  02/23/19 226 lb (102.5 kg)  02/04/19 226 lb (102.5 kg)  10/27/18 225 lb 3.2 oz (102.2 kg)  09/29/18 227 lb (103 kg)   Exam: Normal Speech.  No shortness of breath or tachypnea.  Able to complete sentences.  Lab and Radiology Results Results for orders placed or performed in visit on 06/01/19 (from the past 72 hour(s))  Hemoglobin A1c     Status: Abnormal    Collection Time: 06/07/19 11:18 AM  Result Value Ref Range   Hgb A1c MFr Bld 7.2 (H) <5.7 % of total Hgb    Comment: For someone without known diabetes, a hemoglobin A1c value of 6.5% or greater indicates that they may have  diabetes and this should be confirmed with a follow-up  test. . For someone with known diabetes, a value <7% indicates  that their diabetes is well controlled and a value  greater than or equal to 7% indicates suboptimal  control. A1c targets should be individualized based on  duration of diabetes, age, comorbid conditions, and  other considerations. . Currently, no consensus exists regarding use of hemoglobin A1c for diagnosis of diabetes for children. .    Mean Plasma Glucose 160 (calc)   eAG (mmol/L) 8.9 (calc)  BASIC METABOLIC PANEL WITH GFR     Status: Abnormal   Collection Time: 06/07/19 11:18 AM  Result Value Ref Range   Glucose, Bld 150 (H) 65 - 99 mg/dL    Comment: .            Fasting reference interval . For someone without known diabetes, a glucose value >125 mg/dL indicates that they may have diabetes and this should be confirmed with a follow-up test. .    BUN 19 7 - 25 mg/dL   Creat 1.28 (H) 0.70 - 1.18 mg/dL  Comment: For patients >38 years of age, the reference limit for Creatinine is approximately 13% higher for people identified as African-American. .    GFR, Est Non African American 56 (L) > OR = 60 mL/min/1.70m2   GFR, Est African American 65 > OR = 60 mL/min/1.94m2   BUN/Creatinine Ratio 15 6 - 22 (calc)   Sodium 139 135 - 146 mmol/L   Potassium 3.7 3.5 - 5.3 mmol/L   Chloride 101 98 - 110 mmol/L   CO2 30 20 - 32 mmol/L   Calcium 9.6 8.6 - 10.3 mg/dL  Iron, TIBC and Ferritin Panel     Status: Abnormal   Collection Time: 06/07/19 11:18 AM  Result Value Ref Range   Iron 42 (L) 50 - 180 mcg/dL   TIBC 408 250 - 425 mcg/dL (calc)   %SAT 10 (L) 20 - 48 % (calc)   Ferritin 10 (L) 24 - 380 ng/mL   No results  found.   Assessment and Plan: 73 y.o. male with  Diabetes: A1c slightly increased.  Plan to start low-carb diet and increase exercise.  Will repeat labs in about 3 months and follow-up with new PCP in 4 to 6 months likely Dr. Zigmund Daniel.  Iron deficiency anemia: Likely slow GI source blood loss.  Recommend recheck back with gastroenterology likely will need repeat colonoscopy.  Restart oral iron supplementation and recheck iron stores and CBC and erythropoietin in 3 months.  CKD 3: Likely due to constellation of factors including hypertension diabetes heart disease and renal artery stenosis.  Careful monitoring and blood pressure and diabetes control.  Recheck labs in about 3 months.  Reassess with Dr. Luetta Nutting or other PCP 4 to 6 months.  PDMP not reviewed this encounter. Orders Placed This Encounter  Procedures  . CBC  . COMPLETE METABOLIC PANEL WITH GFR  . Hemoglobin A1c  . Iron, TIBC and Ferritin Panel  . Erythropoietin   No orders of the defined types were placed in this encounter.   Follow Up Instructions:    I discussed the assessment and treatment plan with the patient. The patient was provided an opportunity to ask questions and all were answered. The patient agreed with the plan and demonstrated an understanding of the instructions.   The patient was advised to call back or seek an in-person evaluation if the symptoms worsen or if the condition fails to improve as anticipated.  Time: 25 minutes of intraservice time, with >39 minutes of total time during today's visit.      Historical information moved to improve visibility of documentation.  Past Medical History:  Diagnosis Date  . Atypical atrial flutter ([place])   . Coronary artery disease   . Diastolic dysfunction, left ventricle 05/2018   Dr. Rayann Heman- Cardiologist  . HLD (hyperlipidemia)   . HTN (hypertension)   . OSA (obstructive sleep apnea)    uses CPAP  . Peripheral artery disease ([place])     pseudoaneurysm post afib ablation at [facility] 2011, s/p bilateral iliac stents  . Persistent atrial fibrillation ([place])   . Pre-diabetes   . Renal artery stenosis ([facility])    right renal artery PTA and stenting  . S/P coronary artery bypass graft x 7 11/16/96   Past Surgical History:  Procedure Laterality Date  . 2-D echocardiogram  03/25/2010   Normal left ventricular function. Mild MR, TR, trivial AR  . ATRIAL FIBRILLATION ABLATION  03/27/2010, 12/31/2010   [facility], Dr. Nadeen Landau  . BACK SURGERY  2007  . cardiac  stress test  11/21/2009   Exercise capacity 5 METS. No significant ischemia demonstrated  . CARDIOVERSION N/A 03/15/2015   Procedure: CARDIOVERSION;  Surgeon: Lorretta Harp, MD;  Location: [facility] ENDOSCOPY;  Service: Cardiovascular;  Laterality: N/A;  . CARDIOVERSION N/A 07/10/2017   Procedure: CARDIOVERSION;  Surgeon: Pixie Casino, MD;  Location: [facility] ENDOSCOPY;  Service: Cardiovascular;  Laterality: N/A;  . CARDIOVERSION N/A 03/23/2018   Procedure: CARDIOVERSION;  Surgeon: Sanda Klein, MD;  Location: [facility] ENDOSCOPY;  Service: Cardiovascular;  Laterality: N/A;  . CORONARY ARTERY BYPASS GRAFT  1998  . ORCHIECTOMY  1981   for cancer  . TOOTH EXTRACTION  05/27/13   tooth extraction with bone graft   Social History   Tobacco Use  . Smoking status: Former Smoker    Packs/day: 1.50    Years: 30.00    Pack years: 45.00    Types: Cigarettes, Cigars    Quit date: 09/01/1994    Years since quitting: 24.7  . Smokeless tobacco: Never Used  . Tobacco comment: currently smokes a cigar occ-10/27/18  Substance Use Topics  . Alcohol use: Yes    Alcohol/week: 1.0 - 2.0 standard drinks    Types: 1 - 2 Glasses of wine per week    Comment: occasional   family history includes Cancer in his father and mother; Leukemia (age of onset: 70) in his daughter; Other in his brother and son.  Medications: Current Outpatient Medications  Medication Sig Dispense Refill  . acetaminophen (TYLENOL) 500 MG tablet  Take 1,000 mg every 6 (six) hours as needed by mouth (for pain.).    [name] atorvastatin (LIPITOR) 10 MG tablet TAKE 1 TABLET(10 MG) BY MOUTH DAILY 90 tablet 3  . budesonide-formoterol (SYMBICORT) 160-4.5 MCG/ACT inhaler Inhale 2 puffs into the lungs 2 (two) times daily. 1 Inhaler 0  . cetirizine (ZYRTEC) 10 MG tablet Take 10 mg by mouth daily.    . furosemide (LASIX) 40 MG tablet TAKE 1 TABLET BY MOUTH AS NEEDED 30 tablet 3  . gabapentin (NEURONTIN) 300 MG capsule TAKE 2 CAPSULES BY MOUTH EVERY NIGHT AT BEDTIME 180 capsule 0  . JARDIANCE 10 MG TABS tablet TAKE 1 TABLET BY MOUTH EVERY DAY 90 tablet 1  . lisinopril-hydrochlorothiazide (ZESTORETIC) 10-12.5 MG tablet TAKE 1 TABLET BY MOUTH DAILY 90 tablet 2  . metoprolol succinate (TOPROL-XL) 100 MG 24 hr tablet TAKE 1 TABLET(100 MG) BY MOUTH DAILY 90 tablet 3  . MULTAQ 400 MG tablet TAKE 1 TABLET BY MOUTH TWICE DAILY WITH MEAL 180 tablet 1  . Multiple Vitamin (MULTIVITAMIN WITH MINERALS) TABS tablet Take 1 tablet by mouth every other day.     . niacin (NIASPAN) 1000 MG CR tablet TAKE 1 TABLET(1000 MG) BY MOUTH AT BEDTIME 90 tablet 3  . NON FORMULARY CPAP at night    . traMADol (ULTRAM) 50 MG tablet TAKE 1 TABLET BY MOUTH EVERY 12 HOURS AS NEEDED 30 tablet 1  . traZODone (DESYREL) 50 MG tablet TAKE 1/2 TO 1 TABLET BY MOUTH AT BEDTIME AS NEEDED FOR SLEEP 90 tablet 3  . VENTOLIN HFA 108 (90 Base) MCG/ACT inhaler INHALE 2 PUFFS INTO THE LUNGS EVERY 6 HOURS AS NEEDED FOR WHEEZING OR SHORTNESS OF BREATH 18 g 1  . XARELTO 20 MG TABS tablet TAKE 1 TABLET BY MOUTH EVERY DAY WITH SUPPER 90 tablet 2   No current facility-administered medications for this visit.    No Known Allergies

## 2019-06-15 ENCOUNTER — Ambulatory Visit (INDEPENDENT_AMBULATORY_CARE_PROVIDER_SITE_OTHER): Payer: Medicare Other | Admitting: Pulmonary Disease

## 2019-06-15 ENCOUNTER — Encounter: Payer: Self-pay | Admitting: Pulmonary Disease

## 2019-06-15 DIAGNOSIS — J449 Chronic obstructive pulmonary disease, unspecified: Secondary | ICD-10-CM

## 2019-06-15 DIAGNOSIS — G4733 Obstructive sleep apnea (adult) (pediatric): Secondary | ICD-10-CM

## 2019-06-15 NOTE — Progress Notes (Signed)
Virtual Visit via Telephone Note  I connected with [name] on 06/15/19 at 10:00 AM EDT by telephone and verified that I am speaking with the correct person using two identifiers.   I discussed the limitations, risks, security and privacy concerns of performing an evaluation and management service by telephone and the availability of in person appointments. I also discussed with the patient that there may be a patient responsible charge related to this service. The patient expressed understanding and agreed to proceed.  History of Present Illness: 73 year old with history of atrial flutter, coronary artery disease, diastolic dysfunction, hypertension, OSA. Here for evaluation of dyspnea on exertion.  Has had issues for the past 10 years with no worsening of symptoms.  Has chronic cough with white mucus, chest congestion.  History noted for coronary artery disease, atrial flutter.  He was on amiodarone from 2009 -2011. This was stopped at [facility] after he had abnormal PFTs and elevated LFTs    Observations/Objective: He is continues to do well.  States that dyspnea is stable Playing golf without any issues. Feels that Symbicort helps with his breathing  Reviewed test results including PFTs, labs and CT scan  PFTs 11/16/2026 FVC 3.29 [94%], FEV1 2.33 [100%] F/F 71, TLC 85%, DLCO 76% Moderate obstruction with minimal diffusion defect  CT scan 11/18/2018-very minimal reticulation changes at the bases.  No evidence of traction bronchiectasis, honeycombing.  Aortic atherosclerosis, mild emphysematous changes, cirrhosis  Labs Alpha 1 antitrypsin 10/27/2018-166, PI MM,  IgE 10/27/2018- 26 CBC 10/27/2018- WBC 8.2, eos 6.5%, absolute eos count 533  Assessment and Plan: COPD Stable on Symbicort, continue albuterol as needed  Concern for ILD CT scan reviewed with no clear evidence of pulmonary fibrosis We will continue to monitor this with a CT scan and PFTs Continue Symbicort inhaler,  albuterol as needed  OSA Continue CPAP.  Managed by Dr. Claiborne Billings, cardiology  Health maintenance Up-to-date with flu and influenza vaccine  Follow Up Instructions: We will schedule you for high-resolution CT, spirometry diffusion capacity and 6-minute walk test and March 2021 Follow-up in clinic after these tests.  I discussed the assessment and treatment plan with the patient. The patient was provided an opportunity to ask questions and all were answered. The patient agreed with the plan and demonstrated an understanding of the instructions.   The patient was advised to call back or seek an in-person evaluation if the symptoms worsen or if the condition fails to improve as anticipated.  I provided 21 minutes of non-face-to-face time during this encounter.  [name] MD [facility] 06/15/2019, 10:06 AM

## 2019-06-15 NOTE — Addendum Note (Signed)
Addended by: Hildred Alamin I on: 06/15/2019 10:18 AM   Modules accepted: Orders

## 2019-06-15 NOTE — Patient Instructions (Signed)
We will schedule you for high-resolution CT, spirometry diffusion capacity and 6-minute walk test and March 2021 Follow-up in clinic after these tests.

## 2019-06-22 ENCOUNTER — Other Ambulatory Visit: Payer: Self-pay

## 2019-06-22 MED ORDER — METOPROLOL SUCCINATE ER 100 MG PO TB24: ORAL_TABLET | ORAL | 0 refills | Status: DC

## 2019-06-24 ENCOUNTER — Other Ambulatory Visit: Payer: Self-pay

## 2019-06-24 MED ORDER — NIACIN ER (ANTIHYPERLIPIDEMIC) 1000 MG PO TBCR: EXTENDED_RELEASE_TABLET | ORAL | 3 refills | Status: DC

## 2019-07-08 ENCOUNTER — Other Ambulatory Visit: Payer: Self-pay | Admitting: *Deleted

## 2019-07-08 DIAGNOSIS — J849 Interstitial pulmonary disease, unspecified: Secondary | ICD-10-CM

## 2019-07-08 MED ORDER — BUDESONIDE-FORMOTEROL FUMARATE 160-4.5 MCG/ACT IN AERO: 2.0000 | INHALATION_SPRAY | Freq: Two times a day (BID) | RESPIRATORY_TRACT | 5 refills | Status: DC

## 2019-07-12 ENCOUNTER — Other Ambulatory Visit: Payer: Self-pay

## 2019-07-12 ENCOUNTER — Other Ambulatory Visit: Payer: Self-pay | Admitting: Family Medicine

## 2019-07-12 DIAGNOSIS — I480 Paroxysmal atrial fibrillation: Secondary | ICD-10-CM

## 2019-07-12 MED ORDER — ATORVASTATIN CALCIUM 10 MG PO TABS: ORAL_TABLET | ORAL | 2 refills | Status: DC

## 2019-07-12 MED ORDER — MULTAQ 400 MG PO TABS: ORAL_TABLET | ORAL | 1 refills | Status: DC

## 2019-07-13 ENCOUNTER — Ambulatory Visit: Payer: Medicare Other | Admitting: Cardiovascular Disease

## 2019-07-13 ENCOUNTER — Encounter: Payer: Self-pay | Admitting: Cardiovascular Disease

## 2019-07-13 ENCOUNTER — Other Ambulatory Visit: Payer: Self-pay

## 2019-07-13 VITALS — BP 132/71 | HR 64 | Ht 72.0 in | Wt 228.0 lb

## 2019-07-13 DIAGNOSIS — I480 Paroxysmal atrial fibrillation: Secondary | ICD-10-CM | POA: Diagnosis not present

## 2019-07-13 DIAGNOSIS — G4733 Obstructive sleep apnea (adult) (pediatric): Secondary | ICD-10-CM

## 2019-07-13 DIAGNOSIS — I10 Essential (primary) hypertension: Secondary | ICD-10-CM

## 2019-07-13 DIAGNOSIS — J449 Chronic obstructive pulmonary disease, unspecified: Secondary | ICD-10-CM

## 2019-07-13 DIAGNOSIS — I152 Hypertension secondary to endocrine disorders: Secondary | ICD-10-CM

## 2019-07-13 DIAGNOSIS — E1159 Type 2 diabetes mellitus with other circulatory complications: Secondary | ICD-10-CM

## 2019-07-13 DIAGNOSIS — I255 Ischemic cardiomyopathy: Secondary | ICD-10-CM | POA: Insufficient documentation

## 2019-07-13 DIAGNOSIS — I4510 Unspecified right bundle-branch block: Secondary | ICD-10-CM

## 2019-07-13 DIAGNOSIS — I701 Atherosclerosis of renal artery: Secondary | ICD-10-CM

## 2019-07-13 DIAGNOSIS — E1169 Type 2 diabetes mellitus with other specified complication: Secondary | ICD-10-CM | POA: Diagnosis not present

## 2019-07-13 DIAGNOSIS — I739 Peripheral vascular disease, unspecified: Secondary | ICD-10-CM | POA: Diagnosis not present

## 2019-07-13 DIAGNOSIS — I519 Heart disease, unspecified: Secondary | ICD-10-CM | POA: Diagnosis not present

## 2019-07-13 DIAGNOSIS — E785 Hyperlipidemia, unspecified: Secondary | ICD-10-CM

## 2019-07-13 NOTE — Assessment & Plan Note (Signed)
History of peripheral arterial disease status post remote bilateral iliac stenting in 2007 with right renal artery stenting as well by myself.  He does complain of some left thigh claudication and had lower extremity arterial Doppler studies performed 07/02/2018 suggesting moderate disease in his left iliac artery.  He has Doppler scheduled in the next 10 days.  While this is symptomatic it is not extremely lifestyle limiting and the patient wishes to defer intervention for the time being.

## 2019-07-13 NOTE — Progress Notes (Signed)
07/13/2019 [name]   1946-03-16  [id number]  Primary Physician Gregor Hams, MD Primary Cardiologist: Lorretta Harp MD FACP, [place], [place], [place]  HPI:  [name] is a 73 y.o.  moderately overweight, divorced, Caucasian male father of 2, grandfather of 1 grandchild who I saw  07/20/2018.He has a history of CAD status post coronary artery bypass grafting x7 November 16, 1996. His other problems include PVOD status post bilateral iliac artery PTA and stenting by myself remotely with restenting in 2007. He has had right renal artery PTA and stenting as well. His other problems include hypertension, hyperlipidemia, non-insulin-requiring diabetes. He does have paroxysmal atrial fibrillation and has undergone multiple DC cardioversions as well as atrial fibrillation ablations by Dr. Samara Deist at [facility], most recently in May of last year, though he is now back in atrial fibrillation/flutter on a daily basis, which he is aware of but not symptomatic from. He is active and works out on a treadmill every day. He denies chest pain or shortness of breath. His last stress test performed one year ago which revealed an inferolateral scar without ischemia.  Since I saw him at 9 months ago has remained clinically stable. He denies chest pain, shortness of breath or claudication. He works out on a treadmill or plays golf daily. He is a Myoview stress test performed in October 2014 that showed inferolateral scar without ischemia unchanged from prior studies. Recent arterial Doppler studies of his lower extremities revealed ABIs in the 0.9 range bilaterally with patent iliac stents.he's noticed tachycardia over the last several months which on EKG yesterday is suggested A. Fib with RVR. He has been on Coumadin anticoagulation and has been therapeutic. I performed outpatient DC cardioversion on him 03/15/15 successfully back to sinus rhythm. He saw Dr. Rayann Heman subsequent to that and was placed on Multaq  antiarrhythmic therapy. His Coumadin was switched to Xarelto .   Since I saw him a year ago he continues to do well.  He has maintained sinus rhythm after his last cardioversion on Multaq and Xarelto.  He does complain of some left thigh claudication with Dopplers performed a year ago that suggested progression of disease in his left iliac artery although he wishes to defer intervention at this point.  He also had a prolonged upper respiratory tract infection December 2019 resulting in subsequent dyspnea on exertion principally on walking long distances and up an incline.  He has seen Dr. Vaughan Browner, [facility] pulmonology, who performed CT scanning and pulmonary function tests.  He was placed on Symbicort with some improvement.  He principally notices the symptoms when doing excessive activity especially walking up an incline.  I am going get a 2D echo to further evaluate.  His last ejection fraction was 45 to 50% by echo performed 05/13/2018 with grade 2 diastolic dysfunction.  He is on Lasix, hydrochlorothiazide and Jardiance.   Current Meds  Medication Sig  . acetaminophen (TYLENOL) 500 MG tablet Take 1,000 mg every 6 (six) hours as needed by mouth (for pain.).  [name] atorvastatin (LIPITOR) 10 MG tablet TAKE 1 TABLET(10 MG) BY MOUTH DAILY  . budesonide-formoterol (SYMBICORT) 160-4.5 MCG/ACT inhaler Inhale 2 puffs into the lungs 2 (two) times daily.  . cetirizine (ZYRTEC) 10 MG tablet Take 10 mg by mouth daily.  [name] dronedarone (MULTAQ) 400 MG tablet TAKE 1 TABLET BY MOUTH TWICE DAILY WITH MEAL  . Ferrous Sulfate (SLOW FE) 142 (45 Fe) MG TBCR Take 1 tablet by mouth 2 (  two) times daily.  . furosemide (LASIX) 40 MG tablet TAKE 1 TABLET BY MOUTH AS NEEDED  . gabapentin (NEURONTIN) 300 MG capsule TAKE 2 CAPSULES BY MOUTH EVERY NIGHT AT BEDTIME  . JARDIANCE 10 MG TABS tablet TAKE 1 TABLET BY MOUTH EVERY DAY  . lisinopril-hydrochlorothiazide (ZESTORETIC) 10-12.5 MG tablet TAKE 1 TABLET BY MOUTH DAILY  . metoprolol  succinate (TOPROL-XL) 100 MG 24 hr tablet TAKE 1 TABLET(100 MG) BY MOUTH DAILY  . Multiple Vitamin (MULTIVITAMIN WITH MINERALS) TABS tablet Take 1 tablet by mouth every other day.   . niacin (NIASPAN) 1000 MG CR tablet TAKE 1 TABLET(1000 MG) BY MOUTH AT BEDTIME  . NON FORMULARY CPAP at night  . traMADol (ULTRAM) 50 MG tablet TAKE 1 TABLET BY MOUTH EVERY 12 HOURS AS NEEDED  . traZODone (DESYREL) 50 MG tablet TAKE 1/2 TO 1 TABLET BY MOUTH AT BEDTIME AS NEEDED FOR SLEEP  . VENTOLIN HFA 108 (90 Base) MCG/ACT inhaler INHALE 2 PUFFS INTO THE LUNGS EVERY 6 HOURS AS NEEDED FOR WHEEZING OR SHORTNESS OF BREATH  . XARELTO 20 MG TABS tablet TAKE 1 TABLET BY MOUTH EVERY DAY WITH SUPPER     No Known Allergies  Social History   Socioeconomic History  . Marital status: Divorced    Spouse name: Not on file  . Number of children: 2  . Years of education: 79  . Highest education level: Associate degree: academic program  Occupational History  . Occupation: retired    Comment: Community education officer of [employer]  Social Needs  . Financial resource strain: Not hard at all  . Food insecurity    Worry: Never true    Inability: Never true  . Transportation needs    Medical: No    Non-medical: No  Tobacco Use  . Smoking status: Former Smoker    Packs/day: 1.50    Years: 30.00    Pack years: 45.00    Types: Cigarettes, Cigars    Quit date: 09/01/1994    Years since quitting: 24.8  . Smokeless tobacco: Never Used  . Tobacco comment: currently smokes a cigar occ-10/27/18  Substance and Sexual Activity  . Alcohol use: Yes    Alcohol/week: 1.0 - 2.0 standard drinks    Types: 1 - 2 Glasses of wine per week    Comment: occasional  . Drug use: No  . Sexual activity: Yes  Lifestyle  . Physical activity    Days per week: 2 days    Minutes per session: 10 min  . Stress: Not at all  Relationships  . Social connections    Talks on phone: More than three times a week    Gets together: Once a week    Attends  religious service: Never    Active member of club or organization: No    Attends meetings of clubs or organizations: Never    Relationship status: Divorced  . Intimate partner violence    Fear of current or ex partner: No    Emotionally abused: No    Physically abused: No    Forced sexual activity: No  Other Topics Concern  . Not on file  Social History Narrative   Pt lives in [place] alone.  Divorced. 2 cups of coffee in the morning.   Retired from [employer])     Review of Systems: General: negative for chills, fever, night sweats or weight changes.  Cardiovascular: negative for chest pain, dyspnea on exertion, edema, orthopnea, palpitations, paroxysmal nocturnal dyspnea or  shortness of breath Dermatological: negative for rash Respiratory: negative for cough or wheezing Urologic: negative for hematuria Abdominal: negative for nausea, vomiting, diarrhea, bright red blood per rectum, melena, or hematemesis Neurologic: negative for visual changes, syncope, or dizziness All other systems reviewed and are otherwise negative except as noted above.    Blood pressure 132/71, pulse 64, height 6' (1.829 m), weight 228 lb (103.4 kg), SpO2 94 %.  General appearance: alert and no distress Neck: no adenopathy, no carotid bruit, no JVD, supple, symmetrical, trachea midline and thyroid not enlarged, symmetric, no tenderness/mass/nodules Lungs: clear to auscultation bilaterally Heart: regular rate and rhythm, S1, S2 normal, no murmur, click, rub or gallop Extremities: extremities normal, atraumatic, no cyanosis or edema Pulses: 2+ and symmetric Skin: Skin color, texture, turgor normal. No rashes or lesions Neurologic: Alert and oriented X 3, normal strength and tone. Normal symmetric reflexes. Normal coordination and gait  EKG sinus rhythm 64 with right bundle branch block.  I personally reviewed this EKG.  ASSESSMENT AND PLAN:   Paroxysmal atrial fibrillation  History of paroxysmal atrial fib currently maintaining sinus rhythm on Multaq and Xarelto.  He has had multiple DC cardioversions in the past and had A. fib ablation by Dr. Liana Gerold [facility] remotely as well.  He had cardioversions by Dr. Debara Pickett and Dr. Sallyanne Kuster last year and has been maintaining sinus rhythm since.  Status post coronary artery bypass grafting x 7, 11/16/1996 History of CAD status post CABG x7 November 16, 1996.  He said no cardiac issues since that time with his most recent Myoview stress test performed October 2014 revealing inferolateral scar without ischemia.  He denies chest pain but has had some dyspnea on exertion of the last year.  Peripheral artery disease History of peripheral arterial disease status post remote bilateral iliac stenting in 2007 with right renal artery stenting as well by myself.  He does complain of some left thigh claudication and had lower extremity arterial Doppler studies performed 07/02/2018 suggesting moderate disease in his left iliac artery.  He has Doppler scheduled in the next 10 days.  While this is symptomatic it is not extremely lifestyle limiting and the patient wishes to defer intervention for the time being.  Hyperlipidemia associated with type 2 diabetes mellitus ([place]) History of hyperlipidemia on statin therapy with recent lipid profile performed 01/25/2019 revealing a LDL of 48.  Hypertension associated with diabetes ([place]) History of essential hypertension with blood pressure measured today at 132/71.  He is on lisinopril, hydrochlorothiazide and metoprolol.  Bundle branch block, right Chronic  OSA and COPD overlap syndrome (HCC) History of obstructive sleep apnea on CPAP followed by Dr. Claiborne Billings  Left renal artery stenosis [facility]) History of right renal artery stenting remotely without recent renal Doppler studies.  His blood pressures been under good control.  Ischemic cardiomyopathy [name]'s most recent 2D echo  05/13/2018 revealed an EF of 45 to 50% with mild LVH and apical anterior and septal hypokinesia with grade 2 diastolic dysfunction.  He apparently had an upper respiratory tract infection last December which lasted several weeks and he has been somewhat dyspneic since that time with slow improvement.  He did see Dr. Vaughan Browner, pulmonology, who performed pulmonary function testing and chest CT and placed him on inhaled bronchodilators which helped somewhat (Symbicort).  He is also on diuretics including Lasix, hydrochlorothiazide and Jardiance.  He is dry on exam.  I am going to get a 2D echo to further evaluate.  Lorretta Harp MD FACP,FACC,FAHA, [facility] 07/13/2019 9:22 AM

## 2019-07-13 NOTE — Assessment & Plan Note (Signed)
History of paroxysmal atrial fib currently maintaining sinus rhythm on Multaq and Xarelto.  He has had multiple DC cardioversions in the past and had A. fib ablation by Dr. Liana Gerold [facility] remotely as well.  He had cardioversions by Dr. Debara Pickett and Dr. Sallyanne Kuster last year and has been maintaining sinus rhythm since.

## 2019-07-13 NOTE — Assessment & Plan Note (Signed)
History of hyperlipidemia on statin therapy with recent lipid profile performed 01/25/2019 revealing a LDL of 48.

## 2019-07-13 NOTE — Assessment & Plan Note (Signed)
History of right renal artery stenting remotely without recent renal Doppler studies.  His blood pressures been under good control.

## 2019-07-13 NOTE — Patient Instructions (Signed)
Medication Instructions:  Your physician recommends that you continue on your current medications as directed. Please refer to the Current Medication list given to you today.  If you need a refill on your cardiac medications before your next appointment, please call your pharmacy.   Lab work: No  Testing/Procedures: Your physician has requested that you have an echocardiogram. Echocardiography is a painless test that uses sound waves to create images of your heart. It provides your doctor with information about the size and shape of your heart and how well your heart's chambers and valves are working. This procedure takes approximately one hour. There are no restrictions for this procedure. [place] has requested that you have a lower extremity arterial exercise duplex in ONE YEAR. During this test, exercise and ultrasound are used to evaluate arterial blood flow in the legs. Allow one hour for this exam. There are no restrictions or special instructions.  Follow-Up: At [facility], you and your health needs are our priority.  As part of our continuing mission to provide you with exceptional heart care, we have created designated Provider Care Teams.  These Care Teams include your primary Cardiologist (physician) and Advanced Practice Providers (APPs -  Physician Assistants and Nurse Practitioners) who all work together to provide you with the care you need, when you need it. You may see Dr. Gwenlyn Found or one of the following Advanced Practice Providers on your designated Care Team:    Kerin Ransom, PA-C  Ferrer Comunidad, [place]  Coletta Memos, [place]    Your physician wants you to follow-up in: [place]. You will receive a reminder letter in the mail two months in advance. If you don't receive a letter, please call our office to schedule the follow-up appointment.

## 2019-07-13 NOTE — Assessment & Plan Note (Signed)
History of CAD status post CABG x7 November 16, 1996.  He said no cardiac issues since that time with his most recent Myoview stress test performed October 2014 revealing inferolateral scar without ischemia.  He denies chest pain but has had some dyspnea on exertion of the last year.

## 2019-07-13 NOTE — Assessment & Plan Note (Signed)
[name] most recent 2D echo 05/13/2018 revealed an EF of 45 to 50% with mild LVH and apical anterior and septal hypokinesia with grade 2 diastolic dysfunction.  He apparently had an upper respiratory tract infection last December which lasted several weeks and he has been somewhat dyspneic since that time with slow improvement.  He did see Dr. Vaughan Browner, pulmonology, who performed pulmonary function testing and chest CT and placed him on inhaled bronchodilators which helped somewhat (Symbicort).  He is also on diuretics including Lasix, hydrochlorothiazide and Jardiance.  He is dry on exam.  I am going to get a 2D echo to further evaluate.

## 2019-07-13 NOTE — Assessment & Plan Note (Signed)
Chronic. 

## 2019-07-13 NOTE — Assessment & Plan Note (Signed)
History of obstructive sleep apnea on CPAP followed by Dr. Claiborne Billings

## 2019-07-13 NOTE — Assessment & Plan Note (Signed)
History of essential hypertension with blood pressure measured today at 132/71.  He is on lisinopril, hydrochlorothiazide and metoprolol.

## 2019-07-19 ENCOUNTER — Other Ambulatory Visit: Payer: Self-pay | Admitting: Cardiovascular Disease

## 2019-07-19 ENCOUNTER — Ambulatory Visit (HOSPITAL_COMMUNITY)
Admission: RE | Admit: 2019-07-19 | Discharge: 2019-07-19 | Disposition: A | Discharge status: Home / Self Care | Payer: Medicare Other | Source: Ambulatory Visit | Attending: Cardiovascular Disease | Admitting: Cardiovascular Disease

## 2019-07-19 ENCOUNTER — Other Ambulatory Visit: Payer: Self-pay

## 2019-07-19 ENCOUNTER — Ambulatory Visit (HOSPITAL_BASED_OUTPATIENT_CLINIC_OR_DEPARTMENT_OTHER)
Admission: RE | Admit: 2019-07-19 | Discharge: 2019-07-19 | Disposition: A | Discharge status: Home / Self Care | Payer: Medicare Other | Source: Ambulatory Visit | Attending: Cardiovascular Disease | Admitting: Cardiovascular Disease

## 2019-07-19 DIAGNOSIS — I739 Peripheral vascular disease, unspecified: Secondary | ICD-10-CM

## 2019-07-19 DIAGNOSIS — I771 Stricture of artery: Secondary | ICD-10-CM

## 2019-07-20 ENCOUNTER — Other Ambulatory Visit: Payer: Self-pay | Admitting: *Deleted

## 2019-07-20 DIAGNOSIS — I739 Peripheral vascular disease, unspecified: Secondary | ICD-10-CM

## 2019-07-27 ENCOUNTER — Other Ambulatory Visit: Payer: Self-pay

## 2019-07-27 ENCOUNTER — Other Ambulatory Visit: Payer: Self-pay | Admitting: Family Medicine

## 2019-07-27 ENCOUNTER — Ambulatory Visit (HOSPITAL_COMMUNITY): Payer: Medicare Other | Attending: Cardiovascular Disease

## 2019-07-27 DIAGNOSIS — I739 Peripheral vascular disease, unspecified: Secondary | ICD-10-CM | POA: Diagnosis not present

## 2019-07-27 DIAGNOSIS — I519 Heart disease, unspecified: Secondary | ICD-10-CM | POA: Insufficient documentation

## 2019-07-27 MED ORDER — PERFLUTREN LIPID MICROSPHERE
1.0000 mL | INTRAVENOUS | Status: AC | PRN
  Administered 2019-07-27: 2 mL via INTRAVENOUS

## 2019-08-02 DIAGNOSIS — G4733 Obstructive sleep apnea (adult) (pediatric): Secondary | ICD-10-CM | POA: Diagnosis not present

## 2019-08-03 ENCOUNTER — Ambulatory Visit (HOSPITAL_COMMUNITY): Payer: Medicare Other | Admitting: Nurse Practitioner

## 2019-08-03 ENCOUNTER — Ambulatory Visit (HOSPITAL_COMMUNITY): Payer: Medicare Other | Admitting: Physician Assistant

## 2019-08-25 ENCOUNTER — Encounter: Payer: Self-pay | Admitting: Family Medicine

## 2019-09-12 ENCOUNTER — Other Ambulatory Visit: Payer: Self-pay | Admitting: Neurology

## 2019-09-12 MED ORDER — JARDIANCE 10 MG PO TABS: 10.0000 mg | ORAL_TABLET | Freq: Every day | ORAL | 0 refills | Status: DC

## 2019-09-14 DIAGNOSIS — E1121 Type 2 diabetes mellitus with diabetic nephropathy: Secondary | ICD-10-CM | POA: Diagnosis not present

## 2019-09-14 DIAGNOSIS — D509 Iron deficiency anemia, unspecified: Secondary | ICD-10-CM | POA: Diagnosis not present

## 2019-09-14 DIAGNOSIS — E1169 Type 2 diabetes mellitus with other specified complication: Secondary | ICD-10-CM | POA: Diagnosis not present

## 2019-09-14 DIAGNOSIS — I480 Paroxysmal atrial fibrillation: Secondary | ICD-10-CM | POA: Diagnosis not present

## 2019-09-16 ENCOUNTER — Encounter: Payer: Self-pay | Admitting: Family Medicine

## 2019-09-16 LAB — ERYTHROPOIETIN: Erythropoietin: 19.5 m[IU]/mL — ABNORMAL HIGH (ref 2.6–18.5)

## 2019-09-16 LAB — COMPLETE METABOLIC PANEL WITH GFR
AG Ratio: 1.6 (calc) (ref 1.0–2.5)
ALT: 12 U/L (ref 9–46)
AST: 19 U/L (ref 10–35)
Albumin: 4.2 g/dL (ref 3.6–5.1)
Alkaline phosphatase (APISO): 93 U/L (ref 35–144)
BUN/Creatinine Ratio: 13 (calc) (ref 6–22)
BUN: 18 mg/dL (ref 7–25)
CO2: 30 mmol/L (ref 20–32)
Calcium: 10.3 mg/dL (ref 8.6–10.3)
Chloride: 99 mmol/L (ref 98–110)
Creat: 1.41 mg/dL — ABNORMAL HIGH (ref 0.70–1.18)
GFR, Est African American: 58 mL/min/{1.73_m2} — ABNORMAL LOW (ref >=60)
GFR, Est Non African American: 50 mL/min/{1.73_m2} — ABNORMAL LOW (ref >=60)
Globulin: 2.7 g/dL (calc) (ref 1.9–3.7)
Glucose, Bld: 168 mg/dL — ABNORMAL HIGH (ref 65–99)
Potassium: 4.2 mmol/L (ref 3.5–5.3)
Sodium: 138 mmol/L (ref 135–146)
Total Bilirubin: 0.7 mg/dL (ref 0.2–1.2)
Total Protein: 6.9 g/dL (ref 6.1–8.1)

## 2019-09-16 LAB — CBC
HCT: 50.5 % — ABNORMAL HIGH (ref 38.5–50.0)
Hemoglobin: 16.8 g/dL (ref 13.2–17.1)
MCH: 31 pg (ref 27.0–33.0)
MCHC: 33.3 g/dL (ref 32.0–36.0)
MCV: 93.2 fL (ref 80.0–100.0)
MPV: 11.1 fL (ref 7.5–12.5)
Platelets: 226 10*3/uL (ref 140–400)
RBC: 5.42 10*6/uL (ref 4.20–5.80)
RDW: 16.4 % — ABNORMAL HIGH (ref 11.0–15.0)
WBC: 8.9 10*3/uL (ref 3.8–10.8)

## 2019-09-16 LAB — HEMOGLOBIN A1C
Hgb A1c MFr Bld: 6.7 % of total Hgb — ABNORMAL HIGH (ref <5.7)
Mean Plasma Glucose: 146 (calc)
eAG (mmol/L): 8.1 (calc)

## 2019-09-16 LAB — IRON,TIBC AND FERRITIN PANEL
%SAT: 19 % (calc) — ABNORMAL LOW (ref 20–48)
Ferritin: 28 ng/mL (ref 24–380)
Iron: 66 ug/dL (ref 50–180)
TIBC: 346 mcg/dL (calc) (ref 250–425)

## 2019-09-18 ENCOUNTER — Other Ambulatory Visit: Payer: Self-pay | Admitting: Cardiovascular Disease

## 2019-09-19 NOTE — Telephone Encounter (Signed)
Left patient a voicemail with information below. Let patient know to call us back to schedule an appointment. 

## 2019-09-20 NOTE — Telephone Encounter (Signed)
Rx(s) sent to pharmacy electronically.  

## 2019-09-21 ENCOUNTER — Ambulatory Visit: Payer: Medicare Other

## 2019-10-15 ENCOUNTER — Ambulatory Visit: Payer: Medicare Other | Attending: Internal Medicine

## 2019-10-15 DIAGNOSIS — Z23 Encounter for immunization: Secondary | ICD-10-CM

## 2019-10-15 NOTE — Progress Notes (Signed)
   Covid-19 Vaccination Clinic  Name:  [name]    MRN: [id number] DOB: 23-Sep-1945  10/15/2019  [name] was observed post Covid-19 immunization for 15 minutes without incidence. He was provided with Vaccine Information Sheet and instruction to access the V-Safe system.   [name] was instructed to call 911 with any severe reactions post vaccine: [name] Difficulty breathing  . Swelling of your face and throat  . A fast heartbeat  . A bad rash all over your body  . Dizziness and weakness    Immunizations Administered    Name Date Dose VIS Date Route   Pfizer COVID-19 Vaccine 10/15/2019 10:51 AM 0.3 mL 08/12/2019 Intramuscular   Manufacturer: [place]   Lot: [id number]   [place]: [id number]

## 2019-10-26 ENCOUNTER — Other Ambulatory Visit: Payer: Self-pay | Admitting: Neurology

## 2019-10-26 MED ORDER — GABAPENTIN 300 MG PO CAPS: 600.0000 mg | ORAL_CAPSULE | Freq: Every day | ORAL | 0 refills | Status: DC

## 2019-11-07 ENCOUNTER — Encounter: Payer: Self-pay | Admitting: Family Medicine

## 2019-11-07 ENCOUNTER — Ambulatory Visit: Payer: Medicare Other | Attending: Internal Medicine

## 2019-11-07 DIAGNOSIS — Z23 Encounter for immunization: Secondary | ICD-10-CM | POA: Insufficient documentation

## 2019-11-07 NOTE — Progress Notes (Signed)
   Covid-19 Vaccination Clinic  Name:  [name]    MRN: [id number] DOB: [birth year]  11/07/2019  [name] was observed post Covid-19 immunization for 15 minutes without incident. He was provided with Vaccine Information Sheet and instruction to access the V-Safe system.   [name] was instructed to call 911 with any severe reactions post vaccine: [name] Difficulty breathing  . Swelling of face and throat  . A fast heartbeat  . A bad rash all over body  . Dizziness and weakness   Immunizations Administered    Name Date Dose VIS Date Route   Pfizer COVID-19 Vaccine 11/07/2019 11:20 AM 0.3 mL 08/12/2019 Intramuscular   Manufacturer: [place]   Lot: [id number]   [place]: [id number]

## 2019-11-08 MED ORDER — JARDIANCE 10 MG PO TABS: 10.0000 mg | ORAL_TABLET | Freq: Every day | ORAL | 0 refills | Status: DC

## 2019-11-15 ENCOUNTER — Other Ambulatory Visit: Payer: Self-pay

## 2019-11-15 ENCOUNTER — Encounter: Payer: Self-pay | Admitting: Family Medicine

## 2019-11-15 ENCOUNTER — Ambulatory Visit (INDEPENDENT_AMBULATORY_CARE_PROVIDER_SITE_OTHER): Payer: Medicare Other | Admitting: Family Medicine

## 2019-11-15 DIAGNOSIS — I480 Paroxysmal atrial fibrillation: Secondary | ICD-10-CM

## 2019-11-15 DIAGNOSIS — G4733 Obstructive sleep apnea (adult) (pediatric): Secondary | ICD-10-CM | POA: Diagnosis not present

## 2019-11-15 DIAGNOSIS — I152 Hypertension secondary to endocrine disorders: Secondary | ICD-10-CM

## 2019-11-15 DIAGNOSIS — E1159 Type 2 diabetes mellitus with other circulatory complications: Secondary | ICD-10-CM | POA: Diagnosis not present

## 2019-11-15 DIAGNOSIS — I10 Essential (primary) hypertension: Secondary | ICD-10-CM

## 2019-11-15 DIAGNOSIS — I2510 Atherosclerotic heart disease of native coronary artery without angina pectoris: Secondary | ICD-10-CM

## 2019-11-15 DIAGNOSIS — J449 Chronic obstructive pulmonary disease, unspecified: Secondary | ICD-10-CM

## 2019-11-15 DIAGNOSIS — I2583 Coronary atherosclerosis due to lipid rich plaque: Secondary | ICD-10-CM

## 2019-11-15 DIAGNOSIS — E1122 Type 2 diabetes mellitus with diabetic chronic kidney disease: Secondary | ICD-10-CM

## 2019-11-15 DIAGNOSIS — N1831 Chronic kidney disease, stage 3a: Secondary | ICD-10-CM

## 2019-11-15 DIAGNOSIS — E1121 Type 2 diabetes mellitus with diabetic nephropathy: Secondary | ICD-10-CM

## 2019-11-15 MED ORDER — GABAPENTIN 300 MG PO CAPS: 600.0000 mg | ORAL_CAPSULE | Freq: Every day | ORAL | 2 refills | Status: DC

## 2019-11-15 MED ORDER — JARDIANCE 10 MG PO TABS: 10.0000 mg | ORAL_TABLET | Freq: Every day | ORAL | 2 refills | Status: DC

## 2019-11-15 NOTE — Patient Instructions (Signed)
Great to meet you today! Please continue current medications.  We'll plan to follow up in about 3 months and check labs again at that time.

## 2019-11-16 ENCOUNTER — Telehealth: Payer: Self-pay | Admitting: Pulmonary Disease

## 2019-11-16 ENCOUNTER — Ambulatory Visit (INDEPENDENT_AMBULATORY_CARE_PROVIDER_SITE_OTHER)
Admission: RE | Admit: 2019-11-16 | Discharge: 2019-11-16 | Disposition: A | Discharge status: Home / Self Care | Payer: Medicare Other | Source: Ambulatory Visit | Attending: Pulmonary Disease | Admitting: Pulmonary Disease

## 2019-11-16 ENCOUNTER — Encounter: Payer: Self-pay | Admitting: Family Medicine

## 2019-11-16 DIAGNOSIS — R911 Solitary pulmonary nodule: Secondary | ICD-10-CM

## 2019-11-16 DIAGNOSIS — J449 Chronic obstructive pulmonary disease, unspecified: Secondary | ICD-10-CM | POA: Diagnosis not present

## 2019-11-16 DIAGNOSIS — IMO0001 Reserved for inherently not codable concepts without codable children: Secondary | ICD-10-CM

## 2019-11-16 DIAGNOSIS — J432 Centrilobular emphysema: Secondary | ICD-10-CM | POA: Diagnosis not present

## 2019-11-16 NOTE — Assessment & Plan Note (Signed)
Most recent A1c of  Lab Results  Component Value Date   HGBA1C 6.7 (H) 09/14/2019   indicates diabetes is well controlled.  he  will continue jardiance, refills provided.  Counseled on healthy, low carb diet and recommend frequent activity to help with maintaining good control of blood sugars.

## 2019-11-16 NOTE — Telephone Encounter (Signed)
Called [facility] and spoke with Warba. She stated [facility] wanted to make sure we had received the CT report from earlier today. Advised her that we can see the results in Epic and that I will route the results to Dr. Vaughan Browner. She verbalized understanding.    Below are the impressions from today's exam:   IMPRESSION: 1. Right upper lobe nodule is worrisome for primary bronchogenic carcinoma. These results will be called to the ordering clinician or representative by the Radiologist Assistant, and communication documented in the PACS or [employer]. 2. No evidence of interstitial lung disease.  Cirrhosis. 3. Cholelithiasis. 4. Left adrenal adenoma. 5.  Aortic atherosclerosis (ICD10-I70.0). 6.  Emphysema (ICD10-J43.9).  They are concerned about the 1st impression.   Dr. Vaughan Browner, please advise. Thanks!

## 2019-11-16 NOTE — Assessment & Plan Note (Signed)
RRR on exam today. Denies symptoms at this time.  He'll continue to follow with cardiology.  He is adequately anticoagulated with xarelto.

## 2019-11-16 NOTE — Assessment & Plan Note (Signed)
Stable, denies anginal symptoms at this time.

## 2019-11-16 NOTE — Progress Notes (Signed)
[name] - 74 y.o. male MRN [id number]  Date of birth: 03-17-1946  Subjective No chief complaint on file.   HPI [name] is a 74 y.o. male with history of PAF, CAD s/p CABG, COPD, HTN, HLD and T2DM here today for follow up visit.  He report that he is doing well and has no new concerns today.  He continues to see cardiology and pulmonology.    -T2DM:  Current treatment with jardiance.  Good control of blood sugars with current medication.  He does try to watch his diet as well. He has neuropathy, some improvement with gabapentin.   -HTN/CAD/A.fib:  Current medicaitons include toprol-xl, lisinopril/hctz, furosemide, and dronedarone.  Well controlled with current medications.  He is anticoagulated with xarelto, denies bleeding.  He has not had chest pain, shortness of breath, palpitations, dizziness  -COPD:  Former smoker.  Current management with symbicort daily and albuterol as needed.  Well controlled at this time.    ROS:  A comprehensive ROS was completed and negative except as noted per HPI  No Known Allergies  Past Medical History:  Diagnosis Date  . Atypical atrial flutter ([place])   . Coronary artery disease   . Diastolic dysfunction, left ventricle 05/2018   Dr. Rayann Heman- Cardiologist  . HLD (hyperlipidemia)   . HTN (hypertension)   . OSA (obstructive sleep apnea)    uses CPAP  . Peripheral artery disease ([place])    pseudoaneurysm post afib ablation at [facility] 2011, s/p bilateral iliac stents  . Persistent atrial fibrillation ([place])   . Pre-diabetes   . Renal artery stenosis ([facility])    right renal artery PTA and stenting  . S/P coronary artery bypass graft x 7 11/16/96    Past Surgical History:  Procedure Laterality Date  . 2-D echocardiogram  03/25/2010   Normal left ventricular function. Mild MR, TR, trivial AR  . ATRIAL FIBRILLATION ABLATION  03/27/2010, 12/31/2010   [facility], Dr. Nadeen Landau  . BACK SURGERY  2007  . cardiac stress test  11/21/2009   Exercise capacity  5 METS. No significant ischemia demonstrated  . CARDIOVERSION N/A 03/15/2015   Procedure: CARDIOVERSION;  Surgeon: Lorretta Harp, MD;  Location[facility] END[facility]COPY;  Service: Cardiovascular;  Laterality: N/A;  . CARDIOVERSION N/A 07/10/2017   Procedure: CARDIOVERSION;  Surgeon: Pixie Casino, MD;  Location: [facility] ENDOSCOPY;  Service: Cardiovascular;  Laterality: N/A;  . CARDIOVERSION N/A 03/23/2018   Procedure: CARDIOVERSION;  Surgeon: Sanda Klein, MD;  Location: [facility] ENDOSCOPY;  Service: Cardiovascular;  Laterality: N/A;  . CORONARY ARTERY BYPASS GRAFT  1998  . ORCHIECTOMY  1981   for cancer  . TOOTH EXTRACTION  05/27/13   tooth extraction with bone graft    Social History   Socioeconomic History  . Marital status: Divorced    Spouse name: Not on file  . Number of children: 2  . Years of education: 75  . Highest education level: Associate degree: academic program  Occupational History  . 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 Smoking status: Former Smoker    Packs/day: 1.50    Years: 30.00    Pack years: 45.00    Types: Cigarettes, Cigars    Quit date: 09/01/1994    Years since quitting: 25.2  . Smokeless tobacco: Never Used  . Tobacco comment: currently smokes a cigar occ-10/27/18  Substance and Sexual Activity  . Alcohol use: Yes    Alcohol/week: 1.0 - 2.0 standard drinks  Types: 1 - 2 Glasses of wine per week    Comment: occasional  . Drug use: No  . Sexual activity: Yes  Other Topics Concern  . Not on file  Social History Narrative   Pt lives in [place] alone.  Divorced. 2 cups of coffee in the morning.   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 Difficulty of Paying Living Expenses:   Food Insecurity:   . Worried About Charity fundraiser in the Last Year:   . Arboriculturist in the Last Year:   Transportation Needs:   . Film/video editor (Medical):   [name]  Lack of Transportation (Non-Medical):   Physical Activity:   . Days of Exercise per Week:   . Minutes of Exercise per Session:   Stress:   . Feeling of Stress :   Social Connections:   . Frequency of Communication with Friends and Family:   . Frequency of Social Gatherings with Friends and Family:   . Attends Religious Services:   . Active Member of Clubs or Organizations:   . Attends Archivist Meetings:   [name] Marital Status:     Family History  Problem Relation Age of Onset  . Cancer Mother   . Cancer Father   . Other Brother        NO MEDICAL PROBLEMS  . Other Son        NO MEDICAL PROBLEMS  . Leukemia Daughter 6       Recover and has no other problems    Health Maintenance  Topic Date Due  . OPHTHALMOLOGY EXAM  09/07/2018  . FOOT EXAM  06/10/2019  . HEMOGLOBIN A1C  03/13/2020  . COLONOSCOPY  06/27/2020  . TETANUS/TDAP  07/02/2021  . INFLUENZA VACCINE  Completed  . Hepatitis C Screening  Completed  . PNA vac Low Risk Adult  Completed     ----------------------------------------------------------------------------------------------------------------------------------------------------------------------------------------------------------------- Physical Exam BP 105/63   Pulse 71   Temp 98.4 F (36.9 C) (Oral)   Ht 6\' 1"  (1.854 m)   Wt 214 lb (97.1 kg)   BMI 28.23 kg/m   Physical Exam Constitutional:      Appearance: Normal appearance.  HENT:     Head: Normocephalic and atraumatic.  Eyes:     General: No scleral icterus. Cardiovascular:     Rate and Rhythm: Normal rate and regular rhythm.  Pulmonary:     Effort: Pulmonary effort is normal.     Breath sounds: Normal breath sounds.  Musculoskeletal:     Cervical back: Neck supple.  Skin:    General: Skin is warm and dry.  Neurological:     General: No focal deficit present.     Mental Status: He is alert.  Psychiatric:        Mood and Affect: Mood normal.        Behavior: Behavior normal.      ------------------------------------------------------------------------------------------------------------------------------------------------------------------------------------------------------------------- Assessment and Plan  Paroxysmal atrial fibrillation RRR on exam today. Denies symptoms at this time.  He'll continue to follow with cardiology.  He is adequately anticoagulated with xarelto.    Coronary artery disease Stable, denies anginal symptoms at this time.   Hypertension associated with diabetes ([place]) Blood pressure is at goal at for age and co-morbidities.  I recommend he continue current medications.  In addition they were instructed to follow a low sodium diet with regular exercise to help to maintain adequate  control of blood pressure.    OSA and COPD overlap syndrome ([place]) Followed by pulmonology.  Stable with current inhalers and use of CPAP  Type 2 diabetes mellitus with diabetic chronic kidney disease ([place]) Most recent A1c of  Lab Results  Component Value Date   HGBA1C 6.7 (H) 09/14/2019   indicates diabetes is well controlled.  he  will continue jardiance, refills provided.  Counseled on healthy, low carb diet and recommend frequent activity to help with maintaining good control of blood sugars.     Meds ordered this encounter  Medications  . empagliflozin (JARDIANCE) 10 MG TABS tablet    Sig: Take 10 mg by mouth daily. Schedule appt with PCP    Dispense:  90 tablet    Refill:  2  . gabapentin (NEURONTIN) 300 MG capsule    Sig: Take 2 capsules (600 mg total) by mouth at bedtime.    Dispense:  180 capsule    Refill:  2    Return in about 3 months (around 02/15/2020) for T2DM/HTN.    This visit occurred during the SARS-CoV-2 public health emergency.  Safety protocols were in place, including screening questions prior to the visit, additional usage of staff PPE, and extensive cleaning of exam room while observing appropriate contact time as  indicated for disinfecting solutions.

## 2019-11-16 NOTE — Assessment & Plan Note (Signed)
Blood pressure is at goal at for age and co-morbidities.  I recommend he continue current medications.  In addition they were instructed to follow a low sodium diet with regular exercise to help to maintain adequate control of blood pressure.

## 2019-11-16 NOTE — Telephone Encounter (Signed)
Orders have been placed for both the PET scan and surgery evaluation.   Will close encounter.

## 2019-11-16 NOTE — Telephone Encounter (Signed)
I called and discussed with patient. He would like a referral to surgery Please schedule PET scan and refer to cardiothoracic surgery for curative resection of lung nodule.  He had PFTs last year which who should be sufficient.   Thanks [name] MD [facility] 11/16/2019, 3:37 PM

## 2019-11-16 NOTE — Assessment & Plan Note (Signed)
Followed by pulmonology.  Stable with current inhalers and use of CPAP

## 2019-11-24 NOTE — Progress Notes (Signed)
[name]       [name],[name]             [phone]                    [place] Medical Record #[id number] Date of Birth: 12/29/1945  Referring: Luetta Nutting, DO Primary Care: Luetta Nutting, DO Primary Cardiologist: No primary care provider on file.  Chief Complaint:   No chief complaint on file.   History of Present Illness:    [name] 74 y.o. male presents for surgical evaluation of a right upper lobe pulmonary nodule that shown increased growth subsequently CT scans.  Given his smoking history he has been followed with screening CTs and this right upper lobe nodule was previously 4 mm and now grew to 1 cm over the course of a year.  Pulmonary standpoint his breathing is much improved now that he has lost weight which was intentional.  He does have a long history of cardiomyopathy.  He underwent CABG over 20 years ago and is followed closely by Dr. Gwenlyn Found, he also has a long history of atrial fibrillation has been followed by Dr. Rayann Heman.  From a functional standpoint he remains active by playing golf on a regular basis.  Does complain of some back pain but otherwise has no symptoms.    Smoking Hx: Former smoker   Zubrod Score: At the time of surgery this patient's most appropriate activity status/level should be described as: [x]     0    Normal activity, no symptoms []     1    Restricted in physical strenuous activity but ambulatory, able to do out light work []     2    Ambulatory and capable of self care, unable to do work activities, up and about               >50 % of waking hours                              []     3    Only limited self care, in bed greater than 50% of waking hours []     4    Completely disabled, no self care, confined to bed or chair []     5    Moribund   Past Medical History:  Diagnosis Date  . Atypical atrial flutter ([place])   . Coronary artery disease   . Diastolic dysfunction, left  ventricle 05/2018   Dr. Rayann Heman- Cardiologist  . HLD (hyperlipidemia)   . HTN (hypertension)   . OSA (obstructive sleep apnea)    uses CPAP  . Peripheral artery disease ([place])    pseudoaneurysm post afib ablation at [facility] 2011, s/p bilateral iliac stents  . Persistent atrial fibrillation ([place])   . Pre-diabetes   . Renal artery stenosis ([facility])    right renal artery PTA and stenting  . S/P coronary artery bypass graft x 7 11/16/96    Past Surgical History:  Procedure Laterality Date  . 2-D echocardiogram  03/25/2010   Normal left ventricular function. Mild MR, TR, trivial AR  . ATRIAL FIBRILLATION ABLATION  03/27/2010, 12/31/2010   [facility], Dr. Nadeen Landau  . BACK SURGERY  2007  . cardiac stress test  11/21/2009   Exercise capacity 5 METS. No significant ischemia demonstrated  . CARDIOVERSION N/A 03/15/2015   Procedure: CARDIOVERSION;  Surgeon:  Lorretta Harp, MD;  Location: [place];  Service: Cardiovascular;  Laterality: N/A;  . CARDIOVERSION N/A 07/10/2017   Procedure: CARDIOVERSION;  Surgeon: Pixie Casino, MD;  Location: [facility] ENDOSCOPY;  Service: Cardiovascular;  Laterality: N/A;  . CARDIOVERSION N/A 03/23/2018   Procedure: CARDIOVERSION;  Surgeon: Sanda Klein, MD;  Location: [facility] ENDOSCOPY;  Service: Cardiovascular;  Laterality: N/A;  . CORONARY ARTERY BYPASS GRAFT  1998  . ORCHIECTOMY  1981   for cancer  . TOOTH EXTRACTION  05/27/13   tooth extraction with bone graft    Family History  Problem Relation Age of Onset  . Cancer Mother   . Cancer Father   . Other Brother        NO MEDICAL PROBLEMS  . Other Son        NO MEDICAL PROBLEMS  . Leukemia Daughter 6       Recover and has no other problems     Social History   Tobacco Use  Smoking Status Former Smoker  . Packs/day: 1.50  . Years: 30.00  . Pack years: 45.00  . Types: Cigarettes, Cigars  . Quit date: 09/01/1994  . Years since quitting: 25.2  Smokeless Tobacco Never Used  Tobacco Comment   currently smokes a  cigar occ-10/27/18    Social History   Substance and Sexual Activity  Alcohol Use Yes  . Alcohol/week: 1.0 - 2.0 standard drinks  . Types: 1 - 2 Glasses of wine per week   Comment: occasional     No Known Allergies  Current Outpatient Medications  Medication Sig Dispense Refill  . acetaminophen (TYLENOL) 500 MG tablet Take 1,000 mg every 6 (six) hours as needed by mouth (for pain.).    [name] atorvastatin (LIPITOR) 10 MG tablet TAKE 1 TABLET(10 MG) BY MOUTH DAILY 90 tablet 2  . budesonide-formoterol (SYMBICORT) 160-4.5 MCG/ACT inhaler Inhale 2 puffs into the lungs 2 (two) times daily. 10.2 g 5  . dronedarone (MULTAQ) 400 MG tablet TAKE 1 TABLET BY MOUTH TWICE DAILY WITH MEAL 180 tablet 1  . empagliflozin (JARDIANCE) 10 MG TABS tablet Take 10 mg by mouth daily. Schedule appt with PCP 90 tablet 2  . Ferrous Sulfate (SLOW FE) 142 (45 Fe) MG TBCR Take 1 tablet by mouth 2 (two) times daily.    . furosemide (LASIX) 40 MG tablet TAKE 1 TABLET BY MOUTH AS NEEDED 30 tablet 3  . gabapentin (NEURONTIN) 300 MG capsule Take 2 capsules (600 mg total) by mouth at bedtime. 180 capsule 2  . lisinopril-hydrochlorothiazide (ZESTORETIC) 10-12.5 MG tablet TAKE 1 TABLET BY MOUTH DAILY 90 tablet 2  . metoprolol succinate (TOPROL-XL) 100 MG 24 hr tablet TAKE 1 TABLET(100 MG) BY MOUTH DAILY 90 tablet 3  . Multiple Vitamin (MULTIVITAMIN WITH MINERALS) TABS tablet Take 1 tablet by mouth every other day.     . niacin (NIASPAN) 1000 MG CR tablet TAKE 1 TABLET(1000 MG) BY MOUTH AT BEDTIME 90 tablet 3  . NON FORMULARY CPAP at night    . traMADol (ULTRAM) 50 MG tablet TAKE 1 TABLET BY MOUTH EVERY 12 HOURS AS NEEDED 30 tablet 0  . traZODone (DESYREL) 50 MG tablet TAKE 1/2 TO 1 TABLET BY MOUTH AT BEDTIME AS NEEDED FOR SLEEP 90 tablet 3  . VENTOLIN HFA 108 (90 Base) MCG/ACT inhaler INHALE 2 PUFFS INTO THE LUNGS EVERY 6 HOURS AS NEEDED FOR WHEEZING OR SHORTNESS OF BREATH 18 g 1  . XARELTO 20 MG TABS tablet TAKE 1 TABLET BY  MOUTH  EVERY DAY WITH SUPPER 90 tablet 2   No current facility-administered medications for this visit.    Review of Systems  Constitutional: Positive for weight loss.       Intentional weight loss  HENT: Negative.   Respiratory: Negative.   Cardiovascular: Negative.   Gastrointestinal: Negative.   Musculoskeletal: Positive for back pain.  Skin: Negative.   Neurological: Negative.      PHYSICAL EXAMINATION: There were no vitals taken for this visit. Physical Exam  Constitutional: He is oriented to person, place, and time. He appears well-developed and well-nourished. No distress.  HENT:  Head: Normocephalic and atraumatic.  Eyes: Conjunctivae and EOM are normal.  Neck: No tracheal deviation present.  Cardiovascular: Normal rate and regular rhythm.  No murmur heard. Respiratory: Effort normal and breath sounds normal. No respiratory distress.  GI: He exhibits no distension.  Musculoskeletal:        General: Normal range of motion.     Cervical back: Normal range of motion.  Neurological: He is alert and oriented to person, place, and time.  Skin: Skin is warm and dry. He is not diaphoretic.    Diagnostic Studies & Laboratory data:     Recent Radiology Findings:   CT Chest High Resolution  Result Date: 11/16/2019 CLINICAL DATA:  Interstitial lung disease. History of COPD/emphysema. EXAM: CT CHEST WITHOUT CONTRAST TECHNIQUE: Multidetector CT imaging of the chest was performed following the standard protocol without intravenous contrast. High resolution imaging of the lungs, as well as inspiratory and expiratory imaging, was performed. COMPARISON:  11/18/2018. FINDINGS: Cardiovascular: Atherosclerotic calcification of the aorta. Heart is at the upper limits of normal in size to mildly enlarged. No pericardial effusion. Mediastinum/Nodes: No pathologically enlarged mediastinal or axillary lymph nodes. Hilar regions are difficult to definitively evaluate without IV contrast.  Esophagus is grossly unremarkable. Lungs/Pleura: 10 mm apical segment right upper lobe nodule (8 x 11 mm, 3/42), previously 4 mm. Scattered subpleural nodules measure up to 4 mm, are unchanged and considered benign. Negative for subpleural reticulation, traction bronchiectasis/bronchiolectasis, ground-glass, architectural distortion or honeycombing. Mild centrilobular emphysema. No pleural fluid. Airway is unremarkable. Upper Abdomen: Liver margin is slightly irregular. Stones nearly fill the visualized portion of the gallbladder. Right adrenal gland is unremarkable. Fluid density left adrenal nodule measures 1.8 cm. Visualized portions of the kidneys, spleen, pancreas stomach and bowel are otherwise unremarkable. No upper abdominal adenopathy. Musculoskeletal: Degenerative changes in the spine. No worrisome lytic or sclerotic lesions. IMPRESSION: 1. Right upper lobe nodule is worrisome for primary bronchogenic carcinoma. These results will be called to the ordering clinician or representative by the Radiologist Assistant, and communication documented in the PACS or [employer]. 2. No evidence of interstitial lung disease.  Cirrhosis. 3. Cholelithiasis. 4. Left adrenal adenoma. 5.  Aortic atherosclerosis (ICD10-I70.0). 6.  Emphysema (ICD10-J43.9). Electronically Signed   By: Lorin Picket M.D.   On: 11/16/2019 13:30       I have independently reviewed the above radiology studies  and reviewed the findings with the patient.   Recent Lab Findings: Lab Results  Component Value Date   WBC 8.9 09/14/2019   HGB 16.8 09/14/2019   HCT 50.5 (H) 09/14/2019   PLT 226 09/14/2019   GLUCOSE 168 (H) 09/14/2019   CHOL 99 05/27/2018   TRIG 71 05/27/2018   HDL 44 05/27/2018   LDLDIRECT 48 01/25/2019   LDLCALC 40 05/27/2018   ALT 12 09/14/2019   AST 19 09/14/2019   NA 138 09/14/2019   K 4.2  09/14/2019   CL 99 09/14/2019   CREATININE 1.41 (H) 09/14/2019   BUN 18 09/14/2019   CO2 30 09/14/2019   TSH  1.185 06/30/2017   INR 2.0 04/04/2015   HGBA1C 6.7 (H) 09/14/2019     PFTs: Pending   Problem List: 1cm right upper lobe pulmonary nodule Scattered subpleural nodules, unchanged Left adrenal adenoma 1.8cm  Assessment / Plan:   74 yo male with 1cm right upper lobe pulmonary nodule, previously 62mm.  He has some scattered 77mm nodules, and a 1.8cm adrenal adenoma.  From a cardiology standpoint, he is managed by Dr. Gwenlyn Found and Dr Rayann Heman and will need clearance to proceed with any surgical procedures given his heart failure and atrial fibrillation.  He is currently on Xarelto and this will need to be held for any procedures but he does have experience giving himself Lovenox injections.    He will need repeat PFTs, and a PET/CT (which is scheduled for 3/29) mostly to evaluate his mediastinum, and the adrenal adenoma.  He would be an ideal candidate for a staged navigational bronchoscopy with Dr. Valeta Harms, followed by a robotic lobectomy if this proves to be a primary lung cancer.  I think that the nodule is too deep for a surgical biopsy.       I  spent 40 minutes with  the patient face to face and greater then 50% of the time was spent in counseling and coordination of care.    Lajuana Matte 11/24/2019 12:35 PM

## 2019-11-25 ENCOUNTER — Other Ambulatory Visit: Payer: Self-pay | Admitting: Thoracic Surgery (Cardiothoracic Vascular Surgery)

## 2019-11-25 ENCOUNTER — Other Ambulatory Visit: Payer: Self-pay

## 2019-11-25 ENCOUNTER — Encounter: Payer: Self-pay | Admitting: Thoracic Surgery (Cardiothoracic Vascular Surgery)

## 2019-11-25 ENCOUNTER — Institutional Professional Consult (permissible substitution): Payer: Medicare Other | Admitting: Thoracic Surgery (Cardiothoracic Vascular Surgery)

## 2019-11-25 VITALS — BP 123/76 | HR 73 | Temp 98.2°F | Resp 24 | Ht 72.0 in | Wt 210.0 lb

## 2019-11-25 DIAGNOSIS — I480 Paroxysmal atrial fibrillation: Secondary | ICD-10-CM

## 2019-11-25 DIAGNOSIS — R911 Solitary pulmonary nodule: Secondary | ICD-10-CM

## 2019-11-28 ENCOUNTER — Other Ambulatory Visit: Payer: Self-pay

## 2019-11-28 ENCOUNTER — Encounter (HOSPITAL_COMMUNITY)
Admission: RE | Admit: 2019-11-28 | Discharge: 2019-11-28 | Disposition: A | Discharge status: Home / Self Care | Payer: Medicare Other | Source: Ambulatory Visit | Attending: Pulmonary Disease | Admitting: Pulmonary Disease

## 2019-11-28 DIAGNOSIS — K769 Liver disease, unspecified: Secondary | ICD-10-CM | POA: Diagnosis not present

## 2019-11-28 DIAGNOSIS — K8020 Calculus of gallbladder without cholecystitis without obstruction: Secondary | ICD-10-CM | POA: Insufficient documentation

## 2019-11-28 DIAGNOSIS — I700 Atherosclerosis of aorta: Secondary | ICD-10-CM | POA: Insufficient documentation

## 2019-11-28 DIAGNOSIS — R911 Solitary pulmonary nodule: Secondary | ICD-10-CM | POA: Diagnosis not present

## 2019-11-28 DIAGNOSIS — IMO0001 Reserved for inherently not codable concepts without codable children: Secondary | ICD-10-CM

## 2019-11-28 LAB — GLUCOSE, CAPILLARY: Glucose-Capillary: 130 mg/dL — ABNORMAL HIGH (ref 70–99)

## 2019-11-28 MED ORDER — FLUDEOXYGLUCOSE F - 18 (FDG) INJECTION
10.9300 | Freq: Once | INTRAVENOUS | Status: AC | PRN
  Administered 2019-11-28: 10.93 via INTRAVENOUS

## 2019-11-29 NOTE — Telephone Encounter (Signed)
I have sent message to patient letting him know that once we get the results we will contact him.  Patients mychart message:  First, thank you Dr. Vaughan Browner for sched. annual CT scans for me, enabling a detection of what appears to be an early stage cancer.  I am extremely grateful. I would like to briefly talk to someone about the PET scan results, just for clarification.  Who should I hear from/talk to about these results? Thanks! [name]

## 2019-12-01 ENCOUNTER — Other Ambulatory Visit: Payer: Self-pay | Admitting: Cardiovascular Disease

## 2019-12-01 DIAGNOSIS — I480 Paroxysmal atrial fibrillation: Secondary | ICD-10-CM

## 2019-12-02 NOTE — Telephone Encounter (Signed)
73 M 97.1 kg, Scr 1.41 (1/21), CrCl 66, LOV 07/2019 Gwenlyn Found

## 2019-12-05 ENCOUNTER — Encounter (HOSPITAL_COMMUNITY): Payer: Medicare Other

## 2019-12-05 NOTE — Telephone Encounter (Addendum)
[name],  The PET scan shows activity in the nodule in right lung that raises the concern for a malignancy. The good news is there no activity anywhere else in the body so it appears to be an early stage cancer. I note that you have seen Dr. Kipp Brood and are scheduled to get pulmonary function tests and see Dr Valeta Harms soon. Based on these we can come up with a plan for next steps.  [name] MD [facility] Please see [email or web address] for pager details.  12/05/2019, 5:57 AM

## 2019-12-08 ENCOUNTER — Encounter (HOSPITAL_COMMUNITY): Payer: Medicare Other

## 2019-12-12 ENCOUNTER — Other Ambulatory Visit (HOSPITAL_COMMUNITY)
Admission: RE | Admit: 2019-12-12 | Discharge: 2019-12-12 | Disposition: A | Discharge status: Home / Self Care | Payer: Medicare Other | Source: Ambulatory Visit | Attending: Pulmonary Disease | Admitting: Pulmonary Disease

## 2019-12-12 DIAGNOSIS — Z01812 Encounter for preprocedural laboratory examination: Secondary | ICD-10-CM | POA: Diagnosis not present

## 2019-12-12 DIAGNOSIS — Z20822 Contact with and (suspected) exposure to covid-19: Secondary | ICD-10-CM | POA: Insufficient documentation

## 2019-12-12 LAB — SARS CORONAVIRUS 2 (TAT 6-24 HRS): SARS Coronavirus 2: NEGATIVE

## 2019-12-14 ENCOUNTER — Encounter (HOSPITAL_COMMUNITY): Payer: Self-pay | Admitting: Physician Assistant

## 2019-12-14 ENCOUNTER — Other Ambulatory Visit: Payer: Self-pay

## 2019-12-14 ENCOUNTER — Ambulatory Visit (HOSPITAL_COMMUNITY)
Admission: RE | Admit: 2019-12-14 | Discharge: 2019-12-14 | Disposition: A | Discharge status: Home / Self Care | Payer: Medicare Other | Source: Ambulatory Visit | Attending: Nurse Practitioner | Admitting: Nurse Practitioner

## 2019-12-14 ENCOUNTER — Telehealth: Payer: Self-pay

## 2019-12-14 VITALS — BP 130/66 | HR 69 | Ht 72.0 in | Wt 213.4 lb

## 2019-12-14 DIAGNOSIS — I739 Peripheral vascular disease, unspecified: Secondary | ICD-10-CM | POA: Diagnosis not present

## 2019-12-14 DIAGNOSIS — G4733 Obstructive sleep apnea (adult) (pediatric): Secondary | ICD-10-CM | POA: Diagnosis not present

## 2019-12-14 DIAGNOSIS — Z7901 Long term (current) use of anticoagulants: Secondary | ICD-10-CM | POA: Insufficient documentation

## 2019-12-14 DIAGNOSIS — Z7984 Long term (current) use of oral hypoglycemic drugs: Secondary | ICD-10-CM | POA: Insufficient documentation

## 2019-12-14 DIAGNOSIS — I484 Atypical atrial flutter: Secondary | ICD-10-CM | POA: Insufficient documentation

## 2019-12-14 DIAGNOSIS — I10 Essential (primary) hypertension: Secondary | ICD-10-CM | POA: Insufficient documentation

## 2019-12-14 DIAGNOSIS — Z951 Presence of aortocoronary bypass graft: Secondary | ICD-10-CM | POA: Insufficient documentation

## 2019-12-14 DIAGNOSIS — Z95820 Peripheral vascular angioplasty status with implants and grafts: Secondary | ICD-10-CM | POA: Insufficient documentation

## 2019-12-14 DIAGNOSIS — I255 Ischemic cardiomyopathy: Secondary | ICD-10-CM | POA: Insufficient documentation

## 2019-12-14 DIAGNOSIS — D6869 Other thrombophilia: Secondary | ICD-10-CM | POA: Diagnosis not present

## 2019-12-14 DIAGNOSIS — I4819 Other persistent atrial fibrillation: Secondary | ICD-10-CM | POA: Diagnosis not present

## 2019-12-14 DIAGNOSIS — Z79899 Other long term (current) drug therapy: Secondary | ICD-10-CM | POA: Insufficient documentation

## 2019-12-14 DIAGNOSIS — Z7951 Long term (current) use of inhaled steroids: Secondary | ICD-10-CM | POA: Diagnosis not present

## 2019-12-14 DIAGNOSIS — E785 Hyperlipidemia, unspecified: Secondary | ICD-10-CM | POA: Diagnosis not present

## 2019-12-14 DIAGNOSIS — Z87891 Personal history of nicotine dependence: Secondary | ICD-10-CM | POA: Diagnosis not present

## 2019-12-14 DIAGNOSIS — I2510 Atherosclerotic heart disease of native coronary artery without angina pectoris: Secondary | ICD-10-CM | POA: Insufficient documentation

## 2019-12-14 DIAGNOSIS — I480 Paroxysmal atrial fibrillation: Secondary | ICD-10-CM | POA: Diagnosis present

## 2019-12-14 DIAGNOSIS — R7303 Prediabetes: Secondary | ICD-10-CM | POA: Diagnosis not present

## 2019-12-14 NOTE — Telephone Encounter (Signed)
Orders for sleep equipment signed on 12/07/2019 and faxed back to [facility].  

## 2019-12-14 NOTE — Progress Notes (Signed)
Primary Care Physician: Luetta Nutting, DO Referring Physician: Dr. Gwenlyn Found EP: Dr. Casandra Doffing [name] is a 74 y.o. male with a h/o  PAF, OSA using cpap, HTN, CAD, s/p bypass, s/p ablation x 2, that is here for f/u, on Multaq, as he has had afib since last week occurring on vacation  while he was at the beach.  He is tolerating fairly well, but notices more nervousness. He tracks his HR on his phone app. He saw Dr. Rayann Heman  last  04/04/15 at which time he was offered repeat ablation,  Multaq, sotalol or tikosyn. He chose multaq and has kept him in rhythm  X almost 3 years. He did require a cardioversion 7 months ago.  Otherwise health has been at his baseline. Continues on xarelto without any missed doses. His qt is of concern usually around 500 ms,(RBBB contributing ), longer today in atrial flutter. He is fatigued out of rhythm and prefers to be in SR. He also prefers to avoid another ablation.   F/u in afib clinic,7/30. Unfortunately, he did not convert with cardioversion, but 3 days later, he returned to [place] , and continues to stay in rhythm. He feels improved. I discussed with Dr. Rayann Heman  prior to [place] as his options are limited. He  thought repeat cardioversion was the best option  for pt.   F/u virtual afib clinic 01/28/19. Pt reports no major issues with afib. Will feel his HR pick up at times but can get it back in rhythm in a matter of minutes with deep breaths. He had a prolonged URI in December, was tested for covid antibodies recently and was negative. He is having to use prn lasix once a week and will lose 3-4 lbs but has gotten into the habit of eating pretzels daily. He is now able to get out and golf and he is happy about that. He is taking covid precautions.  Follow up in the AF clinic 12/14/19. Patient reports he is doing very well since his last visit. He has lost between 15-20 lbs by reducing his portion sizes. He has had two episodes of heart racing which resolved in <2 minutes.  He denies bleeding issues with anticoagulation.   Today, he denies symptoms of palpitations, chest pain, shortness of breath, orthopnea, PND, lower extremity edema, dizziness, presyncope, syncope, or neurologic sequela. The patient is tolerating medications without difficulties and is otherwise without complaint today.   Past Medical History:  Diagnosis Date  . Atypical atrial flutter ([place])   . Coronary artery disease   . Diastolic dysfunction, left ventricle 05/2018   Dr. Rayann Heman- Cardiologist  . HLD (hyperlipidemia)   . HTN (hypertension)   . OSA (obstructive sleep apnea)    uses CPAP  . Peripheral artery disease ([place])    pseudoaneurysm post afib ablation at [facility] 2011, s/p bilateral iliac stents  . Persistent atrial fibrillation ([place])   . Pre-diabetes   . Renal artery stenosis ([facility])    right renal artery PTA and stenting  . S/P coronary artery bypass graft x 7 11/16/96   Past Surgical History:  Procedure Laterality Date  . 2-D echocardiogram  03/25/2010   Normal left ventricular function. Mild MR, TR, trivial AR  . ATRIAL FIBRILLATION ABLATION  03/27/2010, 12/31/2010   [facility], Dr. Nadeen Landau  . BACK SURGERY  2007  . cardiac stress test  11/21/2009   Exercise capacity 5 METS. No significant ischemia demonstrated  . CARDIOVERSION N/A 03/15/2015   Procedure: CARDIOVERSION;  Surgeon: Lorretta Harp, MD;  Location: [facility] ENDOSCOPY;  Service: Cardiovascular;  Laterality: N/A;  . CARDIOVERSION N/A 07/10/2017   Procedure: CARDIOVERSION;  Surgeon: Pixie Casino, MD;  Location: [facility] ENDOSCOPY;  Service: Cardiovascular;  Laterality: N/A;  . CARDIOVERSION N/A 03/23/2018   Procedure: CARDIOVERSION;  Surgeon: Sanda Klein, MD;  Location: [facility] ENDOSCOPY;  Service: Cardiovascular;  Laterality: N/A;  . CORONARY ARTERY BYPASS GRAFT  1998  . ORCHIECTOMY  1981   for cancer  . TOOTH EXTRACTION  05/27/13   tooth extraction with bone graft    Current Outpatient Medications  Medication Sig Dispense Refill    . acetaminophen (TYLENOL) 500 MG tablet Take 1,000 mg every 6 (six) hours as needed by mouth (for pain.).    [name] atorvastatin (LIPITOR) 10 MG tablet TAKE 1 TABLET(10 MG) BY MOUTH DAILY 90 tablet 2  . budesonide-formoterol (SYMBICORT) 160-4.5 MCG/ACT inhaler Inhale 2 puffs into the lungs 2 (two) times daily. 10.2 g 5  . dronedarone (MULTAQ) 400 MG tablet TAKE 1 TABLET BY MOUTH TWICE DAILY WITH MEAL 180 tablet 1  . empagliflozin (JARDIANCE) 10 MG TABS tablet Take 10 mg by mouth daily. Schedule appt with PCP 90 tablet 2  . Ferrous Sulfate (SLOW FE) 142 (45 Fe) MG TBCR Take 1 tablet by mouth 2 (two) times daily.    . furosemide (LASIX) 40 MG tablet TAKE 1 TABLET BY MOUTH AS NEEDED 30 tablet 3  . gabapentin (NEURONTIN) 300 MG capsule Take 2 capsules (600 mg total) by mouth at bedtime. 180 capsule 2  . lisinopril-hydrochlorothiazide (ZESTORETIC) 10-12.5 MG tablet TAKE 1 TABLET BY MOUTH DAILY 90 tablet 1  . metoprolol succinate (TOPROL-XL) 100 MG 24 hr tablet TAKE 1 TABLET(100 MG) BY MOUTH DAILY 90 tablet 3  . Multiple Vitamin (MULTIVITAMIN WITH MINERALS) TABS tablet Take 1 tablet by mouth every other day.     . niacin (NIASPAN) 1000 MG CR tablet TAKE 1 TABLET(1000 MG) BY MOUTH AT BEDTIME 90 tablet 3  . NON FORMULARY CPAP at night    . traMADol (ULTRAM) 50 MG tablet TAKE 1 TABLET BY MOUTH EVERY 12 HOURS AS NEEDED 30 tablet 0  . traZODone (DESYREL) 50 MG tablet TAKE 1/2 TO 1 TABLET BY MOUTH AT BEDTIME AS NEEDED FOR SLEEP 90 tablet 3  . VENTOLIN HFA 108 (90 Base) MCG/ACT inhaler INHALE 2 PUFFS INTO THE LUNGS EVERY 6 HOURS AS NEEDED FOR WHEEZING OR SHORTNESS OF BREATH 18 g 1  . XARELTO 20 MG TABS tablet TAKE 1 TABLET BY MOUTH EVERY DAY WITH SUPPER 90 tablet 1   No current facility-administered medications for this visit.    No Known Allergies  Social History   Socioeconomic History  . Marital status: Divorced    Spouse name: Not on file  . Number of children: 2  . Years of education: 34  .  Highest education level: Associate degree: academic program  Occupational History  . 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 Smoking status: Former Smoker    Packs/day: 1.50    Years: 30.00    Pack years: 45.00    Types: Cigarettes, Cigars    Quit date: 09/01/1994    Years since quitting: 25.3  . Smokeless tobacco: Never Used  . Tobacco comment: currently smokes a cigar occ-10/27/18  Substance and Sexual Activity  . Alcohol use: Yes    Alcohol/week: 1.0 - 2.0 standard drinks    Types: 1 - 2 Glasses of wine  per week    Comment: occasional  . Drug use: No  . Sexual activity: Yes  Other Topics Concern  . Not on file  Social History Narrative   Pt lives in [place] alone.  Divorced. 2 cups of coffee in the morning.   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 Difficulty of Paying Living Expenses:   Food Insecurity:   . Worried About Charity fundraiser in the Last Year:   . Arboriculturist in the Last Year:   Transportation Needs:   . Film/video editor (Medical):   [name] Lack of Transportation (Non-Medical):   Physical Activity:   . Days of Exercise per Week:   . Minutes of Exercise per Session:   Stress:   . Feeling of Stress :   Social Connections:   . Frequency of Communication with Friends and Family:   . Frequency of Social Gatherings with Friends and Family:   . Attends Religious Services:   . Active Member of Clubs or Organizations:   . Attends Archivist Meetings:   [name] Marital Status:   Intimate Partner Violence:   . Fear of Current or Ex-Partner:   . Emotionally Abused:   [name] Physically Abused:   . Sexually Abused:     Family History  Problem Relation Age of Onset  . Cancer Mother   . Cancer Father   . Other Brother        NO MEDICAL PROBLEMS  . Other Son        NO MEDICAL PROBLEMS  . Leukemia Daughter 6       Recover and has no other problems     ROS- All systems are reviewed and negative except as per the HPI above  Physical Exam: There were no vitals filed for this visit. Wt Readings from Last 3 Encounters:  11/25/19 210 lb (95.3 kg)  11/15/19 214 lb (97.1 kg)  07/13/19 228 lb (103.4 kg)    Labs: Lab Results  Component Value Date   NA 138 09/14/2019   K 4.2 09/14/2019   CL 99 09/14/2019   CO2 30 09/14/2019   GLUCOSE 168 (H) 09/14/2019   BUN 18 09/14/2019   CREATININE 1.41 (H) 09/14/2019   CALCIUM 10.3 09/14/2019   Lab Results  Component Value Date   INR 2.0 04/04/2015   Lab Results  Component Value Date   CHOL 99 05/27/2018   HDL 44 05/27/2018   LDLCALC 40 05/27/2018   TRIG 71 05/27/2018    GEN- The patient is well appearing, alert and oriented x 3 today.   HEENT-head normocephalic, atraumatic, sclera clear, conjunctiva pink, hearing intact, trachea midline. Lungs- Clear to ausculation bilaterally, normal work of breathing Heart- Regular rate and rhythm, no murmurs, rubs or gallops  GI- soft, NT, ND, + BS Extremities- no clubbing, cyanosis, or edema MS- no significant deformity or atrophy Skin- no rash or lesion Psych- euthymic mood, full affect Neuro- strength and sensation are intact   EKG- SR HR 69, PACs, RBBB, PR 120, QRS 134, QTc 495  Echo 07/27/19 1. Left ventricular ejection fraction, by visual estimation, is 40 to  45%. The left ventricle has mild to moderately decreased function. There  is no left ventricular hypertrophy.  2. Mid and apical anterior septum, apical lateral segment, apical  anterior segment, and apex are hypokinetic.  3. Definity contrast agent was given IV to delineate the left  ventricular  endocardial borders.  4. Elevated left atrial pressure.  5. Left ventricular diastolic parameters are consistent with Grade II  diastolic dysfunction (pseudonormalization).  6. The left ventricle demonstrates regional wall motion abnormalities.  7. Global right ventricle has  moderately reduced systolic function.The  right ventricular size is normal. No increase in right ventricular wall  thickness.  8. Left atrial size was mildly dilated.  9. Right atrial size was mildly dilated.  10. The mitral valve is normal in structure. No evidence of mitral valve  regurgitation.  11. The tricuspid valve is normal in structure. Tricuspid valve  regurgitation mild-moderate.  12. The aortic valve is normal in structure. Aortic valve regurgitation is  not visualized.  13. The pulmonic valve was grossly normal. Pulmonic valve regurgitation is  not visualized.  14. Mildly elevated pulmonary artery systolic pressure.  15. The inferior vena cava is dilated in size with >50% respiratory  variability, suggesting right atrial pressure of 8 mmHg.  16. No intracardiac thrombi or masses were visualized.   Epic records reviewed   Assessment and Plan: 1.  Persistent atrial fibrillation/atypical atrial flutter Patient appears to be maintaining SR. Continue Multaq 400 mg BID Continue Xarelto 20 mg daily Continue Toprol 100 mg daily  This patients CHA2DS2-VASc Score and unadjusted Ischemic Stroke Rate (% per year) is equal to 4.8 % stroke rate/year from a score of 4  Above score calculated as 1 point each if present [CHF, HTN, DM, Vascular=MI/PAD/Aortic Plaque, Age if 65-74, or Male] Above score calculated as 2 points each if present [Age > 75, or Stroke/TIA/TE]  2. HTN Stable, no changes today.   3. CAD/ischemic CM S/p CABG 1998. EF 45-50% No anginal symptoms.  4. OSA The importance of adequate treatment of sleep apnea was discussed today in order to improve our ability to maintain sinus rhythm long term. Patient reports compliance with CPAP therapy. Followed by Dr Claiborne Billings.   Follow up with Dr Claiborne Billings as scheduled and Dr Gwenlyn Found per recall. AF clinic in one year.    [facility] [address] [place], [place]  [address] [phone]

## 2019-12-15 ENCOUNTER — Ambulatory Visit: Payer: Medicare Other | Admitting: Pulmonary Disease

## 2019-12-15 ENCOUNTER — Encounter: Payer: Self-pay | Admitting: Pulmonary Disease

## 2019-12-15 ENCOUNTER — Ambulatory Visit (HOSPITAL_COMMUNITY)
Admission: RE | Admit: 2019-12-15 | Discharge: 2019-12-15 | Disposition: A | Discharge status: Home / Self Care | Payer: Medicare Other | Source: Ambulatory Visit | Attending: Thoracic Surgery (Cardiothoracic Vascular Surgery) | Admitting: Thoracic Surgery (Cardiothoracic Vascular Surgery)

## 2019-12-15 VITALS — BP 116/66 | HR 70 | Temp 97.3°F | Ht 72.0 in | Wt 214.0 lb

## 2019-12-15 DIAGNOSIS — J988 Other specified respiratory disorders: Secondary | ICD-10-CM | POA: Insufficient documentation

## 2019-12-15 DIAGNOSIS — R942 Abnormal results of pulmonary function studies: Secondary | ICD-10-CM

## 2019-12-15 DIAGNOSIS — Z720 Tobacco use: Secondary | ICD-10-CM | POA: Diagnosis not present

## 2019-12-15 DIAGNOSIS — I480 Paroxysmal atrial fibrillation: Secondary | ICD-10-CM | POA: Diagnosis not present

## 2019-12-15 DIAGNOSIS — R911 Solitary pulmonary nodule: Secondary | ICD-10-CM

## 2019-12-15 LAB — PULMONARY FUNCTION TEST
DL/VA % pred: 85 %
DL/VA: 3.43 ml/min/mmHg/L
DLCO unc % pred: 71 %
DLCO unc: 19.44 ml/min/mmHg
FEF 25-75 Post: 2.03 L/sec
FEF 25-75 Pre: 1.46 L/sec
FEF2575-%Change-Post: 38 %
FEF2575-%Pred-Post: 77 %
FEF2575-%Pred-Pre: 55 %
FEV1-%Change-Post: 9 %
FEV1-%Pred-Post: 77 %
FEV1-%Pred-Pre: 71 %
FEV1-Post: 2.71 L
FEV1-Pre: 2.48 L
FEV1FVC-%Change-Post: 5 %
FEV1FVC-%Pred-Pre: 94 %
FEV6-%Change-Post: 3 %
FEV6-%Pred-Post: 80 %
FEV6-%Pred-Pre: 77 %
FEV6-Post: 3.6 L
FEV6-Pre: 3.47 L
FEV6FVC-%Change-Post: 0 %
FEV6FVC-%Pred-Post: 103 %
FEV6FVC-%Pred-Pre: 102 %
FVC-%Change-Post: 2 %
FVC-%Pred-Post: 77 %
FVC-%Pred-Pre: 75 %
FVC-Post: 3.67 L
FVC-Pre: 3.57 L
Post FEV1/FVC ratio: 74 %
Post FEV6/FVC ratio: 98 %
Pre FEV1/FVC ratio: 70 %
Pre FEV6/FVC Ratio: 97 %
RV % pred: 118 %
RV: 3.05 L
TLC % pred: 88 %
TLC: 6.59 L

## 2019-12-15 MED ORDER — ALBUTEROL SULFATE (2.5 MG/3ML) 0.083% IN NEBU
2.5000 mg | INHALATION_SOLUTION | Freq: Once | RESPIRATORY_TRACT | Status: AC
  Administered 2019-12-15: 2.5 mg via RESPIRATORY_TRACT

## 2019-12-15 NOTE — Progress Notes (Signed)
Synopsis: Referred in Dr. Kipp Brood for lung nodule, PCP: By Luetta Nutting, DO, primary pulmonologist Dr. Kimber Relic.  Subjective:   PATIENT ID: [name] GENDER: male DOB: [birth year], MRN: [id number]  Chief Complaint  Patient presents with  . Consult    Referred by Dr. Vaughan Browner for Lung Nodule. Patient reports that his breathing is doing well at this time.     This is a 74 year old gentleman history of a flutter, coronary artery disease, chronic diastolic heart failure, hypertension, hyperlipidemia, OSA.  Initially seen and evaluated by Dr. Kimber Relic undergoing high-resolution CT imaging of the chest.  He had follow-up imaging in 2021, November 16, 2019 which revealed a right upper lobe pulmonary nodule 8 x 11 mm concerning for primary bronchogenic carcinoma.  Patient underwent pet imaging on 11/28/2019 which revealed a right upper lobe nodule SUV max 5.0.  Patient was then referred to cardiothoracic surgery for evaluation of surgical resection.  Patient seen on 11/25/2019 by Dr. Kipp Brood.  Dr. Kipp Brood felt as if the nodule was too deep for surgical lung biopsy.  And that would be better to proceed with navigational bronchoscopy first for tissue diagnosis.    Past Medical History:  Diagnosis Date  . Atypical atrial flutter ([place])   . Coronary artery disease   . Diastolic dysfunction, left ventricle 05/2018   Dr. Rayann Heman- Cardiologist  . HLD (hyperlipidemia)   . HTN (hypertension)   . OSA (obstructive sleep apnea)    uses CPAP  . Peripheral artery disease ([place])    pseudoaneurysm post afib ablation at [facility] 2011, s/p bilateral iliac stents  . Persistent atrial fibrillation ([place])   . Pre-diabetes   . Renal artery stenosis ([facility])    right renal artery PTA and stenting  . S/P coronary artery bypass graft x 7 11/16/96     Family History  Problem Relation Age of Onset  . Cancer Mother   . Cancer Father   . Other Brother        NO MEDICAL PROBLEMS  . Other Son        NO MEDICAL PROBLEMS    . Leukemia Daughter 6       Recover and has no other problems     Past Surgical History:  Procedure Laterality Date  . 2-D echocardiogram  03/25/2010   Normal left ventricular function. Mild MR, TR, trivial AR  . ATRIAL FIBRILLATION ABLATION  03/27/2010, 12/31/2010   [facility], Dr. Nadeen Landau  . BACK SURGERY  2007  . cardiac stress test  11/21/2009   Exercise capacity 5 METS. No significant ischemia demonstrated  . CARDIOVERSION N/A 03/15/2015   Procedure: CARDIOVERSION;  Surgeon: Lorretta Harp, MD;  Location: [facility] ENDOSCOPY;  Service: Cardiovascular;  Laterality: N/A;  . CARDIOVERSION N/A 07/10/2017   Procedure: CARDIOVERSION;  Surgeon: Pixie Casino, MD;  Location: [facility] ENDOSCOPY;  Service: Cardiovascular;  Laterality: N/A;  . CARDIOVERSION N/A 03/23/2018   Procedure: CARDIOVERSION;  Surgeon: Sanda Klein, MD;  Location: [facility] ENDOSCOPY;  Service: Cardiovascular;  Laterality: N/A;  . CORONARY ARTERY BYPASS GRAFT  1998  . ORCHIECTOMY  1981   for cancer  . TOOTH EXTRACTION  05/27/13   tooth extraction with bone graft    Social History   Socioeconomic History  . Marital status: Divorced    Spouse name: Not on file  . Number of children: 2  . Years of education: 61  . Highest education level: Associate degree: academic program  Occupational History  . Occupation: retired    Comment: Community education officer  of [employer]  Tobacco Use  . Smoking status: Former Smoker    Packs/day: 1.50    Years: 30.00    Pack years: 45.00    Types: Cigarettes, Cigars    Quit date: 09/01/1994    Years since quitting: 25.3  . Smokeless tobacco: Never Used  . Tobacco comment: currently smokes a cigar occ-10/27/18  Substance and Sexual Activity  . Alcohol use: Yes    Alcohol/week: 1.0 - 2.0 standard drinks    Types: 1 - 2 Glasses of wine per week    Comment: occasional  . Drug use: No  . Sexual activity: Yes  Other Topics Concern  . Not on file  Social History Narrative   Pt lives in [place] alone.  Divorced.  2 cups of coffee in the morning.   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 Difficulty of Paying Living Expenses:   Food Insecurity:   . Worried About Charity fundraiser in the Last Year:   . Arboriculturist in the Last Year:   Transportation Needs:   . Film/video editor (Medical):   [name] Lack of Transportation (Non-Medical):   Physical Activity:   . Days of Exercise per Week:   . Minutes of Exercise per Session:   Stress:   . Feeling of Stress :   Social Connections:   . Frequency of Communication with Friends and Family:   . Frequency of Social Gatherings with Friends and Family:   . Attends Religious Services:   . Active Member of Clubs or Organizations:   . Attends Archivist Meetings:   [name] Marital Status:   Intimate Partner Violence:   . Fear of Current or Ex-Partner:   . Emotionally Abused:   [name] Physically Abused:   . Sexually Abused:      No Known Allergies   Outpatient Medications Prior to Visit  Medication Sig Dispense Refill  . acetaminophen (TYLENOL) 500 MG tablet Take 1,000 mg every 6 (six) hours as needed by mouth (for pain.).    [name] atorvastatin (LIPITOR) 10 MG tablet TAKE 1 TABLET(10 MG) BY MOUTH DAILY 90 tablet 2  . budesonide-formoterol (SYMBICORT) 160-4.5 MCG/ACT inhaler Inhale 2 puffs into the lungs 2 (two) times daily. 10.2 g 5  . dronedarone (MULTAQ) 400 MG tablet TAKE 1 TABLET BY MOUTH TWICE DAILY WITH MEAL 180 tablet 1  . empagliflozin (JARDIANCE) 10 MG TABS tablet Take 10 mg by mouth daily. Schedule appt with PCP 90 tablet 2  . Ferrous Sulfate (SLOW FE) 142 (45 Fe) MG TBCR Take 1 tablet by mouth 2 (two) times daily.    . furosemide (LASIX) 40 MG tablet TAKE 1 TABLET BY MOUTH AS NEEDED 30 tablet 3  . gabapentin (NEURONTIN) 300 MG capsule Take 2 capsules (600 mg total) by mouth at bedtime. 180 capsule 2  . lisinopril-hydrochlorothiazide (ZESTORETIC) 10-12.5 MG tablet TAKE 1  TABLET BY MOUTH DAILY 90 tablet 1  . metoprolol succinate (TOPROL-XL) 100 MG 24 hr tablet TAKE 1 TABLET(100 MG) BY MOUTH DAILY 90 tablet 3  . Multiple Vitamin (MULTIVITAMIN WITH MINERALS) TABS tablet Take 1 tablet by mouth every other day.     . niacin (NIASPAN) 1000 MG CR tablet TAKE 1 TABLET(1000 MG) BY MOUTH AT BEDTIME 90 tablet 3  . NON FORMULARY CPAP at night    . traMADol (ULTRAM) 50 MG tablet TAKE 1 TABLET BY MOUTH EVERY 12 HOURS  AS NEEDED 30 tablet 0  . traZODone (DESYREL) 50 MG tablet TAKE 1/2 TO 1 TABLET BY MOUTH AT BEDTIME AS NEEDED FOR SLEEP 90 tablet 3  . VENTOLIN HFA 108 (90 Base) MCG/ACT inhaler INHALE 2 PUFFS INTO THE LUNGS EVERY 6 HOURS AS NEEDED FOR WHEEZING OR SHORTNESS OF BREATH 18 g 1  . XARELTO 20 MG TABS tablet TAKE 1 TABLET BY MOUTH EVERY DAY WITH SUPPER 90 tablet 1   No facility-administered medications prior to visit.    Review of Systems  Constitutional: Negative for chills, fever, malaise/fatigue and weight loss.  HENT: Negative for hearing loss, sore throat and tinnitus.   Eyes: Negative for blurred vision and double vision.  Respiratory: Negative for cough, hemoptysis, sputum production, shortness of breath, wheezing and stridor.   Cardiovascular: Negative for chest pain, palpitations, orthopnea, leg swelling and PND.  Gastrointestinal: Negative for abdominal pain, constipation, diarrhea, heartburn, nausea and vomiting.  Genitourinary: Negative for dysuria, hematuria and urgency.  Musculoskeletal: Negative for joint pain and myalgias.  Skin: Negative for itching and rash.  Neurological: Negative for dizziness, tingling, weakness and headaches.  Endo/Heme/Allergies: Negative for environmental allergies. Does not bruise/bleed easily.  Psychiatric/Behavioral: Negative for depression. The patient is not nervous/anxious and does not have insomnia.   All other systems reviewed and are negative.    Objective:  Physical Exam Vitals reviewed.  Constitutional:       General: He is not in acute distress.    Appearance: He is well-developed.  HENT:     Head: Normocephalic and atraumatic.  Eyes:     General: No scleral icterus.    Conjunctiva/sclera: Conjunctivae normal.     Pupils: Pupils are equal, round, and reactive to light.  Neck:     Vascular: No JVD.     Trachea: No tracheal deviation.  Cardiovascular:     Rate and Rhythm: Normal rate and regular rhythm.     Heart sounds: Normal heart sounds. No murmur.  Pulmonary:     Effort: Pulmonary effort is normal. No tachypnea, accessory muscle usage or respiratory distress.     Breath sounds: Normal breath sounds. No stridor. No wheezing, rhonchi or rales.  Abdominal:     General: Bowel sounds are normal. There is no distension.     Palpations: Abdomen is soft.     Tenderness: There is no abdominal tenderness.  Musculoskeletal:        General: No tenderness.     Cervical back: Neck supple.  Lymphadenopathy:     Cervical: No cervical adenopathy.  Skin:    General: Skin is warm and dry.     Capillary Refill: Capillary refill takes less than 2 seconds.     Findings: No rash.  Neurological:     Mental Status: He is alert and oriented to person, place, and time.  Psychiatric:        Behavior: Behavior normal.      Vitals:   12/15/19 1031  BP: 116/66  Pulse: 70  Temp: (!) 97.3 F (36.3 C)  TempSrc: Temporal  SpO2: 96%  Weight: 214 lb (97.1 kg)  Height: 6' (1.829 m)   96% on RA BMI Readings from Last 3 Encounters:  12/15/19 29.02 kg/m  12/14/19 28.94 kg/m  11/25/19 28.48 kg/m   Wt Readings from Last 3 Encounters:  12/15/19 214 lb (97.1 kg)  12/14/19 213 lb 6.4 oz (96.8 kg)  11/25/19 210 lb (95.3 kg)     CBC    Component Value Date/Time  WBC 8.9 09/14/2019 0943   RBC 5.42 09/14/2019 0943   HGB 16.8 09/14/2019 0943   HCT 50.5 (H) 09/14/2019 0943   PLT 226 09/14/2019 0943   MCV 93.2 09/14/2019 0943   MCH 31.0 09/14/2019 0943   MCHC 33.3 09/14/2019 0943   RDW 16.4  (H) 09/14/2019 0943   LYMPHSABS 2.1 10/27/2018 1134   MONOABS 1.1 (H) 10/27/2018 1134   EOSABS 0.5 10/27/2018 1134   BASOSABS 0.0 10/27/2018 1134      Chest Imaging: 11/16/2019: CT chest right upper lobe lung nodule. The patient's images have been independently reviewed by me.    11/28/2019: PET nuclear imaging.  SUV max 10 right upper lobe lung nodule concerning for primary bronchogenic carcinoma. The patient's images have been independently reviewed by me.     Pulmonary Functions Testing Results: PFT Results Latest Ref Rng & Units 11/16/2018  FVC-Pre L 3.31  FVC-Predicted Pre % 74  FVC-Post L 3.29  FVC-Predicted Post % 74  Pre FEV1/FVC % % 71  Post FEV1/FCV % % 71  FEV1-Pre L 2.35  FEV1-Predicted Pre % 72  FEV1-Post L 2.33  DLCO UNC% % 76  DLCO COR %Predicted % 89  TLC L 6.03  TLC % Predicted % 85  RV % Predicted % 116    FeNO: none   Pathology: none   Echocardiogram: none   Heart Catheterization:   1. Left ventricular ejection fraction, by visual estimation, is 40 to  45%. The left ventricle has mild to moderately decreased function. There  is no left ventricular hypertrophy.  2. Mid and apical anterior septum, apical lateral segment, apical  anterior segment, and apex are hypokinetic.  3. Definity contrast agent was given IV to delineate the left ventricular  endocardial borders.  4. Elevated left atrial pressure.  5. Left ventricular diastolic parameters are consistent with Grade II  diastolic dysfunction (pseudonormalization).  6. The left ventricle demonstrates regional wall motion abnormalities.  7. Global right ventricle has moderately reduced systolic function.The  right ventricular size is normal. No increase in right ventricular wall  thickness.  8. Left atrial size was mildly dilated.  9. Right atrial size was mildly dilated.  10. The mitral valve is normal in structure. No evidence of mitral valve  regurgitation.  11. The tricuspid valve  is normal in structure. Tricuspid valve  regurgitation mild-moderate.  12. The aortic valve is normal in structure. Aortic valve regurgitation is  not visualized.  13. The pulmonic valve was grossly normal. Pulmonic valve regurgitation is  not visualized.  14. Mildly elevated pulmonary artery systolic pressure.  15. The inferior vena cava is dilated in size with >50% respiratory  variability, suggesting right atrial pressure of 8 mmHg.  16. No intracardiac thrombi or masses were visualized.     Assessment & Plan:     ICD-10-CM   1. Nodule of upper lobe of right lung  R91.1 Ambulatory referral to Pulmonology    CT Super D Chest Wo Contrast  2. Abnormal PET of right lung  R94.2 Ambulatory referral to Pulmonology    CT Super D Chest Wo Contrast  3. Tobacco abuse  Z72.0     Assessment:   Right upper lobe lung nodule concerning primary bronchogenic carcinoma Former smoker Atrial fibrillation on Xarelto  Plan Following Extensive Data Review & Interpretation:  . I reviewed prior external note(s) from surgical consultation 11/25/2019 Dr. Kipp Brood . I reviewed the result(s) of 11/18/2019 nuclear medicine pet imaging . I have ordered noncontrasted super D  imaging for CT, bronchoscopy navigational planning  Independent interpretation of tests . Review of patient's 11/16/2019 high-resolution CT imaging of chest images revealed right upper lobe lung nodule. The patient's images have been independently reviewed by me.    Discussion of management with Dr. Kipp Brood cardiothoracic surgery.  We will plan for electromagnetic navigational bronchoscopy with tissue sampling of the right upper lobe lung nodule.  Plan this on 12/27/2019 at [facility] endoscopy. Discussed risk benefits and alternatives in the office to include bleeding as well as pneumothorax. Patient is currently on Xarelto for atrial fibrillation and this will need to be held. He needs to full complete days not taking Xarelto prior to  the days of the procedure. He is also aware of these risk and had to stop anticoagulation for the past.  We appreciate the referral from cardiothoracic surgery    Current Outpatient Medications:  .  acetaminophen (TYLENOL) 500 MG tablet, Take 1,000 mg every 6 (six) hours as needed by mouth (for pain.)., Disp: , Rfl:  .  atorvastatin (LIPITOR) 10 MG tablet, TAKE 1 TABLET(10 MG) BY MOUTH DAILY, Disp: 90 tablet, Rfl: 2 .  budesonide-formoterol (SYMBICORT) 160-4.5 MCG/ACT inhaler, Inhale 2 puffs into the lungs 2 (two) times daily., Disp: 10.2 g, Rfl: 5 .  dronedarone (MULTAQ) 400 MG tablet, TAKE 1 TABLET BY MOUTH TWICE DAILY WITH MEAL, Disp: 180 tablet, Rfl: 1 .  empagliflozin (JARDIANCE) 10 MG TABS tablet, Take 10 mg by mouth daily. Schedule appt with PCP, Disp: 90 tablet, Rfl: 2 .  Ferrous Sulfate (SLOW FE) 142 (45 Fe) MG TBCR, Take 1 tablet by mouth 2 (two) times daily., Disp: , Rfl:  .  furosemide (LASIX) 40 MG tablet, TAKE 1 TABLET BY MOUTH AS NEEDED, Disp: 30 tablet, Rfl: 3 .  gabapentin (NEURONTIN) 300 MG capsule, Take 2 capsules (600 mg total) by mouth at bedtime., Disp: 180 capsule, Rfl: 2 .  lisinopril-hydrochlorothiazide (ZESTORETIC) 10-12.5 MG tablet, TAKE 1 TABLET BY MOUTH DAILY, Disp: 90 tablet, Rfl: 1 .  metoprolol succinate (TOPROL-XL) 100 MG 24 hr tablet, TAKE 1 TABLET(100 MG) BY MOUTH DAILY, Disp: 90 tablet, Rfl: 3 .  Multiple Vitamin (MULTIVITAMIN WITH MINERALS) TABS tablet, Take 1 tablet by mouth every other day. , Disp: , Rfl:  .  niacin (NIASPAN) 1000 MG CR tablet, TAKE 1 TABLET(1000 MG) BY MOUTH AT BEDTIME, Disp: 90 tablet, Rfl: 3 .  NON FORMULARY, CPAP at night, Disp: , Rfl:  .  traMADol (ULTRAM) 50 MG tablet, TAKE 1 TABLET BY MOUTH EVERY 12 HOURS AS NEEDED, Disp: 30 tablet, Rfl: 0 .  traZODone (DESYREL) 50 MG tablet, TAKE 1/2 TO 1 TABLET BY MOUTH AT BEDTIME AS NEEDED FOR SLEEP, Disp: 90 tablet, Rfl: 3 .  VENTOLIN HFA 108 (90 Base) MCG/ACT inhaler, INHALE 2 PUFFS INTO THE  LUNGS EVERY 6 HOURS AS NEEDED FOR WHEEZING OR SHORTNESS OF BREATH, Disp: 18 g, Rfl: 1 .  XARELTO 20 MG TABS tablet, TAKE 1 TABLET BY MOUTH EVERY DAY WITH SUPPER, Disp: 90 tablet, Rfl: 1   Garner Nash, DO [facility] 12/15/2019 11:03 AM

## 2019-12-15 NOTE — Patient Instructions (Signed)
Thank you for visiting Dr. Valeta Harms at [facility]. Today we recommend the following:  Orders Placed This Encounter  Procedures  . CT Super D Chest Wo Contrast  . Ambulatory referral to Pulmonology   Tentative Bronchoscopy to be scheduled 12/27/2019  Return in about 4 weeks (around 01/12/2020).    Please do your part to reduce the spread of COVID-19.

## 2019-12-15 NOTE — H&P (View-Only) (Signed)
Synopsis: Referred in Dr. Kipp Brood for lung nodule, PCP: By Luetta Nutting, DO, primary pulmonologist Dr. Kimber Relic.  Subjective:   PATIENT ID: [name] GENDER: male DOB: 09-23-1945, MRN: [id number]  Chief Complaint  Patient presents with  . Consult    Referred by Dr. Vaughan Browner for Lung Nodule. Patient reports that his breathing is doing well at this time.     This is a 74 year old gentleman history of a flutter, coronary artery disease, chronic diastolic heart failure, hypertension, hyperlipidemia, OSA.  Initially seen and evaluated by Dr. Kimber Relic undergoing high-resolution CT imaging of the chest.  He had follow-up imaging in 2021, November 16, 2019 which revealed a right upper lobe pulmonary nodule 8 x 11 mm concerning for primary bronchogenic carcinoma.  Patient underwent pet imaging on 11/28/2019 which revealed a right upper lobe nodule SUV max 5.0.  Patient was then referred to cardiothoracic surgery for evaluation of surgical resection.  Patient seen on 11/25/2019 by Dr. Kipp Brood.  Dr. Kipp Brood felt as if the nodule was too deep for surgical lung biopsy.  And that would be better to proceed with navigational bronchoscopy first for tissue diagnosis.    Past Medical History:  Diagnosis Date  . Atypical atrial flutter ([place])   . Coronary artery disease   . Diastolic dysfunction, left ventricle 05/2018   Dr. Rayann Heman- Cardiologist  . HLD (hyperlipidemia)   . HTN (hypertension)   . OSA (obstructive sleep apnea)    uses CPAP  . Peripheral artery disease ([place])    pseudoaneurysm post afib ablation at [facility] 2011, s/p bilateral iliac stents  . Persistent atrial fibrillation ([place])   . Pre-diabetes   . Renal artery stenosis ([facility])    right renal artery PTA and stenting  . S/P coronary artery bypass graft x 7 11/16/96     Family History  Problem Relation Age of Onset  . Cancer Mother   . Cancer Father   . Other Brother        NO MEDICAL PROBLEMS  . Other Son        NO MEDICAL PROBLEMS    . Leukemia Daughter 6       Recover and has no other problems     Past Surgical History:  Procedure Laterality Date  . 2-D echocardiogram  03/25/2010   Normal left ventricular function. Mild MR, TR, trivial AR  . ATRIAL FIBRILLATION ABLATION  03/27/2010, 12/31/2010   [facility], Dr. Nadeen Landau  . BACK SURGERY  2007  . cardiac stress test  11/21/2009   Exercise capacity 5 METS. No significant ischemia demonstrated  . CARDIOVERSION N/A 03/15/2015   Procedure: CARDIOVERSION;  Surgeon: Lorretta Harp, MD;  Location: [facility] ENDOSCOPY;  Service: Cardiovascular;  Laterality: N/A;  . CARDIOVERSION N/A 07/10/2017   Procedure: CARDIOVERSION;  Surgeon: Pixie Casino, MD;  Location: [facility] ENDOSCOPY;  Service: Cardiovascular;  Laterality: N/A;  . CARDIOVERSION N/A 03/23/2018   Procedure: CARDIOVERSION;  Surgeon: Sanda Klein, MD;  Location: [facility] ENDOSCOPY;  Service: Cardiovascular;  Laterality: N/A;  . CORONARY ARTERY BYPASS GRAFT  1998  . ORCHIECTOMY  1981   for cancer  . TOOTH EXTRACTION  05/27/13   tooth extraction with bone graft    Social History   Socioeconomic History  . Marital status: Divorced    Spouse name: Not on file  . Number of children: 2  . Years of education: 54  . Highest education level: Associate degree: academic program  Occupational History  . Occupation: retired    Comment: Community education officer  of [employer]  Tobacco Use  . Smoking status: Former Smoker    Packs/day: 1.50    Years: 30.00    Pack years: 45.00    Types: Cigarettes, Cigars    Quit date: 09/01/1994    Years since quitting: 25.3  . Smokeless tobacco: Never Used  . Tobacco comment: currently smokes a cigar occ-10/27/18  Substance and Sexual Activity  . Alcohol use: Yes    Alcohol/week: 1.0 - 2.0 standard drinks    Types: 1 - 2 Glasses of wine per week    Comment: occasional  . Drug use: No  . Sexual activity: Yes  Other Topics Concern  . Not on file  Social History Narrative   Pt lives in [place] alone.  Divorced.  2 cups of coffee in the morning.   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 Difficulty of Paying Living Expenses:   Food Insecurity:   . Worried About Charity fundraiser in the Last Year:   . Arboriculturist in the Last Year:   Transportation Needs:   . Film/video editor (Medical):   [name] Lack of Transportation (Non-Medical):   Physical Activity:   . Days of Exercise per Week:   . Minutes of Exercise per Session:   Stress:   . Feeling of Stress :   Social Connections:   . Frequency of Communication with Friends and Family:   . Frequency of Social Gatherings with Friends and Family:   . Attends Religious Services:   . Active Member of Clubs or Organizations:   . Attends Archivist Meetings:   [name] Marital Status:   Intimate Partner Violence:   . Fear of Current or Ex-Partner:   . Emotionally Abused:   [name] Physically Abused:   . Sexually Abused:      No Known Allergies   Outpatient Medications Prior to Visit  Medication Sig Dispense Refill  . acetaminophen (TYLENOL) 500 MG tablet Take 1,000 mg every 6 (six) hours as needed by mouth (for pain.).    [name] atorvastatin (LIPITOR) 10 MG tablet TAKE 1 TABLET(10 MG) BY MOUTH DAILY 90 tablet 2  . budesonide-formoterol (SYMBICORT) 160-4.5 MCG/ACT inhaler Inhale 2 puffs into the lungs 2 (two) times daily. 10.2 g 5  . dronedarone (MULTAQ) 400 MG tablet TAKE 1 TABLET BY MOUTH TWICE DAILY WITH MEAL 180 tablet 1  . empagliflozin (JARDIANCE) 10 MG TABS tablet Take 10 mg by mouth daily. Schedule appt with PCP 90 tablet 2  . Ferrous Sulfate (SLOW FE) 142 (45 Fe) MG TBCR Take 1 tablet by mouth 2 (two) times daily.    . furosemide (LASIX) 40 MG tablet TAKE 1 TABLET BY MOUTH AS NEEDED 30 tablet 3  . gabapentin (NEURONTIN) 300 MG capsule Take 2 capsules (600 mg total) by mouth at bedtime. 180 capsule 2  . lisinopril-hydrochlorothiazide (ZESTORETIC) 10-12.5 MG tablet TAKE 1  TABLET BY MOUTH DAILY 90 tablet 1  . metoprolol succinate (TOPROL-XL) 100 MG 24 hr tablet TAKE 1 TABLET(100 MG) BY MOUTH DAILY 90 tablet 3  . Multiple Vitamin (MULTIVITAMIN WITH MINERALS) TABS tablet Take 1 tablet by mouth every other day.     . niacin (NIASPAN) 1000 MG CR tablet TAKE 1 TABLET(1000 MG) BY MOUTH AT BEDTIME 90 tablet 3  . NON FORMULARY CPAP at night    . traMADol (ULTRAM) 50 MG tablet TAKE 1 TABLET BY MOUTH EVERY 12 HOURS  AS NEEDED 30 tablet 0  . traZODone (DESYREL) 50 MG tablet TAKE 1/2 TO 1 TABLET BY MOUTH AT BEDTIME AS NEEDED FOR SLEEP 90 tablet 3  . VENTOLIN HFA 108 (90 Base) MCG/ACT inhaler INHALE 2 PUFFS INTO THE LUNGS EVERY 6 HOURS AS NEEDED FOR WHEEZING OR SHORTNESS OF BREATH 18 g 1  . XARELTO 20 MG TABS tablet TAKE 1 TABLET BY MOUTH EVERY DAY WITH SUPPER 90 tablet 1   No facility-administered medications prior to visit.    Review of Systems  Constitutional: Negative for chills, fever, malaise/fatigue and weight loss.  HENT: Negative for hearing loss, sore throat and tinnitus.   Eyes: Negative for blurred vision and double vision.  Respiratory: Negative for cough, hemoptysis, sputum production, shortness of breath, wheezing and stridor.   Cardiovascular: Negative for chest pain, palpitations, orthopnea, leg swelling and PND.  Gastrointestinal: Negative for abdominal pain, constipation, diarrhea, heartburn, nausea and vomiting.  Genitourinary: Negative for dysuria, hematuria and urgency.  Musculoskeletal: Negative for joint pain and myalgias.  Skin: Negative for itching and rash.  Neurological: Negative for dizziness, tingling, weakness and headaches.  Endo/Heme/Allergies: Negative for environmental allergies. Does not bruise/bleed easily.  Psychiatric/Behavioral: Negative for depression. The patient is not nervous/anxious and does not have insomnia.   All other systems reviewed and are negative.    Objective:  Physical Exam Vitals reviewed.  Constitutional:       General: He is not in acute distress.    Appearance: He is well-developed.  HENT:     Head: Normocephalic and atraumatic.  Eyes:     General: No scleral icterus.    Conjunctiva/sclera: Conjunctivae normal.     Pupils: Pupils are equal, round, and reactive to light.  Neck:     Vascular: No JVD.     Trachea: No tracheal deviation.  Cardiovascular:     Rate and Rhythm: Normal rate and regular rhythm.     Heart sounds: Normal heart sounds. No murmur.  Pulmonary:     Effort: Pulmonary effort is normal. No tachypnea, accessory muscle usage or respiratory distress.     Breath sounds: Normal breath sounds. No stridor. No wheezing, rhonchi or rales.  Abdominal:     General: Bowel sounds are normal. There is no distension.     Palpations: Abdomen is soft.     Tenderness: There is no abdominal tenderness.  Musculoskeletal:        General: No tenderness.     Cervical back: Neck supple.  Lymphadenopathy:     Cervical: No cervical adenopathy.  Skin:    General: Skin is warm and dry.     Capillary Refill: Capillary refill takes less than 2 seconds.     Findings: No rash.  Neurological:     Mental Status: He is alert and oriented to person, place, and time.  Psychiatric:        Behavior: Behavior normal.      Vitals:   12/15/19 1031  BP: 116/66  Pulse: 70  Temp: (!) 97.3 F (36.3 C)  TempSrc: Temporal  SpO2: 96%  Weight: 214 lb (97.1 kg)  Height: 6' (1.829 m)   96% on RA BMI Readings from Last 3 Encounters:  12/15/19 29.02 kg/m  12/14/19 28.94 kg/m  11/25/19 28.48 kg/m   Wt Readings from Last 3 Encounters:  12/15/19 214 lb (97.1 kg)  12/14/19 213 lb 6.4 oz (96.8 kg)  11/25/19 210 lb (95.3 kg)     CBC    Component Value Date/Time  WBC 8.9 09/14/2019 0943   RBC 5.42 09/14/2019 0943   HGB 16.8 09/14/2019 0943   HCT 50.5 (H) 09/14/2019 0943   PLT 226 09/14/2019 0943   MCV 93.2 09/14/2019 0943   MCH 31.0 09/14/2019 0943   MCHC 33.3 09/14/2019 0943   RDW 16.4  (H) 09/14/2019 0943   LYMPHSABS 2.1 10/27/2018 1134   MONOABS 1.1 (H) 10/27/2018 1134   EOSABS 0.5 10/27/2018 1134   BASOSABS 0.0 10/27/2018 1134      Chest Imaging: 11/16/2019: CT chest right upper lobe lung nodule. The patient's images have been independently reviewed by me.    11/28/2019: PET nuclear imaging.  SUV max 10 right upper lobe lung nodule concerning for primary bronchogenic carcinoma. The patient's images have been independently reviewed by me.     Pulmonary Functions Testing Results: PFT Results Latest Ref Rng & Units 11/16/2018  FVC-Pre L 3.31  FVC-Predicted Pre % 74  FVC-Post L 3.29  FVC-Predicted Post % 74  Pre FEV1/FVC % % 71  Post FEV1/FCV % % 71  FEV1-Pre L 2.35  FEV1-Predicted Pre % 72  FEV1-Post L 2.33  DLCO UNC% % 76  DLCO COR %Predicted % 89  TLC L 6.03  TLC % Predicted % 85  RV % Predicted % 116    FeNO: none   Pathology: none   Echocardiogram: none   Heart Catheterization:   1. Left ventricular ejection fraction, by visual estimation, is 40 to  45%. The left ventricle has mild to moderately decreased function. There  is no left ventricular hypertrophy.  2. Mid and apical anterior septum, apical lateral segment, apical  anterior segment, and apex are hypokinetic.  3. Definity contrast agent was given IV to delineate the left ventricular  endocardial borders.  4. Elevated left atrial pressure.  5. Left ventricular diastolic parameters are consistent with Grade II  diastolic dysfunction (pseudonormalization).  6. The left ventricle demonstrates regional wall motion abnormalities.  7. Global right ventricle has moderately reduced systolic function.The  right ventricular size is normal. No increase in right ventricular wall  thickness.  8. Left atrial size was mildly dilated.  9. Right atrial size was mildly dilated.  10. The mitral valve is normal in structure. No evidence of mitral valve  regurgitation.  11. The tricuspid valve  is normal in structure. Tricuspid valve  regurgitation mild-moderate.  12. The aortic valve is normal in structure. Aortic valve regurgitation is  not visualized.  13. The pulmonic valve was grossly normal. Pulmonic valve regurgitation is  not visualized.  14. Mildly elevated pulmonary artery systolic pressure.  15. The inferior vena cava is dilated in size with >50% respiratory  variability, suggesting right atrial pressure of 8 mmHg.  16. No intracardiac thrombi or masses were visualized.     Assessment & Plan:     ICD-10-CM   1. Nodule of upper lobe of right lung  R91.1 Ambulatory referral to Pulmonology    CT Super D Chest Wo Contrast  2. Abnormal PET of right lung  R94.2 Ambulatory referral to Pulmonology    CT Super D Chest Wo Contrast  3. Tobacco abuse  Z72.0     Assessment:   Right upper lobe lung nodule concerning primary bronchogenic carcinoma Former smoker Atrial fibrillation on Xarelto  Plan Following Extensive Data Review & Interpretation:  . I reviewed prior external note(s) from surgical consultation 11/25/2019 Dr. Kipp Brood . I reviewed the result(s) of 11/18/2019 nuclear medicine pet imaging . I have ordered noncontrasted super D  imaging for CT, bronchoscopy navigational planning  Independent interpretation of tests . Review of patient's 11/16/2019 high-resolution CT imaging of chest images revealed right upper lobe lung nodule. The patient's images have been independently reviewed by me.    Discussion of management with Dr. Kipp Brood cardiothoracic surgery.  We will plan for electromagnetic navigational bronchoscopy with tissue sampling of the right upper lobe lung nodule.  Plan this on 12/27/2019 at [facility] endoscopy. Discussed risk benefits and alternatives in the office to include bleeding as well as pneumothorax. Patient is currently on Xarelto for atrial fibrillation and this will need to be held. He needs to full complete days not taking Xarelto prior to  the days of the procedure. He is also aware of these risk and had to stop anticoagulation for the past.  We appreciate the referral from cardiothoracic surgery    Current Outpatient Medications:  .  acetaminophen (TYLENOL) 500 MG tablet, Take 1,000 mg every 6 (six) hours as needed by mouth (for pain.)., Disp: , Rfl:  .  atorvastatin (LIPITOR) 10 MG tablet, TAKE 1 TABLET(10 MG) BY MOUTH DAILY, Disp: 90 tablet, Rfl: 2 .  budesonide-formoterol (SYMBICORT) 160-4.5 MCG/ACT inhaler, Inhale 2 puffs into the lungs 2 (two) times daily., Disp: 10.2 g, Rfl: 5 .  dronedarone (MULTAQ) 400 MG tablet, TAKE 1 TABLET BY MOUTH TWICE DAILY WITH MEAL, Disp: 180 tablet, Rfl: 1 .  empagliflozin (JARDIANCE) 10 MG TABS tablet, Take 10 mg by mouth daily. Schedule appt with PCP, Disp: 90 tablet, Rfl: 2 .  Ferrous Sulfate (SLOW FE) 142 (45 Fe) MG TBCR, Take 1 tablet by mouth 2 (two) times daily., Disp: , Rfl:  .  furosemide (LASIX) 40 MG tablet, TAKE 1 TABLET BY MOUTH AS NEEDED, Disp: 30 tablet, Rfl: 3 .  gabapentin (NEURONTIN) 300 MG capsule, Take 2 capsules (600 mg total) by mouth at bedtime., Disp: 180 capsule, Rfl: 2 .  lisinopril-hydrochlorothiazide (ZESTORETIC) 10-12.5 MG tablet, TAKE 1 TABLET BY MOUTH DAILY, Disp: 90 tablet, Rfl: 1 .  metoprolol succinate (TOPROL-XL) 100 MG 24 hr tablet, TAKE 1 TABLET(100 MG) BY MOUTH DAILY, Disp: 90 tablet, Rfl: 3 .  Multiple Vitamin (MULTIVITAMIN WITH MINERALS) TABS tablet, Take 1 tablet by mouth every other day. , Disp: , Rfl:  .  niacin (NIASPAN) 1000 MG CR tablet, TAKE 1 TABLET(1000 MG) BY MOUTH AT BEDTIME, Disp: 90 tablet, Rfl: 3 .  NON FORMULARY, CPAP at night, Disp: , Rfl:  .  traMADol (ULTRAM) 50 MG tablet, TAKE 1 TABLET BY MOUTH EVERY 12 HOURS AS NEEDED, Disp: 30 tablet, Rfl: 0 .  traZODone (DESYREL) 50 MG tablet, TAKE 1/2 TO 1 TABLET BY MOUTH AT BEDTIME AS NEEDED FOR SLEEP, Disp: 90 tablet, Rfl: 3 .  VENTOLIN HFA 108 (90 Base) MCG/ACT inhaler, INHALE 2 PUFFS INTO THE  LUNGS EVERY 6 HOURS AS NEEDED FOR WHEEZING OR SHORTNESS OF BREATH, Disp: 18 g, Rfl: 1 .  XARELTO 20 MG TABS tablet, TAKE 1 TABLET BY MOUTH EVERY DAY WITH SUPPER, Disp: 90 tablet, Rfl: 1   Garner Nash, DO [facility] 12/15/2019 11:03 AM

## 2019-12-19 ENCOUNTER — Other Ambulatory Visit: Payer: Self-pay

## 2019-12-19 ENCOUNTER — Ambulatory Visit (INDEPENDENT_AMBULATORY_CARE_PROVIDER_SITE_OTHER)
Admission: RE | Admit: 2019-12-19 | Discharge: 2019-12-19 | Disposition: A | Discharge status: Home / Self Care | Payer: Medicare Other | Source: Ambulatory Visit | Attending: Pulmonary Disease | Admitting: Pulmonary Disease

## 2019-12-19 DIAGNOSIS — R942 Abnormal results of pulmonary function studies: Secondary | ICD-10-CM

## 2019-12-19 DIAGNOSIS — R911 Solitary pulmonary nodule: Secondary | ICD-10-CM

## 2019-12-21 ENCOUNTER — Telehealth: Payer: Self-pay | Admitting: Pulmonary Disease

## 2019-12-21 DIAGNOSIS — G4733 Obstructive sleep apnea (adult) (pediatric): Secondary | ICD-10-CM | POA: Diagnosis not present

## 2019-12-21 NOTE — Telephone Encounter (Signed)
Pt is scheduled for a bronch Tuesday 4/27. Due to a cancellation, pt's bronch has been moved up to 7:30am. Called and spoke with pt letting him know this information. Pt also requested that I send him a mychart message with this info as well. Message sent to pt. Nothing further needed.

## 2019-12-24 ENCOUNTER — Other Ambulatory Visit (HOSPITAL_COMMUNITY)
Admission: RE | Admit: 2019-12-24 | Discharge: 2019-12-24 | Disposition: A | Discharge status: Home / Self Care | Payer: Medicare Other | Source: Ambulatory Visit | Attending: Pulmonary Disease | Admitting: Pulmonary Disease

## 2019-12-24 DIAGNOSIS — Z01812 Encounter for preprocedural laboratory examination: Secondary | ICD-10-CM | POA: Insufficient documentation

## 2019-12-24 DIAGNOSIS — Z20822 Contact with and (suspected) exposure to covid-19: Secondary | ICD-10-CM | POA: Diagnosis not present

## 2019-12-24 LAB — SARS CORONAVIRUS 2 (TAT 6-24 HRS): SARS Coronavirus 2: NEGATIVE

## 2019-12-26 ENCOUNTER — Other Ambulatory Visit: Payer: Self-pay

## 2019-12-26 ENCOUNTER — Encounter (HOSPITAL_COMMUNITY): Payer: Self-pay | Admitting: Pulmonary Disease

## 2019-12-26 NOTE — Progress Notes (Signed)
Anesthesia Chart Review:  Follows with cardiology for hx of PAD (s/p bilateral iliac stenting in 2007 with right renal artery stenting), CAD (s/p CABG x 7 1998, Myoview 2014 showed inferolateral scar without ischemia), OSA on CPAP, paroxysmal afib s/p ablation and multiple cardioversion. Last seen by Dr. Gwenlyn Found 07/13/19. Discussed at that time that he continued to have some DOE following a prolonged URI in Dec 2019. Dr. Gwenlyn Found ordered repeat echo which, per his comment, showed "No change in EF or degree of diastolic dysfunction since last 2D a year ago. EF remains mild-moderately reduced at 40-45%." He was recently seen in [facility] [facility] 12/14/19 and noted to be maintaining SR, he is on Multaq, Toprol, and Xarelto for this. Overall doing well, recommended 15yr followup.   Pt is followed by pulmonology for COPD and right upper lobe pulmonary nodule that shown increased growth on serial CT scans. Per Dr. Matilde Bash notes, pt has been reporting relatively stable DOE for the past 10 years.   Per Dr. Juline Patch note, pt instructed to hold Xarelto 48hr.   Will need DOS labs and eval.   EKG 12/14/19: Sinus rhythm with Premature atrial complexes. Rate 69. Right bundle branch block  CT Chest 12/19/19: IMPRESSION: 1. Slight interval enlargement of a previously PET avid nodule of the posterior apical segment right upper lobe, measuring 1.2 cm, which remains highly concerning for malignancy.  2.  Coronary artery disease.  Aortic Atherosclerosis (ICD10-I70.0).  PFTs 12/15/19: FVC-%Pred-Pre Latest Units: % 75  FEV1-%Pred-Pre Latest Units: % 71  FEV1FVC-%Pred-Pre Latest Units: % 94  TLC % pred Latest Units: % 88  DLCO unc % pred Latest Units: % 71    TTE 07/27/19: 1. Left ventricular ejection fraction, by visual estimation, is 40 to  45%. The left ventricle has mild to moderately decreased function. There  is no left ventricular hypertrophy.  2. Mid and apical anterior septum, apical lateral segment, apical   anterior segment, and apex are hypokinetic.  3. Definity contrast agent was given IV to delineate the left ventricular  endocardial borders.  4. Elevated left atrial pressure.  5. Left ventricular diastolic parameters are consistent with Grade II  diastolic dysfunction (pseudonormalization).  6. The left ventricle demonstrates regional wall motion abnormalities.  7. Global right ventricle has moderately reduced systolic function.The  right ventricular size is normal. No increase in right ventricular wall  thickness.  8. Left atrial size was mildly dilated.  9. Right atrial size was mildly dilated.  10. The mitral valve is normal in structure. No evidence of mitral valve  regurgitation.  11. The tricuspid valve is normal in structure. Tricuspid valve  regurgitation mild-moderate.  12. The aortic valve is normal in structure. Aortic valve regurgitation is  not visualized.  13. The pulmonic valve was grossly normal. Pulmonic valve regurgitation is  not visualized.  14. Mildly elevated pulmonary artery systolic pressure.  15. The inferior vena cava is dilated in size with >50% respiratory  variability, suggesting right atrial pressure of 8 mmHg.  16. No intracardiac thrombi or masses were visualized.    Wynonia Musty [facility] Short Stay Center/Anesthesiology Phone [phone] 12/26/2019 10:24 AM

## 2019-12-26 NOTE — Progress Notes (Signed)
Mr [name] denies chest pain or shortness of breath. Patient tested negative for Covid on 12/24/18/21 and has been in quarantine since that time.  Mr [name] is a type II diabetic, he checks CBG occassionally, "it runs around 130."  I instructed patient to check CBG after awaking and every 2 hours until arrival  to the hospital.  I Instructed patient if CBG is less than 70  To drink 1/2 cup of a clear juice. Recheck in 15 minutes.

## 2019-12-26 NOTE — Anesthesia Preprocedure Evaluation (Addendum)
Anesthesia Evaluation  Patient identified by MRN, date of birth, ID band Patient awake    Reviewed: Allergy & Precautions  Airway Mallampati: II  TM Distance: >3 FB     Dental   Pulmonary former smoker,    breath sounds clear to auscultation       Cardiovascular hypertension, + CAD, + Past MI, + Peripheral Vascular Disease and +CHF  + dysrhythmias  Rhythm:Regular Rate:Normal     Neuro/Psych    GI/Hepatic negative GI ROS, Neg liver ROS,   Endo/Other  diabetes  Renal/GU Renal diseasenegative Renal ROS     Musculoskeletal   Abdominal   Peds  Hematology   Anesthesia Other Findings   Reproductive/Obstetrics                           Anesthesia Physical Anesthesia Plan  ASA: III  Anesthesia Plan: General   Post-op Pain Management:    Induction: Intravenous  PONV Risk Score and Plan: 2 and Ondansetron, Dexamethasone and Midazolam  Airway Management Planned: Oral ETT  Additional Equipment:   Intra-op Plan:   Post-operative Plan:   Informed Consent: I have reviewed the patients History and Physical, chart, labs and discussed the procedure including the risks, benefits and alternatives for the proposed anesthesia with the patient or authorized representative who has indicated his/her understanding and acceptance.     Dental advisory given  Plan Discussed with: Anesthesiologist and CRNA  Anesthesia Plan Comments: (Follows with cardiology for hx of PAD (s/p bilateral iliac stenting in 2007 with right renal artery stenting), CAD (s/p CABG x 7 1998, Myoview 2014 showed inferolateral scar without ischemia), OSA on CPAP, paroxysmal afib s/p ablation and multiple cardioversion. Last seen by Dr. Gwenlyn Found 07/13/19. Discussed at that time that he continued to have some DOE following a prolonged URI in Dec 2019. Dr. Gwenlyn Found ordered repeat echo which, per his comment, showed "No change in EF or degree of  diastolic dysfunction since last 2D a year ago. EF remains mild-moderately reduced at 40-45%." He was recently seen in [facility] 12/14/19 and noted to be maintaining SR, he is on Multaq, Toprol, and Xarelto for this. Overall doing well, recommended 27yr followup.   Pt is followed by pulmonology for COPD and right upper lobe pulmonary nodule that shown increased growth on serial CT scans. Per Dr. Matilde Bash notes, pt has been reporting relatively stable DOE for the past 10 years.   Per Dr. Juline Patch note, pt instructed to hold Xarelto 48hr.   Will need DOS labs and eval.   EKG 12/14/19: Sinus rhythm with Premature atrial complexes. Rate 69. Right bundle branch block  CT Chest 12/19/19: IMPRESSION: 1. Slight interval enlargement of a previously PET avid nodule of the posterior apical segment right upper lobe, measuring 1.2 cm, which remains highly concerning for malignancy.  2.  Coronary artery disease.  Aortic Atherosclerosis (ICD10-I70.0).  PFTs 12/15/19: FVC-%Pred-Pre Latest Units: % 75 FEV1-%Pred-Pre Latest Units: % 71 FEV1FVC-%Pred-Pre Latest Units: % 94 TLC % pred Latest Units: % 88 DLCO unc % pred Latest Units: % 71   TTE 07/27/19: 1. Left ventricular ejection fraction, by visual estimation, is 40 to  45%. The left ventricle has mild to moderately decreased function. There  is no left ventricular hypertrophy.  2. Mid and apical anterior septum, apical lateral segment, apical  anterior segment, and apex are hypokinetic.  3. Definity contrast agent was given IV to delineate the left ventricular  endocardial borders.  4.  Elevated left atrial pressure.  5. Left ventricular diastolic parameters are consistent with Grade II  diastolic dysfunction (pseudonormalization).  6. The left ventricle demonstrates regional wall motion abnormalities.  7. Global right ventricle has moderately reduced systolic function.The  right ventricular size is normal. No increase in right ventricular  wall  thickness.  8. Left atrial size was mildly dilated.  9. Right atrial size was mildly dilated.  10. The mitral valve is normal in structure. No evidence of mitral valve  regurgitation.  11. The tricuspid valve is normal in structure. Tricuspid valve  regurgitation mild-moderate.  12. The aortic valve is normal in structure. Aortic valve regurgitation is  not visualized.  13. The pulmonic valve was grossly normal. Pulmonic valve regurgitation is  not visualized.  14. Mildly elevated pulmonary artery systolic pressure.  15. The inferior vena cava is dilated in size with >50% respiratory  variability, suggesting right atrial pressure of 8 mmHg.  16. No intracardiac thrombi or masses were visualized. )      Anesthesia Quick Evaluation

## 2019-12-27 ENCOUNTER — Encounter (HOSPITAL_COMMUNITY)
Admission: RE | Disposition: A | Discharge status: Home / Self Care | Payer: Self-pay | Source: Home / Self Care | Attending: Pulmonary Disease

## 2019-12-27 ENCOUNTER — Other Ambulatory Visit: Payer: Self-pay

## 2019-12-27 ENCOUNTER — Encounter (HOSPITAL_COMMUNITY): Payer: Self-pay | Admitting: Pulmonary Disease

## 2019-12-27 ENCOUNTER — Ambulatory Visit (HOSPITAL_COMMUNITY): Payer: Medicare Other | Admitting: Physician Assistant

## 2019-12-27 ENCOUNTER — Ambulatory Visit (HOSPITAL_COMMUNITY): Payer: Medicare Other

## 2019-12-27 ENCOUNTER — Ambulatory Visit (HOSPITAL_COMMUNITY)
Admission: RE | Admit: 2019-12-27 | Discharge: 2019-12-27 | Disposition: A | Discharge status: Home / Self Care | Payer: Medicare Other | Attending: Pulmonary Disease | Admitting: Pulmonary Disease

## 2019-12-27 DIAGNOSIS — R911 Solitary pulmonary nodule: Secondary | ICD-10-CM | POA: Diagnosis not present

## 2019-12-27 DIAGNOSIS — G4733 Obstructive sleep apnea (adult) (pediatric): Secondary | ICD-10-CM | POA: Diagnosis not present

## 2019-12-27 DIAGNOSIS — Z951 Presence of aortocoronary bypass graft: Secondary | ICD-10-CM | POA: Insufficient documentation

## 2019-12-27 DIAGNOSIS — I252 Old myocardial infarction: Secondary | ICD-10-CM | POA: Diagnosis not present

## 2019-12-27 DIAGNOSIS — E1151 Type 2 diabetes mellitus with diabetic peripheral angiopathy without gangrene: Secondary | ICD-10-CM | POA: Diagnosis not present

## 2019-12-27 DIAGNOSIS — I110 Hypertensive heart disease with heart failure: Secondary | ICD-10-CM | POA: Insufficient documentation

## 2019-12-27 DIAGNOSIS — I2510 Atherosclerotic heart disease of native coronary artery without angina pectoris: Secondary | ICD-10-CM | POA: Diagnosis not present

## 2019-12-27 DIAGNOSIS — Z9889 Other specified postprocedural states: Secondary | ICD-10-CM

## 2019-12-27 DIAGNOSIS — I4892 Unspecified atrial flutter: Secondary | ICD-10-CM | POA: Diagnosis not present

## 2019-12-27 DIAGNOSIS — I700 Atherosclerosis of aorta: Secondary | ICD-10-CM | POA: Insufficient documentation

## 2019-12-27 DIAGNOSIS — I071 Rheumatic tricuspid insufficiency: Secondary | ICD-10-CM | POA: Insufficient documentation

## 2019-12-27 DIAGNOSIS — Z87891 Personal history of nicotine dependence: Secondary | ICD-10-CM | POA: Diagnosis not present

## 2019-12-27 DIAGNOSIS — I5032 Chronic diastolic (congestive) heart failure: Secondary | ICD-10-CM | POA: Insufficient documentation

## 2019-12-27 DIAGNOSIS — Z809 Family history of malignant neoplasm, unspecified: Secondary | ICD-10-CM | POA: Insufficient documentation

## 2019-12-27 DIAGNOSIS — C3411 Malignant neoplasm of upper lobe, right bronchus or lung: Secondary | ICD-10-CM | POA: Insufficient documentation

## 2019-12-27 DIAGNOSIS — I272 Pulmonary hypertension, unspecified: Secondary | ICD-10-CM | POA: Diagnosis not present

## 2019-12-27 DIAGNOSIS — R918 Other nonspecific abnormal finding of lung field: Secondary | ICD-10-CM | POA: Diagnosis not present

## 2019-12-27 DIAGNOSIS — Z806 Family history of leukemia: Secondary | ICD-10-CM | POA: Insufficient documentation

## 2019-12-27 DIAGNOSIS — E785 Hyperlipidemia, unspecified: Secondary | ICD-10-CM | POA: Diagnosis not present

## 2019-12-27 DIAGNOSIS — I480 Paroxysmal atrial fibrillation: Secondary | ICD-10-CM | POA: Diagnosis not present

## 2019-12-27 HISTORY — PX: BRONCHIAL NEEDLE ASPIRATION BIOPSY: SHX5106

## 2019-12-27 HISTORY — PX: FIDUCIAL MARKER PLACEMENT: SHX6858

## 2019-12-27 HISTORY — DX: Cardiac arrhythmia, unspecified: I49.9

## 2019-12-27 HISTORY — PX: BRONCHIAL BIOPSY: SHX5109

## 2019-12-27 HISTORY — DX: Malignant (primary) neoplasm, unspecified: C80.1

## 2019-12-27 HISTORY — PX: ELECTROMAGNETIC NAVIGATION BROCHOSCOPY: SHX5369

## 2019-12-27 HISTORY — DX: Acute myocardial infarction, unspecified: I21.9

## 2019-12-27 HISTORY — PX: BRONCHIAL BRUSHINGS: SHX5108

## 2019-12-27 HISTORY — PX: VIDEO BRONCHOSCOPY WITH ENDOBRONCHIAL NAVIGATION: SHX6175

## 2019-12-27 HISTORY — DX: Chronic obstructive pulmonary disease, unspecified: J44.9

## 2019-12-27 HISTORY — DX: Personal history of urinary calculi: Z87.442

## 2019-12-27 LAB — CBC
HCT: 49.1 % (ref 39.0–52.0)
Hemoglobin: 16.2 g/dL (ref 13.0–17.0)
MCH: 33.3 pg (ref 26.0–34.0)
MCHC: 33 g/dL (ref 30.0–36.0)
MCV: 101 fL — ABNORMAL HIGH (ref 80.0–100.0)
Platelets: 157 10*3/uL (ref 150–400)
RBC: 4.86 MIL/uL (ref 4.22–5.81)
RDW: 14.4 % (ref 11.5–15.5)
WBC: 6.2 10*3/uL (ref 4.0–10.5)
nRBC: 0 % (ref 0.0–0.2)

## 2019-12-27 LAB — COMPREHENSIVE METABOLIC PANEL
ALT: 27 U/L (ref 0–44)
AST: 33 U/L (ref 15–41)
Albumin: 3.7 g/dL (ref 3.5–5.0)
Alkaline Phosphatase: 71 U/L (ref 38–126)
Anion gap: 9 (ref 5–15)
BUN: 18 mg/dL (ref 8–23)
CO2: 28 mmol/L (ref 22–32)
Calcium: 9.2 mg/dL (ref 8.9–10.3)
Chloride: 103 mmol/L (ref 98–111)
Creatinine, Ser: 1.36 mg/dL — ABNORMAL HIGH (ref 0.61–1.24)
GFR calc Af Amer: >60 mL/min (ref >60)
GFR calc non Af Amer: 52 mL/min — ABNORMAL LOW (ref >60)
Glucose, Bld: 131 mg/dL — ABNORMAL HIGH (ref 70–99)
Potassium: 3.8 mmol/L (ref 3.5–5.1)
Sodium: 140 mmol/L (ref 135–145)
Total Bilirubin: 1.2 mg/dL (ref 0.3–1.2)
Total Protein: 6.4 g/dL — ABNORMAL LOW (ref 6.5–8.1)

## 2019-12-27 LAB — APTT: aPTT: 30 seconds (ref 24–36)

## 2019-12-27 LAB — GLUCOSE, CAPILLARY: Glucose-Capillary: 127 mg/dL — ABNORMAL HIGH (ref 70–99)

## 2019-12-27 LAB — PROTIME-INR
INR: 1.1 (ref 0.8–1.2)
Prothrombin Time: 13.9 seconds (ref 11.4–15.2)

## 2019-12-27 SURGERY — VIDEO BRONCHOSCOPY WITH ENDOBRONCHIAL NAVIGATION
Anesthesia: General

## 2019-12-27 MED ORDER — EPHEDRINE SULFATE-NACL 50-0.9 MG/10ML-% IV SOSY
PREFILLED_SYRINGE | INTRAVENOUS | Status: DC | PRN
  Administered 2019-12-27: 10 mg via INTRAVENOUS

## 2019-12-27 MED ORDER — SUGAMMADEX SODIUM 200 MG/2ML IV SOLN
INTRAVENOUS | Status: DC | PRN
  Administered 2019-12-27: 200 mg via INTRAVENOUS

## 2019-12-27 MED ORDER — ONDANSETRON HCL 4 MG/2ML IJ SOLN
INTRAMUSCULAR | Status: DC | PRN
  Administered 2019-12-27: 4 mg via INTRAVENOUS

## 2019-12-27 MED ORDER — FENTANYL CITRATE (PF) 100 MCG/2ML IJ SOLN
INTRAMUSCULAR | Status: DC | PRN
  Administered 2019-12-27 (×2): 50 ug via INTRAVENOUS

## 2019-12-27 MED ORDER — DEXAMETHASONE SODIUM PHOSPHATE 10 MG/ML IJ SOLN
INTRAMUSCULAR | Status: DC | PRN
  Administered 2019-12-27: 10 mg via INTRAVENOUS

## 2019-12-27 MED ORDER — LACTATED RINGERS IV SOLN: INTRAVENOUS | Status: DC | PRN

## 2019-12-27 MED ORDER — FENTANYL CITRATE (PF) 100 MCG/2ML IJ SOLN: 25.0000 ug | INTRAMUSCULAR | Status: DC | PRN

## 2019-12-27 MED ORDER — PROPOFOL 10 MG/ML IV BOLUS
INTRAVENOUS | Status: DC | PRN
  Administered 2019-12-27: 150 mg via INTRAVENOUS

## 2019-12-27 MED ORDER — LIDOCAINE 2% (20 MG/ML) 5 ML SYRINGE
INTRAMUSCULAR | Status: DC | PRN
  Administered 2019-12-27: 80 mg via INTRAVENOUS

## 2019-12-27 MED ORDER — SUCCINYLCHOLINE CHLORIDE 200 MG/10ML IV SOSY
PREFILLED_SYRINGE | INTRAVENOUS | Status: DC | PRN
  Administered 2019-12-27: 140 mg via INTRAVENOUS

## 2019-12-27 MED ORDER — ROCURONIUM BROMIDE 50 MG/5ML IV SOSY
PREFILLED_SYRINGE | INTRAVENOUS | Status: DC | PRN
  Administered 2019-12-27: 70 mg via INTRAVENOUS

## 2019-12-27 SURGICAL SUPPLY — 47 items

## 2019-12-27 NOTE — Discharge Instructions (Signed)
Flexible Bronchoscopy, Care After This sheet gives you information about how to care for yourself after your test. Your doctor may also give you more specific instructions. If you have problems or questions, contact your doctor. Follow these instructions at home: Eating and drinking  The day after the test, go back to your normal diet. Driving  Do not drive for 24 hours if you were given a medicine to help you relax (sedative).  Do not drive or use heavy machinery while taking prescription pain medicine. General instructions   Take over-the-counter and prescription medicines only as told by your doctor.  Return to your normal activities as told. Ask what activities are safe for you.  Do not use any products that have nicotine or tobacco in them. This includes cigarettes and e-cigarettes. If you need help quitting, ask your doctor.  Keep all follow-up visits as told by your doctor. This is important. It is very important if you had a tissue sample (biopsy) taken. Get help right away if:  You have shortness of breath that gets worse.  You get light-headed.  You feel like you are going to pass out (faint).  You have chest pain.  You cough up: ? More than a little blood. ? More blood than before. Summary  Do not eat or drink anything (not even water) for 2 hours after your test, or until your numbing medicine wears off.  Do not use cigarettes. Do not use e-cigarettes.  Get help right away if you have chest pain. This information is not intended to replace advice given to you by your health care provider. Make sure you discuss any questions you have with your health care provider. Document Revised: 07/31/2017 Document Reviewed: 09/05/2016 Elsevier Patient Education  2020 [employer].

## 2019-12-27 NOTE — Interval H&P Note (Signed)
History and Physical Interval Note:  12/27/2019 7:06 AM  [name]  has presented today for surgery, with the diagnosis of NODULE OF UPPER LOBE OF RIGHT LUNG.  The various methods of treatment have been discussed with the patient and family. After consideration of risks, benefits and other options for treatment, the patient has consented to  Procedure(s): [place] (N/A) as a surgical intervention.  The patient's history has been reviewed, patient examined, no change in status, stable for surgery.  I have reviewed the patient's chart and labs.  Questions were answered to the patient's satisfaction.    Patient in pre-op. All questions answered. No barriers to proceed. Discussed risks of bleeding, pneumothorax. All discussed planned fiducial placement.   Ben [place]

## 2019-12-27 NOTE — Anesthesia Procedure Notes (Addendum)
Procedure Name: Intubation Date/Time: 12/27/2019 7:39 AM Performed by: Lance Coon, CRNA Pre-anesthesia Checklist: Patient identified, Emergency Drugs available, Patient being monitored, Suction available and Timeout performed Patient Re-evaluated:Patient Re-evaluated prior to induction Oxygen Delivery Method: Circle system utilized Preoxygenation: Pre-oxygenation with 100% oxygen Induction Type: IV induction Ventilation: Mask ventilation without difficulty Laryngoscope Size: Miller and 2 Grade View: Grade I Tube type: Oral Tube size: 8.5 mm Number of attempts: 1 Airway Equipment and Method: Stylet Placement Confirmation: ETT inserted through vocal cords under direct vision,  positive ETCO2 and breath sounds checked- equal and bilateral Secured at: 23 cm Tube secured with: Tape Dental Injury: Teeth and Oropharynx as per pre-operative assessment

## 2019-12-27 NOTE — Op Note (Addendum)
Video Bronchoscopy with Electromagnetic Navigation with peripheral targeting over fluoroscopic wire placement and catheter exchange followed by peripheral fiducial marking procedure Note  Date of Operation: 12/27/2019  Pre-op Diagnosis: Lung nodule  Post-op Diagnosis: Lung nodule  Surgeon: Garner Nash, DO  Assistants: None   Anesthesia: General endotracheal anesthesia  Operation: Flexible video fiberoptic bronchoscopy with electromagnetic navigation and biopsies.  Estimated Blood Loss: Minimal  Complications: None immediate  Indications and History: [name] is a 74 y.o. male with right upper lobe pulmonary nodule, 1.23 cm.  The risks, benefits, complications, treatment options and expected outcomes were discussed with the patient.  The possibilities of pneumothorax, pneumonia, reaction to medication, pulmonary aspiration, perforation of a viscus, bleeding, failure to diagnose a condition and creating a complication requiring transfusion or operation were discussed with the patient who freely signed the consent.    Description of Procedure: The patient was seen in the Preoperative Area, was examined and was deemed appropriate to proceed.  The patient was taken to [facility] endoscopy room 2, identified as [name] and the procedure verified as Flexible Video Fiberoptic Bronchoscopy.  A Time Out was held and the above information confirmed.   Prior to the date of the procedure a high-resolution CT scan of the chest was performed. Utilizing [place] a virtual tracheobronchial tree was generated to allow the creation of distinct navigation pathways to the patient's parenchymal abnormalities. After being taken to the operating room general anesthesia was initiated and the patient  was orally intubated. The video fiberoptic bronchoscope was introduced via the endotracheal tube and a general inspection was performed which showed normal right and left lung anatomy  with no evidence of endobronchial lesion. The extendable working channel and locator guide were introduced into the bronchoscope. The distinct navigation pathways prepared prior to this procedure were then utilized to navigate to within 1.5 cm of patient's lesion(s) identified on CT scan.  The navigation to the nodule was complex.  We used full fluoroscopic sweep for local registration with an APL breath-hold at 25.  To be able to get into the apical segment appropriately we used a 1.7 cm Olympus thin bronchoscope.  Under Hydro dissection and airway stenting with saline we were able to advance within 3 subdivisions of the apical right lung.  Under fluoroscopy we could approach an approximate the lesion.  The scope working channel was too small for passage of the radial endobronchial ultrasound.  We used a gastroenterology [facility] scientific guidewire 1.08 mm.  This was passed through the 1.7 thin scope under direct fluoroscopy.  The thin scope was then retracted over top of the soft guidewire.  The entire scope was removed with the wire remaining within the airway.  We then used the standard Olympus therapeutic bronchoscope with the navigational working channel and place with removal of the navigation probe.  This was then passed Seldinger technique over the wire through the patient's airway.  With camera on screen were able to drive the working channel over the wire up into the apical segment over top of the wire.  The wire was then removed under fluoroscopy and the navigational probe was inserted into the airway.  At this point we were 1.5 cm from the target location with an approximate level plane.  The extendable working channel was secured into place and the locator guide was withdrawn. Under fluoroscopic guidance transbronchial needle brushings, transbronchial Wang needle biopsies, and transbronchial forceps biopsies were performed to be sent for cytology and pathology.  Following tissue sampling we  inserted the fiducial guide sheath.  The catheter was positioned within 2 cm of the lesion distally and superiorly.  2 fiducials were dropped within these locations.  The working channel was removed from the apical segment and reinserted through a different segment and a third fiducial was placed with approximately 3 cm from the target area. A bronchioalveolar lavage was performed in the right upper lobe and sent for cytology. At the end of the procedure a general airway inspection was performed and there was no evidence of active bleeding. The bronchoscope was removed.  The patient tolerated the procedure well. There was no significant blood loss and there were no obvious complications. A post-procedural chest x-ray is pending.  Samples: 1. Transbronchial needle brushings from right upper lobe 2. Transbronchial Wang needle biopsies from right upper lobe 3. Transbronchial forceps biopsies from right upper lobe 4. Bronchoalveolar lavage from right upper lobe  Plans:  The patient will be discharged from the PACU to home when recovered from anesthesia and after chest x-ray is reviewed. We will review the cytology, pathology results with the patient when they become available. Outpatient followup will be with Garner Nash, DO.   Garner Nash, DO [facility] 12/27/2019 9:51 AM

## 2019-12-27 NOTE — Transfer of Care (Signed)
Immediate Anesthesia Transfer of Care Note  Patient: [name]  Procedure(s) Performed: VIDEO BRONCHOSCOPY WITH ENDOBRONCHIAL NAVIGATION (N/A ) BRONCHIAL BRUSHINGS BRONCHIAL NEEDLE ASPIRATION BIOPSIES BRONCHIAL BIOPSIES FIDUCIAL MARKER PLACEMENT  Patient Location: PACU  Anesthesia Type:General  Level of Consciousness: drowsy and patient cooperative  Airway & Oxygen Therapy: Patient Spontanous Breathing  Post-op Assessment: Report given to RN and Post -op Vital signs reviewed and stable  Post vital signs: Reviewed and stable  Last Vitals:  Vitals Value Taken Time  BP    Temp    Pulse 66 12/27/19 0951  Resp 8 12/27/19 0951  SpO2 96 % 12/27/19 0951  Vitals shown include unvalidated device data.  Last Pain:  Vitals:   12/27/19 0609  TempSrc: Oral  PainSc:          Complications: No apparent anesthesia complications

## 2019-12-27 NOTE — Anesthesia Postprocedure Evaluation (Signed)
Anesthesia Post Note  Patient: [name]  Procedure(s) Performed: VIDEO BRONCHOSCOPY WITH ENDOBRONCHIAL NAVIGATION (N/A ) BRONCHIAL BRUSHINGS BRONCHIAL NEEDLE ASPIRATION BIOPSIES BRONCHIAL BIOPSIES FIDUCIAL MARKER PLACEMENT     Patient location during evaluation: PACU Anesthesia Type: General Level of consciousness: awake Pain management: pain level controlled Vital Signs Assessment: post-procedure vital signs reviewed and stable Respiratory status: spontaneous breathing Cardiovascular status: stable Postop Assessment: no apparent nausea or vomiting Anesthetic complications: no    Last Vitals:  Vitals:   12/27/19 1045 12/27/19 1055  BP:  110/63  Pulse: 62 62  Resp: 16 16  Temp:  36.5 C  SpO2: 95% 94%    Last Pain:  Vitals:   12/27/19 1055  TempSrc:   PainSc: 0-No pain                 Emilyrose Darrah

## 2019-12-28 LAB — CYTOLOGY - NON PAP

## 2019-12-29 LAB — SURGICAL PATHOLOGY

## 2019-12-29 LAB — CYTOLOGY - NON PAP

## 2020-01-02 ENCOUNTER — Telehealth: Payer: Self-pay | Admitting: *Deleted

## 2020-01-02 DIAGNOSIS — R911 Solitary pulmonary nodule: Secondary | ICD-10-CM

## 2020-01-02 NOTE — Telephone Encounter (Signed)
Oncology Nurse Navigator Documentation  Oncology Nurse Navigator Flowsheets 01/02/2020  Navigator Location [facility]  Referral Date to RadOnc/MedOnc 12/30/2019  Navigator Encounter Type Telephone/I called and scheduled him to be seen with Dr. Julien Nordmann tomorrow.  He verbalized understanding of appt time and place.   Telephone Outgoing Call  Treatment Phase Pre-Tx/Tx Discussion  Barriers/Navigation Needs Coordination of Care;Education  Education Other  Interventions Coordination of Care;Education  Acuity Level 2-Minimal Needs (1-2 Barriers Identified)  Coordination of Care Appts  Education Method Verbal  Time Spent with Patient 30

## 2020-01-03 ENCOUNTER — Inpatient Hospital Stay: Payer: Medicare Other | Attending: Internal Medicine

## 2020-01-03 ENCOUNTER — Encounter: Payer: Self-pay | Admitting: Internal Medicine

## 2020-01-03 ENCOUNTER — Other Ambulatory Visit: Payer: Self-pay

## 2020-01-03 ENCOUNTER — Inpatient Hospital Stay: Payer: Medicare Other | Admitting: Internal Medicine

## 2020-01-03 DIAGNOSIS — I700 Atherosclerosis of aorta: Secondary | ICD-10-CM | POA: Insufficient documentation

## 2020-01-03 DIAGNOSIS — G4730 Sleep apnea, unspecified: Secondary | ICD-10-CM | POA: Diagnosis not present

## 2020-01-03 DIAGNOSIS — G4733 Obstructive sleep apnea (adult) (pediatric): Secondary | ICD-10-CM | POA: Diagnosis not present

## 2020-01-03 DIAGNOSIS — R110 Nausea: Secondary | ICD-10-CM | POA: Insufficient documentation

## 2020-01-03 DIAGNOSIS — R911 Solitary pulmonary nodule: Secondary | ICD-10-CM

## 2020-01-03 DIAGNOSIS — Z79899 Other long term (current) drug therapy: Secondary | ICD-10-CM | POA: Diagnosis not present

## 2020-01-03 DIAGNOSIS — J449 Chronic obstructive pulmonary disease, unspecified: Secondary | ICD-10-CM | POA: Diagnosis not present

## 2020-01-03 DIAGNOSIS — E785 Hyperlipidemia, unspecified: Secondary | ICD-10-CM | POA: Insufficient documentation

## 2020-01-03 DIAGNOSIS — Z5111 Encounter for antineoplastic chemotherapy: Secondary | ICD-10-CM | POA: Insufficient documentation

## 2020-01-03 DIAGNOSIS — Z7189 Other specified counseling: Secondary | ICD-10-CM

## 2020-01-03 DIAGNOSIS — I4892 Unspecified atrial flutter: Secondary | ICD-10-CM | POA: Insufficient documentation

## 2020-01-03 DIAGNOSIS — I701 Atherosclerosis of renal artery: Secondary | ICD-10-CM | POA: Insufficient documentation

## 2020-01-03 DIAGNOSIS — I2510 Atherosclerotic heart disease of native coronary artery without angina pectoris: Secondary | ICD-10-CM | POA: Diagnosis not present

## 2020-01-03 DIAGNOSIS — C3491 Malignant neoplasm of unspecified part of right bronchus or lung: Secondary | ICD-10-CM | POA: Diagnosis not present

## 2020-01-03 DIAGNOSIS — Z800 Family history of malignant neoplasm of digestive organs: Secondary | ICD-10-CM | POA: Diagnosis not present

## 2020-01-03 DIAGNOSIS — Z7901 Long term (current) use of anticoagulants: Secondary | ICD-10-CM | POA: Insufficient documentation

## 2020-01-03 DIAGNOSIS — I10 Essential (primary) hypertension: Secondary | ICD-10-CM | POA: Diagnosis not present

## 2020-01-03 DIAGNOSIS — Z87891 Personal history of nicotine dependence: Secondary | ICD-10-CM | POA: Insufficient documentation

## 2020-01-03 DIAGNOSIS — Z7689 Persons encountering health services in other specified circumstances: Secondary | ICD-10-CM | POA: Diagnosis not present

## 2020-01-03 DIAGNOSIS — I119 Hypertensive heart disease without heart failure: Secondary | ICD-10-CM | POA: Insufficient documentation

## 2020-01-03 DIAGNOSIS — Z87442 Personal history of urinary calculi: Secondary | ICD-10-CM | POA: Diagnosis not present

## 2020-01-03 DIAGNOSIS — I252 Old myocardial infarction: Secondary | ICD-10-CM | POA: Diagnosis not present

## 2020-01-03 DIAGNOSIS — C3411 Malignant neoplasm of upper lobe, right bronchus or lung: Secondary | ICD-10-CM | POA: Insufficient documentation

## 2020-01-03 LAB — CBC WITH DIFFERENTIAL (CANCER CENTER ONLY)
Abs Immature Granulocytes: 0.01 10*3/uL (ref 0.00–0.07)
Basophils Absolute: 0.1 10*3/uL (ref 0.0–0.1)
Basophils Relative: 1 %
Eosinophils Absolute: 0.4 10*3/uL (ref 0.0–0.5)
Eosinophils Relative: 5 %
HCT: 50.1 % (ref 39.0–52.0)
Hemoglobin: 16.8 g/dL (ref 13.0–17.0)
Immature Granulocytes: 0 %
Lymphocytes Relative: 23 %
Lymphs Abs: 1.8 10*3/uL (ref 0.7–4.0)
MCH: 33.3 pg (ref 26.0–34.0)
MCHC: 33.5 g/dL (ref 30.0–36.0)
MCV: 99.4 fL (ref 80.0–100.0)
Monocytes Absolute: 0.8 10*3/uL (ref 0.1–1.0)
Monocytes Relative: 10 %
Neutro Abs: 4.9 10*3/uL (ref 1.7–7.7)
Neutrophils Relative %: 61 %
Platelet Count: 167 10*3/uL (ref 150–400)
RBC: 5.04 MIL/uL (ref 4.22–5.81)
RDW: 14.4 % (ref 11.5–15.5)
WBC Count: 7.9 10*3/uL (ref 4.0–10.5)
nRBC: 0 % (ref 0.0–0.2)

## 2020-01-03 LAB — CMP (CANCER CENTER ONLY)
ALT: 27 U/L (ref 0–44)
AST: 24 U/L (ref 15–41)
Albumin: 4 g/dL (ref 3.5–5.0)
Alkaline Phosphatase: 98 U/L (ref 38–126)
Anion gap: 8 (ref 5–15)
BUN: 21 mg/dL (ref 8–23)
CO2: 28 mmol/L (ref 22–32)
Calcium: 9.7 mg/dL (ref 8.9–10.3)
Chloride: 102 mmol/L (ref 98–111)
Creatinine: 1.4 mg/dL — ABNORMAL HIGH (ref 0.61–1.24)
GFR, Est AFR Am: 58 mL/min — ABNORMAL LOW (ref >60)
GFR, Estimated: 50 mL/min — ABNORMAL LOW (ref >60)
Glucose, Bld: 137 mg/dL — ABNORMAL HIGH (ref 70–99)
Potassium: 4.1 mmol/L (ref 3.5–5.1)
Sodium: 138 mmol/L (ref 135–145)
Total Bilirubin: 0.8 mg/dL (ref 0.3–1.2)
Total Protein: 7.5 g/dL (ref 6.5–8.1)

## 2020-01-03 MED ORDER — PROCHLORPERAZINE MALEATE 10 MG PO TABS
10.0000 mg | ORAL_TABLET | Freq: Four times a day (QID) | ORAL | 0 refills | Status: DC | PRN
Start: 2020-01-03 — End: 2020-03-01

## 2020-01-03 NOTE — Progress Notes (Signed)
START ON PATHWAY REGIMEN - Small Cell Lung     A cycle is every 21 days:     Etoposide      Carboplatin   **Always confirm dose/schedule in your pharmacy ordering system**  Patient Characteristics: Newly Diagnosed, Preoperative or Nonsurgical Candidate (Clinical Staging), First Line, Limited Stage, Nonsurgical Candidate Therapeutic Status: Newly Diagnosed, Preoperative or Nonsurgical Candidate (Clinical Staging) AJCC T Category: cT1b AJCC N Category: cN0 AJCC M Category: cM0 AJCC 8 Stage Grouping: IA2 Stage Classification: Limited Surgical Candidacy: Nonsurgical Candidate Intent of Therapy: Curative Intent, Discussed with Patient

## 2020-01-03 NOTE — Patient Instructions (Signed)
Etoposide, VP-16 injection What is this medicine? ETOPOSIDE, VP-16 (e toe POE side) is a chemotherapy drug. It is used to treat testicular cancer, lung cancer, and other cancers. This medicine may be used for other purposes; ask your health care provider or pharmacist if you have questions. COMMON BRAND NAME(S): Etopophos, Toposar, VePesid What should I tell my health care provider before I take this medicine? They need to know if you have any of these conditions:  infection  kidney disease  liver disease  low blood counts, like low white cell, platelet, or red cell counts  an unusual or allergic reaction to etoposide, other medicines, foods, dyes, or preservatives  pregnant or trying to get pregnant  breast-feeding How should I use this medicine? This medicine is for infusion into a vein. It is administered in a hospital or clinic by a specially trained health care professional. Talk to your pediatrician regarding the use of this medicine in children. Special care may be needed. Overdosage: If you think you have taken too much of this medicine contact a poison control center or emergency room at once. NOTE: This medicine is only for you. Do not share this medicine with others. What if I miss a dose? It is important not to miss your dose. Call your doctor or health care professional if you are unable to keep an appointment. What may interact with this medicine? This medicine may interact with the following medications:  warfarin This list may not describe all possible interactions. Give your health care provider a list of all the medicines, herbs, non-prescription drugs, or dietary supplements you use. Also tell them if you smoke, drink alcohol, or use illegal drugs. Some items may interact with your medicine. What should I watch for while using this medicine? Visit your doctor for checks on your progress. This drug may make you feel generally unwell. This is not uncommon, as  chemotherapy can affect healthy cells as well as cancer cells. Report any side effects. Continue your course of treatment even though you feel ill unless your doctor tells you to stop. In some cases, you may be given additional medicines to help with side effects. Follow all directions for their use. Call your doctor or health care professional for advice if you get a fever, chills or sore throat, or other symptoms of a cold or flu. Do not treat yourself. This drug decreases your body's ability to fight infections. Try to avoid being around people who are sick. This medicine may increase your risk to bruise or bleed. Call your doctor or health care professional if you notice any unusual bleeding. Talk to your doctor about your risk of cancer. You may be more at risk for certain types of cancers if you take this medicine. Do not become pregnant while taking this medicine or for at least 6 months after stopping it. Women should inform their doctor if they wish to become pregnant or think they might be pregnant. Women of child-bearing potential will need to have a negative pregnancy test before starting this medicine. There is a potential for serious side effects to an unborn child. Talk to your health care professional or pharmacist for more information. Do not breast-feed an infant while taking this medicine. Men must use a latex condom during sexual contact with a woman while taking this medicine and for at least 4 months after stopping it. A latex condom is needed even if you have had a vasectomy. Contact your doctor right away if your partner  becomes pregnant. Do not donate sperm while taking this medicine and for at least 4 months after you stop taking this medicine. Men should inform their doctors if they wish to father a child. This medicine may lower sperm counts. What side effects may I notice from receiving this medicine? Side effects that you should report to your doctor or health care professional  as soon as possible:  allergic reactions like skin rash, itching or hives, swelling of the face, lips, or tongue  low blood counts - this medicine may decrease the number of white blood cells, red blood cells, and platelets. You may be at increased risk for infections and bleeding  nausea, vomiting  redness, blistering, peeling or loosening of the skin, including inside the mouth  signs and symptoms of infection like fever; chills; cough; sore throat; pain or trouble passing urine  signs and symptoms of low red blood cells or anemia such as unusually weak or tired; feeling faint or lightheaded; falls; breathing problems  unusual bruising or bleeding Side effects that usually do not require medical attention (report to your doctor or health care professional if they continue or are bothersome):  changes in taste  diarrhea  hair loss  loss of appetite  mouth sores This list may not describe all possible side effects. Call your doctor for medical advice about side effects. You may report side effects to FDA at 1-800-FDA-1088. Where should I keep my medicine? This drug is given in a hospital or clinic and will not be stored at home. NOTE: This sheet is a summary. It may not cover all possible information. If you have questions about this medicine, talk to your doctor, pharmacist, or health care provider.  2020 Elsevier/Gold Standard (2018-10-13 16:57:15) Carboplatin injection What is this medicine? CARBOPLATIN (KAR boe pla tin) is a chemotherapy drug. It targets fast dividing cells, like cancer cells, and causes these cells to die. This medicine is used to treat ovarian cancer and many other cancers. This medicine may be used for other purposes; ask your health care provider or pharmacist if you have questions. COMMON BRAND NAME(S): Paraplatin What should I tell my health care provider before I take this medicine? They need to know if you have any of these conditions:  blood  disorders  hearing problems  kidney disease  recent or ongoing radiation therapy  an unusual or allergic reaction to carboplatin, cisplatin, other chemotherapy, other medicines, foods, dyes, or preservatives  pregnant or trying to get pregnant  breast-feeding How should I use this medicine? This drug is usually given as an infusion into a vein. It is administered in a hospital or clinic by a specially trained health care professional. Talk to your pediatrician regarding the use of this medicine in children. Special care may be needed. Overdosage: If you think you have taken too much of this medicine contact a poison control center or emergency room at once. NOTE: This medicine is only for you. Do not share this medicine with others. What if I miss a dose? It is important not to miss a dose. Call your doctor or health care professional if you are unable to keep an appointment. What may interact with this medicine?  medicines for seizures  medicines to increase blood counts like filgrastim, pegfilgrastim, sargramostim  some antibiotics like amikacin, gentamicin, neomycin, streptomycin, tobramycin  vaccines Talk to your doctor or health care professional before taking any of these medicines:  acetaminophen  aspirin  ibuprofen  ketoprofen  naproxen This list  may not describe all possible interactions. Give your health care provider a list of all the medicines, herbs, non-prescription drugs, or dietary supplements you use. Also tell them if you smoke, drink alcohol, or use illegal drugs. Some items may interact with your medicine. What should I watch for while using this medicine? Your condition will be monitored carefully while you are receiving this medicine. You will need important blood work done while you are taking this medicine. This drug may make you feel generally unwell. This is not uncommon, as chemotherapy can affect healthy cells as well as cancer cells. Report any  side effects. Continue your course of treatment even though you feel ill unless your doctor tells you to stop. In some cases, you may be given additional medicines to help with side effects. Follow all directions for their use. Call your doctor or health care professional for advice if you get a fever, chills or sore throat, or other symptoms of a cold or flu. Do not treat yourself. This drug decreases your body's ability to fight infections. Try to avoid being around people who are sick. This medicine may increase your risk to bruise or bleed. Call your doctor or health care professional if you notice any unusual bleeding. Be careful brushing and flossing your teeth or using a toothpick because you may get an infection or bleed more easily. If you have any dental work done, tell your dentist you are receiving this medicine. Avoid taking products that contain aspirin, acetaminophen, ibuprofen, naproxen, or ketoprofen unless instructed by your doctor. These medicines may hide a fever. Do not become pregnant while taking this medicine. Women should inform their doctor if they wish to become pregnant or think they might be pregnant. There is a potential for serious side effects to an unborn child. Talk to your health care professional or pharmacist for more information. Do not breast-feed an infant while taking this medicine. What side effects may I notice from receiving this medicine? Side effects that you should report to your doctor or health care professional as soon as possible:  allergic reactions like skin rash, itching or hives, swelling of the face, lips, or tongue  signs of infection - fever or chills, cough, sore throat, pain or difficulty passing urine  signs of decreased platelets or bleeding - bruising, pinpoint red spots on the skin, black, tarry stools, nosebleeds  signs of decreased red blood cells - unusually weak or tired, fainting spells, lightheadedness  breathing  problems  changes in hearing  changes in vision  chest pain  high blood pressure  low blood counts - This drug may decrease the number of white blood cells, red blood cells and platelets. You may be at increased risk for infections and bleeding.  nausea and vomiting  pain, swelling, redness or irritation at the injection site  pain, tingling, numbness in the hands or feet  problems with balance, talking, walking  trouble passing urine or change in the amount of urine Side effects that usually do not require medical attention (report to your doctor or health care professional if they continue or are bothersome):  hair loss  loss of appetite  metallic taste in the mouth or changes in taste This list may not describe all possible side effects. Call your doctor for medical advice about side effects. You may report side effects to FDA at 1-800-FDA-1088. Where should I keep my medicine? This drug is given in a hospital or clinic and will not be stored at home.  NOTE: This sheet is a summary. It may not cover all possible information. If you have questions about this medicine, talk to your doctor, pharmacist, or health care provider.  2020 Elsevier/Gold Standard (2007-11-23 14:38:05) Lung Cancer Lung cancer is an abnormal growth of cancerous cells that forms a mass (malignant tumor) in a lung. There are several types of lung cancer. The types are based on the appearance of the tumor cells. The two most common types are:  Non-small cell lung cancer. This type of lung cancer is the most common type. Non-small cell lung cancers include squamous cell carcinoma, adenocarcinoma, and large cell carcinoma.  Small cell lung cancer. In this type of lung cancer, abnormal cells are smaller than those of non-small cell lung cancer. Small cell lung cancer gets worse (progresses) faster than non-small cell lung cancer. What are the causes? The most common cause of lung cancer is smoking tobacco.  The second most common cause is exposure to a chemical called radon. What increases the risk? You are more likely to develop this condition if:  You smoke tobacco.  You have been exposed to: ? Secondhand tobacco smoke. ? Radon gas. ? Uranium. ? Asbestos. ? Arsenic in drinking water. ? Air pollution.  You have a family or personal history of lung cancer.  You have had lung radiation therapy in the past.  You are older than age 61. What are the signs or symptoms? In the early stages, you may not have any symptoms. As the cancer progresses, symptoms may include:  A lasting cough, possibly with blood.  Fatigue.  Unexplained weight loss.  Shortness of breath.  Loud breathing (wheezing).  Chest pain.  Loss of appetite. Symptoms of advanced lung cancer include:  Hoarseness.  Bone or joint pain.  Weakness.  Change in the structure of the fingernails (clubbing), so that the nail looks like an upside-down spoon.  Swelling of the face or arms.  Inability to move the face (paralysis).  Drooping eyelids. How is this diagnosed? This condition may be diagnosed based on:  Your symptoms and medical history.  A physical exam.  A chest X-ray.  A CT scan.  Blood tests.  Sputum tests.  Removal of a sample of lung tissue (lung biopsy) for testing. Your cancer will be assessed (staged) to determine how severe it is and how much it has spread (metastasized). How is this treated? Treatment depends on the type and stage of your cancer. Treatment may include one or more of the following:  Surgery to remove as much of the cancer as possible. Lymph nodes in the area may be removed and tested for cancer as well.  Medicines that kill cancer cells (chemotherapy).  High-energy rays that kill cancer cells (radiation therapy).  Chemotherapy. This treatment uses medicines to destroy cancer cells.  Targeted therapy. This targets specific parts of cancer cells and the area  around them to block the growth and spread of the cancer. Targeted therapy can help limit the damage to healthy cells. Follow these instructions at home: Eating and drinking  Some of your treatments might affect your appetite. If you are having problems eating, or if you do not have an appetite, meet with a dietitian.  If you have side effects that affect your appetite, it may help to: ? Eat smaller meals and snacks often. ? Drink high-nutrition and high-calorie shakes or supplements. ? Eat bland and soft foods that are easy to eat. ? Avoid eating foods that are hot, spicy, or hard  to swallow. General instructions   Do not use any products that contain nicotine or tobacco, such as cigarettes and e-cigarettes. If you need help quitting, ask your health care provider.  Do not drink alcohol.  If you are admitted to the hospital, make sure your cancer specialist (oncologist) is aware. Your cancer may affect your treatment for other conditions.  Take over-the-counter and prescription medicines only as told by your health care provider.  Consider joining a support group for people who have been diagnosed with lung cancer.  Work with your health care provider to manage any side effects of treatment.  Keep all follow-up visits as told by your health care provider. This is important. Where to find more information  American Cancer Society: https://www.cancer.[place] https://www.cancer.gov Contact a health care provider if you:  Lose weight without trying.  Have a persistent cough and wheezing.  Feel short of breath.  Get tired easily.  Have bone or joint pain.  Have difficulty swallowing.  Notice that your voice is changing or getting hoarse.  Have pain that does not get better with medicine. Get help right away if you:  Cough up blood.  Have new breathing problems.  Have chest pain.  Have a fever.  Have swelling in an ankle, leg, or  arm, or the face or neck.  Have paralysis in your face.  Are very confused.  Have a drooping eyelid. Summary  Lung cancer is an abnormal growth of cancerous cells that forms a mass (malignant tumor) in a lung.  There are several types of lung cancer. The types are based on the appearance of the tumor cells. The two most common types are non-small cell and small cell.  The most common cause of lung cancer is smoking tobacco.  Early symptoms include a lasting cough, possibly with blood, and fatigue, unexplained weight loss, and shortness of breath.  After diagnosis, treatment depends on the type and stage of your cancer. This information is not intended to replace advice given to you by your health care provider. Make sure you discuss any questions you have with your health care provider. Document Revised: 07/31/2017 Document Reviewed: 06/25/2017 Elsevier Patient Education  2020 [employer].

## 2020-01-03 NOTE — Progress Notes (Signed)
[place] Telephone:([phone]   Fax:([phone]) [phone]  CONSULT NOTE  REFERRING PHYSICIAN: Dr. Leory Plowman Icard  REASON FOR CONSULTATION:  74 years old white male recently diagnosed with lung cancer.  HPI [name] is a 74 y.o. male with past medical history significant for multiple medical problems including atrial flutter, history of testicular cancer status post surgery and radiation in 1980, COPD, coronary artery disease, dyslipidemia, hypertension, myocardial infarction, obstructive sleep apnea, renal artery stenosis as well as prediabetes.  The patient also has a long history of smoking.  He was diagnosed with respiratory viral infection in 2019 and was followed by Dr. Vaughan Browner.  He had CT scan of the chest on November 16, 2019 and it showed 1.0 cm apical segment right upper lobe nodule increased from 0.4 cm on the previous scan performed year before.  The patient had a PET scan on November 28, 2019 and it showed hypermetabolic 1.0 cm apical segment right upper lobe nodule with SUV max of 5.0.  There was no hypermetabolic mediastinal, hilar or axillary lymphadenopathy.  The patient has cirrhotic liver and no other evidence of metastatic disease in the abdomen or pelvis.  The patient had CTs over the of the chest on December 19, 2019 and it showed slight interval enlargement of the previously PET avid nodule of the posterior apical segment right upper lobe measuring 1.2 cm highly concerning for malignancy. On December 27, 2019 the patient underwent navigational bronchoscopy under the care of Dr. Valeta Harms.  The final pathology ([id number]) showed malignant cells consistent with small cell carcinoma. Immunohistochemistry for CKAE1AE3, Synaptophysin and TTF-1 is positive.  Dr. Valeta Harms kindly referred the patient to me today for evaluation and recommendation regarding treatment of his condition. When seen today the patient is feeling fine with no concerning complaints except for the chest  congestion mild cough.  He also has shortness of breath with exertion with no significant chest pain or hemoptysis.  He has no nausea, vomiting, diarrhea or constipation.  He denied having any headache or visual changes.  He lost around 18 pounds in the last 5 months but this was intentional. Family history significant for mother with ovarian cancer and acute leukemia.  Father had colon cancer and daughter had evaluated. The patient is single and has 1 daughter.  He was accompanied by his friend Mayte.  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  He has a history of smoking cigarette and cigar for a total of 52 years he has no history of alcohol or drug abuse.  HPI  Past Medical History:  Diagnosis Date  . Atypical atrial flutter ([place])   . Cancer [facility])    Testicular- surgery   . COPD (chronic obstructive pulmonary disease) ([place])   . Coronary artery disease   . Diastolic dysfunction, left ventricle 05/2018   Dr. Rayann Heman- Cardiologist  . Dysrhythmia    chronic AFib  . History of kidney stones    passed  . HLD (hyperlipidemia)   . HTN (hypertension)   . Myocardial infarction ([place])    x 2 maybe 1 more  . OSA (obstructive sleep apnea)    uses CPAP  . Peripheral artery disease ([place])    pseudoaneurysm post afib ablation at [facility] 2011, s/p bilateral iliac stents  . Persistent atrial fibrillation ([place])   . Pre-diabetes   . Renal artery stenosis ([facility])    right renal artery PTA and stenting  . S/P coronary artery bypass graft x 7  11/16/96    Past Surgical History:  Procedure Laterality Date  . 2-D echocardiogram  03/25/2010   Normal left ventricular function. Mild MR, TR, trivial AR  . ATRIAL FIBRILLATION ABLATION  03/27/2010, 12/31/2010   [facility], Dr. Nadeen Landau  . BACK SURGERY  2004, 2007   Laminectomy, Discedectomy x 2  . BRONCHIAL BIOPSY  12/27/2019   Procedure: BRONCHIAL BIOPSIES;  Surgeon: Garner Nash, DO;  Location: [facility] ENDOSCOPY;  Service: Pulmonary;;  . BRONCHIAL BRUSHINGS   12/27/2019   Procedure: BRONCHIAL BRUSHINGS;  Surgeon: Garner Nash, DO;  Location: [place];  Service: Pulmonary;;  . BRONCHIAL NEEDLE ASPIRATION BIOPSY  12/27/2019   Procedure: BRONCHIAL NEEDLE ASPIRATION BIOPSIES;  Surgeon: Garner Nash, DO;  Location: [facility];  Service: Pulmonary;;  . cardiac stress test  11/21/2009   Exercise capacity 5 METS. No significant ischemia demonstrated  . CARDIOVERSION N/A 03/15/2015   Procedure: CARDIOVERSION;  Surgeon: Lorretta Harp, MD;  Location: [facility] ENDOSCOPY;  Service: Cardiovascular;  Laterality: N/A;  . CARDIOVERSION N/A 07/10/2017   Procedure: CARDIOVERSION;  Surgeon: Pixie Casino, MD;  Location: [facility] ENDOSCOPY;  Service: Cardiovascular;  Laterality: N/A;  . CARDIOVERSION N/A 03/23/2018   Procedure: CARDIOVERSION;  Surgeon: Sanda Klein, MD;  Location: [facility] ENDOSCOPY;  Service: Cardiovascular;  Laterality: N/A;  . CORONARY ARTERY BYPASS GRAFT  1998  . ELECTROMAGNETIC NAVIGATION BROCHOSCOPY  12/27/2019   Procedure: NAVIGATION BRONCHOSCOPY;  Surgeon: Garner Nash, DO;  Location: [facility] ENDOSCOPY;  Service: Pulmonary;;  . FIDUCIAL MARKER PLACEMENT  12/27/2019   Procedure: FIDUCIAL MARKER PLACEMENT;  Surgeon: Garner Nash, DO;  Location: [facility] ENDOSCOPY;  Service: Pulmonary;;  . ORCHIECTOMY  1981   for cancer  . TOOTH EXTRACTION  05/27/2013   tooth extraction with bone graft- several -for Implants  . VIDEO BRONCHOSCOPY WITH ENDOBRONCHIAL NAVIGATION N/A 12/27/2019   Procedure: VIDEO BRONCHOSCOPY;  Surgeon: Garner Nash, DO;  Location: [facility];  Service: Pulmonary;  Laterality: N/A;    Family History  Problem Relation Age of Onset  . Cancer Mother   . Cancer Father   . Other Brother        NO MEDICAL PROBLEMS  . Other Son        NO MEDICAL PROBLEMS  . Leukemia Daughter 6       Recover and has no other problems    Social History Social History   Tobacco Use  . Smoking status: Former Smoker    Packs/day: 1.50     Years: 30.00    Pack years: 45.00    Types: Cigarettes, Cigars    Quit date: 09/01/1994    Years since quitting: 25.3  . Smokeless tobacco: Never Used  . Tobacco comment: cigars quit  - 11/16/2019  Substance Use Topics  . Alcohol use: Not Currently  . Drug use: No    No Known Allergies  Current Outpatient Medications  Medication Sig Dispense Refill  . acetaminophen (TYLENOL) 500 MG tablet Take 1,000 mg every 6 (six) hours as needed by mouth (for pain.).    [name] atorvastatin (LIPITOR) 10 MG tablet TAKE 1 TABLET(10 MG) BY MOUTH DAILY (Patient taking differently: Take 10 mg by mouth every evening. TAKE 1 TABLET(10 MG) BY MOUTH DAILY) 90 tablet 2  . budesonide-formoterol (SYMBICORT) 160-4.5 MCG/ACT inhaler Inhale 2 puffs into the lungs 2 (two) times daily. 10.2 g 5  . cetirizine (ZYRTEC) 10 MG tablet Take 10 mg by mouth daily.    [name] dronedarone (MULTAQ) 400 MG tablet TAKE 1  TABLET BY MOUTH TWICE DAILY WITH MEAL (Patient taking differently: Take 400 mg by mouth in the morning and at bedtime. TAKE 1 TABLET BY MOUTH TWICE DAILY WITH MEAL) 180 tablet 1  . empagliflozin (JARDIANCE) 10 MG TABS tablet Take 10 mg by mouth daily. Schedule appt with PCP 90 tablet 2  . Ferrous Sulfate (SLOW FE) 142 (45 Fe) MG TBCR Take 45 mg by mouth at bedtime.     . furosemide (LASIX) 40 MG tablet TAKE 1 TABLET BY MOUTH AS NEEDED (Patient taking differently: Take 40 mg by mouth daily as needed for fluid. ) 30 tablet 3  . gabapentin (NEURONTIN) 300 MG capsule Take 2 capsules (600 mg total) by mouth at bedtime. 180 capsule 2  . ketotifen (ZADITOR) 0.025 % ophthalmic solution Place 1 drop into both eyes 2 (two) times daily as needed (allergies).    [name] lisinopril-hydrochlorothiazide (ZESTORETIC) 10-12.5 MG tablet TAKE 1 TABLET BY MOUTH DAILY (Patient taking differently: Take 1 tablet by mouth every evening. ) 90 tablet 1  . metoprolol succinate (TOPROL-XL) 100 MG 24 hr tablet TAKE 1 TABLET(100 MG) BY MOUTH DAILY (Patient taking  differently: Take 100 mg by mouth every evening. TAKE 1 TABLET(100 MG) BY MOUTH DAILY) 90 tablet 3  . Multiple Vitamin (MULTIVITAMIN WITH MINERALS) TABS tablet Take 1 tablet by mouth daily. In the morning.    . niacin (NIASPAN) 1000 MG CR tablet TAKE 1 TABLET(1000 MG) BY MOUTH AT BEDTIME (Patient taking differently: Take 1,000 mg by mouth at bedtime. ) 90 tablet 3  . NON FORMULARY CPAP at night    . traMADol (ULTRAM) 50 MG tablet TAKE 1 TABLET BY MOUTH EVERY 12 HOURS AS NEEDED (Patient taking differently: Take 50 mg by mouth 2 (two) times daily as needed (pain after golf). ) 30 tablet 0  . traZODone (DESYREL) 50 MG tablet TAKE 1/2 TO 1 TABLET BY MOUTH AT BEDTIME AS NEEDED FOR SLEEP (Patient taking differently: Take 50 mg by mouth at bedtime. ) 90 tablet 3  . VENTOLIN HFA 108 (90 Base) MCG/ACT inhaler INHALE 2 PUFFS INTO THE LUNGS EVERY 6 HOURS AS NEEDED FOR WHEEZING OR SHORTNESS OF BREATH (Patient taking differently: Inhale 2 puffs into the lungs every 6 (six) hours as needed (wheezing/shortness of breath.). ) 18 g 1  . XARELTO 20 MG TABS tablet TAKE 1 TABLET BY MOUTH EVERY DAY WITH SUPPER (Patient taking differently: Take 20 mg by mouth every evening. ) 90 tablet 1   No current facility-administered medications for this visit.    Review of Systems  Constitutional: positive for weight loss Eyes: negative Ears, nose, mouth, throat, and face: negative Respiratory: positive for cough and dyspnea on exertion Cardiovascular: negative Gastrointestinal: negative Genitourinary:negative Integument/breast: negative Hematologic/lymphatic: negative Musculoskeletal:negative Neurological: negative Behavioral/Psych: negative Endocrine: negative Allergic/Immunologic: negative  Physical Exam  RCV:ELFYB, healthy, no distress, well nourished, well developed and anxious SKIN: skin color, texture, turgor are normal, no rashes or significant lesions HEAD: Normocephalic, No masses, lesions, tenderness or  abnormalities EYES: normal, PERRLA, Conjunctiva are pink and non-injected EARS: External ears normal, Canals clear OROPHARYNX:no exudate, no erythema and lips, buccal mucosa, and tongue normal  NECK: supple, no adenopathy, no JVD LYMPH:  no palpable lymphadenopathy, no hepatosplenomegaly LUNGS: clear to auscultation , and palpation HEART: regular rate & rhythm, no murmurs and no gallops ABDOMEN:abdomen soft, non-tender, obese, normal bowel sounds and no masses or organomegaly BACK: Back symmetric, no curvature., No CVA tenderness EXTREMITIES:no joint deformities, effusion, or inflammation, no edema  NEURO:  alert & oriented x 3 with fluent speech, no focal motor/sensory deficits  PERFORMANCE STATUS: ECOG 1  LABORATORY DATA: Lab Results  Component Value Date   WBC 7.9 01/03/2020   HGB 16.8 01/03/2020   HCT 50.1 01/03/2020   MCV 99.4 01/03/2020   PLT 167 01/03/2020      Chemistry      Component Value Date/Time   NA 140 12/27/2019 0551   NA 142 04/18/2010 0000   K 3.8 12/27/2019 0551   CL 103 12/27/2019 0551   CO2 28 12/27/2019 0551   BUN 18 12/27/2019 0551   CREATININE 1.36 (H) 12/27/2019 0551   CREATININE 1.41 (H) 09/14/2019 0943      Component Value Date/Time   CALCIUM 9.2 12/27/2019 0551   ALKPHOS 71 12/27/2019 0551   AST 33 12/27/2019 0551   ALT 27 12/27/2019 0551   BILITOT 1.2 12/27/2019 0551       RADIOGRAPHIC STUDIES: DG CHEST PORT 1 VIEW  Result Date: 12/27/2019 CLINICAL DATA:  Post right upper lobe bronchoscopy EXAM: PORTABLE CHEST 1 VIEW COMPARISON:  09/15/2018 FINDINGS: Cardiomegaly, prior CABG. Increased airspace opacity medially in the right upper lobe may reflect changes of bronchoscopy. No pneumothorax. No effusions or overt edema. No acute bony abnormality. IMPRESSION: Medial right upper lobe airspace opacity may reflect changes of bronchoscopy. No pneumothorax. Electronically Signed   By: Rolm Baptise M.D.   On: 12/27/2019 10:58   CT Super D Chest Wo  Contrast  Result Date: 12/19/2019 CLINICAL DATA:  Lung nodule, bronchoscopy planning EXAM: CT CHEST WITHOUT CONTRAST TECHNIQUE: Multidetector CT imaging of the chest was performed using thin slice collimation for electromagnetic bronchoscopy planning purposes, without intravenous contrast. COMPARISON:  PET-CT, 11/28/2019, CT chest, 11/16/2019 FINDINGS: Cardiovascular: Aortic atherosclerosis. Normal heart size. Extensive 3 vessel coronary artery calcifications and/or stents. No pericardial effusion. Mediastinum/Nodes: No enlarged mediastinal, hilar, or axillary lymph nodes. Thyroid gland, trachea, and esophagus demonstrate no significant findings. Lungs/Pleura: Slight interval enlargement of a previously PET avid nodule of the posterior apical segment right upper lobe, measuring 1.2 cm (series 3, image 40). No pleural effusion or pneumothorax. Upper Abdomen: No acute abnormality.  Cholelithiasis. Musculoskeletal: No chest wall mass or suspicious bone lesions identified. IMPRESSION: 1. Slight interval enlargement of a previously PET avid nodule of the posterior apical segment right upper lobe, measuring 1.2 cm, which remains highly concerning for malignancy. 2.  Coronary artery disease.  Aortic Atherosclerosis (ICD10-I70.0). Electronically Signed   By: Eddie Candle M.D.   On: 12/19/2019 21:13   DG C-ARM BRONCHOSCOPY  Result Date: 12/27/2019 C-ARM BRONCHOSCOPY: Fluoroscopy was utilized by the requesting physician.  No radiographic interpretation.    ASSESSMENT: This is a very pleasant 74 years old white male recently diagnosed with limited stage, stage Ia (T1b, N0, M0) small cell lung cancer presented with right upper lobe pulmonary nodule that is hypermetabolic and biopsy proven to be consistent with small cell carcinoma diagnosed in April 2021.   PLAN: I had a lengthy discussion with the patient and his girlfriend today about his current disease stage, prognosis and treatment options. I discussed with  the patient the option of surgical resection if he is surgical candidate for resection.  He was seen in the past by Dr. Kipp Brood and I will contact him for confirmation of possible surgery. If the patient is not a good surgical candidate for resection we will proceed with a course of concurrent chemoradiation with chemotherapy in the form of carboplatin for AUC of 5 on day 1  and etoposide 100 mg/M2 on days 1, 2 and 3 every 3 weeks.  The patient is not a great candidate for treatment with cisplatin because of his renal insufficiency and other comorbidities. I discussed with the patient the adverse effect of the chemotherapy including but not limited to alopecia, myelosuppression, nausea and vomiting, peripheral neuropathy, liver or renal dysfunction. The patient would like to proceed with scheduling his chemotherapy to start next week as planned as we are waiting for the final recommendation of Dr. Kipp Brood regarding the possibility of surgical resection. If he is not a surgical candidate I will refer him to radiation oncology for consideration of concurrent radiotherapy. I will complete the staging work-up by ordering MRI of the brain to rule out brain metastasis. I will arrange for the patient to have a chemotherapy education class before the first dose of his treatment. I will send a prescription for Compazine 10 mg p.o. every 6 hours as needed for nausea to his pharmacy. The patient will come back for follow-up visit in 2 weeks for evaluation and management of any adverse effect of his treatment. He was advised to call immediately if he has any other concerning symptoms in the interval. The patient voices understanding of current disease status and treatment options and is in agreement with the current care plan.  All questions were answered. The patient knows to call the clinic with any problems, questions or concerns. We can certainly see the patient much sooner if necessary.  Thank you so much  for allowing me to participate in the care of [employer]. I will continue to follow up the patient with you and assist in his care.  The total time spent in the appointment was 90 minutes.  Disclaimer: This note was dictated with voice recognition software. Similar sounding words can inadvertently be transcribed and may not be corrected upon review.   Eilleen Kempf Jan 03, 2020, 1:45 PM

## 2020-01-04 ENCOUNTER — Encounter: Payer: Self-pay | Admitting: Internal Medicine

## 2020-01-05 ENCOUNTER — Encounter: Payer: Self-pay | Admitting: Internal Medicine

## 2020-01-05 ENCOUNTER — Other Ambulatory Visit: Payer: Self-pay | Admitting: Internal Medicine

## 2020-01-05 ENCOUNTER — Other Ambulatory Visit: Payer: Self-pay | Admitting: Medical Oncology

## 2020-01-05 ENCOUNTER — Other Ambulatory Visit: Payer: Self-pay

## 2020-01-05 ENCOUNTER — Inpatient Hospital Stay: Payer: Medicare Other

## 2020-01-05 DIAGNOSIS — C3491 Malignant neoplasm of unspecified part of right bronchus or lung: Secondary | ICD-10-CM

## 2020-01-05 NOTE — Progress Notes (Signed)
Pharmacist Chemotherapy Monitoring - Initial Assessment    Anticipated start date: 01/11/20    Regimen:  . Are orders appropriate based on the patient's diagnosis, regimen, and cycle? Yes . Does the plan date match the patient's scheduled date? Yes . Is the sequencing of drugs appropriate? Yes . Are the premedications appropriate for the patient's regimen? Yes . Prior Authorization for treatment is: Approved o If applicable, is the correct biosimilar selected based on the patient's insurance? not applicable  Organ Function and Labs: [name] Are dose adjustments needed based on the patient's renal function, hepatic function, or hematologic function? Yes . Are appropriate labs ordered prior to the start of patient's treatment? Yes . Other organ system assessment, if indicated: N/A . The following baseline labs, if indicated, have been ordered: N/A  Dose Assessment: . Are the drug doses appropriate? Yes . Are the following correct: o Drug concentrations Yes o IV fluid compatible with drug Yes o Administration routes Yes o Timing of therapy Yes . If applicable, does the patient have documented access for treatment and/or plans for port-a-cath placement? not applicable . If applicable, have lifetime cumulative doses been properly documented and assessed? not applicable Lifetime Dose Tracking  No doses have been documented on this patient for the following tracked chemicals: Doxorubicin, Epirubicin, Idarubicin, Daunorubicin, Mitoxantrone, Bleomycin, Oxaliplatin, Carboplatin, Liposomal Doxorubicin  o   Toxicity Monitoring/Prevention: . The patient has the following take home antiemetics prescribed: N/A . The patient has the following take home medications prescribed: N/A . Medication allergies and previous infusion related reactions, if applicable, have been reviewed and addressed. Yes . The patient's current medication list has been assessed for drug-drug interactions with their chemotherapy  regimen. no significant drug-drug interactions were identified on review.  Order Review: . Are the treatment plan orders signed? Yes . Is the patient scheduled to see a provider prior to their treatment? Yes  I verify that I have reviewed each item in the above checklist and answered each question accordingly.  Janyth Riera K 01/05/2020 10:26 AM

## 2020-01-09 ENCOUNTER — Other Ambulatory Visit: Payer: Self-pay | Admitting: *Deleted

## 2020-01-09 NOTE — Progress Notes (Signed)
The proposed treatment discussed in cancer conference 01/05/20 is for discussion purpose only and is not a binding recommendation.  The patient was not physically examined nor present for their treatment options.  Therefore, final treatment plans cannot be decided.

## 2020-01-10 ENCOUNTER — Other Ambulatory Visit: Payer: Self-pay

## 2020-01-10 ENCOUNTER — Inpatient Hospital Stay: Payer: Medicare Other

## 2020-01-10 VITALS — BP 118/59 | HR 63 | Temp 98.0°F | Resp 18

## 2020-01-10 DIAGNOSIS — Z7689 Persons encountering health services in other specified circumstances: Secondary | ICD-10-CM | POA: Diagnosis not present

## 2020-01-10 DIAGNOSIS — J449 Chronic obstructive pulmonary disease, unspecified: Secondary | ICD-10-CM | POA: Diagnosis not present

## 2020-01-10 DIAGNOSIS — E785 Hyperlipidemia, unspecified: Secondary | ICD-10-CM | POA: Diagnosis not present

## 2020-01-10 DIAGNOSIS — C3411 Malignant neoplasm of upper lobe, right bronchus or lung: Secondary | ICD-10-CM | POA: Diagnosis not present

## 2020-01-10 DIAGNOSIS — I10 Essential (primary) hypertension: Secondary | ICD-10-CM | POA: Diagnosis not present

## 2020-01-10 DIAGNOSIS — G4733 Obstructive sleep apnea (adult) (pediatric): Secondary | ICD-10-CM | POA: Diagnosis not present

## 2020-01-10 DIAGNOSIS — I701 Atherosclerosis of renal artery: Secondary | ICD-10-CM | POA: Diagnosis not present

## 2020-01-10 DIAGNOSIS — I4892 Unspecified atrial flutter: Secondary | ICD-10-CM | POA: Diagnosis not present

## 2020-01-10 DIAGNOSIS — I252 Old myocardial infarction: Secondary | ICD-10-CM | POA: Diagnosis not present

## 2020-01-10 DIAGNOSIS — I700 Atherosclerosis of aorta: Secondary | ICD-10-CM | POA: Diagnosis not present

## 2020-01-10 DIAGNOSIS — Z5111 Encounter for antineoplastic chemotherapy: Secondary | ICD-10-CM | POA: Diagnosis not present

## 2020-01-10 DIAGNOSIS — G4730 Sleep apnea, unspecified: Secondary | ICD-10-CM | POA: Diagnosis not present

## 2020-01-10 DIAGNOSIS — Z87891 Personal history of nicotine dependence: Secondary | ICD-10-CM | POA: Diagnosis not present

## 2020-01-10 DIAGNOSIS — R110 Nausea: Secondary | ICD-10-CM | POA: Diagnosis not present

## 2020-01-10 DIAGNOSIS — C3491 Malignant neoplasm of unspecified part of right bronchus or lung: Secondary | ICD-10-CM

## 2020-01-10 DIAGNOSIS — I119 Hypertensive heart disease without heart failure: Secondary | ICD-10-CM | POA: Diagnosis not present

## 2020-01-10 DIAGNOSIS — I2510 Atherosclerotic heart disease of native coronary artery without angina pectoris: Secondary | ICD-10-CM | POA: Diagnosis not present

## 2020-01-10 LAB — CBC WITH DIFFERENTIAL (CANCER CENTER ONLY)
Abs Immature Granulocytes: 0.02 10*3/uL (ref 0.00–0.07)
Basophils Absolute: 0.1 10*3/uL (ref 0.0–0.1)
Basophils Relative: 1 %
Eosinophils Absolute: 0.4 10*3/uL (ref 0.0–0.5)
Eosinophils Relative: 5 %
HCT: 48.3 % (ref 39.0–52.0)
Hemoglobin: 16.2 g/dL (ref 13.0–17.0)
Immature Granulocytes: 0 %
Lymphocytes Relative: 22 %
Lymphs Abs: 1.8 10*3/uL (ref 0.7–4.0)
MCH: 32.9 pg (ref 26.0–34.0)
MCHC: 33.5 g/dL (ref 30.0–36.0)
MCV: 98.2 fL (ref 80.0–100.0)
Monocytes Absolute: 0.8 10*3/uL (ref 0.1–1.0)
Monocytes Relative: 10 %
Neutro Abs: 5.1 10*3/uL (ref 1.7–7.7)
Neutrophils Relative %: 62 %
Platelet Count: 167 10*3/uL (ref 150–400)
RBC: 4.92 MIL/uL (ref 4.22–5.81)
RDW: 13.9 % (ref 11.5–15.5)
WBC Count: 8.2 10*3/uL (ref 4.0–10.5)
nRBC: 0 % (ref 0.0–0.2)

## 2020-01-10 LAB — CMP (CANCER CENTER ONLY)
ALT: 25 U/L (ref 0–44)
AST: 24 U/L (ref 15–41)
Albumin: 3.8 g/dL (ref 3.5–5.0)
Alkaline Phosphatase: 89 U/L (ref 38–126)
Anion gap: 12 (ref 5–15)
BUN: 15 mg/dL (ref 8–23)
CO2: 24 mmol/L (ref 22–32)
Calcium: 9.6 mg/dL (ref 8.9–10.3)
Chloride: 102 mmol/L (ref 98–111)
Creatinine: 1.26 mg/dL — ABNORMAL HIGH (ref 0.61–1.24)
GFR, Est AFR Am: >60 mL/min (ref >60)
GFR, Estimated: 57 mL/min — ABNORMAL LOW (ref >60)
Glucose, Bld: 146 mg/dL — ABNORMAL HIGH (ref 70–99)
Potassium: 3.9 mmol/L (ref 3.5–5.1)
Sodium: 138 mmol/L (ref 135–145)
Total Bilirubin: 0.7 mg/dL (ref 0.3–1.2)
Total Protein: 7.2 g/dL (ref 6.5–8.1)

## 2020-01-10 MED ORDER — SODIUM CHLORIDE 0.9 % IV SOLN
459.5000 mg | Freq: Once | INTRAVENOUS | Status: AC
  Administered 2020-01-10: 460 mg via INTRAVENOUS
  Filled 2020-01-10: qty 46

## 2020-01-10 MED ORDER — SODIUM CHLORIDE 0.9 % IV SOLN
10.0000 mg | Freq: Once | INTRAVENOUS | Status: AC
  Administered 2020-01-10: 10 mg via INTRAVENOUS
  Filled 2020-01-10: qty 10

## 2020-01-10 MED ORDER — PALONOSETRON HCL INJECTION 0.25 MG/5ML
INTRAVENOUS | Status: AC
  Filled 2020-01-10: qty 5

## 2020-01-10 MED ORDER — SODIUM CHLORIDE 0.9 % IV SOLN
100.0000 mg/m2 | Freq: Once | INTRAVENOUS | Status: AC
  Administered 2020-01-10: 220 mg via INTRAVENOUS
  Filled 2020-01-10: qty 11

## 2020-01-10 MED ORDER — SODIUM CHLORIDE 0.9 % IV SOLN
Freq: Once | INTRAVENOUS | Status: AC
  Filled 2020-01-10: qty 250

## 2020-01-10 MED ORDER — SODIUM CHLORIDE 0.9 % IV SOLN
150.0000 mg | Freq: Once | INTRAVENOUS | Status: AC
  Administered 2020-01-10: 150 mg via INTRAVENOUS
  Filled 2020-01-10: qty 150

## 2020-01-10 MED ORDER — PALONOSETRON HCL INJECTION 0.25 MG/5ML
0.2500 mg | Freq: Once | INTRAVENOUS | Status: AC
  Administered 2020-01-10: 0.25 mg via INTRAVENOUS

## 2020-01-10 NOTE — Progress Notes (Signed)
Thoracic Location of Tumor / Histology: [facility] Lung  Patient presented for follow-up scans for observational respiratory viral infection.  MRI Brain 01/25/2020:   Navigational Bronchoscopy 12/27/2019  CT Chest 12/19/2019: Slight interval enlargement of the previously PET avid nodule of the posterior apical segment right upper lobe measuring 1.2 cm highly concerning for malignancy.  PET 3/82/5053: Hypermetabolic 1.0 cm apical segment right upper lobe nodule with SUV max of 5.0.  There was no hypermetabolic mediastinal, hilar, or axillary lymphadenopathy.  CT Chest 11/16/2019: 1.0 cm apical segment right upper lobe nodule increased from 0.4 cm on the previous scan performed a year before.  Cytology 12/27/2019   Biopsies of [facility] Lung 12/27/2019   Tobacco/Marijuana/Snuff/ETOH use: Former smoker  Past/Anticipated interventions by cardiothoracic surgery, if any:   Past/Anticipated interventions by medical oncology, if any:  Dr. Julien Nordmann 01/03/2020 -I discussed with the patient the option of surgical resection if he is surgical candidate for resection.  He was seen in the past by Dr. Kipp Brood and I will contact him for confirmation of possible surgery. -If the patient is not a good surgical candidate for resection we will proceed with a course of concurrent chemoradiation with chemotherapy in the form of carboplatin for AUC of 5 on day 1 and etoposide 100 mg/M2 on days 1, 2 and 3 every 3 weeks.  The patient is not a great candidate for treatment with cisplatin because of his renal insufficiency and other comorbidities. -If he is not a surgical candidate I will refer him to radiation oncology for consideration of concurrent radiotherapy. - I will complete the staging work-up by ordering MRI of the brain to rule out brain metastasis.    Signs/Symptoms  Weight changes, if any: No  Respiratory complaints, if any: No  Hemoptysis, if any: No  Pain issues, if any:  No  SAFETY ISSUES:  Prior  radiation? 1980, testicular cancer  Pacemaker/ICD?  No  Possible current pregnancy? N/a  Is the patient on methotrexate? No  Current Complaints / other details:  -History of testicular cancer, s/p surgery and radiation 1980

## 2020-01-10 NOTE — Progress Notes (Signed)
First day Carbo and Etoposide. Pt completed without complaints.

## 2020-01-10 NOTE — Patient Instructions (Signed)
[facility] Discharge Instructions for Patients Receiving Chemotherapy  Today you received the following chemotherapy agents Carboplatin, Etoposide.  To help prevent nausea and vomiting after your treatment, we encourage you to take your nausea medication DO NOT TAKE ZOFRAN FOR THREE DAYS AFTER TREATMENT.   If you develop nausea and vomiting that is not controlled by your nausea medication, call the clinic.   BELOW ARE SYMPTOMS THAT SHOULD BE REPORTED IMMEDIATELY:  *FEVER GREATER THAN 100.5 F  *CHILLS WITH OR WITHOUT FEVER  NAUSEA AND VOMITING THAT IS NOT CONTROLLED WITH YOUR NAUSEA MEDICATION  *UNUSUAL SHORTNESS OF BREATH  *UNUSUAL BRUISING OR BLEEDING  TENDERNESS IN MOUTH AND THROAT WITH OR WITHOUT PRESENCE OF ULCERS  *URINARY PROBLEMS  *BOWEL PROBLEMS  UNUSUAL RASH Items with * indicate a potential emergency and should be followed up as soon as possible.  Feel free to call the clinic should you have any questions or concerns. The clinic phone number is [phone].  Please show the [facility] at check-in to the Emergency Department and triage nurse.  Carboplatin injection What is this medicine? CARBOPLATIN (KAR boe pla tin) is a chemotherapy drug. It targets fast dividing cells, like cancer cells, and causes these cells to die. This medicine is used to treat ovarian cancer and many other cancers. This medicine may be used for other purposes; ask your health care provider or pharmacist if you have questions. COMMON BRAND NAME(S): Paraplatin What should I tell my health care provider before I take this medicine? They need to know if you have any of these conditions:  blood disorders  hearing problems  kidney disease  recent or ongoing radiation therapy  an unusual or allergic reaction to carboplatin, cisplatin, other chemotherapy, other medicines, foods, dyes, or preservatives  pregnant or trying to get pregnant  breast-feeding How should  I use this medicine? This drug is usually given as an infusion into a vein. It is administered in a hospital or clinic by a specially trained health care professional. Talk to your pediatrician regarding the use of this medicine in children. Special care may be needed. Overdosage: If you think you have taken too much of this medicine contact a poison control center or emergency room at once. NOTE: This medicine is only for you. Do not share this medicine with others. What if I miss a dose? It is important not to miss a dose. Call your doctor or health care professional if you are unable to keep an appointment. What may interact with this medicine?  medicines for seizures  medicines to increase blood counts like filgrastim, pegfilgrastim, sargramostim  some antibiotics like amikacin, gentamicin, neomycin, streptomycin, tobramycin  vaccines Talk to your doctor or health care professional before taking any of these medicines:  acetaminophen  aspirin  ibuprofen  ketoprofen  naproxen This list may not describe all possible interactions. Give your health care provider a list of all the medicines, herbs, non-prescription drugs, or dietary supplements you use. Also tell them if you smoke, drink alcohol, or use illegal drugs. Some items may interact with your medicine. What should I watch for while using this medicine? Your condition will be monitored carefully while you are receiving this medicine. You will need important blood work done while you are taking this medicine. This drug may make you feel generally unwell. This is not uncommon, as chemotherapy can affect healthy cells as well as cancer cells. Report any side effects. Continue your course of treatment even though you feel  ill unless your doctor tells you to stop. In some cases, you may be given additional medicines to help with side effects. Follow all directions for their use. Call your doctor or health care professional for  advice if you get a fever, chills or sore throat, or other symptoms of a cold or flu. Do not treat yourself. This drug decreases your body's ability to fight infections. Try to avoid being around people who are sick. This medicine may increase your risk to bruise or bleed. Call your doctor or health care professional if you notice any unusual bleeding. Be careful brushing and flossing your teeth or using a toothpick because you may get an infection or bleed more easily. If you have any dental work done, tell your dentist you are receiving this medicine. Avoid taking products that contain aspirin, acetaminophen, ibuprofen, naproxen, or ketoprofen unless instructed by your doctor. These medicines may hide a fever. Do not become pregnant while taking this medicine. Women should inform their doctor if they wish to become pregnant or think they might be pregnant. There is a potential for serious side effects to an unborn child. Talk to your health care professional or pharmacist for more information. Do not breast-feed an infant while taking this medicine. What side effects may I notice from receiving this medicine? Side effects that you should report to your doctor or health care professional as soon as possible:  allergic reactions like skin rash, itching or hives, swelling of the face, lips, or tongue  signs of infection - fever or chills, cough, sore throat, pain or difficulty passing urine  signs of decreased platelets or bleeding - bruising, pinpoint red spots on the skin, black, tarry stools, nosebleeds  signs of decreased red blood cells - unusually weak or tired, fainting spells, lightheadedness  breathing problems  changes in hearing  changes in vision  chest pain  high blood pressure  low blood counts - This drug may decrease the number of white blood cells, red blood cells and platelets. You may be at increased risk for infections and bleeding.  nausea and vomiting  pain,  swelling, redness or irritation at the injection site  pain, tingling, numbness in the hands or feet  problems with balance, talking, walking  trouble passing urine or change in the amount of urine Side effects that usually do not require medical attention (report to your doctor or health care professional if they continue or are bothersome):  hair loss  loss of appetite  metallic taste in the mouth or changes in taste This list may not describe all possible side effects. Call your doctor for medical advice about side effects. You may report side effects to FDA at 1-800-FDA-1088. Where should I keep my medicine? This drug is given in a hospital or clinic and will not be stored at home. NOTE: This sheet is a summary. It may not cover all possible information. If you have questions about this medicine, talk to your doctor, pharmacist, or health care provider.  2020 Elsevier/Gold Standard (2007-11-23 14:38:05) Etoposide, VP-16 injection What is this medicine? ETOPOSIDE, VP-16 (e toe POE side) is a chemotherapy drug. It is used to treat testicular cancer, lung cancer, and other cancers. This medicine may be used for other purposes; ask your health care provider or pharmacist if you have questions. COMMON BRAND NAME(S): Etopophos, Toposar, VePesid What should I tell my health care provider before I take this medicine? They need to know if you have any of these conditions:  infection  kidney disease  liver disease  low blood counts, like low white cell, platelet, or red cell counts  an unusual or allergic reaction to etoposide, other medicines, foods, dyes, or preservatives  pregnant or trying to get pregnant  breast-feeding How should I use this medicine? This medicine is for infusion into a vein. It is administered in a hospital or clinic by a specially trained health care professional. Talk to your pediatrician regarding the use of this medicine in children. Special care may  be needed. Overdosage: If you think you have taken too much of this medicine contact a poison control center or emergency room at once. NOTE: This medicine is only for you. Do not share this medicine with others. What if I miss a dose? It is important not to miss your dose. Call your doctor or health care professional if you are unable to keep an appointment. What may interact with this medicine? This medicine may interact with the following medications:  warfarin This list may not describe all possible interactions. Give your health care provider a list of all the medicines, herbs, non-prescription drugs, or dietary supplements you use. Also tell them if you smoke, drink alcohol, or use illegal drugs. Some items may interact with your medicine. What should I watch for while using this medicine? Visit your doctor for checks on your progress. This drug may make you feel generally unwell. This is not uncommon, as chemotherapy can affect healthy cells as well as cancer cells. Report any side effects. Continue your course of treatment even though you feel ill unless your doctor tells you to stop. In some cases, you may be given additional medicines to help with side effects. Follow all directions for their use. Call your doctor or health care professional for advice if you get a fever, chills or sore throat, or other symptoms of a cold or flu. Do not treat yourself. This drug decreases your body's ability to fight infections. Try to avoid being around people who are sick. This medicine may increase your risk to bruise or bleed. Call your doctor or health care professional if you notice any unusual bleeding. Talk to your doctor about your risk of cancer. You may be more at risk for certain types of cancers if you take this medicine. Do not become pregnant while taking this medicine or for at least 6 months after stopping it. Women should inform their doctor if they wish to become pregnant or think they  might be pregnant. Women of child-bearing potential will need to have a negative pregnancy test before starting this medicine. There is a potential for serious side effects to an unborn child. Talk to your health care professional or pharmacist for more information. Do not breast-feed an infant while taking this medicine. Men must use a latex condom during sexual contact with a woman while taking this medicine and for at least 4 months after stopping it. A latex condom is needed even if you have had a vasectomy. Contact your doctor right away if your partner becomes pregnant. Do not donate sperm while taking this medicine and for at least 4 months after you stop taking this medicine. Men should inform their doctors if they wish to father a child. This medicine may lower sperm counts. What side effects may I notice from receiving this medicine? Side effects that you should report to your doctor or health care professional as soon as possible:  allergic reactions like skin rash, itching or hives, swelling of  the face, lips, or tongue  low blood counts - this medicine may decrease the number of white blood cells, red blood cells, and platelets. You may be at increased risk for infections and bleeding  nausea, vomiting  redness, blistering, peeling or loosening of the skin, including inside the mouth  signs and symptoms of infection like fever; chills; cough; sore throat; pain or trouble passing urine  signs and symptoms of low red blood cells or anemia such as unusually weak or tired; feeling faint or lightheaded; falls; breathing problems  unusual bruising or bleeding Side effects that usually do not require medical attention (report to your doctor or health care professional if they continue or are bothersome):  changes in taste  diarrhea  hair loss  loss of appetite  mouth sores This list may not describe all possible side effects. Call your doctor for medical advice about side effects.  You may report side effects to FDA at 1-800-FDA-1088. Where should I keep my medicine? This drug is given in a hospital or clinic and will not be stored at home. NOTE: This sheet is a summary. It may not cover all possible information. If you have questions about this medicine, talk to your doctor, pharmacist, or health care provider.  2020 Elsevier/Gold Standard (2018-10-13 16:57:15)

## 2020-01-11 ENCOUNTER — Other Ambulatory Visit: Payer: Medicare Other

## 2020-01-11 ENCOUNTER — Ambulatory Visit: Payer: Medicare Other | Admitting: Pulmonary Disease

## 2020-01-11 ENCOUNTER — Other Ambulatory Visit: Payer: Self-pay

## 2020-01-11 ENCOUNTER — Inpatient Hospital Stay: Payer: Medicare Other

## 2020-01-11 ENCOUNTER — Encounter: Payer: Self-pay | Admitting: Radiation Oncology

## 2020-01-11 ENCOUNTER — Ambulatory Visit
Admission: RE | Admit: 2020-01-11 | Discharge: 2020-01-11 | Disposition: A | Discharge status: Home / Self Care | Payer: Medicare Other | Source: Ambulatory Visit | Attending: Radiation Oncology | Admitting: Radiation Oncology

## 2020-01-11 ENCOUNTER — Encounter: Payer: Self-pay | Admitting: Internal Medicine

## 2020-01-11 VITALS — Ht 72.0 in | Wt 215.0 lb

## 2020-01-11 VITALS — BP 120/54 | HR 67 | Temp 98.9°F | Resp 18

## 2020-01-11 DIAGNOSIS — E785 Hyperlipidemia, unspecified: Secondary | ICD-10-CM | POA: Diagnosis not present

## 2020-01-11 DIAGNOSIS — Z5111 Encounter for antineoplastic chemotherapy: Secondary | ICD-10-CM | POA: Diagnosis not present

## 2020-01-11 DIAGNOSIS — C3491 Malignant neoplasm of unspecified part of right bronchus or lung: Secondary | ICD-10-CM

## 2020-01-11 DIAGNOSIS — C3411 Malignant neoplasm of upper lobe, right bronchus or lung: Secondary | ICD-10-CM

## 2020-01-11 DIAGNOSIS — I701 Atherosclerosis of renal artery: Secondary | ICD-10-CM | POA: Diagnosis not present

## 2020-01-11 DIAGNOSIS — G4730 Sleep apnea, unspecified: Secondary | ICD-10-CM | POA: Diagnosis not present

## 2020-01-11 DIAGNOSIS — I10 Essential (primary) hypertension: Secondary | ICD-10-CM | POA: Diagnosis not present

## 2020-01-11 DIAGNOSIS — I700 Atherosclerosis of aorta: Secondary | ICD-10-CM | POA: Diagnosis not present

## 2020-01-11 DIAGNOSIS — I252 Old myocardial infarction: Secondary | ICD-10-CM | POA: Diagnosis not present

## 2020-01-11 DIAGNOSIS — G4733 Obstructive sleep apnea (adult) (pediatric): Secondary | ICD-10-CM | POA: Diagnosis not present

## 2020-01-11 DIAGNOSIS — I119 Hypertensive heart disease without heart failure: Secondary | ICD-10-CM | POA: Diagnosis not present

## 2020-01-11 DIAGNOSIS — J449 Chronic obstructive pulmonary disease, unspecified: Secondary | ICD-10-CM | POA: Diagnosis not present

## 2020-01-11 DIAGNOSIS — Z7689 Persons encountering health services in other specified circumstances: Secondary | ICD-10-CM | POA: Diagnosis not present

## 2020-01-11 DIAGNOSIS — R110 Nausea: Secondary | ICD-10-CM | POA: Diagnosis not present

## 2020-01-11 DIAGNOSIS — Z923 Personal history of irradiation: Secondary | ICD-10-CM | POA: Diagnosis not present

## 2020-01-11 DIAGNOSIS — I2510 Atherosclerotic heart disease of native coronary artery without angina pectoris: Secondary | ICD-10-CM | POA: Diagnosis not present

## 2020-01-11 DIAGNOSIS — I4892 Unspecified atrial flutter: Secondary | ICD-10-CM | POA: Diagnosis not present

## 2020-01-11 DIAGNOSIS — Z87891 Personal history of nicotine dependence: Secondary | ICD-10-CM | POA: Diagnosis not present

## 2020-01-11 DIAGNOSIS — Z8547 Personal history of malignant neoplasm of testis: Secondary | ICD-10-CM | POA: Diagnosis not present

## 2020-01-11 MED ORDER — SODIUM CHLORIDE 0.9 % IV SOLN
100.0000 mg/m2 | Freq: Once | INTRAVENOUS | Status: AC
  Administered 2020-01-11: 220 mg via INTRAVENOUS
  Filled 2020-01-11: qty 11

## 2020-01-11 MED ORDER — SODIUM CHLORIDE 0.9 % IV SOLN
10.0000 mg | Freq: Once | INTRAVENOUS | Status: AC
  Administered 2020-01-11: 10 mg via INTRAVENOUS
  Filled 2020-01-11: qty 10

## 2020-01-11 MED ORDER — SODIUM CHLORIDE 0.9 % IV SOLN
Freq: Once | INTRAVENOUS | Status: AC
  Filled 2020-01-11: qty 250

## 2020-01-11 NOTE — Progress Notes (Signed)
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  Pt gave me consent to apply is his behalf so I completed the online application for the [facility] but he was denied because he is over the income requirement and he's also over the income requirement for the [employer].  He has my card for any questions or concerns he may have in the future.

## 2020-01-11 NOTE — Progress Notes (Signed)
Radiation Oncology         [phone] ________________________________  Initial Outpatient Consultation - Conducted via telephone due to current COVID-19 concerns for limiting patient exposure  I spoke with the patient to conduct this consult visit via telephone to spare the patient unnecessary potential exposure in the healthcare setting during the current COVID-19 pandemic. The patient was notified in advance and was offered a [place] meeting to allow for face to face communication but unfortunately reported that they did not have the appropriate resources/technology to support such a visit and instead preferred to proceed with a telephone consult.    Name: [name]        MRN: [id number]  Date of Service: 01/11/2020 DOB: [birth year]  LX:[id number], Einar Pheasant, DO  [name], MD     REFERRING PHYSICIAN: [name], MD   DIAGNOSIS: The encounter diagnosis was Small cell lung cancer, right [facility]).   HISTORY OF PRESENT ILLNESS: [name] is a 74 y.o. male seen at the request of Dr. Julien Nordmann for an early, limited Stage small cell carcinoma of the right upper lobe. 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 The patient was noted to have a viral respiratory infection in 2019 and has been followed by pulmonary since. Dr. Vaughan Browner recommended repeat imaging of the chest with CT on 11/16/19 that revealed a 1 cm lesion in the apex of the RUL that had previously been 4 mm. A PET scan on 11/28/19 revealed hypermetabolism in the lesion with an SUV of 5 and no metastatic disease was noted. Another CT on 12/19/19 measured the lesion at 1.2 cm, and he underwent navigational bronchoscopy on 12/27/19 and final pathology revealed a small cell carcinoma. He has met with Dr. Julien Nordmann and he has recommended consideration of surgical resection versus chemoRT, versus SBRT and subsequent chemotherapy. He's contacted  today by phone to discuss options of therapy. He is scheduled for an MRI brain on 01/25/20. He's contacted by phone to discuss treatment options.     PREVIOUS RADIATION THERAPY: Yes   1980: The patient's left testicle and regional nodes were treated over about 4 weeks in [place], [place] at [facility].   PAST MEDICAL HISTORY:  Past Medical History:  Diagnosis Date  . Atypical atrial flutter ([place])   . Cancer [facility])    Testicular- surgery   . COPD (chronic obstructive pulmonary disease) ([place])   . Coronary artery disease   . Diastolic dysfunction, left ventricle 05/2018   Dr. Rayann Heman- Cardiologist  . Dysrhythmia    chronic AFib  . History of kidney stones    passed  . HLD (hyperlipidemia)   . HTN (hypertension)   . Myocardial infarction ([place])    x 2 maybe 1 more  . OSA (obstructive sleep apnea)    uses CPAP  . Peripheral artery disease ([place])    pseudoaneurysm post afib ablation at [facility] 2011, s/p bilateral iliac stents  . Persistent atrial fibrillation ([place])   . Pre-diabetes   . Renal artery stenosis ([facility])    right renal artery PTA and stenting  . S/P coronary artery bypass graft x 7 11/16/96       PAST SURGICAL HISTORY: Past Surgical History:  Procedure Laterality Date  . 2-D echocardiogram  03/25/2010   Normal left ventricular function. Mild MR, TR, trivial AR  . ATRIAL FIBRILLATION ABLATION  03/27/2010, 12/31/2010   [facility], Dr. Nadeen Landau  .  BACK SURGERY  2004, 2007   Laminectomy, Discedectomy x 2  . BRONCHIAL BIOPSY  12/27/2019   Procedure: BRONCHIAL BIOPSIES;  Surgeon: Garner Nash, DO;  Location: [facility];  Service: Pulmonary;;  . BRONCHIAL BRUSHINGS  12/27/2019   Procedure: BRONCHIAL BRUSHINGS;  Surgeon: Garner Nash, DO;  Location: [facility];  Service: Pulmonary;;  . BRONCHIAL NEEDLE ASPIRATION BIOPSY  12/27/2019   Procedure: BRONCHIAL NEEDLE ASPIRATION BIOPSIES;  Surgeon: Garner Nash, DO;  Location: [facility];  Service: Pulmonary;;  . cardiac  stress test  11/21/2009   Exercise capacity 5 METS. No significant ischemia demonstrated  . CARDIOVERSION N/A 03/15/2015   Procedure: CARDIOVERSION;  Surgeon: Lorretta Harp, MD;  Location: [facility] ENDOSCOPY;  Service: Cardiovascular;  Laterality: N/A;  . CARDIOVERSION N/A 07/10/2017   Procedure: CARDIOVERSION;  Surgeon: Pixie Casino, MD;  Location: [facility] ENDOSCOPY;  Service: Cardiovascular;  Laterality: N/A;  . CARDIOVERSION N/A 03/23/2018   Procedure: CARDIOVERSION;  Surgeon: Sanda Klein, MD;  Location: [facility] ENDOSCOPY;  Service: Cardiovascular;  Laterality: N/A;  . CORONARY ARTERY BYPASS GRAFT  1998  . ELECTROMAGNETIC NAVIGATION BROCHOSCOPY  12/27/2019   Procedure: NAVIGATION BRONCHOSCOPY;  Surgeon: Garner Nash, DO;  Location: [facility] ENDOSCOPY;  Service: Pulmonary;;  . FIDUCIAL MARKER PLACEMENT  12/27/2019   Procedure: FIDUCIAL MARKER PLACEMENT;  Surgeon: Garner Nash, DO;  Location: [facility] ENDOSCOPY;  Service: Pulmonary;;  . ORCHIECTOMY  1981   for cancer  . TOOTH EXTRACTION  05/27/2013   tooth extraction with bone graft- several -for Implants  . VIDEO BRONCHOSCOPY WITH ENDOBRONCHIAL NAVIGATION N/A 12/27/2019   Procedure: VIDEO BRONCHOSCOPY;  Surgeon: Garner Nash, DO;  Location: [facility];  Service: Pulmonary;  Laterality: N/A;     FAMILY HISTORY:  Family History  Problem Relation Age of Onset  . Cancer Mother   . Cancer Father   . Other Brother        NO MEDICAL PROBLEMS  . Other Son        NO MEDICAL PROBLEMS  . Leukemia Daughter 6       Recover and has no other problems     SOCIAL HISTORY:  reports that he quit smoking about 25 years ago. His smoking use included cigarettes and cigars. He has a 45.00 pack-year smoking history. He has never used smokeless tobacco. He reports previous alcohol use. He reports that he does not use drugs. The patient is divorced and lives in [place].    ALLERGIES: Patient has no known allergies.   MEDICATIONS:  Current Outpatient  Medications  Medication Sig Dispense Refill  . acetaminophen (TYLENOL) 500 MG tablet Take 1,000 mg every 6 (six) hours as needed by mouth (for pain.).    [name] atorvastatin (LIPITOR) 10 MG tablet TAKE 1 TABLET(10 MG) BY MOUTH DAILY (Patient taking differently: Take 10 mg by mouth every evening. TAKE 1 TABLET(10 MG) BY MOUTH DAILY) 90 tablet 2  . budesonide-formoterol (SYMBICORT) 160-4.5 MCG/ACT inhaler Inhale 2 puffs into the lungs 2 (two) times daily. 10.2 g 5  . cetirizine (ZYRTEC) 10 MG tablet Take 10 mg by mouth daily.    [name] dronedarone (MULTAQ) 400 MG tablet TAKE 1 TABLET BY MOUTH TWICE DAILY WITH MEAL (Patient taking differently: Take 400 mg by mouth in the morning and at bedtime. TAKE 1 TABLET BY MOUTH TWICE DAILY WITH MEAL) 180 tablet 1  . empagliflozin (JARDIANCE) 10 MG TABS tablet Take 10 mg by mouth daily. Schedule appt with PCP 90 tablet 2  . Ferrous Sulfate (  SLOW FE) 142 (45 Fe) MG TBCR Take 45 mg by mouth at bedtime.     . furosemide (LASIX) 40 MG tablet TAKE 1 TABLET BY MOUTH AS NEEDED (Patient taking differently: Take 40 mg by mouth daily as needed for fluid. ) 30 tablet 3  . gabapentin (NEURONTIN) 300 MG capsule Take 2 capsules (600 mg total) by mouth at bedtime. 180 capsule 2  . ketotifen (ZADITOR) 0.025 % ophthalmic solution Place 1 drop into both eyes 2 (two) times daily as needed (allergies).    [name] lisinopril-hydrochlorothiazide (ZESTORETIC) 10-12.5 MG tablet TAKE 1 TABLET BY MOUTH DAILY (Patient taking differently: Take 1 tablet by mouth every evening. ) 90 tablet 1  . metoprolol succinate (TOPROL-XL) 100 MG 24 hr tablet TAKE 1 TABLET(100 MG) BY MOUTH DAILY (Patient taking differently: Take 100 mg by mouth every evening. TAKE 1 TABLET(100 MG) BY MOUTH DAILY) 90 tablet 3  . Multiple Vitamin (MULTIVITAMIN WITH MINERALS) TABS tablet Take 1 tablet by mouth daily. In the morning.    . niacin (NIASPAN) 1000 MG CR tablet TAKE 1 TABLET(1000 MG) BY MOUTH AT BEDTIME (Patient taking differently:  Take 1,000 mg by mouth at bedtime. ) 90 tablet 3  . nicotine polacrilex (COMMIT) 2 MG lozenge Take 2 mg by mouth as needed for smoking cessation.    . NON FORMULARY CPAP at night    . prochlorperazine (COMPAZINE) 10 MG tablet Take 1 tablet (10 mg total) by mouth every 6 (six) hours as needed for nausea or vomiting. 30 tablet 0  . traMADol (ULTRAM) 50 MG tablet TAKE 1 TABLET BY MOUTH EVERY 12 HOURS AS NEEDED (Patient taking differently: Take 50 mg by mouth 2 (two) times daily as needed (pain after golf). ) 30 tablet 0  . traZODone (DESYREL) 50 MG tablet TAKE 1/2 TO 1 TABLET BY MOUTH AT BEDTIME AS NEEDED FOR SLEEP (Patient taking differently: Take 50 mg by mouth at bedtime. ) 90 tablet 3  . VENTOLIN HFA 108 (90 Base) MCG/ACT inhaler INHALE 2 PUFFS INTO THE LUNGS EVERY 6 HOURS AS NEEDED FOR WHEEZING OR SHORTNESS OF BREATH (Patient taking differently: Inhale 2 puffs into the lungs every 6 (six) hours as needed (wheezing/shortness of breath.). ) 18 g 1  . XARELTO 20 MG TABS tablet TAKE 1 TABLET BY MOUTH EVERY DAY WITH SUPPER (Patient taking differently: Take 20 mg by mouth every evening. ) 90 tablet 1   No current facility-administered medications for this encounter.     REVIEW OF SYSTEMS: On review of systems, the patient reports that he is doing well overall. He denies any chest pain, shortness of breath, cough, fevers, chills, night sweats, unintended weight changes. He denies any bowel or bladder disturbances, and denies abdominal pain, nausea or vomiting. He denies any new musculoskeletal or joint aches or pains. A complete review of systems is obtained and is otherwise negative.     PHYSICAL EXAM:  Wt Readings from Last 3 Encounters:  01/03/20 215 lb 6.4 oz (97.7 kg)  12/27/19 215 lb (97.5 kg)  12/15/19 214 lb (97.1 kg)      ECOG = 0  0 - Asymptomatic (Fully active, able to carry on all predisease activities without restriction)  1 - Symptomatic but completely ambulatory (Restricted in  physically strenuous activity but ambulatory and able to carry out work of a light or sedentary nature. For example, light housework, office work)  2 - Symptomatic, <50% in bed during the day (Ambulatory and capable of all self care but  unable to carry out any work activities. Up and about more than 50% of waking hours)  3 - Symptomatic, >50% in bed, but not bedbound (Capable of only limited self-care, confined to bed or chair 50% or more of waking hours)  4 - Bedbound (Completely disabled. Cannot carry on any self-care. Totally confined to bed or chair)  5 - Death   Eustace Pen MM, Creech RH, Tormey DC, et al. [id number]). "Toxicity and response criteria of the [facility]". [place] Oncol. 5 (6): 649-55    LABORATORY DATA:  Lab Results  Component Value Date   WBC 8.2 01/10/2020   HGB 16.2 01/10/2020   HCT 48.3 01/10/2020   MCV 98.2 01/10/2020   PLT 167 01/10/2020   Lab Results  Component Value Date   NA 138 01/10/2020   K 3.9 01/10/2020   CL 102 01/10/2020   CO2 24 01/10/2020   Lab Results  Component Value Date   ALT 25 01/10/2020   AST 24 01/10/2020   ALKPHOS 89 01/10/2020   BILITOT 0.7 01/10/2020      RADIOGRAPHY: DG CHEST PORT 1 VIEW  Result Date: 12/27/2019 CLINICAL DATA:  Post right upper lobe bronchoscopy EXAM: PORTABLE CHEST 1 VIEW COMPARISON:  09/15/2018 FINDINGS: Cardiomegaly, prior CABG. Increased airspace opacity medially in the right upper lobe may reflect changes of bronchoscopy. No pneumothorax. No effusions or overt edema. No acute bony abnormality. IMPRESSION: Medial right upper lobe airspace opacity may reflect changes of bronchoscopy. No pneumothorax. Electronically Signed   By: Rolm Baptise M.D.   On: 12/27/2019 10:58   CT Super D Chest Wo Contrast  Result Date: 12/19/2019 CLINICAL DATA:  Lung nodule, bronchoscopy planning EXAM: CT CHEST WITHOUT CONTRAST TECHNIQUE: Multidetector CT imaging of the chest was performed using thin slice  collimation for electromagnetic bronchoscopy planning purposes, without intravenous contrast. COMPARISON:  PET-CT, 11/28/2019, CT chest, 11/16/2019 FINDINGS: Cardiovascular: Aortic atherosclerosis. Normal heart size. Extensive 3 vessel coronary artery calcifications and/or stents. No pericardial effusion. Mediastinum/Nodes: No enlarged mediastinal, hilar, or axillary lymph nodes. Thyroid gland, trachea, and esophagus demonstrate no significant findings. Lungs/Pleura: Slight interval enlargement of a previously PET avid nodule of the posterior apical segment right upper lobe, measuring 1.2 cm (series 3, image 40). No pleural effusion or pneumothorax. Upper Abdomen: No acute abnormality.  Cholelithiasis. Musculoskeletal: No chest wall mass or suspicious bone lesions identified. IMPRESSION: 1. Slight interval enlargement of a previously PET avid nodule of the posterior apical segment right upper lobe, measuring 1.2 cm, which remains highly concerning for malignancy. 2.  Coronary artery disease.  Aortic Atherosclerosis (ICD10-I70.0). Electronically Signed   By: Eddie Candle M.D.   On: 12/19/2019 21:13   DG C-ARM BRONCHOSCOPY  Result Date: 12/27/2019 C-ARM BRONCHOSCOPY: Fluoroscopy was utilized by the requesting physician.  No radiographic interpretation.       IMPRESSION/PLAN: 1. Limited Stage Small Cell Carcinoma of the apical RUL. Dr. Lisbeth Renshaw discusses the pathology findings and reviews the nature of small cell lung cancer.  He discusses that most patients do not present with this early of disease, and because of this he would be a candidate for either chemoradiation versus stereotactic body radiotherapy (SBRT) and chemotherapy.  The patient is interested in stereotactic options.  We discussed the risks, benefits, short, and long term effects of radiotherapy, and the patient is interested in proceeding. Dr. Lisbeth Renshaw discusses the delivery and logistics of radiotherapy and anticipates a course of 3-5 fractions of  radiotherapy. We will see him next Tuesday for  simulation and anticipate treating him the week of 01/23/2020. 2. PCI.  We discussed the risks of small cell cancer patients have much higher rates of metastatic brain disease, and for this reason guidelines recommend offering prophylactic cranial irradiation.  We will follow-up with his upcoming MRI scan for restaging purposes as well, and revisit options of prophylactic cranial irradiation following completion of systemic therapy, he is aware of the long-term risks of whole brain radiation, but we will pursue decision making several months from now as to how he would like to proceed.   Given current concerns for patient exposure during the COVID-19 pandemic, this encounter was conducted via telephone.  The patient has provided two factor identification and has given verbal consent for this type of encounter and has been advised to only accept a meeting of this type in a secure network environment. The time spent during this encounter was 45 minutes including preparation, discussion, and coordination of the patient's care. The attendants for this meeting include Blenda Nicely, RN, Dr. Lisbeth Renshaw, Hayden Pedro  and [name].  During the encounter,  Blenda Nicely, RN, Dr. Lisbeth Renshaw, and Hayden Pedro were located at [facility] Radiation Oncology Department.  Reginia Forts Beeck was located at home.    The above documentation reflects my direct findings during this shared patient visit. Please see the separate note by Dr. Lisbeth Renshaw on this date for the remainder of the patient's plan of care.    Carola Rhine, PAC

## 2020-01-11 NOTE — Patient Instructions (Signed)
[facility] Discharge Instructions for Patients Receiving Chemotherapy  Today you received the following chemotherapy agents: etoposide  To help prevent nausea and vomiting after your treatment, we encourage you to take your nausea medication as directed.   If you develop nausea and vomiting that is not controlled by your nausea medication, call the clinic.   BELOW ARE SYMPTOMS THAT SHOULD BE REPORTED IMMEDIATELY:  *FEVER GREATER THAN 100.5 F  *CHILLS WITH OR WITHOUT FEVER  NAUSEA AND VOMITING THAT IS NOT CONTROLLED WITH YOUR NAUSEA MEDICATION  *UNUSUAL SHORTNESS OF BREATH  *UNUSUAL BRUISING OR BLEEDING  TENDERNESS IN MOUTH AND THROAT WITH OR WITHOUT PRESENCE OF ULCERS  *URINARY PROBLEMS  *BOWEL PROBLEMS  UNUSUAL RASH Items with * indicate a potential emergency and should be followed up as soon as possible.  Feel free to call the clinic should you have any questions or concerns. The clinic phone number is [phone].  Please show the CHEMO ALERT CARD at check-in to the Emergency Department and triage nurse.   

## 2020-01-12 ENCOUNTER — Encounter: Payer: Self-pay | Admitting: General Practice

## 2020-01-12 ENCOUNTER — Other Ambulatory Visit: Payer: Self-pay

## 2020-01-12 ENCOUNTER — Inpatient Hospital Stay: Payer: Medicare Other

## 2020-01-12 VITALS — BP 111/60 | HR 66 | Temp 98.7°F | Resp 18

## 2020-01-12 DIAGNOSIS — I701 Atherosclerosis of renal artery: Secondary | ICD-10-CM | POA: Diagnosis not present

## 2020-01-12 DIAGNOSIS — Z7689 Persons encountering health services in other specified circumstances: Secondary | ICD-10-CM | POA: Diagnosis not present

## 2020-01-12 DIAGNOSIS — J449 Chronic obstructive pulmonary disease, unspecified: Secondary | ICD-10-CM | POA: Diagnosis not present

## 2020-01-12 DIAGNOSIS — I2510 Atherosclerotic heart disease of native coronary artery without angina pectoris: Secondary | ICD-10-CM | POA: Diagnosis not present

## 2020-01-12 DIAGNOSIS — G4730 Sleep apnea, unspecified: Secondary | ICD-10-CM | POA: Diagnosis not present

## 2020-01-12 DIAGNOSIS — C3411 Malignant neoplasm of upper lobe, right bronchus or lung: Secondary | ICD-10-CM

## 2020-01-12 DIAGNOSIS — I700 Atherosclerosis of aorta: Secondary | ICD-10-CM | POA: Diagnosis not present

## 2020-01-12 DIAGNOSIS — Z5111 Encounter for antineoplastic chemotherapy: Secondary | ICD-10-CM | POA: Diagnosis not present

## 2020-01-12 DIAGNOSIS — R110 Nausea: Secondary | ICD-10-CM | POA: Diagnosis not present

## 2020-01-12 DIAGNOSIS — I4892 Unspecified atrial flutter: Secondary | ICD-10-CM | POA: Diagnosis not present

## 2020-01-12 DIAGNOSIS — E785 Hyperlipidemia, unspecified: Secondary | ICD-10-CM | POA: Diagnosis not present

## 2020-01-12 DIAGNOSIS — Z87891 Personal history of nicotine dependence: Secondary | ICD-10-CM | POA: Diagnosis not present

## 2020-01-12 DIAGNOSIS — I252 Old myocardial infarction: Secondary | ICD-10-CM | POA: Diagnosis not present

## 2020-01-12 DIAGNOSIS — I10 Essential (primary) hypertension: Secondary | ICD-10-CM | POA: Diagnosis not present

## 2020-01-12 DIAGNOSIS — I119 Hypertensive heart disease without heart failure: Secondary | ICD-10-CM | POA: Diagnosis not present

## 2020-01-12 DIAGNOSIS — G4733 Obstructive sleep apnea (adult) (pediatric): Secondary | ICD-10-CM | POA: Diagnosis not present

## 2020-01-12 MED ORDER — SODIUM CHLORIDE 0.9 % IV SOLN
100.0000 mg/m2 | Freq: Once | INTRAVENOUS | Status: AC
  Administered 2020-01-12: 220 mg via INTRAVENOUS
  Filled 2020-01-12: qty 11

## 2020-01-12 MED ORDER — SODIUM CHLORIDE 0.9 % IV SOLN
10.0000 mg | Freq: Once | INTRAVENOUS | Status: AC
  Administered 2020-01-12: 10 mg via INTRAVENOUS
  Filled 2020-01-12: qty 10

## 2020-01-12 MED ORDER — SODIUM CHLORIDE 0.9 % IV SOLN
Freq: Once | INTRAVENOUS | Status: AC
  Filled 2020-01-12: qty 250

## 2020-01-12 NOTE — Progress Notes (Signed)
[name] Initial Psychosocial Assessment Clinical Social Work  Clinical Social Work contacted by phone to assess psychosocial, emotional, mental health, and spiritual needs of the patient.   Barriers to care/review of distress screen:  - Transportation:  Do you anticipate any problems getting to appointments?  Do you have someone who can help run errands for you if you need it?  No concerns - has someone who can drive him if needed, also plans to drive himself.   - Help at home:  What is your living situation (alone, family, other)?  If you are physically unable to care for yourself, who would you call on to help you?  Lives by himself, has someone  - Support system:  What does your support system look like?  Who would you call on if you needed some kind of practical help?  What if you needed someone to talk to for emotional support?  Relies on family and friends, "many people who say they will do anything I need."  Will try to stay as active as he can.   - Finances:  Are you concerned about finances.  Considering returning to work?  If not, applying for disability?  Retired from [employer].  No financial concerns.    What is your understanding of where you are with your cancer? Its cause?  Your treatment plan and what happens next?   74 yo male newly diagnosed w lung cancer, discovered during annual CT scan.  Scan picked up nodule that had increased in size. Is reasonably accepting of diagnosis and treatment.  Aware he has single nodule, no evidence of spread.  CSW and patient discussed common feeling and emotions when being diagnosed with cancer, and the importance of support during treatment. CSW informed patient of the support team and support services at [facility]. CSW encouraged patient to call with any questions or concerns.  What are your worries for the future as you begin treatment for cancer?  "Taking it a week at a time to get through treatments."  Now that treatments have started, he is more  at ease.  Tolerated first infusions well, minimal side effects.    What are your hopes and priorities during your treatment? What is important to you? What are your goals for your care?  Would like to go on vacation w family for several days June 6 - 9.  Would really like to go on this trip.  He appreciates information and likes to know as much as possible about his disease and treatments.     CSW Summary:  Patient and family psychosocial functioning including strengths, limitations, and coping skills: 74 year old newly diagnosed w lung cancer, found on surveillance scan.  Former smoker, quit cigarettes a while ago, continued to smoke cigars until day of diagnosis.  Has good support from friends, family.  Wants to remain as active as possible, confident he will tolerate treatment well.    Identifications of barriers to care:  None noted  Availability of community resources:  [facility] [place] for [facility] [place], Patient and [place] Worker follow up needed: No.   Edwyna Shell, [place] Social Worker Phone:  [phone] Cell:  [phone]

## 2020-01-12 NOTE — Patient Instructions (Signed)
[facility] Discharge Instructions for Patients Receiving Chemotherapy  Today you received the following chemotherapy agents: Etoposide (VEPESID).  To help prevent nausea and vomiting after your treatment, we encourage you to take your nausea medication as prescribed.   If you develop nausea and vomiting that is not controlled by your nausea medication, call the clinic.   BELOW ARE SYMPTOMS THAT SHOULD BE REPORTED IMMEDIATELY:  *FEVER GREATER THAN 100.5 F  *CHILLS WITH OR WITHOUT FEVER  NAUSEA AND VOMITING THAT IS NOT CONTROLLED WITH YOUR NAUSEA MEDICATION  *UNUSUAL SHORTNESS OF BREATH  *UNUSUAL BRUISING OR BLEEDING  TENDERNESS IN MOUTH AND THROAT WITH OR WITHOUT PRESENCE OF ULCERS  *URINARY PROBLEMS  *BOWEL PROBLEMS  UNUSUAL RASH Items with * indicate a potential emergency and should be followed up as soon as possible.  Feel free to call the clinic should you have any questions or concerns. The clinic phone number is [phone].  Please show the CHEMO ALERT CARD at check-in to the Emergency Department and triage nurse.   

## 2020-01-13 ENCOUNTER — Telehealth: Payer: Self-pay | Admitting: *Deleted

## 2020-01-13 ENCOUNTER — Ambulatory Visit: Payer: Medicare Other

## 2020-01-13 NOTE — Telephone Encounter (Signed)
TCT patient and spoke with him. Informed pt that his appt for injection tomorrow has been cancelled per pharmacy.  Pt voiced understanding. No questions or concerns.

## 2020-01-14 ENCOUNTER — Inpatient Hospital Stay: Payer: Medicare Other

## 2020-01-16 NOTE — Progress Notes (Signed)
[facility] OFFICE PROGRESS NOTE  Luetta Nutting, DO [address]  [address] [place] [address]  DIAGNOSIS: limited stage, stage Ia (T1b, N0, M0) small cell lung cancer presented with right upper lobe pulmonary nodule that is hypermetabolic and biopsy proven to be consistent with small cell carcinoma diagnosed in April 2021.  PRIOR THERAPY: None  CURRENT THERAPY: Concurrent chemoradiation with carboplatin for an AUC of 5 on days 1 and etoposide 100 mg per metered squared on days 1, 2, and 3 IV every 3 weeks.  First dose received on 01/10/2020.   INTERVAL HISTORY: [name] Picking 74 y.o. male returns to the clinic today for a follow-up visit.  The patient was recently diagnosed with limited stage small cell lung cancer.  He received his first cycle of chemotherapy last week and he tolerated it well without any concerning complaints except for fatigue and gas. He has tried taking Gasx which helps. He also has slept a lot a few days following treatment. Today, he denies any fevers, chills, weight loss, or night sweats. He denies any changes with his breathing and baseline dyspnea on exertion. He uses his Symbicort as needed which improves his symptoms. Denies chest pain or hemoptysis. He denies any  vomiting, diarrhea, or constipation. He has had some mild nausea and had to take his anti-emetic 3 times which resolved his nausea. He denies any headache or visual changes.  He is scheduled to have his staging brain MRI performed on 01/25/20.  The patient is scheduled to have his CT simulation performed today. He is here today for evaluation and a 1 week follow-up visit after completing his first cycle of chemotherapy.  MEDICAL HISTORY: Past Medical History:  Diagnosis Date  . Atypical atrial flutter ([place])   . Cancer [facility])    Testicular- surgery   . COPD (chronic obstructive pulmonary disease) ([place])   . Coronary artery disease   . Diastolic dysfunction, left ventricle  05/2018   Dr. Rayann Heman- Cardiologist  . Dysrhythmia    chronic AFib  . History of kidney stones    passed  . HLD (hyperlipidemia)   . HTN (hypertension)   . Myocardial infarction ([place])    x 2 maybe 1 more  . OSA (obstructive sleep apnea)    uses CPAP  . Peripheral artery disease ([place])    pseudoaneurysm post afib ablation at [facility] 2011, s/p bilateral iliac stents  . Persistent atrial fibrillation ([place])   . Pre-diabetes   . Renal artery stenosis ([facility])    right renal artery PTA and stenting  . S/P coronary artery bypass graft x 7 11/16/96    ALLERGIES:  has No Known Allergies.  MEDICATIONS:  Current Outpatient Medications  Medication Sig Dispense Refill  . acetaminophen (TYLENOL) 500 MG tablet Take 1,000 mg every 6 (six) hours as needed by mouth (for pain.).    [name] atorvastatin (LIPITOR) 10 MG tablet TAKE 1 TABLET(10 MG) BY MOUTH DAILY (Patient taking differently: Take 10 mg by mouth every evening. TAKE 1 TABLET(10 MG) BY MOUTH DAILY) 90 tablet 2  . budesonide-formoterol (SYMBICORT) 160-4.5 MCG/ACT inhaler Inhale 2 puffs into the lungs 2 (two) times daily. 10.2 g 5  . cetirizine (ZYRTEC) 10 MG tablet Take 10 mg by mouth daily.    [name] dronedarone (MULTAQ) 400 MG tablet TAKE 1 TABLET BY MOUTH TWICE DAILY WITH MEAL (Patient taking differently: Take 400 mg by mouth in the morning and at bedtime. TAKE 1 TABLET BY MOUTH TWICE DAILY  WITH MEAL) 180 tablet 1  . empagliflozin (JARDIANCE) 10 MG TABS tablet Take 10 mg by mouth daily. Schedule appt with PCP 90 tablet 2  . Ferrous Sulfate (SLOW FE) 142 (45 Fe) MG TBCR Take 45 mg by mouth at bedtime.     . furosemide (LASIX) 40 MG tablet TAKE 1 TABLET BY MOUTH AS NEEDED (Patient taking differently: Take 40 mg by mouth daily as needed for fluid. ) 30 tablet 3  . gabapentin (NEURONTIN) 300 MG capsule Take 2 capsules (600 mg total) by mouth at bedtime. 180 capsule 2  . ketotifen (ZADITOR) 0.025 % ophthalmic solution Place 1 drop into both eyes 2 (two) times  daily as needed (allergies).    [name] lisinopril-hydrochlorothiazide (ZESTORETIC) 10-12.5 MG tablet TAKE 1 TABLET BY MOUTH DAILY (Patient taking differently: Take 1 tablet by mouth every evening. ) 90 tablet 1  . metoprolol succinate (TOPROL-XL) 100 MG 24 hr tablet TAKE 1 TABLET(100 MG) BY MOUTH DAILY (Patient taking differently: Take 100 mg by mouth every evening. TAKE 1 TABLET(100 MG) BY MOUTH DAILY) 90 tablet 3  . Multiple Vitamin (MULTIVITAMIN WITH MINERALS) TABS tablet Take 1 tablet by mouth daily. In the morning.    . niacin (NIASPAN) 1000 MG CR tablet TAKE 1 TABLET(1000 MG) BY MOUTH AT BEDTIME (Patient taking differently: Take 1,000 mg by mouth at bedtime. ) 90 tablet 3  . nicotine polacrilex (COMMIT) 2 MG lozenge Take 2 mg by mouth as needed for smoking cessation.    . NON FORMULARY CPAP at night    . prochlorperazine (COMPAZINE) 10 MG tablet Take 1 tablet (10 mg total) by mouth every 6 (six) hours as needed for nausea or vomiting. 30 tablet 0  . traMADol (ULTRAM) 50 MG tablet TAKE 1 TABLET BY MOUTH EVERY 12 HOURS AS NEEDED (Patient taking differently: Take 50 mg by mouth 2 (two) times daily as needed (pain after golf). ) 30 tablet 0  . traZODone (DESYREL) 50 MG tablet TAKE 1/2 TO 1 TABLET BY MOUTH AT BEDTIME AS NEEDED FOR SLEEP (Patient taking differently: Take 50 mg by mouth at bedtime. ) 90 tablet 3  . VENTOLIN HFA 108 (90 Base) MCG/ACT inhaler INHALE 2 PUFFS INTO THE LUNGS EVERY 6 HOURS AS NEEDED FOR WHEEZING OR SHORTNESS OF BREATH (Patient taking differently: Inhale 2 puffs into the lungs every 6 (six) hours as needed (wheezing/shortness of breath.). ) 18 g 1  . XARELTO 20 MG TABS tablet TAKE 1 TABLET BY MOUTH EVERY DAY WITH SUPPER (Patient taking differently: Take 20 mg by mouth every evening. ) 90 tablet 1   No current facility-administered medications for this visit.    SURGICAL HISTORY:  Past Surgical History:  Procedure Laterality Date  . 2-D echocardiogram  03/25/2010   Normal left  ventricular function. Mild MR, TR, trivial AR  . ATRIAL FIBRILLATION ABLATION  03/27/2010, 12/31/2010   [facility], Dr. Nadeen Landau  . BACK SURGERY  2004, 2007   Laminectomy, Discedectomy x 2  . BRONCHIAL BIOPSY  12/27/2019   Procedure: BRONCHIAL BIOPSIES;  Surgeon: Garner Nash, DO;  Location: [facility] ENDOSCOPY;  Service: Pulmonary;;  . BRONCHIAL BRUSHINGS  12/27/2019   Procedure: BRONCHIAL BRUSHINGS;  Surgeon: Garner Nash, DO;  Location: [facility];  Service: Pulmonary;;  . BRONCHIAL NEEDLE ASPIRATION BIOPSY  12/27/2019   Procedure: BRONCHIAL NEEDLE ASPIRATION BIOPSIES;  Surgeon: Garner Nash, DO;  Location: [facility] ENDOSCOPY;  Service: Pulmonary;;  . cardiac stress test  11/21/2009   Exercise capacity 5 METS. No significant  ischemia demonstrated  . CARDIOVERSION N/A 03/15/2015   Procedure: CARDIOVERSION;  Surgeon: Lorretta Harp, MD;  Location[facility];  Service: Cardiovascular;  Laterality: N/A;  . CARDIOVERSION N/A 07/10/2017   Procedure: CARDIOVERSION;  Surgeon: Pixie Casino, MD;  Location: [facility] ENDOSCOPY;  Service: Cardiovascular;  Laterality: N/A;  . CARDIOVERSION N/A 03/23/2018   Procedure: CARDIOVERSION;  Surgeon: Sanda Klein, MD;  Location: [facility] ENDOSCOPY;  Service: Cardiovascular;  Laterality: N/A;  . CORONARY ARTERY BYPASS GRAFT  1998  . ELECTROMAGNETIC NAVIGATION BROCHOSCOPY  12/27/2019   Procedure: NAVIGATION BRONCHOSCOPY;  Surgeon: Garner Nash, DO;  Location: [facility] ENDOSCOPY;  Service: Pulmonary;;  . FIDUCIAL MARKER PLACEMENT  12/27/2019   Procedure: FIDUCIAL MARKER PLACEMENT;  Surgeon: Garner Nash, DO;  Location: [facility] ENDOSCOPY;  Service: Pulmonary;;  . ORCHIECTOMY  1981   for cancer  . TOOTH EXTRACTION  05/27/2013   tooth extraction with bone graft- several -for Implants  . VIDEO BRONCHOSCOPY WITH ENDOBRONCHIAL NAVIGATION N/A 12/27/2019   Procedure: VIDEO BRONCHOSCOPY;  Surgeon: Garner Nash, DO;  Location: [facility];  Service: Pulmonary;  Laterality: N/A;     REVIEW OF SYSTEMS:   Review of Systems  Constitutional: Positive for fatigue. Negative for appetite change, chills, fever and unexpected weight change.  HENT: Negative for mouth sores, nosebleeds, sore throat and trouble swallowing.   Eyes: Negative for eye problems and icterus.  Respiratory: Positive for baseline "chest congestion". Negative for cough, hemoptysis, and wheezing.   Cardiovascular: Negative for chest pain and leg swelling.  Gastrointestinal: Positive for gas and mild nausea. Negative for abdominal pain, constipation, diarrhea, nausea and vomiting.  Genitourinary: Negative for bladder incontinence, difficulty urinating, dysuria, frequency and hematuria.   Musculoskeletal: Negative for back pain, gait problem, neck pain and neck stiffness.  Skin: Negative for itching and rash.  Neurological: Negative for dizziness, extremity weakness, gait problem, headaches, light-headedness and seizures.  Hematological: Negative for adenopathy. Does not bruise/bleed easily.  Psychiatric/Behavioral: Negative for confusion, depression and sleep disturbance. The patient is not nervous/anxious.     PHYSICAL EXAMINATION:  There were no vitals taken for this visit.  ECOG PERFORMANCE STATUS: 1 - Symptomatic but completely ambulatory  Physical Exam  Constitutional: Oriented to person, place, and time and well-developed, well-nourished, and in no distress.  HENT:  Head: Normocephalic and atraumatic.  Mouth/Throat: Oropharynx is clear and moist. No oropharyngeal exudate.  Eyes: Conjunctivae are normal. Right eye exhibits no discharge. Left eye exhibits no discharge. No scleral icterus.  Neck: Normal range of motion. Neck supple.  Cardiovascular: Normal rate, regular rhythm, normal heart sounds and intact distal pulses.   Pulmonary/Chest: Effort normal and breath sounds normal. No respiratory distress. No wheezes. No rales.  Abdominal: Soft. Bowel sounds are normal. Exhibits no distension and  no mass. There is no tenderness.  Musculoskeletal: Normal range of motion. Exhibits no edema.  Lymphadenopathy:    No cervical adenopathy.  Neurological: Alert and oriented to person, place, and time. Exhibits normal muscle tone. Gait normal. Coordination normal.  Skin: Skin is warm and dry. No rash noted. Not diaphoretic. No erythema. No pallor.  Psychiatric: Mood, memory and judgment normal.  Vitals reviewed.  LABORATORY DATA: Lab Results  Component Value Date   WBC 8.2 01/10/2020   HGB 16.2 01/10/2020   HCT 48.3 01/10/2020   MCV 98.2 01/10/2020   PLT 167 01/10/2020      Chemistry      Component Value Date/Time   NA 138 01/10/2020 0822   NA 142 04/18/2010  0000   K 3.9 01/10/2020 0822   CL 102 01/10/2020 0822   CO2 24 01/10/2020 0822   BUN 15 01/10/2020 0822   CREATININE 1.26 (H) 01/10/2020 0822   CREATININE 1.41 (H) 09/14/2019 0943      Component Value Date/Time   CALCIUM 9.6 01/10/2020 0822   ALKPHOS 89 01/10/2020 0822   AST 24 01/10/2020 0822   ALT 25 01/10/2020 0822   BILITOT 0.7 01/10/2020 0822       RADIOGRAPHIC STUDIES:  DG CHEST PORT 1 VIEW  Result Date: 12/27/2019 CLINICAL DATA:  Post right upper lobe bronchoscopy EXAM: PORTABLE CHEST 1 VIEW COMPARISON:  09/15/2018 FINDINGS: Cardiomegaly, prior CABG. Increased airspace opacity medially in the right upper lobe may reflect changes of bronchoscopy. No pneumothorax. No effusions or overt edema. No acute bony abnormality. IMPRESSION: Medial right upper lobe airspace opacity may reflect changes of bronchoscopy. No pneumothorax. Electronically Signed   By: Rolm Baptise M.D.   On: 12/27/2019 10:58   CT Super D Chest Wo Contrast  Result Date: 12/19/2019 CLINICAL DATA:  Lung nodule, bronchoscopy planning EXAM: CT CHEST WITHOUT CONTRAST TECHNIQUE: Multidetector CT imaging of the chest was performed using thin slice collimation for electromagnetic bronchoscopy planning purposes, without intravenous contrast. COMPARISON:   PET-CT, 11/28/2019, CT chest, 11/16/2019 FINDINGS: Cardiovascular: Aortic atherosclerosis. Normal heart size. Extensive 3 vessel coronary artery calcifications and/or stents. No pericardial effusion. Mediastinum/Nodes: No enlarged mediastinal, hilar, or axillary lymph nodes. Thyroid gland, trachea, and esophagus demonstrate no significant findings. Lungs/Pleura: Slight interval enlargement of a previously PET avid nodule of the posterior apical segment right upper lobe, measuring 1.2 cm (series 3, image 40). No pleural effusion or pneumothorax. Upper Abdomen: No acute abnormality.  Cholelithiasis. Musculoskeletal: No chest wall mass or suspicious bone lesions identified. IMPRESSION: 1. Slight interval enlargement of a previously PET avid nodule of the posterior apical segment right upper lobe, measuring 1.2 cm, which remains highly concerning for malignancy. 2.  Coronary artery disease.  Aortic Atherosclerosis (ICD10-I70.0). Electronically Signed   By: Eddie Candle M.D.   On: 12/19/2019 21:13   DG C-ARM BRONCHOSCOPY  Result Date: 12/27/2019 C-ARM BRONCHOSCOPY: Fluoroscopy was utilized by the requesting physician.  No radiographic interpretation.     ASSESSMENT/PLAN:  This is a very pleasant 74 year old Caucasian male diagnosed with limited stage (T1b, N0,M0) non-small cell lung cancer.  He presented with a right upper lobe pulmonary nodule that was hypermetabolic on PET scan.  It was biopsied and proven to be consistent with small cell lung cancer.  He was diagnosed in April 2021.  The patient is currently undergoing concurrent chemoradiation with carboplatin for an AUC of 5 on day 1 and etoposide 100 mg per metered squared on days 1, 2, and 3 IV every 3 weeks.  The patient is not a good candidate for cisplatin due to his renal insufficiency and other comorbidities.  The patient is status post 1 cycle.    Labs were reviewed.  Recommend that the patient continue on the same treatment on the same dose.   He will receive cycle #2 in 2 weeks as scheduled. We will continue to monitor his blood work closely.  We will see him back for follow-up visit in 2 weeks for evaluation for starting cycle #2.  The patient will have his staging brain MRI performed on 01/25/2020 as planned.  The patient inquired about a port-a-cath. We discussed the port-a-cath procedure, maintenance flushes every 6 weeks, and that he would have somewhere between 4-6 cycles of chemo.  The patient is going to see how his next cycle of treatment goes but may consider a port-a-cath placement due to having a challening time obtaining IV access on the third day of his first cycle.   For his gas, he will use gas x. He also states he is going to eat more yogurt.   He will use his anti-emetic for his mild nausea. Encouraged activity as tolerated.  The patient was advised to call immediately if he has any concerning symptoms in the interval. The patient voices understanding of current disease status and treatment options and is in agreement with the current care plan. All questions were answered. The patient knows to call the clinic with any problems, questions or concerns. We can certainly see the patient much sooner if necessary   No orders of the defined types were placed in this encounter.    Kurstyn Larios L Shammara Jarrett, PA-C 01/16/20

## 2020-01-17 ENCOUNTER — Encounter: Payer: Self-pay | Admitting: Physician Assistant

## 2020-01-17 ENCOUNTER — Ambulatory Visit
Admission: RE | Admit: 2020-01-17 | Discharge: 2020-01-17 | Disposition: A | Discharge status: Home / Self Care | Payer: Medicare Other | Source: Ambulatory Visit | Attending: Radiation Oncology | Admitting: Radiation Oncology

## 2020-01-17 ENCOUNTER — Inpatient Hospital Stay: Payer: Medicare Other | Admitting: Physician Assistant

## 2020-01-17 ENCOUNTER — Other Ambulatory Visit: Payer: Self-pay

## 2020-01-17 ENCOUNTER — Other Ambulatory Visit: Payer: Medicare Other

## 2020-01-17 ENCOUNTER — Inpatient Hospital Stay: Payer: Medicare Other

## 2020-01-17 VITALS — BP 125/59 | HR 80 | Temp 98.6°F | Resp 18 | Ht 72.0 in | Wt 216.3 lb

## 2020-01-17 DIAGNOSIS — E785 Hyperlipidemia, unspecified: Secondary | ICD-10-CM | POA: Diagnosis not present

## 2020-01-17 DIAGNOSIS — I4892 Unspecified atrial flutter: Secondary | ICD-10-CM | POA: Diagnosis not present

## 2020-01-17 DIAGNOSIS — I2510 Atherosclerotic heart disease of native coronary artery without angina pectoris: Secondary | ICD-10-CM | POA: Diagnosis not present

## 2020-01-17 DIAGNOSIS — J449 Chronic obstructive pulmonary disease, unspecified: Secondary | ICD-10-CM | POA: Diagnosis not present

## 2020-01-17 DIAGNOSIS — I700 Atherosclerosis of aorta: Secondary | ICD-10-CM | POA: Diagnosis not present

## 2020-01-17 DIAGNOSIS — Z510 Encounter for antineoplastic radiation therapy: Secondary | ICD-10-CM | POA: Diagnosis not present

## 2020-01-17 DIAGNOSIS — I252 Old myocardial infarction: Secondary | ICD-10-CM | POA: Diagnosis not present

## 2020-01-17 DIAGNOSIS — Z5111 Encounter for antineoplastic chemotherapy: Secondary | ICD-10-CM | POA: Diagnosis not present

## 2020-01-17 DIAGNOSIS — I701 Atherosclerosis of renal artery: Secondary | ICD-10-CM | POA: Diagnosis not present

## 2020-01-17 DIAGNOSIS — C3411 Malignant neoplasm of upper lobe, right bronchus or lung: Secondary | ICD-10-CM | POA: Insufficient documentation

## 2020-01-17 DIAGNOSIS — R110 Nausea: Secondary | ICD-10-CM | POA: Diagnosis not present

## 2020-01-17 DIAGNOSIS — C3491 Malignant neoplasm of unspecified part of right bronchus or lung: Secondary | ICD-10-CM

## 2020-01-17 DIAGNOSIS — I119 Hypertensive heart disease without heart failure: Secondary | ICD-10-CM | POA: Diagnosis not present

## 2020-01-17 DIAGNOSIS — G4733 Obstructive sleep apnea (adult) (pediatric): Secondary | ICD-10-CM | POA: Diagnosis not present

## 2020-01-17 DIAGNOSIS — G4730 Sleep apnea, unspecified: Secondary | ICD-10-CM | POA: Diagnosis not present

## 2020-01-17 DIAGNOSIS — Z87891 Personal history of nicotine dependence: Secondary | ICD-10-CM | POA: Diagnosis not present

## 2020-01-17 DIAGNOSIS — Z7689 Persons encountering health services in other specified circumstances: Secondary | ICD-10-CM | POA: Diagnosis not present

## 2020-01-17 DIAGNOSIS — I10 Essential (primary) hypertension: Secondary | ICD-10-CM | POA: Diagnosis not present

## 2020-01-17 LAB — CBC WITH DIFFERENTIAL (CANCER CENTER ONLY)
Abs Immature Granulocytes: 0.14 10*3/uL — ABNORMAL HIGH (ref 0.00–0.07)
Basophils Absolute: 0 10*3/uL (ref 0.0–0.1)
Basophils Relative: 1 %
Eosinophils Absolute: 0.1 10*3/uL (ref 0.0–0.5)
Eosinophils Relative: 2 %
HCT: 46 % (ref 39.0–52.0)
Hemoglobin: 15.5 g/dL (ref 13.0–17.0)
Immature Granulocytes: 3 %
Lymphocytes Relative: 22 %
Lymphs Abs: 1.1 10*3/uL (ref 0.7–4.0)
MCH: 33.3 pg (ref 26.0–34.0)
MCHC: 33.7 g/dL (ref 30.0–36.0)
MCV: 98.9 fL (ref 80.0–100.0)
Monocytes Absolute: 0.1 10*3/uL (ref 0.1–1.0)
Monocytes Relative: 2 %
Neutro Abs: 3.5 10*3/uL (ref 1.7–7.7)
Neutrophils Relative %: 70 %
Platelet Count: 93 10*3/uL — ABNORMAL LOW (ref 150–400)
RBC: 4.65 MIL/uL (ref 4.22–5.81)
RDW: 13.4 % (ref 11.5–15.5)
WBC Count: 4.9 10*3/uL (ref 4.0–10.5)
nRBC: 0 % (ref 0.0–0.2)

## 2020-01-17 LAB — CMP (CANCER CENTER ONLY)
ALT: 31 U/L (ref 0–44)
AST: 27 U/L (ref 15–41)
Albumin: 3.6 g/dL (ref 3.5–5.0)
Alkaline Phosphatase: 86 U/L (ref 38–126)
Anion gap: 11 (ref 5–15)
BUN: 22 mg/dL (ref 8–23)
CO2: 31 mmol/L (ref 22–32)
Calcium: 9.6 mg/dL (ref 8.9–10.3)
Chloride: 97 mmol/L — ABNORMAL LOW (ref 98–111)
Creatinine: 1.19 mg/dL (ref 0.61–1.24)
GFR, Est AFR Am: >60 mL/min (ref >60)
GFR, Estimated: >60 mL/min (ref >60)
Glucose, Bld: 137 mg/dL — ABNORMAL HIGH (ref 70–99)
Potassium: 4.5 mmol/L (ref 3.5–5.1)
Sodium: 139 mmol/L (ref 135–145)
Total Bilirubin: 0.9 mg/dL (ref 0.3–1.2)
Total Protein: 6.6 g/dL (ref 6.5–8.1)

## 2020-01-18 ENCOUNTER — Other Ambulatory Visit (HOSPITAL_COMMUNITY): Payer: Self-pay | Admitting: *Deleted

## 2020-01-18 DIAGNOSIS — I480 Paroxysmal atrial fibrillation: Secondary | ICD-10-CM

## 2020-01-18 MED ORDER — MULTAQ 400 MG PO TABS: ORAL_TABLET | ORAL | 1 refills | Status: DC

## 2020-01-19 ENCOUNTER — Telehealth: Payer: Self-pay | Admitting: Internal Medicine

## 2020-01-19 NOTE — Telephone Encounter (Signed)
Scheduled per 5/18 los. Pt aware off appts- noted to give pt updated calendar.

## 2020-01-20 DIAGNOSIS — Z510 Encounter for antineoplastic radiation therapy: Secondary | ICD-10-CM | POA: Diagnosis not present

## 2020-01-20 DIAGNOSIS — C3411 Malignant neoplasm of upper lobe, right bronchus or lung: Secondary | ICD-10-CM | POA: Diagnosis not present

## 2020-01-23 ENCOUNTER — Ambulatory Visit: Admission: RE | Admit: 2020-01-23 | Payer: Medicare Other | Source: Ambulatory Visit | Admitting: Radiation Oncology

## 2020-01-23 ENCOUNTER — Other Ambulatory Visit: Payer: Self-pay

## 2020-01-24 ENCOUNTER — Telehealth: Payer: Self-pay

## 2020-01-24 ENCOUNTER — Other Ambulatory Visit: Payer: Self-pay | Admitting: Family Medicine

## 2020-01-24 ENCOUNTER — Other Ambulatory Visit: Payer: Self-pay | Admitting: Internal Medicine

## 2020-01-24 ENCOUNTER — Ambulatory Visit: Payer: Medicare Other | Admitting: Radiation Oncology

## 2020-01-24 ENCOUNTER — Inpatient Hospital Stay: Payer: Medicare Other

## 2020-01-24 ENCOUNTER — Encounter: Payer: Self-pay | Admitting: Internal Medicine

## 2020-01-24 ENCOUNTER — Other Ambulatory Visit: Payer: Self-pay

## 2020-01-24 VITALS — BP 108/56 | HR 82 | Temp 99.4°F | Resp 20

## 2020-01-24 DIAGNOSIS — I2510 Atherosclerotic heart disease of native coronary artery without angina pectoris: Secondary | ICD-10-CM | POA: Diagnosis not present

## 2020-01-24 DIAGNOSIS — Z5111 Encounter for antineoplastic chemotherapy: Secondary | ICD-10-CM | POA: Diagnosis not present

## 2020-01-24 DIAGNOSIS — D701 Agranulocytosis secondary to cancer chemotherapy: Secondary | ICD-10-CM | POA: Insufficient documentation

## 2020-01-24 DIAGNOSIS — Z7689 Persons encountering health services in other specified circumstances: Secondary | ICD-10-CM | POA: Diagnosis not present

## 2020-01-24 DIAGNOSIS — I700 Atherosclerosis of aorta: Secondary | ICD-10-CM | POA: Diagnosis not present

## 2020-01-24 DIAGNOSIS — E785 Hyperlipidemia, unspecified: Secondary | ICD-10-CM | POA: Diagnosis not present

## 2020-01-24 DIAGNOSIS — T451X5A Adverse effect of antineoplastic and immunosuppressive drugs, initial encounter: Secondary | ICD-10-CM | POA: Insufficient documentation

## 2020-01-24 DIAGNOSIS — J449 Chronic obstructive pulmonary disease, unspecified: Secondary | ICD-10-CM | POA: Diagnosis not present

## 2020-01-24 DIAGNOSIS — R110 Nausea: Secondary | ICD-10-CM | POA: Diagnosis not present

## 2020-01-24 DIAGNOSIS — G4730 Sleep apnea, unspecified: Secondary | ICD-10-CM | POA: Diagnosis not present

## 2020-01-24 DIAGNOSIS — C3411 Malignant neoplasm of upper lobe, right bronchus or lung: Secondary | ICD-10-CM | POA: Diagnosis not present

## 2020-01-24 DIAGNOSIS — Z87891 Personal history of nicotine dependence: Secondary | ICD-10-CM | POA: Diagnosis not present

## 2020-01-24 DIAGNOSIS — I119 Hypertensive heart disease without heart failure: Secondary | ICD-10-CM | POA: Diagnosis not present

## 2020-01-24 DIAGNOSIS — C3491 Malignant neoplasm of unspecified part of right bronchus or lung: Secondary | ICD-10-CM

## 2020-01-24 DIAGNOSIS — I701 Atherosclerosis of renal artery: Secondary | ICD-10-CM | POA: Diagnosis not present

## 2020-01-24 DIAGNOSIS — I252 Old myocardial infarction: Secondary | ICD-10-CM | POA: Diagnosis not present

## 2020-01-24 DIAGNOSIS — I10 Essential (primary) hypertension: Secondary | ICD-10-CM | POA: Diagnosis not present

## 2020-01-24 DIAGNOSIS — G4733 Obstructive sleep apnea (adult) (pediatric): Secondary | ICD-10-CM | POA: Diagnosis not present

## 2020-01-24 DIAGNOSIS — I4892 Unspecified atrial flutter: Secondary | ICD-10-CM | POA: Diagnosis not present

## 2020-01-24 LAB — CBC WITH DIFFERENTIAL (CANCER CENTER ONLY)
Abs Immature Granulocytes: 0 10*3/uL (ref 0.00–0.07)
Basophils Absolute: 0 10*3/uL (ref 0.0–0.1)
Basophils Relative: 1 %
Eosinophils Absolute: 0 10*3/uL (ref 0.0–0.5)
Eosinophils Relative: 1 %
HCT: 42.6 % (ref 39.0–52.0)
Hemoglobin: 14.4 g/dL (ref 13.0–17.0)
Immature Granulocytes: 0 %
Lymphocytes Relative: 67 %
Lymphs Abs: 1.3 10*3/uL (ref 0.7–4.0)
MCH: 33.3 pg (ref 26.0–34.0)
MCHC: 33.8 g/dL (ref 30.0–36.0)
MCV: 98.6 fL (ref 80.0–100.0)
Monocytes Absolute: 0.6 10*3/uL (ref 0.1–1.0)
Monocytes Relative: 28 %
Neutro Abs: 0.1 10*3/uL — CL (ref 1.7–7.7)
Neutrophils Relative %: 3 %
Platelet Count: 119 10*3/uL — ABNORMAL LOW (ref 150–400)
RBC: 4.32 MIL/uL (ref 4.22–5.81)
RDW: 13 % (ref 11.5–15.5)
WBC Count: 2.1 10*3/uL — ABNORMAL LOW (ref 4.0–10.5)
nRBC: 0 % (ref 0.0–0.2)

## 2020-01-24 LAB — CMP (CANCER CENTER ONLY)
ALT: 31 U/L (ref 0–44)
AST: 20 U/L (ref 15–41)
Albumin: 3.6 g/dL (ref 3.5–5.0)
Alkaline Phosphatase: 129 U/L — ABNORMAL HIGH (ref 38–126)
Anion gap: 8 (ref 5–15)
BUN: 16 mg/dL (ref 8–23)
CO2: 30 mmol/L (ref 22–32)
Calcium: 10.1 mg/dL (ref 8.9–10.3)
Chloride: 97 mmol/L — ABNORMAL LOW (ref 98–111)
Creatinine: 1.59 mg/dL — ABNORMAL HIGH (ref 0.61–1.24)
GFR, Est AFR Am: 50 mL/min — ABNORMAL LOW (ref >60)
GFR, Estimated: 43 mL/min — ABNORMAL LOW (ref >60)
Glucose, Bld: 157 mg/dL — ABNORMAL HIGH (ref 70–99)
Potassium: 4.5 mmol/L (ref 3.5–5.1)
Sodium: 135 mmol/L (ref 135–145)
Total Bilirubin: 1.2 mg/dL (ref 0.3–1.2)
Total Protein: 7.5 g/dL (ref 6.5–8.1)

## 2020-01-24 MED ORDER — FILGRASTIM-AAFI 480 MCG/0.8ML IJ SOSY
480.0000 ug | PREFILLED_SYRINGE | Freq: Once | INTRAMUSCULAR | Status: AC
  Administered 2020-01-24: 480 ug via SUBCUTANEOUS
  Filled 2020-01-24: qty 0.8

## 2020-01-24 MED ORDER — TRAMADOL HCL 50 MG PO TABS: 50.0000 mg | ORAL_TABLET | Freq: Two times a day (BID) | ORAL | 0 refills | Status: DC | PRN

## 2020-01-24 NOTE — Telephone Encounter (Signed)
Per Dr. Julien Nordmann patient needs to be scheduled for Granix injections x 3 starting today. Patient made aware and stated he can come in at 3:00 today. Scheduling message sent to schedule patient for injections x 3. Patient verbalized understanding of need for injections and neutropenic precautions.

## 2020-01-24 NOTE — Progress Notes (Signed)
Pharmacist Chemotherapy Monitoring - Follow Up Assessment    I verify that I have reviewed each item in the below checklist:  . Regimen for the patient is scheduled for the appropriate day and plan matches scheduled date. [name] Appropriate non-routine labs are ordered dependent on drug ordered. . If applicable, additional medications reviewed and ordered per protocol based on lifetime cumulative doses and/or treatment regimen.   Plan for follow-up and/or issues identified: Yes . I-vent associated with next due treatment: Yes . MD and/or nursing notified: No   Kennith Center, Pharm.D., CPP 01/24/2020@3 :15 PM

## 2020-01-24 NOTE — Patient Instructions (Signed)

## 2020-01-25 ENCOUNTER — Other Ambulatory Visit: Payer: Self-pay | Admitting: Internal Medicine

## 2020-01-25 ENCOUNTER — Ambulatory Visit (HOSPITAL_COMMUNITY)
Admission: RE | Admit: 2020-01-25 | Discharge: 2020-01-25 | Disposition: A | Discharge status: Home / Self Care | Payer: Medicare Other | Source: Ambulatory Visit | Attending: Internal Medicine | Admitting: Internal Medicine

## 2020-01-25 ENCOUNTER — Other Ambulatory Visit: Payer: Self-pay

## 2020-01-25 ENCOUNTER — Ambulatory Visit
Admission: RE | Admit: 2020-01-25 | Discharge: 2020-01-25 | Disposition: A | Discharge status: Home / Self Care | Payer: Medicare Other | Source: Ambulatory Visit | Attending: Radiation Oncology | Admitting: Radiation Oncology

## 2020-01-25 ENCOUNTER — Inpatient Hospital Stay: Payer: Medicare Other

## 2020-01-25 VITALS — BP 118/72 | HR 78 | Temp 98.2°F | Resp 18

## 2020-01-25 DIAGNOSIS — I252 Old myocardial infarction: Secondary | ICD-10-CM | POA: Diagnosis not present

## 2020-01-25 DIAGNOSIS — Z510 Encounter for antineoplastic radiation therapy: Secondary | ICD-10-CM | POA: Diagnosis not present

## 2020-01-25 DIAGNOSIS — C3490 Malignant neoplasm of unspecified part of unspecified bronchus or lung: Secondary | ICD-10-CM | POA: Diagnosis not present

## 2020-01-25 DIAGNOSIS — Z87891 Personal history of nicotine dependence: Secondary | ICD-10-CM | POA: Diagnosis not present

## 2020-01-25 DIAGNOSIS — I10 Essential (primary) hypertension: Secondary | ICD-10-CM | POA: Diagnosis not present

## 2020-01-25 DIAGNOSIS — E785 Hyperlipidemia, unspecified: Secondary | ICD-10-CM | POA: Diagnosis not present

## 2020-01-25 DIAGNOSIS — G4730 Sleep apnea, unspecified: Secondary | ICD-10-CM | POA: Diagnosis not present

## 2020-01-25 DIAGNOSIS — J449 Chronic obstructive pulmonary disease, unspecified: Secondary | ICD-10-CM | POA: Diagnosis not present

## 2020-01-25 DIAGNOSIS — C3491 Malignant neoplasm of unspecified part of right bronchus or lung: Secondary | ICD-10-CM

## 2020-01-25 DIAGNOSIS — R110 Nausea: Secondary | ICD-10-CM | POA: Diagnosis not present

## 2020-01-25 DIAGNOSIS — T451X5A Adverse effect of antineoplastic and immunosuppressive drugs, initial encounter: Secondary | ICD-10-CM

## 2020-01-25 DIAGNOSIS — G4733 Obstructive sleep apnea (adult) (pediatric): Secondary | ICD-10-CM | POA: Diagnosis not present

## 2020-01-25 DIAGNOSIS — I701 Atherosclerosis of renal artery: Secondary | ICD-10-CM | POA: Diagnosis not present

## 2020-01-25 DIAGNOSIS — I2510 Atherosclerotic heart disease of native coronary artery without angina pectoris: Secondary | ICD-10-CM | POA: Diagnosis not present

## 2020-01-25 DIAGNOSIS — Z7689 Persons encountering health services in other specified circumstances: Secondary | ICD-10-CM | POA: Diagnosis not present

## 2020-01-25 DIAGNOSIS — I700 Atherosclerosis of aorta: Secondary | ICD-10-CM | POA: Diagnosis not present

## 2020-01-25 DIAGNOSIS — I119 Hypertensive heart disease without heart failure: Secondary | ICD-10-CM | POA: Diagnosis not present

## 2020-01-25 DIAGNOSIS — Z5111 Encounter for antineoplastic chemotherapy: Secondary | ICD-10-CM | POA: Diagnosis not present

## 2020-01-25 DIAGNOSIS — I4892 Unspecified atrial flutter: Secondary | ICD-10-CM | POA: Diagnosis not present

## 2020-01-25 DIAGNOSIS — D701 Agranulocytosis secondary to cancer chemotherapy: Secondary | ICD-10-CM

## 2020-01-25 DIAGNOSIS — C3411 Malignant neoplasm of upper lobe, right bronchus or lung: Secondary | ICD-10-CM | POA: Diagnosis not present

## 2020-01-25 MED ORDER — GADOBUTROL 1 MMOL/ML IV SOLN
10.0000 mL | Freq: Once | INTRAVENOUS | Status: AC | PRN
  Administered 2020-01-25: 10 mL via INTRAVENOUS

## 2020-01-25 MED ORDER — FILGRASTIM-AAFI 480 MCG/0.8ML IJ SOSY
480.0000 ug | PREFILLED_SYRINGE | Freq: Once | INTRAMUSCULAR | Status: AC
  Administered 2020-01-25: 480 ug via SUBCUTANEOUS
  Filled 2020-01-25: qty 0.8

## 2020-01-25 NOTE — Patient Instructions (Signed)

## 2020-01-26 ENCOUNTER — Ambulatory Visit: Payer: Medicare Other | Admitting: Radiation Oncology

## 2020-01-26 ENCOUNTER — Inpatient Hospital Stay: Payer: Medicare Other

## 2020-01-26 ENCOUNTER — Other Ambulatory Visit: Payer: Self-pay

## 2020-01-26 VITALS — BP 118/72 | HR 72 | Temp 98.2°F | Resp 18

## 2020-01-26 DIAGNOSIS — I10 Essential (primary) hypertension: Secondary | ICD-10-CM | POA: Diagnosis not present

## 2020-01-26 DIAGNOSIS — Z87891 Personal history of nicotine dependence: Secondary | ICD-10-CM | POA: Diagnosis not present

## 2020-01-26 DIAGNOSIS — I4892 Unspecified atrial flutter: Secondary | ICD-10-CM | POA: Diagnosis not present

## 2020-01-26 DIAGNOSIS — G4733 Obstructive sleep apnea (adult) (pediatric): Secondary | ICD-10-CM | POA: Diagnosis not present

## 2020-01-26 DIAGNOSIS — I701 Atherosclerosis of renal artery: Secondary | ICD-10-CM | POA: Diagnosis not present

## 2020-01-26 DIAGNOSIS — E785 Hyperlipidemia, unspecified: Secondary | ICD-10-CM | POA: Diagnosis not present

## 2020-01-26 DIAGNOSIS — I2510 Atherosclerotic heart disease of native coronary artery without angina pectoris: Secondary | ICD-10-CM | POA: Diagnosis not present

## 2020-01-26 DIAGNOSIS — J449 Chronic obstructive pulmonary disease, unspecified: Secondary | ICD-10-CM | POA: Diagnosis not present

## 2020-01-26 DIAGNOSIS — T451X5A Adverse effect of antineoplastic and immunosuppressive drugs, initial encounter: Secondary | ICD-10-CM

## 2020-01-26 DIAGNOSIS — I252 Old myocardial infarction: Secondary | ICD-10-CM | POA: Diagnosis not present

## 2020-01-26 DIAGNOSIS — R110 Nausea: Secondary | ICD-10-CM | POA: Diagnosis not present

## 2020-01-26 DIAGNOSIS — I119 Hypertensive heart disease without heart failure: Secondary | ICD-10-CM | POA: Diagnosis not present

## 2020-01-26 DIAGNOSIS — Z7689 Persons encountering health services in other specified circumstances: Secondary | ICD-10-CM | POA: Diagnosis not present

## 2020-01-26 DIAGNOSIS — Z5111 Encounter for antineoplastic chemotherapy: Secondary | ICD-10-CM | POA: Diagnosis not present

## 2020-01-26 DIAGNOSIS — G4730 Sleep apnea, unspecified: Secondary | ICD-10-CM | POA: Diagnosis not present

## 2020-01-26 DIAGNOSIS — I700 Atherosclerosis of aorta: Secondary | ICD-10-CM | POA: Diagnosis not present

## 2020-01-26 DIAGNOSIS — C3411 Malignant neoplasm of upper lobe, right bronchus or lung: Secondary | ICD-10-CM | POA: Diagnosis not present

## 2020-01-26 DIAGNOSIS — D701 Agranulocytosis secondary to cancer chemotherapy: Secondary | ICD-10-CM

## 2020-01-26 MED ORDER — FILGRASTIM-AAFI 480 MCG/0.8ML IJ SOSY
480.0000 ug | PREFILLED_SYRINGE | Freq: Once | INTRAMUSCULAR | Status: AC
  Administered 2020-01-26: 480 ug via SUBCUTANEOUS
  Filled 2020-01-26: qty 0.8

## 2020-01-26 NOTE — Patient Instructions (Signed)

## 2020-01-27 ENCOUNTER — Ambulatory Visit: Payer: Medicare Other | Admitting: Radiation Oncology

## 2020-01-27 ENCOUNTER — Telehealth: Payer: Self-pay | Admitting: Internal Medicine

## 2020-01-27 ENCOUNTER — Ambulatory Visit
Admission: RE | Admit: 2020-01-27 | Discharge: 2020-01-27 | Disposition: A | Discharge status: Home / Self Care | Payer: Medicare Other | Source: Ambulatory Visit | Attending: Radiation Oncology | Admitting: Radiation Oncology

## 2020-01-27 ENCOUNTER — Other Ambulatory Visit: Payer: Self-pay

## 2020-01-27 DIAGNOSIS — C3411 Malignant neoplasm of upper lobe, right bronchus or lung: Secondary | ICD-10-CM | POA: Diagnosis not present

## 2020-01-27 DIAGNOSIS — Z510 Encounter for antineoplastic radiation therapy: Secondary | ICD-10-CM | POA: Diagnosis not present

## 2020-01-27 NOTE — Telephone Encounter (Signed)
R/s appt per 5/26 sch message - pt is aware of appt date and time

## 2020-01-31 ENCOUNTER — Inpatient Hospital Stay: Payer: Medicare Other | Admitting: Physician Assistant

## 2020-01-31 ENCOUNTER — Ambulatory Visit: Payer: Medicare Other | Admitting: Radiation Oncology

## 2020-01-31 ENCOUNTER — Inpatient Hospital Stay: Payer: Medicare Other

## 2020-01-31 ENCOUNTER — Other Ambulatory Visit: Payer: Medicare Other

## 2020-01-31 ENCOUNTER — Telehealth: Payer: Self-pay | Admitting: Family Medicine

## 2020-01-31 ENCOUNTER — Ambulatory Visit: Payer: Medicare Other

## 2020-01-31 NOTE — Telephone Encounter (Signed)
Received fax for PA on Tramadol 50 mg sent through cover my meds and received authorization valid from 01/31/2020 - 01/30/2021 - [facility]

## 2020-02-01 ENCOUNTER — Inpatient Hospital Stay: Payer: Medicare Other

## 2020-02-01 ENCOUNTER — Other Ambulatory Visit: Payer: Self-pay

## 2020-02-01 ENCOUNTER — Ambulatory Visit
Admission: RE | Admit: 2020-02-01 | Discharge: 2020-02-01 | Disposition: A | Discharge status: Home / Self Care | Payer: Medicare Other | Source: Ambulatory Visit | Attending: Radiation Oncology | Admitting: Radiation Oncology

## 2020-02-01 ENCOUNTER — Encounter: Payer: Self-pay | Admitting: Radiation Oncology

## 2020-02-01 DIAGNOSIS — Z510 Encounter for antineoplastic radiation therapy: Secondary | ICD-10-CM | POA: Insufficient documentation

## 2020-02-01 DIAGNOSIS — C3411 Malignant neoplasm of upper lobe, right bronchus or lung: Secondary | ICD-10-CM | POA: Diagnosis not present

## 2020-02-01 NOTE — Progress Notes (Signed)
Pharmacist Chemotherapy Monitoring - Follow Up Assessment    I verify that I have reviewed each item in the below checklist:  . Regimen for the patient is scheduled for the appropriate day and plan matches scheduled date. [name] Appropriate non-routine labs are ordered dependent on drug ordered. . If applicable, additional medications reviewed and ordered per protocol based on lifetime cumulative doses and/or treatment regimen.   Plan for follow-up and/or issues identified: Yes . I-vent associated with next due treatment: Yes . MD and/or nursing notified: Yes  [name] 02/01/2020 9:07 AM

## 2020-02-02 ENCOUNTER — Inpatient Hospital Stay: Payer: Medicare Other

## 2020-02-02 ENCOUNTER — Ambulatory Visit: Payer: Medicare Other | Admitting: Radiation Oncology

## 2020-02-03 ENCOUNTER — Other Ambulatory Visit: Payer: Self-pay

## 2020-02-03 DIAGNOSIS — J849 Interstitial pulmonary disease, unspecified: Secondary | ICD-10-CM

## 2020-02-03 MED ORDER — BUDESONIDE-FORMOTEROL FUMARATE 160-4.5 MCG/ACT IN AERO: 2.0000 | INHALATION_SPRAY | Freq: Two times a day (BID) | RESPIRATORY_TRACT | 3 refills | Status: DC

## 2020-02-04 ENCOUNTER — Ambulatory Visit: Payer: Medicare Other

## 2020-02-07 ENCOUNTER — Other Ambulatory Visit: Payer: Medicare Other

## 2020-02-07 ENCOUNTER — Other Ambulatory Visit: Payer: Self-pay

## 2020-02-07 ENCOUNTER — Inpatient Hospital Stay: Payer: Medicare Other | Admitting: Internal Medicine

## 2020-02-07 ENCOUNTER — Encounter: Payer: Self-pay | Admitting: Internal Medicine

## 2020-02-07 ENCOUNTER — Ambulatory Visit: Payer: Medicare Other | Admitting: Radiation Oncology

## 2020-02-07 ENCOUNTER — Inpatient Hospital Stay: Payer: Medicare Other

## 2020-02-07 ENCOUNTER — Telehealth: Payer: Self-pay | Admitting: Pulmonary Disease

## 2020-02-07 ENCOUNTER — Inpatient Hospital Stay: Payer: Medicare Other | Attending: Internal Medicine

## 2020-02-07 VITALS — BP 95/22 | HR 65 | Temp 97.6°F | Resp 17 | Ht 72.0 in | Wt 217.0 lb

## 2020-02-07 VITALS — BP 96/61 | HR 63

## 2020-02-07 DIAGNOSIS — C3411 Malignant neoplasm of upper lobe, right bronchus or lung: Secondary | ICD-10-CM

## 2020-02-07 DIAGNOSIS — I2510 Atherosclerotic heart disease of native coronary artery without angina pectoris: Secondary | ICD-10-CM | POA: Diagnosis not present

## 2020-02-07 DIAGNOSIS — Z5111 Encounter for antineoplastic chemotherapy: Secondary | ICD-10-CM | POA: Diagnosis not present

## 2020-02-07 DIAGNOSIS — C3491 Malignant neoplasm of unspecified part of right bronchus or lung: Secondary | ICD-10-CM

## 2020-02-07 DIAGNOSIS — Z79899 Other long term (current) drug therapy: Secondary | ICD-10-CM | POA: Diagnosis not present

## 2020-02-07 DIAGNOSIS — E785 Hyperlipidemia, unspecified: Secondary | ICD-10-CM | POA: Insufficient documentation

## 2020-02-07 DIAGNOSIS — Z87442 Personal history of urinary calculi: Secondary | ICD-10-CM | POA: Diagnosis not present

## 2020-02-07 DIAGNOSIS — I10 Essential (primary) hypertension: Secondary | ICD-10-CM | POA: Diagnosis not present

## 2020-02-07 DIAGNOSIS — I252 Old myocardial infarction: Secondary | ICD-10-CM | POA: Insufficient documentation

## 2020-02-07 DIAGNOSIS — Z7901 Long term (current) use of anticoagulants: Secondary | ICD-10-CM | POA: Diagnosis not present

## 2020-02-07 DIAGNOSIS — Z7689 Persons encountering health services in other specified circumstances: Secondary | ICD-10-CM | POA: Insufficient documentation

## 2020-02-07 DIAGNOSIS — Z5112 Encounter for antineoplastic immunotherapy: Secondary | ICD-10-CM | POA: Diagnosis not present

## 2020-02-07 DIAGNOSIS — I739 Peripheral vascular disease, unspecified: Secondary | ICD-10-CM | POA: Diagnosis not present

## 2020-02-07 DIAGNOSIS — J449 Chronic obstructive pulmonary disease, unspecified: Secondary | ICD-10-CM | POA: Insufficient documentation

## 2020-02-07 LAB — CBC WITH DIFFERENTIAL (CANCER CENTER ONLY)
Abs Immature Granulocytes: 0.03 10*3/uL (ref 0.00–0.07)
Basophils Absolute: 0.1 10*3/uL (ref 0.0–0.1)
Basophils Relative: 1 %
Eosinophils Absolute: 0 10*3/uL (ref 0.0–0.5)
Eosinophils Relative: 1 %
HCT: 36.8 % — ABNORMAL LOW (ref 39.0–52.0)
Hemoglobin: 12.5 g/dL — ABNORMAL LOW (ref 13.0–17.0)
Immature Granulocytes: 0 %
Lymphocytes Relative: 23 %
Lymphs Abs: 1.8 10*3/uL (ref 0.7–4.0)
MCH: 33 pg (ref 26.0–34.0)
MCHC: 34 g/dL (ref 30.0–36.0)
MCV: 97.1 fL (ref 80.0–100.0)
Monocytes Absolute: 1 10*3/uL (ref 0.1–1.0)
Monocytes Relative: 12 %
Neutro Abs: 4.8 10*3/uL (ref 1.7–7.7)
Neutrophils Relative %: 63 %
Platelet Count: 282 10*3/uL (ref 150–400)
RBC: 3.79 MIL/uL — ABNORMAL LOW (ref 4.22–5.81)
RDW: 14.9 % (ref 11.5–15.5)
WBC Count: 7.7 10*3/uL (ref 4.0–10.5)
nRBC: 0 % (ref 0.0–0.2)

## 2020-02-07 LAB — CMP (CANCER CENTER ONLY)
ALT: 17 U/L (ref 0–44)
AST: 22 U/L (ref 15–41)
Albumin: 3.5 g/dL (ref 3.5–5.0)
Alkaline Phosphatase: 91 U/L (ref 38–126)
Anion gap: 11 (ref 5–15)
BUN: 23 mg/dL (ref 8–23)
CO2: 25 mmol/L (ref 22–32)
Calcium: 9.6 mg/dL (ref 8.9–10.3)
Chloride: 101 mmol/L (ref 98–111)
Creatinine: 1.62 mg/dL — ABNORMAL HIGH (ref 0.61–1.24)
GFR, Est AFR Am: 49 mL/min — ABNORMAL LOW (ref >60)
GFR, Estimated: 42 mL/min — ABNORMAL LOW (ref >60)
Glucose, Bld: 127 mg/dL — ABNORMAL HIGH (ref 70–99)
Potassium: 4.3 mmol/L (ref 3.5–5.1)
Sodium: 137 mmol/L (ref 135–145)
Total Bilirubin: 0.5 mg/dL (ref 0.3–1.2)
Total Protein: 6.9 g/dL (ref 6.5–8.1)

## 2020-02-07 MED ORDER — SODIUM CHLORIDE 0.9 % IV SOLN
150.0000 mg | Freq: Once | INTRAVENOUS | Status: AC
  Administered 2020-02-07: 150 mg via INTRAVENOUS
  Filled 2020-02-07: qty 150

## 2020-02-07 MED ORDER — SODIUM CHLORIDE 0.9 % IV SOLN
419.5000 mg | Freq: Once | INTRAVENOUS | Status: AC
  Administered 2020-02-07: 420 mg via INTRAVENOUS
  Filled 2020-02-07: qty 42

## 2020-02-07 MED ORDER — PALONOSETRON HCL INJECTION 0.25 MG/5ML
0.2500 mg | Freq: Once | INTRAVENOUS | Status: AC
  Administered 2020-02-07: 0.25 mg via INTRAVENOUS

## 2020-02-07 MED ORDER — PALONOSETRON HCL INJECTION 0.25 MG/5ML
INTRAVENOUS | Status: AC
  Filled 2020-02-07: qty 5

## 2020-02-07 MED ORDER — SODIUM CHLORIDE 0.9 % IV SOLN
Freq: Once | INTRAVENOUS | Status: AC
  Filled 2020-02-07: qty 250

## 2020-02-07 MED ORDER — SODIUM CHLORIDE 0.9 % IV SOLN
100.0000 mg/m2 | Freq: Once | INTRAVENOUS | Status: AC
  Administered 2020-02-07: 220 mg via INTRAVENOUS
  Filled 2020-02-07: qty 11

## 2020-02-07 MED ORDER — SODIUM CHLORIDE 0.9 % IV SOLN
10.0000 mg | Freq: Once | INTRAVENOUS | Status: AC
  Administered 2020-02-07: 10 mg via INTRAVENOUS
  Filled 2020-02-07: qty 10

## 2020-02-07 NOTE — Progress Notes (Unsigned)
Per Dr. Julien Nordmann, ok to treat with Scr 1.62.

## 2020-02-07 NOTE — Progress Notes (Signed)
[place] [phone]   [phone]) [phone]  OFFICE PROGRESS NOTE  Luetta Nutting, DO [place] [address]  DIAGNOSIS: limited stage, stage Ia (T1b, N0, M0) small cell lung cancer presented with right upper lobe pulmonary nodule that is hypermetabolic and biopsy proven to be consistent with small cell carcinoma diagnosed in April 2021.  PRIOR THERAPY: None  CURRENT THERAPY: Concurrent chemoradiation with carboplatin for an AUC of 5 on days 1 and etoposide 100 mg per metered squared on days 1, 2, and 3 IV every 3 weeks.  First dose received on 01/10/2020.   Status post 1 cycle of treatment.  INTERVAL HISTORY: [name] 74 y.o. male returns to the clinic today for follow-up visit.  The patient tolerated the first cycle of his treatment fairly well with no concerning adverse effects except for fatigue on the second week after treatment.  His white blood count significantly decreased after the treatment and the patient was treated with Granix.  He denied having any current chest pain, shortness of breath, cough or hemoptysis.  He denied having any fever or chills.  He has no nausea, vomiting, diarrhea or constipation.  He denied having any headache or visual changes.  He is here today for evaluation before starting cycle #2.  MEDICAL HISTORY: Past Medical History:  Diagnosis Date  . Atypical atrial flutter ([place])   . Cancer [facility])    Testicular- surgery   . COPD (chronic obstructive pulmonary disease) ([place])   . Coronary artery disease   . Diastolic dysfunction, left ventricle 05/2018   Dr. Rayann Heman- Cardiologist  . Dysrhythmia    chronic AFib  . History of kidney stones    passed  . HLD (hyperlipidemia)   . HTN (hypertension)   . Myocardial infarction ([place])    x 2 maybe 1 more  . OSA (obstructive sleep apnea)    uses CPAP  . Peripheral artery disease ([place])    pseudoaneurysm post afib ablation at [facility] 2011,  s/p bilateral iliac stents  . Persistent atrial fibrillation ([place])   . Pre-diabetes   . Renal artery stenosis ([facility])    right renal artery PTA and stenting  . S/P coronary artery bypass graft x 7 11/16/96    ALLERGIES:  has No Known Allergies.  MEDICATIONS:  Current Outpatient Medications  Medication Sig Dispense Refill  . acetaminophen (TYLENOL) 500 MG tablet Take 1,000 mg every 6 (six) hours as needed by mouth (for pain.).    [name] atorvastatin (LIPITOR) 10 MG tablet TAKE 1 TABLET(10 MG) BY MOUTH DAILY (Patient taking differently: Take 10 mg by mouth every evening. TAKE 1 TABLET(10 MG) BY MOUTH DAILY) 90 tablet 2  . budesonide-formoterol (SYMBICORT) 160-4.5 MCG/ACT inhaler Inhale 2 puffs into the lungs 2 (two) times daily. 10.2 g 3  . cetirizine (ZYRTEC) 10 MG tablet Take 10 mg by mouth daily.    [name] dronedarone (MULTAQ) 400 MG tablet TAKE 1 TABLET BY MOUTH TWICE DAILY WITH MEAL 180 tablet 1  . empagliflozin (JARDIANCE) 10 MG TABS tablet Take 10 mg by mouth daily. Schedule appt with PCP 90 tablet 2  . furosemide (LASIX) 40 MG tablet TAKE 1 TABLET BY MOUTH AS NEEDED (Patient taking differently: Take 40 mg by mouth daily as needed for fluid. ) 30 tablet 3  . gabapentin (NEURONTIN) 300 MG capsule Take 2 capsules (600 mg total) by mouth at bedtime. 180 capsule 2  . ketotifen (ZADITOR) 0.025 %  ophthalmic solution Place 1 drop into both eyes 2 (two) times daily as needed (allergies).    [name] lisinopril-hydrochlorothiazide (ZESTORETIC) 10-12.5 MG tablet TAKE 1 TABLET BY MOUTH DAILY (Patient taking differently: Take 1 tablet by mouth every evening. ) 90 tablet 1  . metoprolol succinate (TOPROL-XL) 100 MG 24 hr tablet TAKE 1 TABLET(100 MG) BY MOUTH DAILY (Patient taking differently: Take 100 mg by mouth every evening. TAKE 1 TABLET(100 MG) BY MOUTH DAILY) 90 tablet 3  . Multiple Vitamin (MULTIVITAMIN WITH MINERALS) TABS tablet Take 1 tablet by mouth daily. In the morning.    . niacin (NIASPAN) 1000 MG CR  tablet TAKE 1 TABLET(1000 MG) BY MOUTH AT BEDTIME (Patient taking differently: Take 1,000 mg by mouth at bedtime. ) 90 tablet 3  . nicotine polacrilex (COMMIT) 2 MG lozenge Take 2 mg by mouth as needed for smoking cessation.    . NON FORMULARY CPAP at night    . prochlorperazine (COMPAZINE) 10 MG tablet Take 1 tablet (10 mg total) by mouth every 6 (six) hours as needed for nausea or vomiting. 30 tablet 0  . traMADol (ULTRAM) 50 MG tablet Take 1 tablet (50 mg total) by mouth every 12 (twelve) hours as needed. 30 tablet 0  . traZODone (DESYREL) 50 MG tablet TAKE 1/2 TO 1 TABLET BY MOUTH AT BEDTIME AS NEEDED FOR SLEEP (Patient taking differently: Take 50 mg by mouth at bedtime. ) 90 tablet 3  . VENTOLIN HFA 108 (90 Base) MCG/ACT inhaler INHALE 2 PUFFS INTO THE LUNGS EVERY 6 HOURS AS NEEDED FOR WHEEZING OR SHORTNESS OF BREATH (Patient taking differently: Inhale 2 puffs into the lungs every 6 (six) hours as needed (wheezing/shortness of breath.). ) 18 g 1  . XARELTO 20 MG TABS tablet TAKE 1 TABLET BY MOUTH EVERY DAY WITH SUPPER (Patient taking differently: Take 20 mg by mouth every evening. ) 90 tablet 1   No current facility-administered medications for this visit.    SURGICAL HISTORY:  Past Surgical History:  Procedure Laterality Date  . 2-D echocardiogram  03/25/2010   Normal left ventricular function. Mild MR, TR, trivial AR  . ATRIAL FIBRILLATION ABLATION  03/27/2010, 12/31/2010   [facility], Dr. Nadeen Landau  . BACK SURGERY  2004, 2007   Laminectomy, Discedectomy x 2  . BRONCHIAL BIOPSY  12/27/2019   Procedure: BRONCHIAL BIOPSIES;  Surgeon: Garner Nash, DO;  Location: [place] ENDOSCOPY;  Service: Pulmonary;;  . BRONCHIAL BRUSHINGS  12/27/2019   Procedure: BRONCHIAL BRUSHINGS;  Surgeon: Garner Nash, DO;  Location: [place];  Service: Pulmonary;;  . BRONCHIAL NEEDLE ASPIRATION BIOPSY  12/27/2019   Procedure: BRONCHIAL NEEDLE ASPIRATION BIOPSIES;  Surgeon: Garner Nash, DO;  Location: [facility]  ENDOSCOPY;  Service: Pulmonary;;  . cardiac stress test  11/21/2009   Exercise capacity 5 METS. No significant ischemia demonstrated  . CARDIOVERSION N/A 03/15/2015   Procedure: CARDIOVERSION;  Surgeon: Lorretta Harp, MD;  [facility] ENDOSCOPY;  Service: Cardiovascular;  Laterality: N/A;  . CARDIOVERSION N/A 07/10/2017   Procedure: CARDIOVERSION;  Surgeon: Pixie Casino, MD;  Location[facility] ENDOSCOPY;  Service: Cardiovascular;  Laterality: N/A;  . CARDIOVERSION N/A 03/23/2018   Procedure: CARDIOVERSION;  Surgeon: Sanda Klein, MD;  Location: [facility] ENDOSCOPY;  Service: Cardiovascular;  Laterality: N/A;  . CORONARY ARTERY BYPASS GRAFT  1998  . ELECTROMAGNETIC NAVIGATION BROCHOSCOPY  12/27/2019   Procedure: NAVIGATION BRONCHOSCOPY;  Surgeon: Garner Nash, DO;  Location: [place] ENDOSCOPY;  Service: Pulmonary;;  . FIDUCIAL MARKER PLACEMENT  12/27/2019  Procedure: FIDUCIAL MARKER PLACEMENT;  Surgeon: Garner Nash, DO;  Location: [place] ENDOSCOPY;  Service: Pulmonary;;  . ORCHIECTOMY  1981   for cancer  . TOOTH EXTRACTION  05/27/2013   tooth extraction with bone graft- several -for Implants  . VIDEO BRONCHOSCOPY WITH ENDOBRONCHIAL NAVIGATION N/A 12/27/2019   Procedure: VIDEO BRONCHOSCOPY;  Surgeon: Garner Nash, DO;  Location: [place];  Service: Pulmonary;  Laterality: N/A;    REVIEW OF SYSTEMS:  A comprehensive review of systems was negative except for: Constitutional: positive for fatigue   PHYSICAL EXAMINATION: General appearance: alert, cooperative, fatigued and no distress Head: Normocephalic, without obvious abnormality, atraumatic Neck: no adenopathy, no JVD, supple, symmetrical, trachea midline and thyroid not enlarged, symmetric, no tenderness/mass/nodules Lymph nodes: Cervical, supraclavicular, and axillary nodes normal. Resp: clear to auscultation bilaterally Back: symmetric, no curvature. ROM normal. No CVA tenderness. Cardio: regular rate and rhythm, S1, S2 normal, no  murmur, click, rub or gallop GI: soft, non-tender; bowel sounds normal; no masses,  no organomegaly Extremities: extremities normal, atraumatic, no cyanosis or edema  ECOG PERFORMANCE STATUS: 1 - Symptomatic but completely ambulatory  Blood pressure (!) 95/22, pulse 65, temperature 97.6 F (36.4 C), temperature source Temporal, resp. rate 17, height 6' (1.829 m), weight 217 lb (98.4 kg), SpO2 100 %.  LABORATORY DATA: Lab Results  Component Value Date   WBC 7.7 02/07/2020   HGB 12.5 (L) 02/07/2020   HCT 36.8 (L) 02/07/2020   MCV 97.1 02/07/2020   PLT 282 02/07/2020      Chemistry      Component Value Date/Time   NA 135 01/24/2020 1119   NA 142 04/18/2010 0000   K 4.5 01/24/2020 1119   CL 97 (L) 01/24/2020 1119   CO2 30 01/24/2020 1119   BUN 16 01/24/2020 1119   CREATININE 1.59 (H) 01/24/2020 1119   CREATININE 1.41 (H) 09/14/2019 0943      Component Value Date/Time   CALCIUM 10.1 01/24/2020 1119   ALKPHOS 129 (H) 01/24/2020 1119   AST 20 01/24/2020 1119   ALT 31 01/24/2020 1119   BILITOT 1.2 01/24/2020 1119       RADIOGRAPHIC STUDIES: MR BRAIN W WO CONTRAST  Result Date: 01/25/2020 CLINICAL DATA:  Small-cell lung cancer. Staging. EXAM: MRI HEAD WITHOUT AND WITH CONTRAST TECHNIQUE: Multiplanar, multiecho pulse sequences of the brain and surrounding structures were obtained without and with intravenous contrast. CONTRAST:  49mL GADAVIST GADOBUTROL 1 MMOL/ML IV SOLN COMPARISON:  None. FINDINGS: Brain: No acute infarction, hemorrhage, hydrocephalus, extra-axial collection or mass lesion. Few scattered foci of T2 hyperintensity are seen within the white matter of the cerebral hemispheres, nonspecific. No focus of abnormal contrast enhancement identified. Vascular: Normal flow voids. Skull and upper cervical spine: Normal marrow signal. Sinuses/Orbits: Negative. IMPRESSION: 1. No evidence of intracranial metastatic disease. 2. Few scattered foci of T2 hyperintensity within the  white matter of the cerebral hemispheres, nonspecific, may represent early microangiopathic changes. Electronically Signed   By: [name] M.D.   On: 01/25/2020 15:08    ASSESSMENT AND PLAN: This is a very pleasant 74 years old white male with limited stage, stage Ia small cell lung cancer presented with right upper lobe pulmonary nodule diagnosed in April 2021. The patient is undergoing a course of systemic chemotherapy with carboplatin and etoposide in addition to SBRT to the right upper lobe lung nodule. He tolerated the first cycle of his treatment well with no concerning adverse effects. I recommended for the patient to proceed  with cycle #2 today as planned. He will have Udenyca after chemotherapy because of the significant neutropenia with the first cycle of his treatment. The patient will come back for follow-up visit in 3 weeks for evaluation before the next cycle of his treatment. He was advised to call immediately if he has any concerning symptoms in the interval. The patient voices understanding of current disease status and treatment options and is in agreement with the current care plan.  All questions were answered. The patient knows to call the clinic with any problems, questions or concerns. We can certainly see the patient much sooner if necessary.  Disclaimer: This note was dictated with voice recognition software. Similar sounding words can inadvertently be transcribed and may not be corrected upon review.

## 2020-02-07 NOTE — Telephone Encounter (Signed)
PA request was received from (pharmacy): [facility] Phone:[phone] Medication name and strength: Budesonide/Form 160/5.5 mcg (120 inh) Ordering Provider: Dr. Vaughan Browner  Was PA started with [facility]?: yes If yes, please enter KEY: [id number] Medication tried and failed: None Covered Alternatives: Advair 115-21 mcg or Dulera 200-5 mcg  PA sent to plan, time frame for approval / denial: 3 business days Routing to [place] for follow-up

## 2020-02-07 NOTE — Patient Instructions (Signed)
[place] Discharge Instructions for Patients Receiving Chemotherapy  Today you received the following chemotherapy agents: Carbo and Etoposide (VEPESID).  To help prevent nausea and vomiting after your treatment, we encourage you to take your nausea medication as prescribed.    If you develop nausea and vomiting that is not controlled by your nausea medication, call the clinic.   BELOW ARE SYMPTOMS THAT SHOULD BE REPORTED IMMEDIATELY:  *FEVER GREATER THAN 100.5 F  *CHILLS WITH OR WITHOUT FEVER  NAUSEA AND VOMITING THAT IS NOT CONTROLLED WITH YOUR NAUSEA MEDICATION  *UNUSUAL SHORTNESS OF BREATH  *UNUSUAL BRUISING OR BLEEDING  TENDERNESS IN MOUTH AND THROAT WITH OR WITHOUT PRESENCE OF ULCERS  *URINARY PROBLEMS  *BOWEL PROBLEMS  UNUSUAL RASH Items with * indicate a potential emergency and should be followed up as soon as possible.  Feel free to call the clinic should you have any questions or concerns. The clinic phone number is [phone].  Please show the Page at check-in to the Emergency Department and triage nurse.

## 2020-02-08 ENCOUNTER — Telehealth: Payer: Self-pay | Admitting: Internal Medicine

## 2020-02-08 ENCOUNTER — Inpatient Hospital Stay: Payer: Medicare Other

## 2020-02-08 ENCOUNTER — Other Ambulatory Visit: Payer: Self-pay

## 2020-02-08 VITALS — BP 100/52 | HR 67 | Temp 98.8°F | Resp 18

## 2020-02-08 DIAGNOSIS — C3411 Malignant neoplasm of upper lobe, right bronchus or lung: Secondary | ICD-10-CM | POA: Diagnosis not present

## 2020-02-08 DIAGNOSIS — Z7689 Persons encountering health services in other specified circumstances: Secondary | ICD-10-CM | POA: Diagnosis not present

## 2020-02-08 DIAGNOSIS — I2510 Atherosclerotic heart disease of native coronary artery without angina pectoris: Secondary | ICD-10-CM | POA: Diagnosis not present

## 2020-02-08 DIAGNOSIS — Z79899 Other long term (current) drug therapy: Secondary | ICD-10-CM | POA: Diagnosis not present

## 2020-02-08 DIAGNOSIS — Z87442 Personal history of urinary calculi: Secondary | ICD-10-CM | POA: Diagnosis not present

## 2020-02-08 DIAGNOSIS — J449 Chronic obstructive pulmonary disease, unspecified: Secondary | ICD-10-CM | POA: Diagnosis not present

## 2020-02-08 DIAGNOSIS — I252 Old myocardial infarction: Secondary | ICD-10-CM | POA: Diagnosis not present

## 2020-02-08 DIAGNOSIS — I739 Peripheral vascular disease, unspecified: Secondary | ICD-10-CM | POA: Diagnosis not present

## 2020-02-08 DIAGNOSIS — Z7901 Long term (current) use of anticoagulants: Secondary | ICD-10-CM | POA: Diagnosis not present

## 2020-02-08 DIAGNOSIS — I10 Essential (primary) hypertension: Secondary | ICD-10-CM | POA: Diagnosis not present

## 2020-02-08 DIAGNOSIS — Z5112 Encounter for antineoplastic immunotherapy: Secondary | ICD-10-CM | POA: Diagnosis not present

## 2020-02-08 DIAGNOSIS — E785 Hyperlipidemia, unspecified: Secondary | ICD-10-CM | POA: Diagnosis not present

## 2020-02-08 MED ORDER — SODIUM CHLORIDE 0.9 % IV SOLN
100.0000 mg/m2 | Freq: Once | INTRAVENOUS | Status: AC
  Administered 2020-02-08: 220 mg via INTRAVENOUS
  Filled 2020-02-08: qty 11

## 2020-02-08 MED ORDER — SODIUM CHLORIDE 0.9 % IV SOLN
10.0000 mg | Freq: Once | INTRAVENOUS | Status: AC
  Administered 2020-02-08: 10 mg via INTRAVENOUS
  Filled 2020-02-08: qty 10

## 2020-02-08 MED ORDER — SODIUM CHLORIDE 0.9 % IV SOLN
Freq: Once | INTRAVENOUS | Status: AC
  Filled 2020-02-08: qty 250

## 2020-02-08 NOTE — Telephone Encounter (Signed)
Scheduled appt per 6/8 los - pt to get an updated schedule next visit.

## 2020-02-08 NOTE — Patient Instructions (Signed)
[facility] Discharge Instructions for Patients Receiving Chemotherapy  Today you received the following chemotherapy agents: Etoposide (VEPESID).  To help prevent nausea and vomiting after your treatment, we encourage you to take your nausea medication as prescribed.   If you develop nausea and vomiting that is not controlled by your nausea medication, call the clinic.   BELOW ARE SYMPTOMS THAT SHOULD BE REPORTED IMMEDIATELY:  *FEVER GREATER THAN 100.5 F  *CHILLS WITH OR WITHOUT FEVER  NAUSEA AND VOMITING THAT IS NOT CONTROLLED WITH YOUR NAUSEA MEDICATION  *UNUSUAL SHORTNESS OF BREATH  *UNUSUAL BRUISING OR BLEEDING  TENDERNESS IN MOUTH AND THROAT WITH OR WITHOUT PRESENCE OF ULCERS  *URINARY PROBLEMS  *BOWEL PROBLEMS  UNUSUAL RASH Items with * indicate a potential emergency and should be followed up as soon as possible.  Feel free to call the clinic should you have any questions or concerns. The clinic phone number is [phone].  Please show the CHEMO ALERT CARD at check-in to the Emergency Department and triage nurse.   

## 2020-02-09 ENCOUNTER — Inpatient Hospital Stay: Payer: Medicare Other

## 2020-02-09 ENCOUNTER — Other Ambulatory Visit: Payer: Self-pay

## 2020-02-09 VITALS — BP 111/56 | HR 69 | Temp 98.7°F | Resp 18

## 2020-02-09 DIAGNOSIS — Z7901 Long term (current) use of anticoagulants: Secondary | ICD-10-CM | POA: Diagnosis not present

## 2020-02-09 DIAGNOSIS — I10 Essential (primary) hypertension: Secondary | ICD-10-CM | POA: Diagnosis not present

## 2020-02-09 DIAGNOSIS — E785 Hyperlipidemia, unspecified: Secondary | ICD-10-CM | POA: Diagnosis not present

## 2020-02-09 DIAGNOSIS — I252 Old myocardial infarction: Secondary | ICD-10-CM | POA: Diagnosis not present

## 2020-02-09 DIAGNOSIS — Z5112 Encounter for antineoplastic immunotherapy: Secondary | ICD-10-CM | POA: Diagnosis not present

## 2020-02-09 DIAGNOSIS — J449 Chronic obstructive pulmonary disease, unspecified: Secondary | ICD-10-CM | POA: Diagnosis not present

## 2020-02-09 DIAGNOSIS — Z7689 Persons encountering health services in other specified circumstances: Secondary | ICD-10-CM | POA: Diagnosis not present

## 2020-02-09 DIAGNOSIS — C3411 Malignant neoplasm of upper lobe, right bronchus or lung: Secondary | ICD-10-CM | POA: Diagnosis not present

## 2020-02-09 DIAGNOSIS — I2510 Atherosclerotic heart disease of native coronary artery without angina pectoris: Secondary | ICD-10-CM | POA: Diagnosis not present

## 2020-02-09 DIAGNOSIS — Z87442 Personal history of urinary calculi: Secondary | ICD-10-CM | POA: Diagnosis not present

## 2020-02-09 DIAGNOSIS — I739 Peripheral vascular disease, unspecified: Secondary | ICD-10-CM | POA: Diagnosis not present

## 2020-02-09 DIAGNOSIS — Z79899 Other long term (current) drug therapy: Secondary | ICD-10-CM | POA: Diagnosis not present

## 2020-02-09 MED ORDER — SODIUM CHLORIDE 0.9 % IV SOLN
Freq: Once | INTRAVENOUS | Status: AC
  Filled 2020-02-09: qty 250

## 2020-02-09 MED ORDER — SODIUM CHLORIDE 0.9 % IV SOLN
100.0000 mg/m2 | Freq: Once | INTRAVENOUS | Status: AC
  Administered 2020-02-09: 220 mg via INTRAVENOUS
  Filled 2020-02-09: qty 11

## 2020-02-09 MED ORDER — SODIUM CHLORIDE 0.9 % IV SOLN
10.0000 mg | Freq: Once | INTRAVENOUS | Status: AC
  Administered 2020-02-09: 10 mg via INTRAVENOUS
  Filled 2020-02-09: qty 10

## 2020-02-09 NOTE — Telephone Encounter (Signed)
Checked PA on CMM. Budesonide- Formoterol PA approved through 02/06/2021.  [facility] pharmacy and Patient notified of approval. Nothing further at this time.     Farrell Mcniel Key: [id number] Need help? Call us at [phone]  Outcome  Approved on June 8  Effective from 02/07/2020 through 02/06/2021.  DrugBudesonide-Formoterol Fumarate 160-4.5MCG/ACT aerosol  [employer] [place] Medicare Part D General Authorization Form

## 2020-02-09 NOTE — Patient Instructions (Signed)
[facility] Discharge Instructions for Patients Receiving Chemotherapy  Today you received the following chemotherapy agents: etoposide  To help prevent nausea and vomiting after your treatment, we encourage you to take your nausea medication as directed.   If you develop nausea and vomiting that is not controlled by your nausea medication, call the clinic.   BELOW ARE SYMPTOMS THAT SHOULD BE REPORTED IMMEDIATELY:  *FEVER GREATER THAN 100.5 F  *CHILLS WITH OR WITHOUT FEVER  NAUSEA AND VOMITING THAT IS NOT CONTROLLED WITH YOUR NAUSEA MEDICATION  *UNUSUAL SHORTNESS OF BREATH  *UNUSUAL BRUISING OR BLEEDING  TENDERNESS IN MOUTH AND THROAT WITH OR WITHOUT PRESENCE OF ULCERS  *URINARY PROBLEMS  *BOWEL PROBLEMS  UNUSUAL RASH Items with * indicate a potential emergency and should be followed up as soon as possible.  Feel free to call the clinic should you have any questions or concerns. The clinic phone number is [phone].  Please show the CHEMO ALERT CARD at check-in to the Emergency Department and triage nurse.   

## 2020-02-11 ENCOUNTER — Other Ambulatory Visit: Payer: Self-pay

## 2020-02-11 ENCOUNTER — Inpatient Hospital Stay: Payer: Medicare Other

## 2020-02-11 VITALS — BP 99/55 | HR 81 | Temp 98.4°F | Resp 18

## 2020-02-11 DIAGNOSIS — E785 Hyperlipidemia, unspecified: Secondary | ICD-10-CM | POA: Diagnosis not present

## 2020-02-11 DIAGNOSIS — C3411 Malignant neoplasm of upper lobe, right bronchus or lung: Secondary | ICD-10-CM | POA: Diagnosis not present

## 2020-02-11 DIAGNOSIS — Z7901 Long term (current) use of anticoagulants: Secondary | ICD-10-CM | POA: Diagnosis not present

## 2020-02-11 DIAGNOSIS — Z87442 Personal history of urinary calculi: Secondary | ICD-10-CM | POA: Diagnosis not present

## 2020-02-11 DIAGNOSIS — Z79899 Other long term (current) drug therapy: Secondary | ICD-10-CM | POA: Diagnosis not present

## 2020-02-11 DIAGNOSIS — J449 Chronic obstructive pulmonary disease, unspecified: Secondary | ICD-10-CM | POA: Diagnosis not present

## 2020-02-11 DIAGNOSIS — I2510 Atherosclerotic heart disease of native coronary artery without angina pectoris: Secondary | ICD-10-CM | POA: Diagnosis not present

## 2020-02-11 DIAGNOSIS — I10 Essential (primary) hypertension: Secondary | ICD-10-CM | POA: Diagnosis not present

## 2020-02-11 DIAGNOSIS — Z7689 Persons encountering health services in other specified circumstances: Secondary | ICD-10-CM | POA: Diagnosis not present

## 2020-02-11 DIAGNOSIS — I739 Peripheral vascular disease, unspecified: Secondary | ICD-10-CM | POA: Diagnosis not present

## 2020-02-11 DIAGNOSIS — Z5112 Encounter for antineoplastic immunotherapy: Secondary | ICD-10-CM | POA: Diagnosis not present

## 2020-02-11 DIAGNOSIS — I252 Old myocardial infarction: Secondary | ICD-10-CM | POA: Diagnosis not present

## 2020-02-11 MED ORDER — PEGFILGRASTIM-JMDB 6 MG/0.6ML ~~LOC~~ SOSY
6.0000 mg | PREFILLED_SYRINGE | Freq: Once | SUBCUTANEOUS | Status: AC
  Administered 2020-02-11: 6 mg via SUBCUTANEOUS

## 2020-02-14 ENCOUNTER — Other Ambulatory Visit: Payer: Self-pay

## 2020-02-14 ENCOUNTER — Inpatient Hospital Stay: Payer: Medicare Other

## 2020-02-14 DIAGNOSIS — C3411 Malignant neoplasm of upper lobe, right bronchus or lung: Secondary | ICD-10-CM | POA: Diagnosis not present

## 2020-02-14 DIAGNOSIS — C3491 Malignant neoplasm of unspecified part of right bronchus or lung: Secondary | ICD-10-CM

## 2020-02-14 LAB — CBC WITH DIFFERENTIAL (CANCER CENTER ONLY)
Abs Immature Granulocytes: 0 10*3/uL (ref 0.00–0.07)
Basophils Absolute: 0.1 10*3/uL (ref 0.0–0.1)
Basophils Relative: 1 %
Eosinophils Absolute: 0.1 10*3/uL (ref 0.0–0.5)
Eosinophils Relative: 1 %
HCT: 33.8 % — ABNORMAL LOW (ref 39.0–52.0)
Hemoglobin: 11.3 g/dL — ABNORMAL LOW (ref 13.0–17.0)
Immature Granulocytes: 0 %
Lymphocytes Relative: 8 %
Lymphs Abs: 0.9 10*3/uL (ref 0.7–4.0)
MCH: 33 pg (ref 26.0–34.0)
MCHC: 33.4 g/dL (ref 30.0–36.0)
MCV: 98.8 fL (ref 80.0–100.0)
Monocytes Absolute: 0.2 10*3/uL (ref 0.1–1.0)
Monocytes Relative: 2 %
Neutro Abs: 9.8 10*3/uL — ABNORMAL HIGH (ref 1.7–7.7)
Neutrophils Relative %: 88 %
Platelet Count: 105 10*3/uL — ABNORMAL LOW (ref 150–400)
RBC: 3.42 MIL/uL — ABNORMAL LOW (ref 4.22–5.81)
RDW: 14.9 % (ref 11.5–15.5)
WBC Count: 11.1 10*3/uL — ABNORMAL HIGH (ref 4.0–10.5)
nRBC: 0 % (ref 0.0–0.2)

## 2020-02-14 LAB — CMP (CANCER CENTER ONLY)
ALT: 23 U/L (ref 0–44)
AST: 20 U/L (ref 15–41)
Albumin: 3.7 g/dL (ref 3.5–5.0)
Alkaline Phosphatase: 111 U/L (ref 38–126)
Anion gap: 9 (ref 5–15)
BUN: 34 mg/dL — ABNORMAL HIGH (ref 8–23)
CO2: 31 mmol/L (ref 22–32)
Calcium: 9.9 mg/dL (ref 8.9–10.3)
Chloride: 98 mmol/L (ref 98–111)
Creatinine: 1.26 mg/dL — ABNORMAL HIGH (ref 0.61–1.24)
GFR, Est AFR Am: >60 mL/min (ref >60)
GFR, Estimated: 57 mL/min — ABNORMAL LOW (ref >60)
Glucose, Bld: 148 mg/dL — ABNORMAL HIGH (ref 70–99)
Potassium: 5 mmol/L (ref 3.5–5.1)
Sodium: 138 mmol/L (ref 135–145)
Total Bilirubin: 1.8 mg/dL — ABNORMAL HIGH (ref 0.3–1.2)
Total Protein: 6.7 g/dL (ref 6.5–8.1)

## 2020-02-21 ENCOUNTER — Ambulatory Visit: Payer: Medicare Other

## 2020-02-21 ENCOUNTER — Other Ambulatory Visit: Payer: Self-pay

## 2020-02-21 ENCOUNTER — Other Ambulatory Visit: Payer: Medicare Other

## 2020-02-21 ENCOUNTER — Ambulatory Visit: Payer: Medicare Other | Admitting: Internal Medicine

## 2020-02-21 ENCOUNTER — Inpatient Hospital Stay: Payer: Medicare Other

## 2020-02-21 DIAGNOSIS — C3411 Malignant neoplasm of upper lobe, right bronchus or lung: Secondary | ICD-10-CM | POA: Diagnosis not present

## 2020-02-21 DIAGNOSIS — C3491 Malignant neoplasm of unspecified part of right bronchus or lung: Secondary | ICD-10-CM

## 2020-02-21 LAB — CMP (CANCER CENTER ONLY)
ALT: 26 U/L (ref 0–44)
AST: 15 U/L (ref 15–41)
Albumin: 3.5 g/dL (ref 3.5–5.0)
Alkaline Phosphatase: 133 U/L — ABNORMAL HIGH (ref 38–126)
Anion gap: 9 (ref 5–15)
BUN: 17 mg/dL (ref 8–23)
CO2: 28 mmol/L (ref 22–32)
Calcium: 9.6 mg/dL (ref 8.9–10.3)
Chloride: 100 mmol/L (ref 98–111)
Creatinine: 1.41 mg/dL — ABNORMAL HIGH (ref 0.61–1.24)
GFR, Est AFR Am: 58 mL/min — ABNORMAL LOW (ref >60)
GFR, Estimated: 50 mL/min — ABNORMAL LOW (ref >60)
Glucose, Bld: 177 mg/dL — ABNORMAL HIGH (ref 70–99)
Potassium: 4.1 mmol/L (ref 3.5–5.1)
Sodium: 137 mmol/L (ref 135–145)
Total Bilirubin: 0.4 mg/dL (ref 0.3–1.2)
Total Protein: 6.4 g/dL — ABNORMAL LOW (ref 6.5–8.1)

## 2020-02-21 LAB — CBC WITH DIFFERENTIAL (CANCER CENTER ONLY)
Abs Immature Granulocytes: 0.23 10*3/uL — ABNORMAL HIGH (ref 0.00–0.07)
Basophils Absolute: 0 10*3/uL (ref 0.0–0.1)
Basophils Relative: 0 %
Eosinophils Absolute: 0.1 10*3/uL (ref 0.0–0.5)
Eosinophils Relative: 1 %
HCT: 30.8 % — ABNORMAL LOW (ref 39.0–52.0)
Hemoglobin: 10.3 g/dL — ABNORMAL LOW (ref 13.0–17.0)
Immature Granulocytes: 2 %
Lymphocytes Relative: 17 %
Lymphs Abs: 1.7 10*3/uL (ref 0.7–4.0)
MCH: 34.2 pg — ABNORMAL HIGH (ref 26.0–34.0)
MCHC: 33.4 g/dL (ref 30.0–36.0)
MCV: 102.3 fL — ABNORMAL HIGH (ref 80.0–100.0)
Monocytes Absolute: 0.9 10*3/uL (ref 0.1–1.0)
Monocytes Relative: 9 %
Neutro Abs: 7.1 10*3/uL (ref 1.7–7.7)
Neutrophils Relative %: 71 %
Platelet Count: 66 10*3/uL — ABNORMAL LOW (ref 150–400)
RBC: 3.01 MIL/uL — ABNORMAL LOW (ref 4.22–5.81)
RDW: 15.7 % — ABNORMAL HIGH (ref 11.5–15.5)
WBC Count: 10.2 10*3/uL (ref 4.0–10.5)
nRBC: 0.2 % (ref 0.0–0.2)

## 2020-02-21 NOTE — Telephone Encounter (Signed)
Checked covermymeds online and the PA was Approved on June 8 Effective from 02/07/2020 through 02/06/2021.  [employer], unable to get an answer.  Called the patient and left a VM letting him know that the PA had been approved.  Advised to call the pharmacy to verify if it is ready.

## 2020-02-22 ENCOUNTER — Ambulatory Visit: Payer: Medicare Other

## 2020-02-23 ENCOUNTER — Ambulatory Visit: Payer: Medicare Other

## 2020-02-25 ENCOUNTER — Ambulatory Visit: Payer: Medicare Other

## 2020-02-28 ENCOUNTER — Inpatient Hospital Stay: Payer: Medicare Other

## 2020-02-28 ENCOUNTER — Encounter: Payer: Self-pay | Admitting: Internal Medicine

## 2020-02-28 ENCOUNTER — Other Ambulatory Visit: Payer: Self-pay

## 2020-02-28 ENCOUNTER — Inpatient Hospital Stay: Payer: Medicare Other | Admitting: Internal Medicine

## 2020-02-28 VITALS — BP 103/50 | HR 85 | Temp 98.1°F | Resp 20 | Ht 72.0 in | Wt 215.7 lb

## 2020-02-28 DIAGNOSIS — Z79899 Other long term (current) drug therapy: Secondary | ICD-10-CM | POA: Diagnosis not present

## 2020-02-28 DIAGNOSIS — Z5112 Encounter for antineoplastic immunotherapy: Secondary | ICD-10-CM | POA: Diagnosis not present

## 2020-02-28 DIAGNOSIS — J449 Chronic obstructive pulmonary disease, unspecified: Secondary | ICD-10-CM | POA: Diagnosis not present

## 2020-02-28 DIAGNOSIS — Z7901 Long term (current) use of anticoagulants: Secondary | ICD-10-CM | POA: Diagnosis not present

## 2020-02-28 DIAGNOSIS — C3411 Malignant neoplasm of upper lobe, right bronchus or lung: Secondary | ICD-10-CM

## 2020-02-28 DIAGNOSIS — I739 Peripheral vascular disease, unspecified: Secondary | ICD-10-CM | POA: Diagnosis not present

## 2020-02-28 DIAGNOSIS — I252 Old myocardial infarction: Secondary | ICD-10-CM | POA: Diagnosis not present

## 2020-02-28 DIAGNOSIS — E785 Hyperlipidemia, unspecified: Secondary | ICD-10-CM | POA: Diagnosis not present

## 2020-02-28 DIAGNOSIS — I2510 Atherosclerotic heart disease of native coronary artery without angina pectoris: Secondary | ICD-10-CM | POA: Diagnosis not present

## 2020-02-28 DIAGNOSIS — Z7689 Persons encountering health services in other specified circumstances: Secondary | ICD-10-CM | POA: Diagnosis not present

## 2020-02-28 DIAGNOSIS — Z5111 Encounter for antineoplastic chemotherapy: Secondary | ICD-10-CM

## 2020-02-28 DIAGNOSIS — I10 Essential (primary) hypertension: Secondary | ICD-10-CM | POA: Diagnosis not present

## 2020-02-28 DIAGNOSIS — C3491 Malignant neoplasm of unspecified part of right bronchus or lung: Secondary | ICD-10-CM

## 2020-02-28 DIAGNOSIS — Z87442 Personal history of urinary calculi: Secondary | ICD-10-CM | POA: Diagnosis not present

## 2020-02-28 LAB — CBC WITH DIFFERENTIAL (CANCER CENTER ONLY)
Abs Immature Granulocytes: 0.07 10*3/uL (ref 0.00–0.07)
Basophils Absolute: 0 10*3/uL (ref 0.0–0.1)
Basophils Relative: 1 %
Eosinophils Absolute: 0 10*3/uL (ref 0.0–0.5)
Eosinophils Relative: 0 %
HCT: 31.2 % — ABNORMAL LOW (ref 39.0–52.0)
Hemoglobin: 10.1 g/dL — ABNORMAL LOW (ref 13.0–17.0)
Immature Granulocytes: 1 %
Lymphocytes Relative: 18 %
Lymphs Abs: 1.5 10*3/uL (ref 0.7–4.0)
MCH: 33.4 pg (ref 26.0–34.0)
MCHC: 32.4 g/dL (ref 30.0–36.0)
MCV: 103.3 fL — ABNORMAL HIGH (ref 80.0–100.0)
Monocytes Absolute: 0.8 10*3/uL (ref 0.1–1.0)
Monocytes Relative: 10 %
Neutro Abs: 5.8 10*3/uL (ref 1.7–7.7)
Neutrophils Relative %: 70 %
Platelet Count: 261 10*3/uL (ref 150–400)
RBC: 3.02 MIL/uL — ABNORMAL LOW (ref 4.22–5.81)
RDW: 18.4 % — ABNORMAL HIGH (ref 11.5–15.5)
WBC Count: 8.2 10*3/uL (ref 4.0–10.5)
nRBC: 0 % (ref 0.0–0.2)

## 2020-02-28 LAB — CMP (CANCER CENTER ONLY)
ALT: 11 U/L (ref 0–44)
AST: 12 U/L — ABNORMAL LOW (ref 15–41)
Albumin: 3.5 g/dL (ref 3.5–5.0)
Alkaline Phosphatase: 100 U/L (ref 38–126)
Anion gap: 10 (ref 5–15)
BUN: 18 mg/dL (ref 8–23)
CO2: 28 mmol/L (ref 22–32)
Calcium: 9.4 mg/dL (ref 8.9–10.3)
Chloride: 101 mmol/L (ref 98–111)
Creatinine: 1.49 mg/dL — ABNORMAL HIGH (ref 0.61–1.24)
GFR, Est AFR Am: 54 mL/min — ABNORMAL LOW (ref >60)
GFR, Estimated: 47 mL/min — ABNORMAL LOW (ref >60)
Glucose, Bld: 190 mg/dL — ABNORMAL HIGH (ref 70–99)
Potassium: 4 mmol/L (ref 3.5–5.1)
Sodium: 139 mmol/L (ref 135–145)
Total Bilirubin: 0.3 mg/dL (ref 0.3–1.2)
Total Protein: 6.4 g/dL — ABNORMAL LOW (ref 6.5–8.1)

## 2020-02-28 MED ORDER — SODIUM CHLORIDE 0.9 % IV SOLN
Freq: Once | INTRAVENOUS | Status: AC
  Filled 2020-02-28: qty 250

## 2020-02-28 MED ORDER — PALONOSETRON HCL INJECTION 0.25 MG/5ML
INTRAVENOUS | Status: AC
  Filled 2020-02-28: qty 5

## 2020-02-28 MED ORDER — SODIUM CHLORIDE 0.9 % IV SOLN
430.0000 mg | Freq: Once | INTRAVENOUS | Status: AC
  Administered 2020-02-28: 430 mg via INTRAVENOUS
  Filled 2020-02-28: qty 43

## 2020-02-28 MED ORDER — SODIUM CHLORIDE 0.9 % IV SOLN
100.0000 mg/m2 | Freq: Once | INTRAVENOUS | Status: AC
  Administered 2020-02-28: 220 mg via INTRAVENOUS
  Filled 2020-02-28: qty 11

## 2020-02-28 MED ORDER — SODIUM CHLORIDE 0.9 % IV SOLN
150.0000 mg | Freq: Once | INTRAVENOUS | Status: AC
  Administered 2020-02-28: 150 mg via INTRAVENOUS
  Filled 2020-02-28: qty 150

## 2020-02-28 MED ORDER — SODIUM CHLORIDE 0.9 % IV SOLN
10.0000 mg | Freq: Once | INTRAVENOUS | Status: AC
  Administered 2020-02-28: 10 mg via INTRAVENOUS
  Filled 2020-02-28: qty 10

## 2020-02-28 MED ORDER — PALONOSETRON HCL INJECTION 0.25 MG/5ML
0.2500 mg | Freq: Once | INTRAVENOUS | Status: AC
  Administered 2020-02-28: 0.25 mg via INTRAVENOUS

## 2020-02-28 NOTE — Patient Instructions (Signed)
[place] Discharge Instructions for Patients Receiving Chemotherapy  Today you received the following chemotherapy agents: carboplatin and etoposide.   To help prevent nausea and vomiting after your treatment, we encourage you to take your nausea medication as directed.   If you develop nausea and vomiting that is not controlled by your nausea medication, call the clinic.   BELOW ARE SYMPTOMS THAT SHOULD BE REPORTED IMMEDIATELY:  *FEVER GREATER THAN 100.5 F  *CHILLS WITH OR WITHOUT FEVER  NAUSEA AND VOMITING THAT IS NOT CONTROLLED WITH YOUR NAUSEA MEDICATION  *UNUSUAL SHORTNESS OF BREATH  *UNUSUAL BRUISING OR BLEEDING  TENDERNESS IN MOUTH AND THROAT WITH OR WITHOUT PRESENCE OF ULCERS  *URINARY PROBLEMS  *BOWEL PROBLEMS  UNUSUAL RASH Items with * indicate a potential emergency and should be followed up as soon as possible.  Feel free to call the clinic should you have any questions or concerns. The clinic phone number is [phone].  Please show the [place] at check-in to the Emergency Department and triage nurse.

## 2020-02-28 NOTE — Progress Notes (Signed)
[place] [phone]   [phone]  OFFICE PROGRESS NOTE  Luetta Nutting, DO [place] [address]  DIAGNOSIS: limited stage, stage Ia (T1b, N0, M0) small cell lung cancer presented with right upper lobe pulmonary nodule that is hypermetabolic and biopsy proven to be consistent with small cell carcinoma diagnosed in April 2021.  PRIOR THERAPY: None  CURRENT THERAPY: Concurrent chemoradiation with carboplatin for an AUC of 5 on days 1 and etoposide 100 mg/m2 on days 1, 2, and 3 IV every 3 weeks.  First dose received on 01/10/2020.   Status post 2 cycles of treatment.  This was concurrent with radiotherapy to the right upper lobe pulmonary nodule with SBRT.  INTERVAL HISTORY: [name] 74 y.o. male returns to the clinic today for follow-up visit.  The patient is feeling fine today with no concerning complaints except for fatigue and aching pain after the Neulasta injection.  He denied having any current chest pain, shortness of breath, cough or hemoptysis.  He denied having any fever or chills.  He has no nausea, vomiting, diarrhea or constipation.  He has no headache or visual changes.  He tolerated the last cycle of his treatment fairly well.  The patient is here today for evaluation before starting cycle #3 of his treatment.  MEDICAL HISTORY: Past Medical History:  Diagnosis Date  . Atypical atrial flutter ([place])   . Cancer [facility])    Testicular- surgery   . COPD (chronic obstructive pulmonary disease) ([place])   . Coronary artery disease   . Diastolic dysfunction, left ventricle 05/2018   Dr. Rayann Heman- Cardiologist  . Dysrhythmia    chronic AFib  . History of kidney stones    passed  . HLD (hyperlipidemia)   . HTN (hypertension)   . Myocardial infarction ([place])    x 2 maybe 1 more  . OSA (obstructive sleep apnea)    uses CPAP  . Peripheral artery disease ([place])    pseudoaneurysm post afib ablation at  [facility] 2011, s/p bilateral iliac stents  . Persistent atrial fibrillation ([place])   . Pre-diabetes   . Renal artery stenosis ([facility])    right renal artery PTA and stenting  . S/P coronary artery bypass graft x 7 11/16/96    ALLERGIES:  has No Known Allergies.  MEDICATIONS:  Current Outpatient Medications  Medication Sig Dispense Refill  . acetaminophen (TYLENOL) 500 MG tablet Take 1,000 mg every 6 (six) hours as needed by mouth (for pain.).    [name] atorvastatin (LIPITOR) 10 MG tablet TAKE 1 TABLET(10 MG) BY MOUTH DAILY (Patient taking differently: Take 10 mg by mouth every evening. TAKE 1 TABLET(10 MG) BY MOUTH DAILY) 90 tablet 2  . budesonide-formoterol (SYMBICORT) 160-4.5 MCG/ACT inhaler Inhale 2 puffs into the lungs 2 (two) times daily. 10.2 g 3  . cetirizine (ZYRTEC) 10 MG tablet Take 10 mg by mouth daily.    [name] dronedarone (MULTAQ) 400 MG tablet TAKE 1 TABLET BY MOUTH TWICE DAILY WITH MEAL 180 tablet 1  . empagliflozin (JARDIANCE) 10 MG TABS tablet Take 10 mg by mouth daily. Schedule appt with PCP 90 tablet 2  . furosemide (LASIX) 40 MG tablet TAKE 1 TABLET BY MOUTH AS NEEDED (Patient taking differently: Take 40 mg by mouth daily as needed for fluid. ) 30 tablet 3  . gabapentin (NEURONTIN) 300 MG capsule Take 2 capsules (600 mg total) by mouth at bedtime. 180 capsule 2  .  ketotifen (ZADITOR) 0.025 % ophthalmic solution Place 1 drop into both eyes 2 (two) times daily as needed (allergies).    [name] lisinopril-hydrochlorothiazide (ZESTORETIC) 10-12.5 MG tablet TAKE 1 TABLET BY MOUTH DAILY (Patient taking differently: Take 1 tablet by mouth every evening. ) 90 tablet 1  . metoprolol succinate (TOPROL-XL) 100 MG 24 hr tablet TAKE 1 TABLET(100 MG) BY MOUTH DAILY (Patient taking differently: Take 100 mg by mouth every evening. TAKE 1 TABLET(100 MG) BY MOUTH DAILY) 90 tablet 3  . Multiple Vitamin (MULTIVITAMIN WITH MINERALS) TABS tablet Take 1 tablet by mouth daily. In the morning.    . niacin (NIASPAN) 1000  MG CR tablet TAKE 1 TABLET(1000 MG) BY MOUTH AT BEDTIME (Patient taking differently: Take 1,000 mg by mouth at bedtime. ) 90 tablet 3  . nicotine polacrilex (COMMIT) 2 MG lozenge Take 2 mg by mouth as needed for smoking cessation.    . NON FORMULARY CPAP at night    . prochlorperazine (COMPAZINE) 10 MG tablet Take 1 tablet (10 mg total) by mouth every 6 (six) hours as needed for nausea or vomiting. 30 tablet 0  . traMADol (ULTRAM) 50 MG tablet Take 1 tablet (50 mg total) by mouth every 12 (twelve) hours as needed. 30 tablet 0  . traZODone (DESYREL) 50 MG tablet TAKE 1/2 TO 1 TABLET BY MOUTH AT BEDTIME AS NEEDED FOR SLEEP (Patient taking differently: Take 50 mg by mouth at bedtime. ) 90 tablet 3  . VENTOLIN HFA 108 (90 Base) MCG/ACT inhaler INHALE 2 PUFFS INTO THE LUNGS EVERY 6 HOURS AS NEEDED FOR WHEEZING OR SHORTNESS OF BREATH (Patient taking differently: Inhale 2 puffs into the lungs every 6 (six) hours as needed (wheezing/shortness of breath.). ) 18 g 1  . XARELTO 20 MG TABS tablet TAKE 1 TABLET BY MOUTH EVERY DAY WITH SUPPER (Patient taking differently: Take 20 mg by mouth every evening. ) 90 tablet 1   No current facility-administered medications for this visit.    SURGICAL HISTORY:  Past Surgical History:  Procedure Laterality Date  . 2-D echocardiogram  03/25/2010   Normal left ventricular function. Mild MR, TR, trivial AR  . ATRIAL FIBRILLATION ABLATION  03/27/2010, 12/31/2010   [facility], Dr. Nadeen Landau  . BACK SURGERY  2004, 2007   Laminectomy, Discedectomy x 2  . BRONCHIAL BIOPSY  12/27/2019   Procedure: BRONCHIAL BIOPSIES;  Surgeon: Garner Nash, DO;  Location: [facility];  Service: Pulmonary;;  . BRONCHIAL BRUSHINGS  12/27/2019   Procedure: BRONCHIAL BRUSHINGS;  Surgeon: Garner Nash, DO;  Location: [facility];  Service: Pulmonary;;  . BRONCHIAL NEEDLE ASPIRATION BIOPSY  12/27/2019   Procedure: BRONCHIAL NEEDLE ASPIRATION BIOPSIES;  Surgeon: Garner Nash, DO;  Location: [facility]  ENDOSCOPY;  Service: Pulmonary;;  . cardiac stress test  11/21/2009   Exercise capacity 5 METS. No significant ischemia demonstrated  . CARDIOVERSION N/A 03/15/2015   Procedure: CARDIOVERSION;  Surgeon: Lorretta Harp, MD;  Location: [facility] ENDOSCOPY;  Service: Cardiovascular;  Laterality: N/A;  . CARDIOVERSION N/A 07/10/2017   Procedure: CARDIOVERSION;  Surgeon: Pixie Casino, MD;  Location: [facility] ENDOSCOPY;  Service: Cardiovascular;  Laterality: N/A;  . CARDIOVERSION N/A 03/23/2018   Procedure: CARDIOVERSION;  Surgeon: Sanda Klein, MD;  Location: [facility] ENDOSCOPY;  Service: Cardiovascular;  Laterality: N/A;  . CORONARY ARTERY BYPASS GRAFT  1998  . ELECTROMAGNETIC NAVIGATION BROCHOSCOPY  12/27/2019   Procedure: NAVIGATION BRONCHOSCOPY;  Surgeon: Garner Nash, DO;  Location: [facility] ENDOSCOPY;  Service: Pulmonary;;  . FIDUCIAL MARKER PLACEMENT  12/27/2019   Procedure: FIDUCIAL MARKER PLACEMENT;  Surgeon: Garner Nash, DO;  Location: [facility]OSCOPY;  Service: Pulmonary;;  . ORCHIECTOMY  1981   for cancer  . TOOTH EXTRACTION  05/27/2013   tooth extraction with bone graft- several -for Implants  . VIDEO BRONCHOSCOPY WITH ENDOBRONCHIAL NAVIGATION N/A 12/27/2019   Procedure: VIDEO BRONCHOSCOPY;  Surgeon: Garner Nash, DO;  Location: [facility];  Service: Pulmonary;  Laterality: N/A;    REVIEW OF SYSTEMS:  A comprehensive review of systems was negative except for: Constitutional: positive for fatigue   PHYSICAL EXAMINATION: General appearance: alert, cooperative, fatigued and no distress Head: Normocephalic, without obvious abnormality, atraumatic Neck: no adenopathy, no JVD, supple, symmetrical, trachea midline and thyroid not enlarged, symmetric, no tenderness/mass/nodules Lymph nodes: Cervical, supraclavicular, and axillary nodes normal. Resp: clear to auscultation bilaterally Back: symmetric, no curvature. ROM normal. No CVA tenderness. Cardio: regular rate and rhythm, S1, S2 normal, no  murmur, click, rub or gallop GI: soft, non-tender; bowel sounds normal; no masses,  no organomegaly Extremities: extremities normal, atraumatic, no cyanosis or edema  ECOG PERFORMANCE STATUS: 1 - Symptomatic but completely ambulatory  Blood pressure (!) 103/50, pulse 85, temperature 98.1 F (36.7 C), temperature source Temporal, resp. rate 20, height 6' (1.829 m), weight 215 lb 11.2 oz (97.8 kg), SpO2 97 %.  LABORATORY DATA: Lab Results  Component Value Date   WBC 8.2 02/28/2020   HGB 10.1 (L) 02/28/2020   HCT 31.2 (L) 02/28/2020   MCV 103.3 (H) 02/28/2020   PLT 261 02/28/2020      Chemistry      Component Value Date/Time   NA 137 02/21/2020 1211   NA 142 04/18/2010 0000   K 4.1 02/21/2020 1211   CL 100 02/21/2020 1211   CO2 28 02/21/2020 1211   BUN 17 02/21/2020 1211   CREATININE 1.41 (H) 02/21/2020 1211   CREATININE 1.41 (H) 09/14/2019 0943      Component Value Date/Time   CALCIUM 9.6 02/21/2020 1211   ALKPHOS 133 (H) 02/21/2020 1211   AST 15 02/21/2020 1211   ALT 26 02/21/2020 1211   BILITOT 0.4 02/21/2020 1211       RADIOGRAPHIC STUDIES: No results found.  ASSESSMENT AND PLAN: This is a very pleasant 74 years old white male with limited stage, stage Ia small cell lung cancer presented with right upper lobe pulmonary nodule diagnosed in April 2021. The patient is undergoing a course of systemic chemotherapy with carboplatin and etoposide in addition to SBRT to the right upper lobe lung nodule.  Status post 2 cycles. The patient has been tolerating his systemic chemotherapy fairly well except for fatigue and some aching pain after the Neulasta injection. I recommended for him to proceed with cycle #3 today as planned. The patient will come back for follow-up visit in 3 weeks for evaluation before starting the last cycle of his treatment. He was advised to call immediately if he has any concerning symptoms in the interval. The patient voices understanding of current  disease status and treatment options and is in agreement with the current care plan.  All questions were answered. The patient knows to call the clinic with any problems, questions or concerns. We can certainly see the patient much sooner if necessary.  Disclaimer: This note was dictated with voice recognition software. Similar sounding words can inadvertently be transcribed and may not be corrected upon review.

## 2020-02-29 ENCOUNTER — Inpatient Hospital Stay: Payer: Medicare Other

## 2020-02-29 ENCOUNTER — Ambulatory Visit: Payer: Medicare Other | Admitting: Family Medicine

## 2020-02-29 ENCOUNTER — Other Ambulatory Visit: Payer: Self-pay

## 2020-02-29 VITALS — BP 107/41 | HR 67 | Temp 98.7°F | Resp 17

## 2020-02-29 DIAGNOSIS — Z7901 Long term (current) use of anticoagulants: Secondary | ICD-10-CM | POA: Diagnosis not present

## 2020-02-29 DIAGNOSIS — Z5112 Encounter for antineoplastic immunotherapy: Secondary | ICD-10-CM | POA: Diagnosis not present

## 2020-02-29 DIAGNOSIS — J449 Chronic obstructive pulmonary disease, unspecified: Secondary | ICD-10-CM | POA: Diagnosis not present

## 2020-02-29 DIAGNOSIS — E785 Hyperlipidemia, unspecified: Secondary | ICD-10-CM | POA: Diagnosis not present

## 2020-02-29 DIAGNOSIS — C3411 Malignant neoplasm of upper lobe, right bronchus or lung: Secondary | ICD-10-CM | POA: Diagnosis not present

## 2020-02-29 DIAGNOSIS — Z87442 Personal history of urinary calculi: Secondary | ICD-10-CM | POA: Diagnosis not present

## 2020-02-29 DIAGNOSIS — I10 Essential (primary) hypertension: Secondary | ICD-10-CM | POA: Diagnosis not present

## 2020-02-29 DIAGNOSIS — I2510 Atherosclerotic heart disease of native coronary artery without angina pectoris: Secondary | ICD-10-CM | POA: Diagnosis not present

## 2020-02-29 DIAGNOSIS — Z79899 Other long term (current) drug therapy: Secondary | ICD-10-CM | POA: Diagnosis not present

## 2020-02-29 DIAGNOSIS — I739 Peripheral vascular disease, unspecified: Secondary | ICD-10-CM | POA: Diagnosis not present

## 2020-02-29 DIAGNOSIS — Z7689 Persons encountering health services in other specified circumstances: Secondary | ICD-10-CM | POA: Diagnosis not present

## 2020-02-29 DIAGNOSIS — I252 Old myocardial infarction: Secondary | ICD-10-CM | POA: Diagnosis not present

## 2020-02-29 MED ORDER — SODIUM CHLORIDE 0.9 % IV SOLN
100.0000 mg/m2 | Freq: Once | INTRAVENOUS | Status: AC
  Administered 2020-02-29: 220 mg via INTRAVENOUS
  Filled 2020-02-29: qty 11

## 2020-02-29 MED ORDER — SODIUM CHLORIDE 0.9 % IV SOLN
Freq: Once | INTRAVENOUS | Status: AC
  Filled 2020-02-29: qty 250

## 2020-02-29 MED ORDER — SODIUM CHLORIDE 0.9 % IV SOLN
10.0000 mg | Freq: Once | INTRAVENOUS | Status: AC
  Administered 2020-02-29: 10 mg via INTRAVENOUS
  Filled 2020-02-29: qty 10

## 2020-02-29 NOTE — Patient Instructions (Signed)
[facility] Discharge Instructions for Patients Receiving Chemotherapy  Today you received the following chemotherapy agents: etoposide  To help prevent nausea and vomiting after your treatment, we encourage you to take your nausea medication as directed.   If you develop nausea and vomiting that is not controlled by your nausea medication, call the clinic.   BELOW ARE SYMPTOMS THAT SHOULD BE REPORTED IMMEDIATELY:  *FEVER GREATER THAN 100.5 F  *CHILLS WITH OR WITHOUT FEVER  NAUSEA AND VOMITING THAT IS NOT CONTROLLED WITH YOUR NAUSEA MEDICATION  *UNUSUAL SHORTNESS OF BREATH  *UNUSUAL BRUISING OR BLEEDING  TENDERNESS IN MOUTH AND THROAT WITH OR WITHOUT PRESENCE OF ULCERS  *URINARY PROBLEMS  *BOWEL PROBLEMS  UNUSUAL RASH Items with * indicate a potential emergency and should be followed up as soon as possible.  Feel free to call the clinic should you have any questions or concerns. The clinic phone number is [phone].  Please show the CHEMO ALERT CARD at check-in to the Emergency Department and triage nurse.   

## 2020-03-01 ENCOUNTER — Inpatient Hospital Stay: Payer: Medicare Other | Attending: Internal Medicine

## 2020-03-01 ENCOUNTER — Other Ambulatory Visit: Payer: Self-pay | Admitting: Internal Medicine

## 2020-03-01 ENCOUNTER — Other Ambulatory Visit: Payer: Self-pay

## 2020-03-01 VITALS — BP 111/51 | HR 73 | Temp 98.0°F | Resp 18

## 2020-03-01 DIAGNOSIS — I252 Old myocardial infarction: Secondary | ICD-10-CM | POA: Insufficient documentation

## 2020-03-01 DIAGNOSIS — I482 Chronic atrial fibrillation, unspecified: Secondary | ICD-10-CM | POA: Diagnosis not present

## 2020-03-01 DIAGNOSIS — G4730 Sleep apnea, unspecified: Secondary | ICD-10-CM | POA: Insufficient documentation

## 2020-03-01 DIAGNOSIS — I2510 Atherosclerotic heart disease of native coronary artery without angina pectoris: Secondary | ICD-10-CM | POA: Diagnosis not present

## 2020-03-01 DIAGNOSIS — D6481 Anemia due to antineoplastic chemotherapy: Secondary | ICD-10-CM | POA: Diagnosis not present

## 2020-03-01 DIAGNOSIS — Z79899 Other long term (current) drug therapy: Secondary | ICD-10-CM | POA: Diagnosis not present

## 2020-03-01 DIAGNOSIS — R110 Nausea: Secondary | ICD-10-CM | POA: Insufficient documentation

## 2020-03-01 DIAGNOSIS — Z5111 Encounter for antineoplastic chemotherapy: Secondary | ICD-10-CM | POA: Insufficient documentation

## 2020-03-01 DIAGNOSIS — I10 Essential (primary) hypertension: Secondary | ICD-10-CM | POA: Insufficient documentation

## 2020-03-01 DIAGNOSIS — I701 Atherosclerosis of renal artery: Secondary | ICD-10-CM | POA: Insufficient documentation

## 2020-03-01 DIAGNOSIS — I739 Peripheral vascular disease, unspecified: Secondary | ICD-10-CM | POA: Diagnosis not present

## 2020-03-01 DIAGNOSIS — Z5189 Encounter for other specified aftercare: Secondary | ICD-10-CM | POA: Insufficient documentation

## 2020-03-01 DIAGNOSIS — E785 Hyperlipidemia, unspecified: Secondary | ICD-10-CM | POA: Insufficient documentation

## 2020-03-01 DIAGNOSIS — T451X5A Adverse effect of antineoplastic and immunosuppressive drugs, initial encounter: Secondary | ICD-10-CM | POA: Insufficient documentation

## 2020-03-01 DIAGNOSIS — C3411 Malignant neoplasm of upper lobe, right bronchus or lung: Secondary | ICD-10-CM | POA: Diagnosis not present

## 2020-03-01 DIAGNOSIS — I959 Hypotension, unspecified: Secondary | ICD-10-CM | POA: Diagnosis not present

## 2020-03-01 DIAGNOSIS — Z7901 Long term (current) use of anticoagulants: Secondary | ICD-10-CM | POA: Insufficient documentation

## 2020-03-01 DIAGNOSIS — J449 Chronic obstructive pulmonary disease, unspecified: Secondary | ICD-10-CM | POA: Diagnosis not present

## 2020-03-01 DIAGNOSIS — Z923 Personal history of irradiation: Secondary | ICD-10-CM | POA: Insufficient documentation

## 2020-03-01 DIAGNOSIS — M2550 Pain in unspecified joint: Secondary | ICD-10-CM | POA: Diagnosis not present

## 2020-03-01 MED ORDER — SODIUM CHLORIDE 0.9 % IV SOLN
Freq: Once | INTRAVENOUS | Status: AC
  Filled 2020-03-01: qty 250

## 2020-03-01 MED ORDER — SODIUM CHLORIDE 0.9 % IV SOLN
100.0000 mg/m2 | Freq: Once | INTRAVENOUS | Status: AC
  Administered 2020-03-01: 220 mg via INTRAVENOUS
  Filled 2020-03-01: qty 11

## 2020-03-01 MED ORDER — SODIUM CHLORIDE 0.9 % IV SOLN
10.0000 mg | Freq: Once | INTRAVENOUS | Status: AC
  Administered 2020-03-01: 10 mg via INTRAVENOUS
  Filled 2020-03-01: qty 10

## 2020-03-01 NOTE — Patient Instructions (Signed)
[facility] Discharge Instructions for Patients Receiving Chemotherapy  Today you received the following chemotherapy agents Etoposide  To help prevent nausea and vomiting after your treatment, we encourage you to take your nausea medication as directed.   If you develop nausea and vomiting that is not controlled by your nausea medication, call the clinic.   BELOW ARE SYMPTOMS THAT SHOULD BE REPORTED IMMEDIATELY:  *FEVER GREATER THAN 100.5 F  *CHILLS WITH OR WITHOUT FEVER  NAUSEA AND VOMITING THAT IS NOT CONTROLLED WITH YOUR NAUSEA MEDICATION  *UNUSUAL SHORTNESS OF BREATH  *UNUSUAL BRUISING OR BLEEDING  TENDERNESS IN MOUTH AND THROAT WITH OR WITHOUT PRESENCE OF ULCERS  *URINARY PROBLEMS  *BOWEL PROBLEMS  UNUSUAL RASH Items with * indicate a potential emergency and should be followed up as soon as possible.  Feel free to call the clinic should you have any questions or concerns. The clinic phone number is [phone].  Please show the [place] at check-in to the Emergency Department and triage nurse.

## 2020-03-03 ENCOUNTER — Other Ambulatory Visit: Payer: Self-pay

## 2020-03-03 ENCOUNTER — Inpatient Hospital Stay: Payer: Medicare Other

## 2020-03-03 ENCOUNTER — Telehealth: Payer: Self-pay | Admitting: Medical Oncology

## 2020-03-03 VITALS — BP 125/52 | HR 78 | Temp 98.0°F | Resp 18

## 2020-03-03 DIAGNOSIS — I482 Chronic atrial fibrillation, unspecified: Secondary | ICD-10-CM | POA: Diagnosis not present

## 2020-03-03 DIAGNOSIS — Z5189 Encounter for other specified aftercare: Secondary | ICD-10-CM | POA: Diagnosis not present

## 2020-03-03 DIAGNOSIS — I2510 Atherosclerotic heart disease of native coronary artery without angina pectoris: Secondary | ICD-10-CM | POA: Diagnosis not present

## 2020-03-03 DIAGNOSIS — M2550 Pain in unspecified joint: Secondary | ICD-10-CM | POA: Diagnosis not present

## 2020-03-03 DIAGNOSIS — J449 Chronic obstructive pulmonary disease, unspecified: Secondary | ICD-10-CM | POA: Diagnosis not present

## 2020-03-03 DIAGNOSIS — Z5111 Encounter for antineoplastic chemotherapy: Secondary | ICD-10-CM | POA: Diagnosis not present

## 2020-03-03 DIAGNOSIS — I739 Peripheral vascular disease, unspecified: Secondary | ICD-10-CM | POA: Diagnosis not present

## 2020-03-03 DIAGNOSIS — C3411 Malignant neoplasm of upper lobe, right bronchus or lung: Secondary | ICD-10-CM | POA: Diagnosis not present

## 2020-03-03 DIAGNOSIS — R110 Nausea: Secondary | ICD-10-CM | POA: Diagnosis not present

## 2020-03-03 DIAGNOSIS — I959 Hypotension, unspecified: Secondary | ICD-10-CM | POA: Diagnosis not present

## 2020-03-03 DIAGNOSIS — I10 Essential (primary) hypertension: Secondary | ICD-10-CM | POA: Diagnosis not present

## 2020-03-03 DIAGNOSIS — I252 Old myocardial infarction: Secondary | ICD-10-CM | POA: Diagnosis not present

## 2020-03-03 DIAGNOSIS — E785 Hyperlipidemia, unspecified: Secondary | ICD-10-CM | POA: Diagnosis not present

## 2020-03-03 DIAGNOSIS — T451X5A Adverse effect of antineoplastic and immunosuppressive drugs, initial encounter: Secondary | ICD-10-CM | POA: Diagnosis not present

## 2020-03-03 DIAGNOSIS — G4730 Sleep apnea, unspecified: Secondary | ICD-10-CM | POA: Diagnosis not present

## 2020-03-03 DIAGNOSIS — D6481 Anemia due to antineoplastic chemotherapy: Secondary | ICD-10-CM | POA: Diagnosis not present

## 2020-03-03 MED ORDER — PEGFILGRASTIM-JMDB 6 MG/0.6ML ~~LOC~~ SOSY
6.0000 mg | PREFILLED_SYRINGE | Freq: Once | SUBCUTANEOUS | Status: AC
  Administered 2020-03-03: 6 mg via SUBCUTANEOUS

## 2020-03-03 NOTE — Telephone Encounter (Signed)
Pt confirmed appt today.

## 2020-03-03 NOTE — Patient Instructions (Signed)

## 2020-03-06 ENCOUNTER — Encounter: Payer: Self-pay | Admitting: Internal Medicine

## 2020-03-06 ENCOUNTER — Inpatient Hospital Stay: Payer: Medicare Other

## 2020-03-06 ENCOUNTER — Other Ambulatory Visit: Payer: Self-pay

## 2020-03-06 ENCOUNTER — Telehealth: Payer: Self-pay | Admitting: Physician Assistant

## 2020-03-06 DIAGNOSIS — C3491 Malignant neoplasm of unspecified part of right bronchus or lung: Secondary | ICD-10-CM

## 2020-03-06 DIAGNOSIS — C3411 Malignant neoplasm of upper lobe, right bronchus or lung: Secondary | ICD-10-CM | POA: Diagnosis not present

## 2020-03-06 LAB — CMP (CANCER CENTER ONLY)
ALT: 18 U/L (ref 0–44)
AST: 15 U/L (ref 15–41)
Albumin: 3.5 g/dL (ref 3.5–5.0)
Alkaline Phosphatase: 109 U/L (ref 38–126)
Anion gap: 9 (ref 5–15)
BUN: 30 mg/dL — ABNORMAL HIGH (ref 8–23)
CO2: 26 mmol/L (ref 22–32)
Calcium: 9.4 mg/dL (ref 8.9–10.3)
Chloride: 102 mmol/L (ref 98–111)
Creatinine: 1.04 mg/dL (ref 0.61–1.24)
GFR, Est AFR Am: >60 mL/min (ref >60)
GFR, Estimated: >60 mL/min (ref >60)
Glucose, Bld: 151 mg/dL — ABNORMAL HIGH (ref 70–99)
Potassium: 4.1 mmol/L (ref 3.5–5.1)
Sodium: 137 mmol/L (ref 135–145)
Total Bilirubin: 1.1 mg/dL (ref 0.3–1.2)
Total Protein: 6.2 g/dL — ABNORMAL LOW (ref 6.5–8.1)

## 2020-03-06 LAB — CBC WITH DIFFERENTIAL (CANCER CENTER ONLY)
Abs Immature Granulocytes: 0 10*3/uL (ref 0.00–0.07)
Basophils Absolute: 0 10*3/uL (ref 0.0–0.1)
Basophils Relative: 0 %
Eosinophils Absolute: 0 10*3/uL (ref 0.0–0.5)
Eosinophils Relative: 0 %
HCT: 25.9 % — ABNORMAL LOW (ref 39.0–52.0)
Hemoglobin: 8.7 g/dL — ABNORMAL LOW (ref 13.0–17.0)
Lymphocytes Relative: 10 %
Lymphs Abs: 1.3 10*3/uL (ref 0.7–4.0)
MCH: 34.7 pg — ABNORMAL HIGH (ref 26.0–34.0)
MCHC: 33.6 g/dL (ref 30.0–36.0)
MCV: 103.2 fL — ABNORMAL HIGH (ref 80.0–100.0)
Monocytes Absolute: 0.1 10*3/uL (ref 0.1–1.0)
Monocytes Relative: 1 %
Neutro Abs: 11.8 10*3/uL — ABNORMAL HIGH (ref 1.7–7.7)
Neutrophils Relative %: 89 %
Platelet Count: 112 10*3/uL — ABNORMAL LOW (ref 150–400)
RBC: 2.51 MIL/uL — ABNORMAL LOW (ref 4.22–5.81)
RDW: 17.6 % — ABNORMAL HIGH (ref 11.5–15.5)
WBC Count: 13.3 10*3/uL — ABNORMAL HIGH (ref 4.0–10.5)
nRBC: 0 % (ref 0.0–0.2)

## 2020-03-06 NOTE — Telephone Encounter (Signed)
Rescheduled 07/13 lab appointment to 07/14 per patient's request.

## 2020-03-07 ENCOUNTER — Telehealth: Payer: Self-pay

## 2020-03-07 ENCOUNTER — Encounter: Payer: Self-pay | Admitting: Medical Oncology

## 2020-03-07 NOTE — Telephone Encounter (Signed)
Patient sent as Deloris Ping message: Toshiro, Hanken "Robinette Haines, MD 7 minutes ago (9:23 AM)   Could I get a new prescript. for nitromist sent to my [employer] (intersection of [address] and [address])?  Thanks [name]    I did not see on med list currently-  Okay to refill? What dose?  Thanks!

## 2020-03-08 ENCOUNTER — Other Ambulatory Visit: Payer: Self-pay | Admitting: Medical Oncology

## 2020-03-08 ENCOUNTER — Telehealth: Payer: Self-pay | Admitting: Internal Medicine

## 2020-03-08 DIAGNOSIS — D649 Anemia, unspecified: Secondary | ICD-10-CM

## 2020-03-08 NOTE — Telephone Encounter (Signed)
Added appt per 7/7 sch msg - unable to reach pt - left message for patient with appt date and time

## 2020-03-08 NOTE — Telephone Encounter (Signed)
Effects the nitro spray sublingual I think is 0.4mg Dorita Fray

## 2020-03-09 ENCOUNTER — Other Ambulatory Visit: Payer: Self-pay | Admitting: Medical Oncology

## 2020-03-09 ENCOUNTER — Inpatient Hospital Stay: Payer: Medicare Other

## 2020-03-09 ENCOUNTER — Other Ambulatory Visit: Payer: Self-pay

## 2020-03-09 DIAGNOSIS — D649 Anemia, unspecified: Secondary | ICD-10-CM

## 2020-03-09 DIAGNOSIS — M2550 Pain in unspecified joint: Secondary | ICD-10-CM | POA: Diagnosis not present

## 2020-03-09 DIAGNOSIS — I252 Old myocardial infarction: Secondary | ICD-10-CM | POA: Diagnosis not present

## 2020-03-09 DIAGNOSIS — D6481 Anemia due to antineoplastic chemotherapy: Secondary | ICD-10-CM | POA: Diagnosis not present

## 2020-03-09 DIAGNOSIS — I959 Hypotension, unspecified: Secondary | ICD-10-CM | POA: Diagnosis not present

## 2020-03-09 DIAGNOSIS — G4730 Sleep apnea, unspecified: Secondary | ICD-10-CM | POA: Diagnosis not present

## 2020-03-09 DIAGNOSIS — R110 Nausea: Secondary | ICD-10-CM | POA: Diagnosis not present

## 2020-03-09 DIAGNOSIS — T451X5A Adverse effect of antineoplastic and immunosuppressive drugs, initial encounter: Secondary | ICD-10-CM | POA: Diagnosis not present

## 2020-03-09 DIAGNOSIS — Z5189 Encounter for other specified aftercare: Secondary | ICD-10-CM | POA: Diagnosis not present

## 2020-03-09 DIAGNOSIS — Z5111 Encounter for antineoplastic chemotherapy: Secondary | ICD-10-CM | POA: Diagnosis not present

## 2020-03-09 DIAGNOSIS — E785 Hyperlipidemia, unspecified: Secondary | ICD-10-CM | POA: Diagnosis not present

## 2020-03-09 DIAGNOSIS — I10 Essential (primary) hypertension: Secondary | ICD-10-CM | POA: Diagnosis not present

## 2020-03-09 DIAGNOSIS — C3411 Malignant neoplasm of upper lobe, right bronchus or lung: Secondary | ICD-10-CM | POA: Diagnosis not present

## 2020-03-09 DIAGNOSIS — I2510 Atherosclerotic heart disease of native coronary artery without angina pectoris: Secondary | ICD-10-CM | POA: Diagnosis not present

## 2020-03-09 DIAGNOSIS — J449 Chronic obstructive pulmonary disease, unspecified: Secondary | ICD-10-CM | POA: Diagnosis not present

## 2020-03-09 DIAGNOSIS — I739 Peripheral vascular disease, unspecified: Secondary | ICD-10-CM | POA: Diagnosis not present

## 2020-03-09 DIAGNOSIS — I482 Chronic atrial fibrillation, unspecified: Secondary | ICD-10-CM | POA: Diagnosis not present

## 2020-03-09 LAB — PREPARE RBC (CROSSMATCH)

## 2020-03-09 MED ORDER — DIPHENHYDRAMINE HCL 25 MG PO CAPS
ORAL_CAPSULE | ORAL | Status: AC
  Filled 2020-03-09: qty 1

## 2020-03-09 MED ORDER — ACETAMINOPHEN 325 MG PO TABS
ORAL_TABLET | ORAL | Status: AC
  Filled 2020-03-09: qty 2

## 2020-03-09 MED ORDER — ACETAMINOPHEN 325 MG PO TABS
650.0000 mg | ORAL_TABLET | Freq: Once | ORAL | Status: AC
  Administered 2020-03-09: 650 mg via ORAL

## 2020-03-09 MED ORDER — SODIUM CHLORIDE 0.9% IV SOLUTION
250.0000 mL | Freq: Once | INTRAVENOUS | Status: AC
  Administered 2020-03-09: 250 mL via INTRAVENOUS
  Filled 2020-03-09: qty 250

## 2020-03-09 MED ORDER — NITROGLYCERIN 0.4 MG/SPRAY TL SOLN
1.0000 | 2 refills | Status: DC | PRN
Start: 2020-03-09 — End: 2020-03-21

## 2020-03-09 MED ORDER — DIPHENHYDRAMINE HCL 25 MG PO CAPS
25.0000 mg | ORAL_CAPSULE | Freq: Once | ORAL | Status: AC
  Administered 2020-03-09: 25 mg via ORAL

## 2020-03-09 NOTE — Telephone Encounter (Signed)
RX called into pharmacy will mychart patient.

## 2020-03-09 NOTE — Patient Instructions (Signed)

## 2020-03-10 LAB — BPAM RBC
Blood Product Expiration Date: 202107292359
ISSUE DATE / TIME: 202107091518
Unit Type and Rh: 6200

## 2020-03-10 LAB — TYPE AND SCREEN
ABO/RH(D): AB POS
Antibody Screen: NEGATIVE
Unit division: 0

## 2020-03-10 LAB — ABO/RH: ABO/RH(D): AB POS

## 2020-03-12 ENCOUNTER — Other Ambulatory Visit: Payer: Self-pay

## 2020-03-13 ENCOUNTER — Other Ambulatory Visit: Payer: Medicare Other

## 2020-03-14 ENCOUNTER — Inpatient Hospital Stay: Payer: Medicare Other

## 2020-03-14 ENCOUNTER — Other Ambulatory Visit: Payer: Self-pay | Admitting: Medical Oncology

## 2020-03-14 ENCOUNTER — Telehealth: Payer: Self-pay | Admitting: Medical Oncology

## 2020-03-14 ENCOUNTER — Other Ambulatory Visit: Payer: Self-pay

## 2020-03-14 ENCOUNTER — Ambulatory Visit: Payer: Medicare Other

## 2020-03-14 DIAGNOSIS — I252 Old myocardial infarction: Secondary | ICD-10-CM | POA: Diagnosis not present

## 2020-03-14 DIAGNOSIS — D649 Anemia, unspecified: Secondary | ICD-10-CM

## 2020-03-14 DIAGNOSIS — I739 Peripheral vascular disease, unspecified: Secondary | ICD-10-CM | POA: Diagnosis not present

## 2020-03-14 DIAGNOSIS — C3411 Malignant neoplasm of upper lobe, right bronchus or lung: Secondary | ICD-10-CM | POA: Diagnosis not present

## 2020-03-14 DIAGNOSIS — I2510 Atherosclerotic heart disease of native coronary artery without angina pectoris: Secondary | ICD-10-CM | POA: Diagnosis not present

## 2020-03-14 DIAGNOSIS — C3491 Malignant neoplasm of unspecified part of right bronchus or lung: Secondary | ICD-10-CM

## 2020-03-14 DIAGNOSIS — I959 Hypotension, unspecified: Secondary | ICD-10-CM | POA: Diagnosis not present

## 2020-03-14 DIAGNOSIS — Z5189 Encounter for other specified aftercare: Secondary | ICD-10-CM | POA: Diagnosis not present

## 2020-03-14 DIAGNOSIS — I482 Chronic atrial fibrillation, unspecified: Secondary | ICD-10-CM | POA: Diagnosis not present

## 2020-03-14 DIAGNOSIS — I10 Essential (primary) hypertension: Secondary | ICD-10-CM | POA: Diagnosis not present

## 2020-03-14 DIAGNOSIS — Z5111 Encounter for antineoplastic chemotherapy: Secondary | ICD-10-CM | POA: Diagnosis not present

## 2020-03-14 DIAGNOSIS — E785 Hyperlipidemia, unspecified: Secondary | ICD-10-CM | POA: Diagnosis not present

## 2020-03-14 DIAGNOSIS — R110 Nausea: Secondary | ICD-10-CM | POA: Diagnosis not present

## 2020-03-14 DIAGNOSIS — D6481 Anemia due to antineoplastic chemotherapy: Secondary | ICD-10-CM | POA: Diagnosis not present

## 2020-03-14 DIAGNOSIS — J449 Chronic obstructive pulmonary disease, unspecified: Secondary | ICD-10-CM | POA: Diagnosis not present

## 2020-03-14 DIAGNOSIS — T451X5A Adverse effect of antineoplastic and immunosuppressive drugs, initial encounter: Secondary | ICD-10-CM | POA: Diagnosis not present

## 2020-03-14 DIAGNOSIS — G4730 Sleep apnea, unspecified: Secondary | ICD-10-CM | POA: Diagnosis not present

## 2020-03-14 DIAGNOSIS — M2550 Pain in unspecified joint: Secondary | ICD-10-CM | POA: Diagnosis not present

## 2020-03-14 LAB — CMP (CANCER CENTER ONLY)
ALT: 14 U/L (ref 0–44)
AST: 14 U/L — ABNORMAL LOW (ref 15–41)
Albumin: 3.2 g/dL — ABNORMAL LOW (ref 3.5–5.0)
Alkaline Phosphatase: 116 U/L (ref 38–126)
Anion gap: 8 (ref 5–15)
BUN: 20 mg/dL (ref 8–23)
CO2: 27 mmol/L (ref 22–32)
Calcium: 9.2 mg/dL (ref 8.9–10.3)
Chloride: 101 mmol/L (ref 98–111)
Creatinine: 1.39 mg/dL — ABNORMAL HIGH (ref 0.61–1.24)
GFR, Est AFR Am: 59 mL/min — ABNORMAL LOW (ref >60)
GFR, Estimated: 51 mL/min — ABNORMAL LOW (ref >60)
Glucose, Bld: 139 mg/dL — ABNORMAL HIGH (ref 70–99)
Potassium: 4.2 mmol/L (ref 3.5–5.1)
Sodium: 136 mmol/L (ref 135–145)
Total Bilirubin: 0.3 mg/dL (ref 0.3–1.2)
Total Protein: 5.9 g/dL — ABNORMAL LOW (ref 6.5–8.1)

## 2020-03-14 LAB — CBC WITH DIFFERENTIAL (CANCER CENTER ONLY)
Abs Immature Granulocytes: 1.3 10*3/uL — ABNORMAL HIGH (ref 0.00–0.07)
Basophils Absolute: 0.1 10*3/uL (ref 0.0–0.1)
Basophils Relative: 1 %
Eosinophils Absolute: 0 10*3/uL (ref 0.0–0.5)
Eosinophils Relative: 0 %
HCT: 20.1 % — ABNORMAL LOW (ref 39.0–52.0)
Hemoglobin: 6.5 g/dL — CL (ref 13.0–17.0)
Immature Granulocytes: 8 %
Lymphocytes Relative: 14 %
Lymphs Abs: 2.2 10*3/uL (ref 0.7–4.0)
MCH: 34.4 pg — ABNORMAL HIGH (ref 26.0–34.0)
MCHC: 32.3 g/dL (ref 30.0–36.0)
MCV: 106.3 fL — ABNORMAL HIGH (ref 80.0–100.0)
Monocytes Absolute: 1.5 10*3/uL — ABNORMAL HIGH (ref 0.1–1.0)
Monocytes Relative: 10 %
Neutro Abs: 10.4 10*3/uL — ABNORMAL HIGH (ref 1.7–7.7)
Neutrophils Relative %: 67 %
Platelet Count: 76 10*3/uL — ABNORMAL LOW (ref 150–400)
RBC: 1.89 MIL/uL — ABNORMAL LOW (ref 4.22–5.81)
RDW: 19.9 % — ABNORMAL HIGH (ref 11.5–15.5)
WBC Count: 15.6 10*3/uL — ABNORMAL HIGH (ref 4.0–10.5)
nRBC: 0.8 % — ABNORMAL HIGH (ref 0.0–0.2)

## 2020-03-14 LAB — PREPARE RBC (CROSSMATCH)

## 2020-03-14 MED ORDER — DIPHENHYDRAMINE HCL 25 MG PO CAPS
ORAL_CAPSULE | ORAL | Status: AC
  Filled 2020-03-14: qty 1

## 2020-03-14 MED ORDER — ACETAMINOPHEN 325 MG PO TABS
650.0000 mg | ORAL_TABLET | Freq: Once | ORAL | Status: AC
  Administered 2020-03-14: 650 mg via ORAL

## 2020-03-14 MED ORDER — DIPHENHYDRAMINE HCL 25 MG PO CAPS
25.0000 mg | ORAL_CAPSULE | Freq: Once | ORAL | Status: AC
  Administered 2020-03-14: 25 mg via ORAL

## 2020-03-14 MED ORDER — ACETAMINOPHEN 325 MG PO TABS
ORAL_TABLET | ORAL | Status: AC
  Filled 2020-03-14: qty 2

## 2020-03-14 MED ORDER — SODIUM CHLORIDE 0.9% IV SOLUTION
250.0000 mL | Freq: Once | INTRAVENOUS | Status: AC
  Administered 2020-03-14: 250 mL via INTRAVENOUS
  Filled 2020-03-14: qty 250

## 2020-03-14 NOTE — Telephone Encounter (Signed)
Pt called back is is coming back for blood transfusion.

## 2020-03-14 NOTE — Telephone Encounter (Signed)
LVM to call me back for scheduling blood transfusion.

## 2020-03-14 NOTE — Patient Instructions (Signed)

## 2020-03-16 ENCOUNTER — Other Ambulatory Visit: Payer: Self-pay | Admitting: Hematology and Oncology

## 2020-03-17 ENCOUNTER — Inpatient Hospital Stay: Payer: Medicare Other

## 2020-03-17 ENCOUNTER — Other Ambulatory Visit: Payer: Self-pay

## 2020-03-17 VITALS — BP 100/45 | HR 62 | Temp 97.9°F | Resp 18

## 2020-03-17 DIAGNOSIS — R110 Nausea: Secondary | ICD-10-CM | POA: Diagnosis not present

## 2020-03-17 DIAGNOSIS — C3411 Malignant neoplasm of upper lobe, right bronchus or lung: Secondary | ICD-10-CM | POA: Diagnosis not present

## 2020-03-17 DIAGNOSIS — G4730 Sleep apnea, unspecified: Secondary | ICD-10-CM | POA: Diagnosis not present

## 2020-03-17 DIAGNOSIS — D649 Anemia, unspecified: Secondary | ICD-10-CM

## 2020-03-17 DIAGNOSIS — C9000 Multiple myeloma not having achieved remission: Secondary | ICD-10-CM

## 2020-03-17 DIAGNOSIS — I2510 Atherosclerotic heart disease of native coronary artery without angina pectoris: Secondary | ICD-10-CM | POA: Diagnosis not present

## 2020-03-17 DIAGNOSIS — I252 Old myocardial infarction: Secondary | ICD-10-CM | POA: Diagnosis not present

## 2020-03-17 DIAGNOSIS — Z5111 Encounter for antineoplastic chemotherapy: Secondary | ICD-10-CM | POA: Diagnosis not present

## 2020-03-17 DIAGNOSIS — T451X5A Adverse effect of antineoplastic and immunosuppressive drugs, initial encounter: Secondary | ICD-10-CM | POA: Diagnosis not present

## 2020-03-17 DIAGNOSIS — M2550 Pain in unspecified joint: Secondary | ICD-10-CM | POA: Diagnosis not present

## 2020-03-17 DIAGNOSIS — I739 Peripheral vascular disease, unspecified: Secondary | ICD-10-CM | POA: Diagnosis not present

## 2020-03-17 DIAGNOSIS — I482 Chronic atrial fibrillation, unspecified: Secondary | ICD-10-CM | POA: Diagnosis not present

## 2020-03-17 DIAGNOSIS — D6481 Anemia due to antineoplastic chemotherapy: Secondary | ICD-10-CM | POA: Diagnosis not present

## 2020-03-17 DIAGNOSIS — Z5189 Encounter for other specified aftercare: Secondary | ICD-10-CM | POA: Diagnosis not present

## 2020-03-17 DIAGNOSIS — J449 Chronic obstructive pulmonary disease, unspecified: Secondary | ICD-10-CM | POA: Diagnosis not present

## 2020-03-17 DIAGNOSIS — I10 Essential (primary) hypertension: Secondary | ICD-10-CM | POA: Diagnosis not present

## 2020-03-17 DIAGNOSIS — E785 Hyperlipidemia, unspecified: Secondary | ICD-10-CM | POA: Diagnosis not present

## 2020-03-17 DIAGNOSIS — I959 Hypotension, unspecified: Secondary | ICD-10-CM | POA: Diagnosis not present

## 2020-03-17 LAB — TYPE AND SCREEN
ABO/RH(D): AB POS
Antibody Screen: NEGATIVE
Unit division: 0
Unit division: 0

## 2020-03-17 LAB — BPAM RBC
Blood Product Expiration Date: 202108042359
Blood Product Expiration Date: 202108042359
ISSUE DATE / TIME: 202107141506
Unit Type and Rh: 6200
Unit Type and Rh: 6200

## 2020-03-17 MED ORDER — ACETAMINOPHEN 325 MG PO TABS
ORAL_TABLET | ORAL | Status: AC
  Filled 2020-03-17: qty 2

## 2020-03-17 MED ORDER — DIPHENHYDRAMINE HCL 25 MG PO CAPS
ORAL_CAPSULE | ORAL | Status: AC
  Filled 2020-03-17: qty 1

## 2020-03-17 MED ORDER — DIPHENHYDRAMINE HCL 25 MG PO CAPS
25.0000 mg | ORAL_CAPSULE | Freq: Once | ORAL | Status: AC
  Administered 2020-03-17: 25 mg via ORAL

## 2020-03-17 MED ORDER — SODIUM CHLORIDE 0.9% IV SOLUTION
250.0000 mL | Freq: Once | INTRAVENOUS | Status: AC
  Administered 2020-03-17: 250 mL via INTRAVENOUS
  Filled 2020-03-17: qty 250

## 2020-03-17 MED ORDER — ACETAMINOPHEN 325 MG PO TABS
650.0000 mg | ORAL_TABLET | Freq: Once | ORAL | Status: AC
  Administered 2020-03-17: 650 mg via ORAL

## 2020-03-17 NOTE — Patient Instructions (Signed)

## 2020-03-17 NOTE — Progress Notes (Signed)
New type&screen ordered and drawn (pt came without blue armband), blood bank aware.  Pt aware to wear blue blood bracelet for future transfusion appts.

## 2020-03-19 LAB — BPAM RBC
Blood Product Expiration Date: 202108042359
ISSUE DATE / TIME: 202107170951
Unit Type and Rh: 6200

## 2020-03-19 LAB — TYPE AND SCREEN
ABO/RH(D): AB POS
Antibody Screen: NEGATIVE
Unit division: 0

## 2020-03-19 NOTE — Progress Notes (Addendum)
[facility] OFFICE PROGRESS NOTE  Luetta Nutting, DO [place]  [address] [place] [address]  DIAGNOSIS: limited stage, stage Ia (T1b, N0, M0) small cell lung cancer presented with right upper lobe pulmonary nodule that is hypermetabolic and biopsy proven to be consistent with small cell carcinoma diagnosed in April 2021.  PRIOR THERAPY: None  CURRENT THERAPY: Concurrent chemoradiation with carboplatin for an AUC of 5 on days 1 and etoposide 100 mg per metered squared on days 1, 2, and 3 IV every 3 weeks.  First dose received on 01/10/2020. Status post 3 cycles. Last dose of SBRT June 2021.   INTERVAL HISTORY: [name] 74 y.o. male returns to the clinic for a follow up visit. The patient is feeling well today without any concerning complaints. The patient received a blood transfusion last week which has improved his fatigue. He also had been having angina secondary to his anemia which has resolved since receiving a blood transfusion. The patient continues to tolerate treatment with chemotherapy well without any adverse effects except for fatigue, anemia, and arthralgias secondary to the neulasta injection. Denies any fever, chills, or weight loss. He has some mild night sweats around the collar of his shirt. Denies any chest pain, cough, or hemoptysis. He reports his baseline dyspnea on exertion. He reports mild nausea. Denies any vomiting, diarrhea, or constipation. Denies any headache or visual changes but prior to his blood transfusion, he noted changes with his vision and having a hard time focusing.  The patient is here today for evaluation prior to starting cycle # 4   MEDICAL HISTORY: Past Medical History:  Diagnosis Date  . Atypical atrial flutter ([place])   . Cancer [facility])    Testicular- surgery   . COPD (chronic obstructive pulmonary disease) ([place])   . Coronary artery disease   . Diastolic dysfunction, left ventricle 05/2018   Dr. Rayann Heman-  Cardiologist  . Dysrhythmia    chronic AFib  . History of kidney stones    passed  . HLD (hyperlipidemia)   . HTN (hypertension)   . Myocardial infarction ([place])    x 2 maybe 1 more  . OSA (obstructive sleep apnea)    uses CPAP  . Peripheral artery disease ([place])    pseudoaneurysm post afib ablation at [facility] 2011, s/p bilateral iliac stents  . Persistent atrial fibrillation ([place])   . Pre-diabetes   . Renal artery stenosis ([facility])    right renal artery PTA and stenting  . S/P coronary artery bypass graft x 7 11/16/96    ALLERGIES:  has No Known Allergies.  MEDICATIONS:  Current Outpatient Medications  Medication Sig Dispense Refill  . acetaminophen (TYLENOL) 500 MG tablet Take 1,000 mg every 6 (six) hours as needed by mouth (for pain.).    [name] atorvastatin (LIPITOR) 10 MG tablet TAKE 1 TABLET(10 MG) BY MOUTH DAILY (Patient taking differently: Take 10 mg by mouth every evening. TAKE 1 TABLET(10 MG) BY MOUTH DAILY) 90 tablet 2  . budesonide-formoterol (SYMBICORT) 160-4.5 MCG/ACT inhaler Inhale 2 puffs into the lungs 2 (two) times daily. 10.2 g 3  . cetirizine (ZYRTEC) 10 MG tablet Take 10 mg by mouth daily.    [name] dronedarone (MULTAQ) 400 MG tablet TAKE 1 TABLET BY MOUTH TWICE DAILY WITH MEAL 180 tablet 1  . empagliflozin (JARDIANCE) 10 MG TABS tablet Take 10 mg by mouth daily. Schedule appt with PCP 90 tablet 2  . furosemide (LASIX) 40 MG tablet TAKE 1 TABLET  BY MOUTH AS NEEDED (Patient taking differently: Take 40 mg by mouth daily as needed for fluid. ) 30 tablet 3  . ketotifen (ZADITOR) 0.025 % ophthalmic solution Place 1 drop into both eyes 2 (two) times daily as needed (allergies).    [name] lisinopril-hydrochlorothiazide (ZESTORETIC) 10-12.5 MG tablet TAKE 1 TABLET BY MOUTH DAILY (Patient taking differently: Take 1 tablet by mouth every evening. ) 90 tablet 1  . metoprolol succinate (TOPROL-XL) 100 MG 24 hr tablet TAKE 1 TABLET(100 MG) BY MOUTH DAILY (Patient taking differently: Take 100 mg by  mouth every evening. TAKE 1 TABLET(100 MG) BY MOUTH DAILY) 90 tablet 3  . Multiple Vitamin (MULTIVITAMIN WITH MINERALS) TABS tablet Take 1 tablet by mouth daily. In the morning.    . niacin (NIASPAN) 1000 MG CR tablet TAKE 1 TABLET(1000 MG) BY MOUTH AT BEDTIME (Patient taking differently: Take 1,000 mg by mouth at bedtime. ) 90 tablet 3  . nicotine polacrilex (COMMIT) 2 MG lozenge Take 2 mg by mouth as needed for smoking cessation.    . NON FORMULARY CPAP at night    . prochlorperazine (COMPAZINE) 10 MG tablet TAKE 1 TABLET(10 MG) BY MOUTH EVERY 6 HOURS AS NEEDED FOR NAUSEA OR VOMITING 30 tablet 0  . traMADol (ULTRAM) 50 MG tablet Take 1 tablet (50 mg total) by mouth every 12 (twelve) hours as needed. 30 tablet 0  . traZODone (DESYREL) 50 MG tablet TAKE 1/2 TO 1 TABLET BY MOUTH AT BEDTIME AS NEEDED FOR SLEEP (Patient taking differently: Take 50 mg by mouth at bedtime. ) 90 tablet 3  . VENTOLIN HFA 108 (90 Base) MCG/ACT inhaler INHALE 2 PUFFS INTO THE LUNGS EVERY 6 HOURS AS NEEDED FOR WHEEZING OR SHORTNESS OF BREATH (Patient taking differently: Inhale 2 puffs into the lungs every 6 (six) hours as needed (wheezing/shortness of breath.). ) 18 g 1  . XARELTO 20 MG TABS tablet TAKE 1 TABLET BY MOUTH EVERY DAY WITH SUPPER (Patient taking differently: Take 20 mg by mouth every evening. ) 90 tablet 1  . gabapentin (NEURONTIN) 300 MG capsule Take 2 capsules (600 mg total) by mouth at bedtime. 180 capsule 2  . nitroGLYCERIN (NITROLINGUAL) 0.4 MG/SPRAY spray Place 1 spray under the tongue every 5 (five) minutes x 3 doses as needed for chest pain. (Patient not taking: Reported on 03/20/2020) 4.9 g 2   No current facility-administered medications for this visit.   Facility-Administered Medications Ordered in Other Visits  Medication Dose Route Frequency Provider Last Rate Last Admin  . CARBOplatin (PARAPLATIN) 450 mg in sodium chloride 0.9 % 250 mL chemo infusion  450 mg Intravenous Once [name], MD       . dexamethasone (DECADRON) 10 mg in sodium chloride 0.9 % 50 mL IVPB  10 mg Intravenous Once [name], MD      . etoposide (VEPESID) 220 mg in sodium chloride 0.9 % 1,000 mL chemo infusion  100 mg/m2 (Treatment Plan Recorded) Intravenous Once [name], MD      . fosaprepitant (EMEND) 150 mg in sodium chloride 0.9 % 145 mL IVPB  150 mg Intravenous Once [name], MD        SURGICAL HISTORY:  Past Surgical History:  Procedure Laterality Date  . 2-D echocardiogram  03/25/2010   Normal left ventricular function. Mild MR, TR, trivial AR  . ATRIAL FIBRILLATION ABLATION  03/27/2010, 12/31/2010   [facility], Dr. Nadeen Landau  . BACK SURGERY  2004, 2007   Laminectomy, Discedectomy x 2  . BRONCHIAL BIOPSY  12/27/2019   Procedure: BRONCHIAL BIOPSIES;  Surgeon: Garner Nash, DO;  Location: [facility];  Service: Pulmonary;;  . BRONCHIAL BRUSHINGS  12/27/2019   Procedure: BRONCHIAL BRUSHINGS;  Surgeon: Garner Nash, DO;  Location: [facility];  Service: Pulmonary;;  . BRONCHIAL NEEDLE ASPIRATION BIOPSY  12/27/2019   Procedure: BRONCHIAL NEEDLE ASPIRATION BIOPSIES;  Surgeon: Garner Nash, DO;  Location: [facility];  Service: Pulmonary;;  . cardiac stress test  11/21/2009   Exercise capacity 5 METS. No significant ischemia demonstrated  . CARDIOVERSION N/A 03/15/2015   Procedure: CARDIOVERSION;  Surgeon: Lorretta Harp, MD;  Location: [facility] END[facility]COPY;  Service: Cardiovascular;  Laterality: N/A;  . CARDIOVERSION N/A 07/10/2017   Procedure: CARDIOVERSION;  Surgeon: Pixie Casino, MD;  Location: [facility] END[facility];  Service: Cardiovascular;  Laterality: N/A;  . CARDIOVERSION N/A 03/23/2018   Procedure: CARDIOVERSION;  Surgeon: Sanda Klein, MD;  Location: [facility];  Service: Cardiovascular;  Laterality: N/A;  . CORONARY ARTERY BYPASS GRAFT  1998  . ELECTROMAGNETIC NAVIGATION BROCHOSCOPY  12/27/2019   Procedure: NAVIGATION BRONCHOSCOPY;  Surgeon: Garner Nash, DO;  Location: [facility]  ENDOSCOPY;  Service: Pulmonary;;  . FIDUCIAL MARKER PLACEMENT  12/27/2019   Procedure: FIDUCIAL MARKER PLACEMENT;  Surgeon: Garner Nash, DO;  Location: [facility];  Service: Pulmonary;;  . ORCHIECTOMY  1981   for cancer  . TOOTH EXTRACTION  05/27/2013   tooth extraction with bone graft- several -for Implants  . VIDEO BRONCHOSCOPY WITH ENDOBRONCHIAL NAVIGATION N/A 12/27/2019   Procedure: VIDEO BRONCHOSCOPY;  Surgeon: Garner Nash, DO;  Location: [facility];  Service: Pulmonary;  Laterality: N/A;    REVIEW OF SYSTEMS:   Review of Systems  Constitutional: Positive for fatigue. Negative for appetite change, chills, fever and unexpected weight change.  HENT: Negative for mouth sores, nosebleeds, sore throat and trouble swallowing.   Eyes: Negative for eye problems and icterus.  Respiratory: Positive for dyspnea on exertion. Negative for cough, hemoptysis, and wheezing.   Cardiovascular: Negative for chest pain and leg swelling.  Gastrointestinal: Positive for mild nausea following treatment. Negative for abdominal pain, constipation, diarrhea, and vomiting.  Genitourinary: Negative for bladder incontinence, difficulty urinating, dysuria, frequency and hematuria.   Musculoskeletal: Negative for back pain, gait problem, neck pain and neck stiffness.  Skin: Negative for itching and rash.  Neurological: Negative for dizziness, extremity weakness, gait problem, headaches, light-headedness and seizures.  Hematological: Negative for adenopathy. Does not bruise/bleed easily.  Psychiatric/Behavioral: Negative for confusion, depression and sleep disturbance. The patient is not nervous/anxious.     PHYSICAL EXAMINATION:  Blood pressure (!) 90/45, pulse 78, temperature 98.1 F (36.7 C), temperature source Temporal, resp. rate 20, weight 218 lb 1.6 oz (98.9 kg), SpO2 98 %.  ECOG PERFORMANCE STATUS: 1 - Symptomatic but completely ambulatory  Physical Exam  Constitutional: Oriented to  person, place, and time and well-developed, well-nourished, and in no distress.  HENT:  Head: Normocephalic and atraumatic.  Mouth/Throat: Oropharynx is clear and moist. No oropharyngeal exudate.  Eyes: Conjunctivae are normal. Right eye exhibits no discharge. Left eye exhibits no discharge. No scleral icterus.  Neck: Normal range of motion. Neck supple.  Cardiovascular: Normal rate, regular rhythm, normal heart sounds and intact distal pulses.   Pulmonary/Chest: Effort normal and breath sounds normal. No respiratory distress. No wheezes. No rales.  Abdominal: Soft. Bowel sounds are normal. Exhibits no distension and no mass. There is no tenderness.  Musculoskeletal: Normal range of motion. Exhibits no edema.  Lymphadenopathy:    No cervical  adenopathy.  Neurological: Alert and oriented to person, place, and time. Exhibits normal muscle tone. Gait normal. Coordination normal.  Skin: Skin is warm and dry. No rash noted. Not diaphoretic. No erythema. No pallor.  Psychiatric: Mood, memory and judgment normal.  Vitals reviewed.  LABORATORY DATA: Lab Results  Component Value Date   WBC 9.7 03/20/2020   HGB 8.7 (L) 03/20/2020   HCT 27.6 (L) 03/20/2020   MCV 108.7 (H) 03/20/2020   PLT 220 03/20/2020      Chemistry      Component Value Date/Time   NA 141 03/20/2020 1007   NA 142 04/18/2010 0000   K 4.0 03/20/2020 1007   CL 104 03/20/2020 1007   CO2 27 03/20/2020 1007   BUN 13 03/20/2020 1007   CREATININE 1.42 (H) 03/20/2020 1007   CREATININE 1.41 (H) 09/14/2019 0943      Component Value Date/Time   CALCIUM 9.1 03/20/2020 1007   ALKPHOS 110 03/20/2020 1007   AST 14 (L) 03/20/2020 1007   ALT 10 03/20/2020 1007   BILITOT 0.4 03/20/2020 1007       RADIOGRAPHIC STUDIES:  No results found.   ASSESSMENT/PLAN:  This is a very pleasant 74 year old Caucasian male diagnosed with limited stage (T1b, N0,M0) non-small cell lung cancer.  He presented with a right upper lobe pulmonary  nodule that was hypermetabolic on PET scan.  It was biopsied and proven to be consistent with small cell lung cancer.  He was diagnosed in April 2021.  The patient is currently undergoing concurrent chemoradiation with carboplatin for an AUC of 5 on day 1 and etoposide 100 mg per metered squared on days 1, 2, and 3 IV every 3 weeks.  The patient is not a good candidate for cisplatin due to his renal insufficiency and other comorbidities.  The patient is status post 3 cycles.  He completed SBRT in June 2021 under the care of Dr. Lisbeth Renshaw.    Labs were reviewed.  Recommend that the patient proceed with his last cycle of chemotherapy today.    I will arrange for restaging CT scan of the chest in 1 month from now.  At the patient's creatinine is greater than 1.5, it is okay to hold the contrast..   We will see him back for a follow up visit in 4 weeks for evaluation and to review his scan results.   Regarding the patient's chemotherapy-induced anemia, he does not need a blood transfusion at this time; however, I expect that the patient will need a blood transfusion in the near future.  He will continue with weekly labs and we will arrange for a blood transfusion if his hemoglobin is less than 8.  I will add a sample to blood bank and his weekly labs for the next 3 weeks.  However, I discussed with the patient if he develops new or worsening symptoms of anemia, that he may call us sooner to recheck his blood work sooner.  The patient's blood pressure is low today.  The patient's blood pressure runs low at baseline secondary to his multiple cardiac medications.  The patient is asymptomatic and states that his blood pressure is "always like that".  The patient does not feel that he needs additional IV fluids today.  The patient was advised to call immediately if he has any concerning symptoms in the interval. The patient voices understanding of current disease status and treatment options and is in  agreement with the current care plan. All questions were answered.  The patient knows to call the clinic with any problems, questions or concerns. We can certainly see the patient much sooner if necessary      Orders Placed This Encounter  Procedures  . CT Chest W Contrast    If creatinine is above 1.5, then may do scan without contrast.    Standing Status:   Future    Standing Expiration Date:   03/20/2021    Order Specific Question:   ** REASON FOR EXAM (FREE TEXT)    Answer:   Restaging Small Cell Lung Cancer    Order Specific Question:   If indicated for the ordered procedure, I authorize the administration of contrast media per Radiology protocol    Answer:   Yes    Order Specific Question:   Preferred imaging location?    Answer:   [facility]    Order Specific Question:   Radiology Contrast Protocol - do NOT remove file path    Answer:   [email or web address]  . Sample to Blood Bank    Standing Status:   Standing    Number of Occurrences:   3    Standing Expiration Date:   03/20/2021     Isebella Upshur L Karle Desrosier, PA-C 03/20/20

## 2020-03-20 ENCOUNTER — Inpatient Hospital Stay: Payer: Medicare Other

## 2020-03-20 ENCOUNTER — Other Ambulatory Visit: Payer: Self-pay

## 2020-03-20 ENCOUNTER — Inpatient Hospital Stay: Payer: Medicare Other | Admitting: Physician Assistant

## 2020-03-20 VITALS — BP 90/45 | HR 78 | Temp 98.1°F | Resp 20 | Wt 218.1 lb

## 2020-03-20 DIAGNOSIS — R110 Nausea: Secondary | ICD-10-CM | POA: Diagnosis not present

## 2020-03-20 DIAGNOSIS — C3411 Malignant neoplasm of upper lobe, right bronchus or lung: Secondary | ICD-10-CM

## 2020-03-20 DIAGNOSIS — I252 Old myocardial infarction: Secondary | ICD-10-CM | POA: Diagnosis not present

## 2020-03-20 DIAGNOSIS — E785 Hyperlipidemia, unspecified: Secondary | ICD-10-CM | POA: Diagnosis not present

## 2020-03-20 DIAGNOSIS — I10 Essential (primary) hypertension: Secondary | ICD-10-CM | POA: Diagnosis not present

## 2020-03-20 DIAGNOSIS — T451X5A Adverse effect of antineoplastic and immunosuppressive drugs, initial encounter: Secondary | ICD-10-CM | POA: Diagnosis not present

## 2020-03-20 DIAGNOSIS — M2550 Pain in unspecified joint: Secondary | ICD-10-CM | POA: Diagnosis not present

## 2020-03-20 DIAGNOSIS — I2510 Atherosclerotic heart disease of native coronary artery without angina pectoris: Secondary | ICD-10-CM | POA: Diagnosis not present

## 2020-03-20 DIAGNOSIS — I959 Hypotension, unspecified: Secondary | ICD-10-CM | POA: Diagnosis not present

## 2020-03-20 DIAGNOSIS — C3491 Malignant neoplasm of unspecified part of right bronchus or lung: Secondary | ICD-10-CM

## 2020-03-20 DIAGNOSIS — J449 Chronic obstructive pulmonary disease, unspecified: Secondary | ICD-10-CM | POA: Diagnosis not present

## 2020-03-20 DIAGNOSIS — Z5111 Encounter for antineoplastic chemotherapy: Secondary | ICD-10-CM | POA: Diagnosis not present

## 2020-03-20 DIAGNOSIS — D6481 Anemia due to antineoplastic chemotherapy: Secondary | ICD-10-CM

## 2020-03-20 DIAGNOSIS — I739 Peripheral vascular disease, unspecified: Secondary | ICD-10-CM | POA: Diagnosis not present

## 2020-03-20 DIAGNOSIS — G4730 Sleep apnea, unspecified: Secondary | ICD-10-CM | POA: Diagnosis not present

## 2020-03-20 DIAGNOSIS — I482 Chronic atrial fibrillation, unspecified: Secondary | ICD-10-CM | POA: Diagnosis not present

## 2020-03-20 DIAGNOSIS — Z5189 Encounter for other specified aftercare: Secondary | ICD-10-CM | POA: Diagnosis not present

## 2020-03-20 LAB — CBC WITH DIFFERENTIAL (CANCER CENTER ONLY)
Abs Immature Granulocytes: 0.3 10*3/uL — ABNORMAL HIGH (ref 0.00–0.07)
Basophils Absolute: 0.1 10*3/uL (ref 0.0–0.1)
Basophils Relative: 1 %
Eosinophils Absolute: 0 10*3/uL (ref 0.0–0.5)
Eosinophils Relative: 0 %
HCT: 27.6 % — ABNORMAL LOW (ref 39.0–52.0)
Hemoglobin: 8.7 g/dL — ABNORMAL LOW (ref 13.0–17.0)
Immature Granulocytes: 3 %
Lymphocytes Relative: 17 %
Lymphs Abs: 1.6 10*3/uL (ref 0.7–4.0)
MCH: 34.3 pg — ABNORMAL HIGH (ref 26.0–34.0)
MCHC: 31.5 g/dL (ref 30.0–36.0)
MCV: 108.7 fL — ABNORMAL HIGH (ref 80.0–100.0)
Monocytes Absolute: 0.9 10*3/uL (ref 0.1–1.0)
Monocytes Relative: 10 %
Neutro Abs: 6.8 10*3/uL (ref 1.7–7.7)
Neutrophils Relative %: 69 %
Platelet Count: 220 10*3/uL (ref 150–400)
RBC: 2.54 MIL/uL — ABNORMAL LOW (ref 4.22–5.81)
RDW: 23.9 % — ABNORMAL HIGH (ref 11.5–15.5)
WBC Count: 9.7 10*3/uL (ref 4.0–10.5)
nRBC: 0.3 % — ABNORMAL HIGH (ref 0.0–0.2)

## 2020-03-20 LAB — CMP (CANCER CENTER ONLY)
ALT: 10 U/L (ref 0–44)
AST: 14 U/L — ABNORMAL LOW (ref 15–41)
Albumin: 3.4 g/dL — ABNORMAL LOW (ref 3.5–5.0)
Alkaline Phosphatase: 110 U/L (ref 38–126)
Anion gap: 10 (ref 5–15)
BUN: 13 mg/dL (ref 8–23)
CO2: 27 mmol/L (ref 22–32)
Calcium: 9.1 mg/dL (ref 8.9–10.3)
Chloride: 104 mmol/L (ref 98–111)
Creatinine: 1.42 mg/dL — ABNORMAL HIGH (ref 0.61–1.24)
GFR, Est AFR Am: 57 mL/min — ABNORMAL LOW (ref >60)
GFR, Estimated: 49 mL/min — ABNORMAL LOW (ref >60)
Glucose, Bld: 132 mg/dL — ABNORMAL HIGH (ref 70–99)
Potassium: 4 mmol/L (ref 3.5–5.1)
Sodium: 141 mmol/L (ref 135–145)
Total Bilirubin: 0.4 mg/dL (ref 0.3–1.2)
Total Protein: 6.2 g/dL — ABNORMAL LOW (ref 6.5–8.1)

## 2020-03-20 MED ORDER — PALONOSETRON HCL INJECTION 0.25 MG/5ML
0.2500 mg | Freq: Once | INTRAVENOUS | Status: AC
  Administered 2020-03-20: 0.25 mg via INTRAVENOUS

## 2020-03-20 MED ORDER — SODIUM CHLORIDE 0.9 % IV SOLN
10.0000 mg | Freq: Once | INTRAVENOUS | Status: AC
  Administered 2020-03-20: 10 mg via INTRAVENOUS
  Filled 2020-03-20: qty 10

## 2020-03-20 MED ORDER — SODIUM CHLORIDE 0.9 % IV SOLN
100.0000 mg/m2 | Freq: Once | INTRAVENOUS | Status: AC
  Administered 2020-03-20: 220 mg via INTRAVENOUS
  Filled 2020-03-20: qty 11

## 2020-03-20 MED ORDER — PALONOSETRON HCL INJECTION 0.25 MG/5ML
INTRAVENOUS | Status: AC
  Filled 2020-03-20: qty 5

## 2020-03-20 MED ORDER — SODIUM CHLORIDE 0.9 % IV SOLN
Freq: Once | INTRAVENOUS | Status: AC
  Filled 2020-03-20: qty 250

## 2020-03-20 MED ORDER — SODIUM CHLORIDE 0.9 % IV SOLN
150.0000 mg | Freq: Once | INTRAVENOUS | Status: AC
  Administered 2020-03-20: 150 mg via INTRAVENOUS
  Filled 2020-03-20: qty 150

## 2020-03-20 MED ORDER — SODIUM CHLORIDE 0.9 % IV SOLN
454.5000 mg | Freq: Once | INTRAVENOUS | Status: AC
  Administered 2020-03-20: 450 mg via INTRAVENOUS
  Filled 2020-03-20: qty 45

## 2020-03-20 NOTE — Patient Instructions (Signed)
[facility] Discharge Instructions for Patients Receiving Chemotherapy  Today you received the following chemotherapy agents: carboplatin and etoposide.   To help prevent nausea and vomiting after your treatment, we encourage you to take your nausea medication as directed.   If you develop nausea and vomiting that is not controlled by your nausea medication, call the clinic.   BELOW ARE SYMPTOMS THAT SHOULD BE REPORTED IMMEDIATELY:  *FEVER GREATER THAN 100.5 F  *CHILLS WITH OR WITHOUT FEVER  NAUSEA AND VOMITING THAT IS NOT CONTROLLED WITH YOUR NAUSEA MEDICATION  *UNUSUAL SHORTNESS OF BREATH  *UNUSUAL BRUISING OR BLEEDING  TENDERNESS IN MOUTH AND THROAT WITH OR WITHOUT PRESENCE OF ULCERS  *URINARY PROBLEMS  *BOWEL PROBLEMS  UNUSUAL RASH Items with * indicate a potential emergency and should be followed up as soon as possible.  Feel free to call the clinic should you have any questions or concerns. The clinic phone number is [phone].  Please show the [facility] at check-in to the Emergency Department and triage nurse.

## 2020-03-21 ENCOUNTER — Inpatient Hospital Stay: Payer: Medicare Other

## 2020-03-21 ENCOUNTER — Other Ambulatory Visit: Payer: Self-pay | Admitting: *Deleted

## 2020-03-21 ENCOUNTER — Telehealth: Payer: Self-pay | Admitting: Physician Assistant

## 2020-03-21 ENCOUNTER — Other Ambulatory Visit: Payer: Self-pay

## 2020-03-21 VITALS — BP 113/51 | HR 84 | Temp 98.4°F | Resp 20

## 2020-03-21 DIAGNOSIS — J449 Chronic obstructive pulmonary disease, unspecified: Secondary | ICD-10-CM | POA: Diagnosis not present

## 2020-03-21 DIAGNOSIS — C3411 Malignant neoplasm of upper lobe, right bronchus or lung: Secondary | ICD-10-CM

## 2020-03-21 DIAGNOSIS — Z5189 Encounter for other specified aftercare: Secondary | ICD-10-CM | POA: Diagnosis not present

## 2020-03-21 DIAGNOSIS — G4730 Sleep apnea, unspecified: Secondary | ICD-10-CM | POA: Diagnosis not present

## 2020-03-21 DIAGNOSIS — D6481 Anemia due to antineoplastic chemotherapy: Secondary | ICD-10-CM | POA: Diagnosis not present

## 2020-03-21 DIAGNOSIS — I959 Hypotension, unspecified: Secondary | ICD-10-CM | POA: Diagnosis not present

## 2020-03-21 DIAGNOSIS — I10 Essential (primary) hypertension: Secondary | ICD-10-CM | POA: Diagnosis not present

## 2020-03-21 DIAGNOSIS — I739 Peripheral vascular disease, unspecified: Secondary | ICD-10-CM | POA: Diagnosis not present

## 2020-03-21 DIAGNOSIS — I482 Chronic atrial fibrillation, unspecified: Secondary | ICD-10-CM | POA: Diagnosis not present

## 2020-03-21 DIAGNOSIS — M2550 Pain in unspecified joint: Secondary | ICD-10-CM | POA: Diagnosis not present

## 2020-03-21 DIAGNOSIS — I2510 Atherosclerotic heart disease of native coronary artery without angina pectoris: Secondary | ICD-10-CM | POA: Diagnosis not present

## 2020-03-21 DIAGNOSIS — R110 Nausea: Secondary | ICD-10-CM | POA: Diagnosis not present

## 2020-03-21 DIAGNOSIS — I252 Old myocardial infarction: Secondary | ICD-10-CM | POA: Diagnosis not present

## 2020-03-21 DIAGNOSIS — Z5111 Encounter for antineoplastic chemotherapy: Secondary | ICD-10-CM | POA: Diagnosis not present

## 2020-03-21 DIAGNOSIS — E785 Hyperlipidemia, unspecified: Secondary | ICD-10-CM | POA: Diagnosis not present

## 2020-03-21 DIAGNOSIS — T451X5A Adverse effect of antineoplastic and immunosuppressive drugs, initial encounter: Secondary | ICD-10-CM | POA: Diagnosis not present

## 2020-03-21 MED ORDER — SODIUM CHLORIDE 0.9 % IV SOLN
Freq: Once | INTRAVENOUS | Status: AC
  Filled 2020-03-21: qty 250

## 2020-03-21 MED ORDER — SODIUM CHLORIDE 0.9 % IV SOLN
10.0000 mg | Freq: Once | INTRAVENOUS | Status: AC
  Administered 2020-03-21: 10 mg via INTRAVENOUS
  Filled 2020-03-21: qty 10

## 2020-03-21 MED ORDER — NITROGLYCERIN 0.4 MG SL SUBL
0.4000 mg | SUBLINGUAL_TABLET | SUBLINGUAL | 3 refills | Status: AC | PRN
Start: 2020-03-21 — End: ?

## 2020-03-21 MED ORDER — SODIUM CHLORIDE 0.9 % IV SOLN
100.0000 mg/m2 | Freq: Once | INTRAVENOUS | Status: AC
  Administered 2020-03-21: 220 mg via INTRAVENOUS
  Filled 2020-03-21: qty 11

## 2020-03-21 NOTE — Telephone Encounter (Signed)
Scheduled per los. Called and left msg. Mailed printout  °

## 2020-03-21 NOTE — Patient Instructions (Signed)
[facility] Discharge Instructions for Patients Receiving Chemotherapy  Today you received the following chemotherapy agents: Etoposide.  To help prevent nausea and vomiting after your treatment, we encourage you to take your nausea medication as directed.   If you develop nausea and vomiting that is not controlled by your nausea medication, call the clinic.   BELOW ARE SYMPTOMS THAT SHOULD BE REPORTED IMMEDIATELY:  *FEVER GREATER THAN 100.5 F  *CHILLS WITH OR WITHOUT FEVER  NAUSEA AND VOMITING THAT IS NOT CONTROLLED WITH YOUR NAUSEA MEDICATION  *UNUSUAL SHORTNESS OF BREATH  *UNUSUAL BRUISING OR BLEEDING  TENDERNESS IN MOUTH AND THROAT WITH OR WITHOUT PRESENCE OF ULCERS  *URINARY PROBLEMS  *BOWEL PROBLEMS  UNUSUAL RASH Items with * indicate a potential emergency and should be followed up as soon as possible.  Feel free to call the clinic should you have any questions or concerns. The clinic phone number is [phone].  Please show the [place] at check-in to the Emergency Department and triage nurse.

## 2020-03-22 ENCOUNTER — Inpatient Hospital Stay: Payer: Medicare Other

## 2020-03-22 ENCOUNTER — Other Ambulatory Visit: Payer: Self-pay

## 2020-03-22 VITALS — BP 101/42 | HR 71 | Temp 97.9°F | Resp 18

## 2020-03-22 DIAGNOSIS — I10 Essential (primary) hypertension: Secondary | ICD-10-CM | POA: Diagnosis not present

## 2020-03-22 DIAGNOSIS — R110 Nausea: Secondary | ICD-10-CM | POA: Diagnosis not present

## 2020-03-22 DIAGNOSIS — I739 Peripheral vascular disease, unspecified: Secondary | ICD-10-CM | POA: Diagnosis not present

## 2020-03-22 DIAGNOSIS — C3411 Malignant neoplasm of upper lobe, right bronchus or lung: Secondary | ICD-10-CM

## 2020-03-22 DIAGNOSIS — Z5189 Encounter for other specified aftercare: Secondary | ICD-10-CM | POA: Diagnosis not present

## 2020-03-22 DIAGNOSIS — I2510 Atherosclerotic heart disease of native coronary artery without angina pectoris: Secondary | ICD-10-CM | POA: Diagnosis not present

## 2020-03-22 DIAGNOSIS — J449 Chronic obstructive pulmonary disease, unspecified: Secondary | ICD-10-CM | POA: Diagnosis not present

## 2020-03-22 DIAGNOSIS — I252 Old myocardial infarction: Secondary | ICD-10-CM | POA: Diagnosis not present

## 2020-03-22 DIAGNOSIS — T451X5A Adverse effect of antineoplastic and immunosuppressive drugs, initial encounter: Secondary | ICD-10-CM | POA: Diagnosis not present

## 2020-03-22 DIAGNOSIS — I482 Chronic atrial fibrillation, unspecified: Secondary | ICD-10-CM | POA: Diagnosis not present

## 2020-03-22 DIAGNOSIS — M2550 Pain in unspecified joint: Secondary | ICD-10-CM | POA: Diagnosis not present

## 2020-03-22 DIAGNOSIS — E785 Hyperlipidemia, unspecified: Secondary | ICD-10-CM | POA: Diagnosis not present

## 2020-03-22 DIAGNOSIS — I959 Hypotension, unspecified: Secondary | ICD-10-CM | POA: Diagnosis not present

## 2020-03-22 DIAGNOSIS — G4730 Sleep apnea, unspecified: Secondary | ICD-10-CM | POA: Diagnosis not present

## 2020-03-22 DIAGNOSIS — D6481 Anemia due to antineoplastic chemotherapy: Secondary | ICD-10-CM | POA: Diagnosis not present

## 2020-03-22 DIAGNOSIS — Z5111 Encounter for antineoplastic chemotherapy: Secondary | ICD-10-CM | POA: Diagnosis not present

## 2020-03-22 MED ORDER — SODIUM CHLORIDE 0.9 % IV SOLN
Freq: Once | INTRAVENOUS | Status: AC
  Filled 2020-03-22: qty 250

## 2020-03-22 MED ORDER — SODIUM CHLORIDE 0.9 % IV SOLN
10.0000 mg | Freq: Once | INTRAVENOUS | Status: AC
  Administered 2020-03-22: 10 mg via INTRAVENOUS
  Filled 2020-03-22: qty 10

## 2020-03-22 MED ORDER — SODIUM CHLORIDE 0.9 % IV SOLN
100.0000 mg/m2 | Freq: Once | INTRAVENOUS | Status: AC
  Administered 2020-03-22: 220 mg via INTRAVENOUS
  Filled 2020-03-22: qty 11

## 2020-03-22 NOTE — Patient Instructions (Signed)
[facility] Discharge Instructions for Patients Receiving Chemotherapy  Today you received the following chemotherapy agents: etoposide  To help prevent nausea and vomiting after your treatment, we encourage you to take your nausea medication as directed.   If you develop nausea and vomiting that is not controlled by your nausea medication, call the clinic.   BELOW ARE SYMPTOMS THAT SHOULD BE REPORTED IMMEDIATELY:  *FEVER GREATER THAN 100.5 F  *CHILLS WITH OR WITHOUT FEVER  NAUSEA AND VOMITING THAT IS NOT CONTROLLED WITH YOUR NAUSEA MEDICATION  *UNUSUAL SHORTNESS OF BREATH  *UNUSUAL BRUISING OR BLEEDING  TENDERNESS IN MOUTH AND THROAT WITH OR WITHOUT PRESENCE OF ULCERS  *URINARY PROBLEMS  *BOWEL PROBLEMS  UNUSUAL RASH Items with * indicate a potential emergency and should be followed up as soon as possible.  Feel free to call the clinic should you have any questions or concerns. The clinic phone number is [phone].  Please show the CHEMO ALERT CARD at check-in to the Emergency Department and triage nurse.   

## 2020-03-24 ENCOUNTER — Inpatient Hospital Stay: Payer: Medicare Other

## 2020-03-24 ENCOUNTER — Other Ambulatory Visit: Payer: Self-pay

## 2020-03-24 VITALS — BP 106/53 | HR 65 | Temp 98.9°F | Resp 17 | Ht 72.0 in

## 2020-03-24 DIAGNOSIS — I959 Hypotension, unspecified: Secondary | ICD-10-CM | POA: Diagnosis not present

## 2020-03-24 DIAGNOSIS — Z5111 Encounter for antineoplastic chemotherapy: Secondary | ICD-10-CM | POA: Diagnosis not present

## 2020-03-24 DIAGNOSIS — J449 Chronic obstructive pulmonary disease, unspecified: Secondary | ICD-10-CM | POA: Diagnosis not present

## 2020-03-24 DIAGNOSIS — I482 Chronic atrial fibrillation, unspecified: Secondary | ICD-10-CM | POA: Diagnosis not present

## 2020-03-24 DIAGNOSIS — C3411 Malignant neoplasm of upper lobe, right bronchus or lung: Secondary | ICD-10-CM | POA: Diagnosis not present

## 2020-03-24 DIAGNOSIS — D6481 Anemia due to antineoplastic chemotherapy: Secondary | ICD-10-CM | POA: Diagnosis not present

## 2020-03-24 DIAGNOSIS — M2550 Pain in unspecified joint: Secondary | ICD-10-CM | POA: Diagnosis not present

## 2020-03-24 DIAGNOSIS — I252 Old myocardial infarction: Secondary | ICD-10-CM | POA: Diagnosis not present

## 2020-03-24 DIAGNOSIS — I2510 Atherosclerotic heart disease of native coronary artery without angina pectoris: Secondary | ICD-10-CM | POA: Diagnosis not present

## 2020-03-24 DIAGNOSIS — E785 Hyperlipidemia, unspecified: Secondary | ICD-10-CM | POA: Diagnosis not present

## 2020-03-24 DIAGNOSIS — Z5189 Encounter for other specified aftercare: Secondary | ICD-10-CM | POA: Diagnosis not present

## 2020-03-24 DIAGNOSIS — T451X5A Adverse effect of antineoplastic and immunosuppressive drugs, initial encounter: Secondary | ICD-10-CM | POA: Diagnosis not present

## 2020-03-24 DIAGNOSIS — I10 Essential (primary) hypertension: Secondary | ICD-10-CM | POA: Diagnosis not present

## 2020-03-24 DIAGNOSIS — I739 Peripheral vascular disease, unspecified: Secondary | ICD-10-CM | POA: Diagnosis not present

## 2020-03-24 DIAGNOSIS — G4730 Sleep apnea, unspecified: Secondary | ICD-10-CM | POA: Diagnosis not present

## 2020-03-24 DIAGNOSIS — R110 Nausea: Secondary | ICD-10-CM | POA: Diagnosis not present

## 2020-03-24 MED ORDER — PEGFILGRASTIM-JMDB 6 MG/0.6ML ~~LOC~~ SOSY
6.0000 mg | PREFILLED_SYRINGE | Freq: Once | SUBCUTANEOUS | Status: AC
  Administered 2020-03-24: 6 mg via SUBCUTANEOUS

## 2020-03-27 ENCOUNTER — Other Ambulatory Visit: Payer: Self-pay | Admitting: *Deleted

## 2020-03-27 ENCOUNTER — Inpatient Hospital Stay: Payer: Medicare Other

## 2020-03-27 ENCOUNTER — Other Ambulatory Visit: Payer: Self-pay

## 2020-03-27 ENCOUNTER — Other Ambulatory Visit: Payer: Self-pay | Admitting: Physician Assistant

## 2020-03-27 ENCOUNTER — Telehealth: Payer: Self-pay | Admitting: Radiation Oncology

## 2020-03-27 ENCOUNTER — Telehealth: Payer: Self-pay

## 2020-03-27 DIAGNOSIS — C3411 Malignant neoplasm of upper lobe, right bronchus or lung: Secondary | ICD-10-CM | POA: Diagnosis not present

## 2020-03-27 DIAGNOSIS — M2550 Pain in unspecified joint: Secondary | ICD-10-CM | POA: Diagnosis not present

## 2020-03-27 DIAGNOSIS — D6481 Anemia due to antineoplastic chemotherapy: Secondary | ICD-10-CM

## 2020-03-27 DIAGNOSIS — I2510 Atherosclerotic heart disease of native coronary artery without angina pectoris: Secondary | ICD-10-CM | POA: Diagnosis not present

## 2020-03-27 DIAGNOSIS — G4730 Sleep apnea, unspecified: Secondary | ICD-10-CM | POA: Diagnosis not present

## 2020-03-27 DIAGNOSIS — I482 Chronic atrial fibrillation, unspecified: Secondary | ICD-10-CM | POA: Diagnosis not present

## 2020-03-27 DIAGNOSIS — J449 Chronic obstructive pulmonary disease, unspecified: Secondary | ICD-10-CM | POA: Diagnosis not present

## 2020-03-27 DIAGNOSIS — T451X5A Adverse effect of antineoplastic and immunosuppressive drugs, initial encounter: Secondary | ICD-10-CM

## 2020-03-27 DIAGNOSIS — R110 Nausea: Secondary | ICD-10-CM | POA: Diagnosis not present

## 2020-03-27 DIAGNOSIS — I739 Peripheral vascular disease, unspecified: Secondary | ICD-10-CM | POA: Diagnosis not present

## 2020-03-27 DIAGNOSIS — C3491 Malignant neoplasm of unspecified part of right bronchus or lung: Secondary | ICD-10-CM

## 2020-03-27 DIAGNOSIS — I252 Old myocardial infarction: Secondary | ICD-10-CM | POA: Diagnosis not present

## 2020-03-27 DIAGNOSIS — E785 Hyperlipidemia, unspecified: Secondary | ICD-10-CM | POA: Diagnosis not present

## 2020-03-27 DIAGNOSIS — I10 Essential (primary) hypertension: Secondary | ICD-10-CM | POA: Diagnosis not present

## 2020-03-27 DIAGNOSIS — Z5189 Encounter for other specified aftercare: Secondary | ICD-10-CM | POA: Diagnosis not present

## 2020-03-27 DIAGNOSIS — D649 Anemia, unspecified: Secondary | ICD-10-CM

## 2020-03-27 DIAGNOSIS — I959 Hypotension, unspecified: Secondary | ICD-10-CM | POA: Diagnosis not present

## 2020-03-27 DIAGNOSIS — Z5111 Encounter for antineoplastic chemotherapy: Secondary | ICD-10-CM | POA: Diagnosis not present

## 2020-03-27 LAB — CBC WITH DIFFERENTIAL (CANCER CENTER ONLY)
Abs Immature Granulocytes: 0 10*3/uL (ref 0.00–0.07)
Band Neutrophils: 2 %
Basophils Absolute: 0 10*3/uL (ref 0.0–0.1)
Basophils Relative: 0 %
Eosinophils Absolute: 0 10*3/uL (ref 0.0–0.5)
Eosinophils Relative: 0 %
HCT: 20.8 % — ABNORMAL LOW (ref 39.0–52.0)
Hemoglobin: 6.8 g/dL — CL (ref 13.0–17.0)
Lymphocytes Relative: 4 %
Lymphs Abs: 0.6 10*3/uL — ABNORMAL LOW (ref 0.7–4.0)
MCH: 35.6 pg — ABNORMAL HIGH (ref 26.0–34.0)
MCHC: 32.7 g/dL (ref 30.0–36.0)
MCV: 108.9 fL — ABNORMAL HIGH (ref 80.0–100.0)
Monocytes Absolute: 0.2 10*3/uL (ref 0.1–1.0)
Monocytes Relative: 1 %
Neutro Abs: 15.2 10*3/uL — ABNORMAL HIGH (ref 1.7–7.7)
Neutrophils Relative %: 93 %
Platelet Count: 91 10*3/uL — ABNORMAL LOW (ref 150–400)
RBC: 1.91 MIL/uL — ABNORMAL LOW (ref 4.22–5.81)
RDW: 21.4 % — ABNORMAL HIGH (ref 11.5–15.5)
WBC Count: 16 10*3/uL — ABNORMAL HIGH (ref 4.0–10.5)
nRBC: 0 % (ref 0.0–0.2)

## 2020-03-27 LAB — CMP (CANCER CENTER ONLY)
ALT: 16 U/L (ref 0–44)
AST: 15 U/L (ref 15–41)
Albumin: 3.3 g/dL — ABNORMAL LOW (ref 3.5–5.0)
Alkaline Phosphatase: 112 U/L (ref 38–126)
Anion gap: 8 (ref 5–15)
BUN: 28 mg/dL — ABNORMAL HIGH (ref 8–23)
CO2: 28 mmol/L (ref 22–32)
Calcium: 9.3 mg/dL (ref 8.9–10.3)
Chloride: 102 mmol/L (ref 98–111)
Creatinine: 1.06 mg/dL (ref 0.61–1.24)
GFR, Est AFR Am: >60 mL/min (ref >60)
GFR, Estimated: >60 mL/min (ref >60)
Glucose, Bld: 154 mg/dL — ABNORMAL HIGH (ref 70–99)
Potassium: 4.4 mmol/L (ref 3.5–5.1)
Sodium: 138 mmol/L (ref 135–145)
Total Bilirubin: 0.9 mg/dL (ref 0.3–1.2)
Total Protein: 5.6 g/dL — ABNORMAL LOW (ref 6.5–8.1)

## 2020-03-27 LAB — PREPARE RBC (CROSSMATCH)

## 2020-03-27 LAB — SAMPLE TO BLOOD BANK

## 2020-03-27 MED ORDER — DIPHENHYDRAMINE HCL 25 MG PO CAPS
25.0000 mg | ORAL_CAPSULE | Freq: Once | ORAL | Status: AC
  Administered 2020-03-27: 25 mg via ORAL

## 2020-03-27 MED ORDER — SODIUM CHLORIDE 0.9% IV SOLUTION
250.0000 mL | Freq: Once | INTRAVENOUS | Status: AC
  Administered 2020-03-27: 250 mL via INTRAVENOUS
  Filled 2020-03-27: qty 250

## 2020-03-27 MED ORDER — ACETAMINOPHEN 325 MG PO TABS
650.0000 mg | ORAL_TABLET | Freq: Once | ORAL | Status: AC
  Administered 2020-03-27: 650 mg via ORAL

## 2020-03-27 NOTE — Patient Instructions (Signed)

## 2020-03-27 NOTE — Telephone Encounter (Signed)
Critical Value HGB 6.8 Shelle Iron, RN notified

## 2020-03-28 ENCOUNTER — Encounter: Payer: Self-pay | Admitting: Internal Medicine

## 2020-03-28 LAB — BPAM RBC
Blood Product Expiration Date: 202108112359
Blood Product Expiration Date: 202108112359
ISSUE DATE / TIME: 202107271306
ISSUE DATE / TIME: 202107271306
Unit Type and Rh: 6200
Unit Type and Rh: 6200

## 2020-03-28 LAB — TYPE AND SCREEN
ABO/RH(D): AB POS
Antibody Screen: NEGATIVE
Unit division: 0
Unit division: 0

## 2020-03-28 NOTE — Telephone Encounter (Signed)
  Radiation Oncology         [phone] ________________________________  Name: [name] MRN: [id number]  Date of Service: 03/27/2020  DOB: 10-02-1945  Post Treatment Telephone Note  Diagnosis:   Limited Stage Small Cell Carcinoma of the apical RUL.  Interval Since Last Radiation:  8 weeks   01/25/20-02/01/20: The RUL target was treated to 54 Gy in 3 fractions.  Narrative:  The patient was contacted today for routine follow-up. During treatment he did very well with radiotherapy and did not have significant desquamation. He reports he is doing great from the radiotherapy but has been having trouble with his bone marrow recovering from chemotherapy.  Impression/Plan: 1. Limited Stage Small Cell Carcinoma of the apical RUL.. The patient has been doing well since completion of radiotherapy. We discussed that he will continue to follow up with Dr. Julien Nordmann in medical oncology and we will revisit further radiotherapy after his next scans. 2.         PCI.  We discussed the risks of small cell cancer patients have much higher rates of metastatic brain disease, and for this reason guidelines recommend offering prophylactic cranial irradiation. I will reach out to medical oncology to determine if he is ready to further discuss PCI versus heightened surveillance as he is not quite sure how he feels about the long term risks of whole brain radiation.     Carola Rhine, PAC

## 2020-03-28 NOTE — Progress Notes (Signed)
Second unit of blood ended during Epic down time.  VS, PIV, and unit completion information taken down manually for EMR entry later by reviewing RN.  Confirmed x2 data with RNs who finished blood transfusion on 03/27/20.  AD Melanie aware.

## 2020-04-03 ENCOUNTER — Inpatient Hospital Stay: Payer: Medicare Other | Attending: Internal Medicine

## 2020-04-03 ENCOUNTER — Telehealth: Payer: Self-pay | Admitting: Medical Oncology

## 2020-04-03 ENCOUNTER — Inpatient Hospital Stay (HOSPITAL_COMMUNITY)
Admission: EM | Admit: 2020-04-03 | Discharge: 2020-04-06 | DRG: 378 | Disposition: A | Discharge status: Home / Self Care | Payer: Medicare Other | Source: Ambulatory Visit | Attending: Family Medicine | Admitting: Family Medicine

## 2020-04-03 ENCOUNTER — Encounter (HOSPITAL_COMMUNITY): Payer: Self-pay

## 2020-04-03 ENCOUNTER — Other Ambulatory Visit: Payer: Self-pay

## 2020-04-03 DIAGNOSIS — D500 Iron deficiency anemia secondary to blood loss (chronic): Secondary | ICD-10-CM | POA: Diagnosis present

## 2020-04-03 DIAGNOSIS — Z20822 Contact with and (suspected) exposure to covid-19: Secondary | ICD-10-CM | POA: Diagnosis present

## 2020-04-03 DIAGNOSIS — K921 Melena: Secondary | ICD-10-CM | POA: Diagnosis not present

## 2020-04-03 DIAGNOSIS — T451X5A Adverse effect of antineoplastic and immunosuppressive drugs, initial encounter: Secondary | ICD-10-CM

## 2020-04-03 DIAGNOSIS — D6959 Other secondary thrombocytopenia: Secondary | ICD-10-CM | POA: Diagnosis not present

## 2020-04-03 DIAGNOSIS — D701 Agranulocytosis secondary to cancer chemotherapy: Secondary | ICD-10-CM | POA: Diagnosis present

## 2020-04-03 DIAGNOSIS — I4819 Other persistent atrial fibrillation: Secondary | ICD-10-CM | POA: Diagnosis not present

## 2020-04-03 DIAGNOSIS — K3189 Other diseases of stomach and duodenum: Secondary | ICD-10-CM | POA: Diagnosis not present

## 2020-04-03 DIAGNOSIS — D649 Anemia, unspecified: Secondary | ICD-10-CM | POA: Diagnosis not present

## 2020-04-03 DIAGNOSIS — I129 Hypertensive chronic kidney disease with stage 1 through stage 4 chronic kidney disease, or unspecified chronic kidney disease: Secondary | ICD-10-CM | POA: Diagnosis present

## 2020-04-03 DIAGNOSIS — Z800 Family history of malignant neoplasm of digestive organs: Secondary | ICD-10-CM | POA: Diagnosis not present

## 2020-04-03 DIAGNOSIS — E1151 Type 2 diabetes mellitus with diabetic peripheral angiopathy without gangrene: Secondary | ICD-10-CM | POA: Diagnosis not present

## 2020-04-03 DIAGNOSIS — I252 Old myocardial infarction: Secondary | ICD-10-CM

## 2020-04-03 DIAGNOSIS — C3411 Malignant neoplasm of upper lobe, right bronchus or lung: Secondary | ICD-10-CM | POA: Diagnosis present

## 2020-04-03 DIAGNOSIS — Z87891 Personal history of nicotine dependence: Secondary | ICD-10-CM | POA: Diagnosis not present

## 2020-04-03 DIAGNOSIS — I739 Peripheral vascular disease, unspecified: Secondary | ICD-10-CM | POA: Diagnosis present

## 2020-04-03 DIAGNOSIS — K625 Hemorrhage of anus and rectum: Secondary | ICD-10-CM | POA: Diagnosis present

## 2020-04-03 DIAGNOSIS — J449 Chronic obstructive pulmonary disease, unspecified: Secondary | ICD-10-CM | POA: Diagnosis not present

## 2020-04-03 DIAGNOSIS — K5731 Diverticulosis of large intestine without perforation or abscess with bleeding: Principal | ICD-10-CM | POA: Diagnosis present

## 2020-04-03 DIAGNOSIS — G4733 Obstructive sleep apnea (adult) (pediatric): Secondary | ICD-10-CM | POA: Diagnosis not present

## 2020-04-03 DIAGNOSIS — C3491 Malignant neoplasm of unspecified part of right bronchus or lung: Secondary | ICD-10-CM

## 2020-04-03 DIAGNOSIS — I2510 Atherosclerotic heart disease of native coronary artery without angina pectoris: Secondary | ICD-10-CM | POA: Diagnosis present

## 2020-04-03 DIAGNOSIS — Z951 Presence of aortocoronary bypass graft: Secondary | ICD-10-CM | POA: Diagnosis not present

## 2020-04-03 DIAGNOSIS — K635 Polyp of colon: Secondary | ICD-10-CM | POA: Diagnosis present

## 2020-04-03 DIAGNOSIS — K649 Unspecified hemorrhoids: Secondary | ICD-10-CM | POA: Diagnosis present

## 2020-04-03 DIAGNOSIS — D123 Benign neoplasm of transverse colon: Secondary | ICD-10-CM | POA: Diagnosis not present

## 2020-04-03 DIAGNOSIS — E1169 Type 2 diabetes mellitus with other specified complication: Secondary | ICD-10-CM | POA: Diagnosis present

## 2020-04-03 DIAGNOSIS — D6481 Anemia due to antineoplastic chemotherapy: Secondary | ICD-10-CM | POA: Diagnosis not present

## 2020-04-03 DIAGNOSIS — I480 Paroxysmal atrial fibrillation: Secondary | ICD-10-CM | POA: Diagnosis present

## 2020-04-03 DIAGNOSIS — K2970 Gastritis, unspecified, without bleeding: Secondary | ICD-10-CM | POA: Diagnosis not present

## 2020-04-03 DIAGNOSIS — Z6829 Body mass index (BMI) 29.0-29.9, adult: Secondary | ICD-10-CM

## 2020-04-03 DIAGNOSIS — Z7901 Long term (current) use of anticoagulants: Secondary | ICD-10-CM | POA: Diagnosis not present

## 2020-04-03 DIAGNOSIS — N182 Chronic kidney disease, stage 2 (mild): Secondary | ICD-10-CM | POA: Diagnosis present

## 2020-04-03 DIAGNOSIS — Z7984 Long term (current) use of oral hypoglycemic drugs: Secondary | ICD-10-CM

## 2020-04-03 DIAGNOSIS — K31819 Angiodysplasia of stomach and duodenum without bleeding: Secondary | ICD-10-CM | POA: Diagnosis not present

## 2020-04-03 DIAGNOSIS — Z1211 Encounter for screening for malignant neoplasm of colon: Secondary | ICD-10-CM | POA: Diagnosis not present

## 2020-04-03 DIAGNOSIS — K5790 Diverticulosis of intestine, part unspecified, without perforation or abscess without bleeding: Secondary | ICD-10-CM | POA: Diagnosis not present

## 2020-04-03 DIAGNOSIS — E669 Obesity, unspecified: Secondary | ICD-10-CM | POA: Diagnosis present

## 2020-04-03 DIAGNOSIS — R195 Other fecal abnormalities: Secondary | ICD-10-CM | POA: Diagnosis not present

## 2020-04-03 DIAGNOSIS — E1122 Type 2 diabetes mellitus with diabetic chronic kidney disease: Secondary | ICD-10-CM | POA: Diagnosis not present

## 2020-04-03 DIAGNOSIS — D509 Iron deficiency anemia, unspecified: Secondary | ICD-10-CM | POA: Diagnosis present

## 2020-04-03 DIAGNOSIS — E785 Hyperlipidemia, unspecified: Secondary | ICD-10-CM | POA: Diagnosis present

## 2020-04-03 DIAGNOSIS — Z806 Family history of leukemia: Secondary | ICD-10-CM

## 2020-04-03 HISTORY — DX: Type 2 diabetes mellitus without complications: E11.9

## 2020-04-03 LAB — CMP (CANCER CENTER ONLY)
ALT: 9 U/L (ref 0–44)
AST: 13 U/L — ABNORMAL LOW (ref 15–41)
Albumin: 3.3 g/dL — ABNORMAL LOW (ref 3.5–5.0)
Alkaline Phosphatase: 117 U/L (ref 38–126)
Anion gap: 12 (ref 5–15)
BUN: 20 mg/dL (ref 8–23)
CO2: 24 mmol/L (ref 22–32)
Calcium: 9.8 mg/dL (ref 8.9–10.3)
Chloride: 101 mmol/L (ref 98–111)
Creatinine: 1.29 mg/dL — ABNORMAL HIGH (ref 0.61–1.24)
GFR, Est AFR Am: >60 mL/min (ref >60)
GFR, Estimated: 55 mL/min — ABNORMAL LOW (ref >60)
Glucose, Bld: 141 mg/dL — ABNORMAL HIGH (ref 70–99)
Potassium: 3.8 mmol/L (ref 3.5–5.1)
Sodium: 137 mmol/L (ref 135–145)
Total Bilirubin: 0.5 mg/dL (ref 0.3–1.2)
Total Protein: 5.8 g/dL — ABNORMAL LOW (ref 6.5–8.1)

## 2020-04-03 LAB — IRON AND TIBC
Iron: 182 ug/dL (ref 45–182)
Saturation Ratios: 54 % — ABNORMAL HIGH (ref 17.9–39.5)
TIBC: 339 ug/dL (ref 250–450)
UIBC: 157 ug/dL

## 2020-04-03 LAB — CBC WITH DIFFERENTIAL (CANCER CENTER ONLY)
Abs Immature Granulocytes: 0.37 10*3/uL — ABNORMAL HIGH (ref 0.00–0.07)
Basophils Absolute: 0 10*3/uL (ref 0.0–0.1)
Basophils Relative: 0 %
Eosinophils Absolute: 0 10*3/uL (ref 0.0–0.5)
Eosinophils Relative: 0 %
HCT: 18.9 % — ABNORMAL LOW (ref 39.0–52.0)
Hemoglobin: 6 g/dL — CL (ref 13.0–17.0)
Immature Granulocytes: 3 %
Lymphocytes Relative: 20 %
Lymphs Abs: 2.1 10*3/uL (ref 0.7–4.0)
MCH: 34.1 pg — ABNORMAL HIGH (ref 26.0–34.0)
MCHC: 31.7 g/dL (ref 30.0–36.0)
MCV: 107.4 fL — ABNORMAL HIGH (ref 80.0–100.0)
Monocytes Absolute: 1.4 10*3/uL — ABNORMAL HIGH (ref 0.1–1.0)
Monocytes Relative: 13 %
Neutro Abs: 6.9 10*3/uL (ref 1.7–7.7)
Neutrophils Relative %: 64 %
Platelet Count: 33 10*3/uL — ABNORMAL LOW (ref 150–400)
RBC: 1.76 MIL/uL — ABNORMAL LOW (ref 4.22–5.81)
RDW: 21.7 % — ABNORMAL HIGH (ref 11.5–15.5)
WBC Count: 10.8 10*3/uL — ABNORMAL HIGH (ref 4.0–10.5)
nRBC: 1.3 % — ABNORMAL HIGH (ref 0.0–0.2)

## 2020-04-03 LAB — RETICULOCYTES
Immature Retic Fract: 41.1 % — ABNORMAL HIGH (ref 2.3–15.9)
RBC.: 1.55 MIL/uL — ABNORMAL LOW (ref 4.22–5.81)
Retic Count, Absolute: 86.8 10*3/uL (ref 19.0–186.0)
Retic Ct Pct: 5.6 % — ABNORMAL HIGH (ref 0.4–3.1)

## 2020-04-03 LAB — FERRITIN: Ferritin: 346 ng/mL — ABNORMAL HIGH (ref 24–336)

## 2020-04-03 LAB — SAMPLE TO BLOOD BANK

## 2020-04-03 LAB — VITAMIN B12: Vitamin B-12: 1361 pg/mL — ABNORMAL HIGH (ref 180–914)

## 2020-04-03 LAB — SARS CORONAVIRUS 2 BY RT PCR (HOSPITAL ORDER, PERFORMED IN ~~LOC~~ HOSPITAL LAB): SARS Coronavirus 2: NEGATIVE

## 2020-04-03 LAB — CBG MONITORING, ED: Glucose-Capillary: 107 mg/dL — ABNORMAL HIGH (ref 70–99)

## 2020-04-03 LAB — HEMOGLOBIN AND HEMATOCRIT, BLOOD
HCT: 20 % — ABNORMAL LOW (ref 39.0–52.0)
Hemoglobin: 6.4 g/dL — CL (ref 13.0–17.0)

## 2020-04-03 LAB — PREPARE RBC (CROSSMATCH)

## 2020-04-03 LAB — POC OCCULT BLOOD, ED: Fecal Occult Bld: POSITIVE — AB

## 2020-04-03 LAB — FOLATE: Folate: 15.2 ng/mL (ref >5.9)

## 2020-04-03 MED ORDER — SODIUM CHLORIDE 0.9 % IV SOLN
10.0000 mL/h | Freq: Once | INTRAVENOUS | Status: AC
  Administered 2020-04-03: 10 mL/h via INTRAVENOUS

## 2020-04-03 MED ORDER — ONDANSETRON HCL 4 MG PO TABS
4.0000 mg | ORAL_TABLET | Freq: Four times a day (QID) | ORAL | Status: DC | PRN
  Administered 2020-04-04: 4 mg via ORAL
  Filled 2020-04-03: qty 1

## 2020-04-03 MED ORDER — DRONEDARONE HCL 400 MG PO TABS
400.0000 mg | ORAL_TABLET | Freq: Two times a day (BID) | ORAL | Status: DC
  Administered 2020-04-03 – 2020-04-06 (×5): 400 mg via ORAL
  Filled 2020-04-03 (×7): qty 1

## 2020-04-03 MED ORDER — ALBUTEROL SULFATE (2.5 MG/3ML) 0.083% IN NEBU: 3.0000 mL | INHALATION_SOLUTION | Freq: Four times a day (QID) | RESPIRATORY_TRACT | Status: DC | PRN

## 2020-04-03 MED ORDER — TRAMADOL HCL 50 MG PO TABS
50.0000 mg | ORAL_TABLET | Freq: Two times a day (BID) | ORAL | Status: DC | PRN
  Administered 2020-04-04 – 2020-04-05 (×2): 50 mg via ORAL
  Filled 2020-04-03 (×2): qty 1

## 2020-04-03 MED ORDER — FLUTICASONE FUROATE-VILANTEROL 200-25 MCG/INH IN AEPB
1.0000 | INHALATION_SPRAY | Freq: Every day | RESPIRATORY_TRACT | Status: DC
  Administered 2020-04-04 – 2020-04-06 (×3): 1 via RESPIRATORY_TRACT
  Filled 2020-04-03: qty 28

## 2020-04-03 MED ORDER — METOPROLOL TARTRATE 25 MG PO TABS
12.5000 mg | ORAL_TABLET | Freq: Two times a day (BID) | ORAL | Status: DC
  Administered 2020-04-04 – 2020-04-06 (×5): 12.5 mg via ORAL
  Filled 2020-04-03 (×6): qty 1

## 2020-04-03 MED ORDER — ACETAMINOPHEN 325 MG PO TABS
650.0000 mg | ORAL_TABLET | Freq: Four times a day (QID) | ORAL | Status: DC | PRN
  Administered 2020-04-04: 650 mg via ORAL
  Filled 2020-04-03: qty 2

## 2020-04-03 MED ORDER — ADULT MULTIVITAMIN W/MINERALS CH
1.0000 | ORAL_TABLET | Freq: Every day | ORAL | Status: DC
  Administered 2020-04-04 – 2020-04-06 (×2): 1 via ORAL
  Filled 2020-04-03 (×2): qty 1

## 2020-04-03 MED ORDER — NICOTINE POLACRILEX 2 MG MT GUM
2.0000 mg | CHEWING_GUM | OROMUCOSAL | Status: DC | PRN
  Filled 2020-04-03: qty 1

## 2020-04-03 MED ORDER — INSULIN ASPART 100 UNIT/ML ~~LOC~~ SOLN
0.0000 [IU] | Freq: Four times a day (QID) | SUBCUTANEOUS | Status: DC
  Filled 2020-04-03: qty 0.06

## 2020-04-03 MED ORDER — ACETAMINOPHEN 650 MG RE SUPP: 650.0000 mg | Freq: Four times a day (QID) | RECTAL | Status: DC | PRN

## 2020-04-03 MED ORDER — LACTATED RINGERS IV SOLN: INTRAVENOUS | Status: AC

## 2020-04-03 MED ORDER — EMPAGLIFLOZIN 10 MG PO TABS
10.0000 mg | ORAL_TABLET | Freq: Every day | ORAL | Status: DC
  Administered 2020-04-04 – 2020-04-06 (×2): 10 mg via ORAL
  Filled 2020-04-03 (×4): qty 1

## 2020-04-03 MED ORDER — NITROGLYCERIN 0.4 MG SL SUBL: 0.4000 mg | SUBLINGUAL_TABLET | SUBLINGUAL | Status: DC | PRN

## 2020-04-03 MED ORDER — GABAPENTIN 300 MG PO CAPS
600.0000 mg | ORAL_CAPSULE | Freq: Every day | ORAL | Status: DC
  Administered 2020-04-03 – 2020-04-05 (×3): 600 mg via ORAL
  Filled 2020-04-03 (×3): qty 2

## 2020-04-03 MED ORDER — ONDANSETRON HCL 4 MG/2ML IJ SOLN: 4.0000 mg | Freq: Four times a day (QID) | INTRAMUSCULAR | Status: DC | PRN

## 2020-04-03 MED ORDER — OXYMETAZOLINE HCL 0.05 % NA SOLN
1.0000 | Freq: Two times a day (BID) | NASAL | Status: DC | PRN
  Administered 2020-04-04: 1 via NASAL
  Filled 2020-04-03: qty 30

## 2020-04-03 MED ORDER — PANTOPRAZOLE SODIUM 40 MG IV SOLR
40.0000 mg | Freq: Two times a day (BID) | INTRAVENOUS | Status: DC
  Administered 2020-04-03 – 2020-04-06 (×7): 40 mg via INTRAVENOUS
  Filled 2020-04-03 (×7): qty 40

## 2020-04-03 MED ORDER — TRAZODONE HCL 50 MG PO TABS
50.0000 mg | ORAL_TABLET | Freq: Every day | ORAL | Status: DC
  Administered 2020-04-03 – 2020-04-05 (×3): 50 mg via ORAL
  Filled 2020-04-03 (×3): qty 1

## 2020-04-03 MED ORDER — ATORVASTATIN CALCIUM 10 MG PO TABS
10.0000 mg | ORAL_TABLET | Freq: Every evening | ORAL | Status: DC
  Administered 2020-04-03 – 2020-04-05 (×3): 10 mg via ORAL
  Filled 2020-04-03 (×3): qty 1

## 2020-04-03 MED ORDER — NIACIN ER (ANTIHYPERLIPIDEMIC) 500 MG PO TBCR
1000.0000 mg | EXTENDED_RELEASE_TABLET | Freq: Every day | ORAL | Status: DC
  Administered 2020-04-03 – 2020-04-05 (×3): 1000 mg via ORAL
  Filled 2020-04-03 (×3): qty 2

## 2020-04-03 MED ORDER — PROCHLORPERAZINE MALEATE 10 MG PO TABS: 10.0000 mg | ORAL_TABLET | Freq: Four times a day (QID) | ORAL | Status: DC | PRN

## 2020-04-03 MED ORDER — SODIUM CHLORIDE 0.9% FLUSH
3.0000 mL | Freq: Two times a day (BID) | INTRAVENOUS | Status: DC
  Administered 2020-04-03 – 2020-04-06 (×7): 3 mL via INTRAVENOUS

## 2020-04-03 NOTE — H&P (Signed)
History and Physical    [name] XNA:[id number] DOB: May 16, 1946 DOA: 04/03/2020  PCP: Luetta Nutting, DO   Patient coming from: cancer center  Chief Complaint  Patient presents with  . Rectal Bleeding  . Hypotension     HPI: [name] is a 74 y.o. male with medical history significant for DM,HTN, LUNG CANCER RULE limited stage on chemoradiation carboplatin/etoposide and anemia needing blood transfusion comes to the ED from cancer center due to low hemoglobin. He reports he has had dark green tint stool but not black and has had noticed small amount of blood when he wipes and has known hemorrhoids. " I discount that as I had it for a long time and has gotten better in last 2 days may be from zinc oxide" He has had 5 blood transfusions in pat 1 month. He says his blood pressure runs low in 90s and is typical. Patient has nausea from chemo and had mild ot moderate chest pain ( he blames it to low hemoglobin), he has shortness of breath and DOE,generalized weakness, some headache, but  denies fever, chills, headache, focal weakness, numbness tingling, speech difficulties.  ED Course: BP soft in 90s, on RA, saturating well. Saturating well on room air. Work-up shows creatinine slightly of 1.2 previously 1.4 and 1.0, LFTs normal, ferritin 346, folate 15.2 B12 1361, hb 6.0 gm platelet count of 3000 previously 90k on 7/27 and a week before that to 220K. 2019 -. TH PRBC was ordered and admission was requested.  Review of Systems: All systems were reviewed and were negative except as mentioned in HPI above. Negative for fever Negative for cough Negative for focal weakness  Past Medical History:  Diagnosis Date  . Atypical atrial flutter ([place])   . Cancer [facility])    Testicular- surgery   . COPD (chronic obstructive pulmonary disease) ([place])   . Coronary artery disease   . Diastolic dysfunction, left ventricle 05/2018   Dr. Rayann Heman- Cardiologist  . Dysrhythmia    chronic AFib  . History  of kidney stones    passed  . HLD (hyperlipidemia)   . HTN (hypertension)   . Myocardial infarction ([place])    x 2 maybe 1 more  . OSA (obstructive sleep apnea)    uses CPAP  . Peripheral artery disease ([place])    pseudoaneurysm post afib ablation at [facility] 2011, s/p bilateral iliac stents  . Persistent atrial fibrillation ([place])   . Pre-diabetes   . Renal artery stenosis ([facility])    right renal artery PTA and stenting  . S/P coronary artery bypass graft x 7 11/16/96    Past Surgical History:  Procedure Laterality Date  . 2-D echocardiogram  03/25/2010   Normal left ventricular function. Mild MR, TR, trivial AR  . ATRIAL FIBRILLATION ABLATION  03/27/2010, 12/31/2010   [facility], Dr. Nadeen Landau  . BACK SURGERY  2004, 2007   Laminectomy, Discedectomy x 2  . BRONCHIAL BIOPSY  12/27/2019   Procedure: BRONCHIAL BIOPSIES;  Surgeon: Garner Nash, DO;  Location: [place] ENDOSCOPY;  Service: Pulmonary;;  . BRONCHIAL BRUSHINGS  12/27/2019   Procedure: BRONCHIAL BRUSHINGS;  Surgeon: Garner Nash, DO;  Location: [place];  Service: Pulmonary;;  . BRONCHIAL NEEDLE ASPIRATION BIOPSY  12/27/2019   Procedure: BRONCHIAL NEEDLE ASPIRATION BIOPSIES;  Surgeon: Garner Nash, DO;  Location: [facility];  Service: Pulmonary;;  . cardiac stress test  11/21/2009   Exercise capacity 5 METS. No significant ischemia demonstrated  . CARDIOVERSION N/A 03/15/2015  Procedure: CARDIOVERSION;  Surgeon: Lorretta Harp, MD;  Location: [facility] ENDOSCOPY;  Service: Cardiovascular;  Laterality: N/A;  . CARDIOVERSION N/A 07/10/2017   Procedure: CARDIOVERSION;  Surgeon: Pixie Casino, MD;  Location: [facility] ENDOSCOPY;  Service: Cardiovascular;  Laterality: N/A;  . CARDIOVERSION N/A 03/23/2018   Procedure: CARDIOVERSION;  Surgeon: Sanda Klein, MD;  Location: [facility] ENDOSCOPY;  Service: Cardiovascular;  Laterality: N/A;  . CORONARY ARTERY BYPASS GRAFT  1998  . ELECTROMAGNETIC NAVIGATION BROCHOSCOPY  12/27/2019   Procedure: NAVIGATION  BRONCHOSCOPY;  Surgeon: Garner Nash, DO;  Location: [place] ENDOSCOPY;  Service: Pulmonary;;  . FIDUCIAL MARKER PLACEMENT  12/27/2019   Procedure: FIDUCIAL MARKER PLACEMENT;  Surgeon: Garner Nash, DO;  Location: [place] ENDOSCOPY;  Service: Pulmonary;;  . ORCHIECTOMY  1981   for cancer  . TOOTH EXTRACTION  05/27/2013   tooth extraction with bone graft- several -for Implants  . VIDEO BRONCHOSCOPY WITH ENDOBRONCHIAL NAVIGATION N/A 12/27/2019   Procedure: VIDEO BRONCHOSCOPY;  Surgeon: Garner Nash, DO;  Location: [place];  Service: Pulmonary;  Laterality: N/A;     reports that he quit smoking about 25 years ago. His smoking use included cigarettes and cigars. He has a 45.00 pack-year smoking history. He has never used smokeless tobacco. He reports previous alcohol use. He reports that he does not use drugs.  No Known Allergies  Family History  Problem Relation Age of Onset  . Cancer Mother   . Cancer Father   . Other Brother        NO MEDICAL PROBLEMS  . Other Son        NO MEDICAL PROBLEMS  . Leukemia Daughter 6       Recover and has no other problems     Prior to Admission medications   Medication Sig Start Date End Date Taking? Authorizing Provider  acetaminophen (TYLENOL) 500 MG tablet Take 1,000 mg every 6 (six) hours as needed by mouth (for pain.).   Yes [provider]  atorvastatin (LIPITOR) 10 MG tablet TAKE 1 TABLET(10 MG) BY MOUTH DAILY Patient taking differently: Take 10 mg by mouth every evening. TAKE 1 TABLET(10 MG) BY MOUTH DAILY 07/12/19  Yes Troy Sine, MD  budesonide-formoterol [facility]) 160-4.5 MCG/ACT inhaler Inhale 2 puffs into the lungs 2 (two) times daily. 02/03/20  Yes Mannam, Praveen, MD  cetirizine (ZYRTEC) 10 MG tablet Take 10 mg by mouth daily as needed for allergies.    Yes [provider]  dronedarone (MULTAQ) 400 MG tablet TAKE 1 TABLET BY MOUTH TWICE DAILY WITH MEAL Patient taking differently: Take 400 mg by mouth 2 (two)  times daily with a meal. TAKE 1 TABLET BY MOUTH TWICE DAILY WITH MEAL 01/18/20  Yes Sherran Needs, NP  empagliflozin (JARDIANCE) 10 MG TABS tablet Take 10 mg by mouth daily. Schedule appt with PCP 11/15/19  Yes Luetta Nutting, DO  furosemide (LASIX) 40 MG tablet TAKE 1 TABLET BY MOUTH AS NEEDED Patient taking differently: Take 40 mg by mouth daily as needed for fluid.  11/08/18  Yes Sherran Needs, NP  gabapentin (NEURONTIN) 300 MG capsule Take 2 capsules (600 mg total) by mouth at bedtime. 11/15/19 04/03/20 Yes Luetta Nutting, DO  ketotifen (ZADITOR) 0.025 % ophthalmic solution Place 1 drop into both eyes 2 (two) times daily as needed (allergies).   Yes [provider]  lisinopril-hydrochlorothiazide (ZESTORETIC) 10-12.5 MG tablet TAKE 1 TABLET BY MOUTH DAILY Patient taking differently: Take 1 tablet by mouth every evening.  12/02/19  Yes  Troy Sine, MD  metoprolol succinate (TOPROL-XL) 100 MG 24 hr tablet TAKE 1 TABLET(100 MG) BY MOUTH DAILY Patient taking differently: Take 100 mg by mouth every evening. TAKE 1 TABLET(100 MG) BY MOUTH DAILY 09/20/19  Yes Lorretta Harp, MD  Multiple Vitamin (MULTIVITAMIN WITH MINERALS) TABS tablet Take 1 tablet by mouth daily. In the morning.   Yes [provider]  niacin (NIASPAN) 1000 MG CR tablet TAKE 1 TABLET(1000 MG) BY MOUTH AT BEDTIME Patient taking differently: Take 1,000 mg by mouth at bedtime.  06/24/19  Yes Troy Sine, MD  nicotine polacrilex (COMMIT) 2 MG lozenge Take 2 mg by mouth as needed for smoking cessation.   Yes [provider]  nitroGLYCERIN (NITROSTAT) 0.4 MG SL tablet Place 1 tablet (0.4 mg total) under the tongue every 5 (five) minutes as needed. 03/21/20  Yes Lorretta Harp, MD  NON FORMULARY CPAP at night   Yes [provider]  oxymetazoline (AFRIN) 0.05 % nasal spray Place 1 spray into both nostrils 2 (two) times daily as needed for congestion.   Yes [provider]  prochlorperazine  (COMPAZINE) 10 MG tablet TAKE 1 TABLET(10 MG) BY MOUTH EVERY 6 HOURS AS NEEDED FOR NAUSEA OR VOMITING Patient taking differently: Take 10 mg by mouth every 6 (six) hours as needed for nausea or vomiting.  03/01/20  Yes [name], MD  traMADol (ULTRAM) 50 MG tablet Take 1 tablet (50 mg total) by mouth every 12 (twelve) hours as needed. 01/24/20  Yes Luetta Nutting, DO  traZODone (DESYREL) 50 MG tablet TAKE 1/2 TO 1 TABLET BY MOUTH AT BEDTIME AS NEEDED FOR SLEEP Patient taking differently: Take 50 mg by mouth at bedtime.  04/26/19  Yes Gregor Hams, MD  VENTOLIN HFA 108 (90 Base) MCG/ACT inhaler INHALE 2 PUFFS INTO THE LUNGS EVERY 6 HOURS AS NEEDED FOR WHEEZING OR SHORTNESS OF BREATH Patient taking differently: Inhale 2 puffs into the lungs every 6 (six) hours as needed (wheezing/shortness of breath.).  02/24/19  Yes Gregor Hams, MD  XARELTO 20 MG TABS tablet TAKE 1 TABLET BY MOUTH EVERY DAY WITH SUPPER Patient taking differently: Take 20 mg by mouth every evening.  12/02/19  Yes Lorretta Harp, MD    Physical Exam: Vitals:   04/03/20 1207 04/03/20 1230 04/03/20 1300 04/03/20 1330  BP: (!) 94/54 (!) 100/49 (!) 93/53 (!) 98/52  Pulse: 81 78 74 76  Resp: 16 16 13 16   Temp: 98.5 F (36.9 C)     SpO2: 100% 100% 100% 99%  Weight:      Height:        General exam: AAOx3,NAD, weak appearing. HEENT:Oral mucosa moist, Ear/Nose WNL grossly, dentition normal. Respiratory system: bilaterally clear,no wheezing or crackles,no use of accessory muscle Cardiovascular system: S1 & S2 +, No JVD,. Gastrointestinal system: Abdomen soft, NT,ND, BS+ Nervous System:Alert, awake, moving extremities and grossly nonfocal Extremities: mild pitting edema, distal peripheral pulses palpable.  Skin: No rashes,no icterus. MSK: Normal muscle bulk,tone, power.  Labs on Admission: I have personally reviewed following labs and imaging studies  CBC: Recent Labs  Lab 04/03/20 1111  WBC 10.8*  NEUTROABS 6.9    HGB 6.0*  HCT 18.9*  MCV 107.4*  PLT 33*   Basic Metabolic Panel: Recent Labs  Lab 04/03/20 1111  NA 137  K 3.8  CL 101  CO2 24  GLUCOSE 141*  BUN 20  CREATININE 1.29*  CALCIUM 9.8   GFR: Estimated Creatinine Clearance: 64  mL/min (A) (by C-G formula based on SCr of 1.29 mg/dL (H)). Liver Function Tests: Recent Labs  Lab 04/03/20 1111  AST 13*  ALT 9  ALKPHOS 117  BILITOT 0.5  PROT 5.8*  ALBUMIN 3.3*   No results for input(s): LIPASE, AMYLASE in the last 168 hours. No results for input(s): AMMONIA in the last 168 hours. Coagulation Profile: No results for input(s): INR, PROTIME in the last 168 hours. Cardiac Enzymes: No results for input(s): CKTOTAL, CKMB, CKMBINDEX, TROPONINI in the last 168 hours. BNP (last 3 results) No results for input(s): PROBNP in the last 8760 hours. HbA1C: No results for input(s): HGBA1C in the last 72 hours. CBG: No results for input(s): GLUCAP in the last 168 hours. Lipid Profile: No results for input(s): CHOL, HDL, LDLCALC, TRIG, CHOLHDL, LDLDIRECT in the last 72 hours. Thyroid Function Tests: No results for input(s): TSH, T4TOTAL, FREET4, T3FREE, THYROIDAB in the last 72 hours. Anemia Panel: Recent Labs    04/03/20 1222 04/03/20 1225  VITAMINB12 1,361*  --   FOLATE 15.2  --   FERRITIN 346*  --   TIBC 339  --   IRON 182  --   RETICCTPCT  --  5.6*   Urine analysis:    Component Value Date/Time   COLORURINE YELLOW 01/25/2008 Honor 01/25/2008 1727   LABSPEC 1.018 01/25/2008 1727   PHURINE 6.5 01/25/2008 1727   GLUCOSEU NEGATIVE 01/25/2008 1727   HGBUR SMALL (A) 01/25/2008 1727   BILIRUBINUR NEGATIVE 01/25/2008 1727   KETONESUR NEGATIVE 01/25/2008 1727   PROTEINUR NEGATIVE 01/25/2008 1727   UROBILINOGEN 1.0 01/25/2008 1727   NITRITE NEGATIVE 01/25/2008 1727   LEUKOCYTESUR NEGATIVE 01/25/2008 1727    Radiological Exams on Admission: No results found.   Assessment/Plan Anemia symptomatic from  chemo, and rectal bleeding: getting 2 units PRBCs. Check posttransfusion H&H and serial H&H add PPI. GI has been notified in case he has occult GI bleeding given FOBT positive stool but patient reports history of hemorrhoids. Hold Xarelto for now.  Blood on wiping with known hemorrhoids, chronic- watch , gi is consulted as hb was very low.  Holding Xarelto  Thrombocytopenia-likely from his chemo. Monitor platelet count.  Discussed with NP Curcio/Dr Julien Nordmann advised to hold off on platelet transfusion until actively bleeding  Or plt  less than 20,000.  Paroxysmal atrial fibrillation on xarelto Hold 2/2 anemnia and until antiplatelet are above 50,000 as per oncology. Hold metoprolol home dose of 100 mg , add low dose 25 twice daily with holding parameters, resume his Multaq.  CAD status post coronary artery bypass grafting x 7, 11/16/1996/PVD: Patient reports shortness of breath and chest pain with exertion in the setting of low hemoglobin. EKG no acute ischemic finding has sinus rhythm right bundle branch, PAC.Hold anticoagulation due to hemoglobin being low.  HLD-continue his Lipitor.  OSA and COPD overlap syndrome -cpap bedtime, breo, inhalers prn, on Ra.  Type 2 diabetes mellitus with diabetic chronic kidney disease. Cont jardince, cont ssi Lab Results  Component Value Date   HGBA1C 6.7 (H) 09/14/2019   Essential Hypertension-BP soft- pt endorses it is not new.Hold acei-hctz.Monitor.  Chemotherapy induced neutropenia/THROMBOCYTOPENIA: Monitor CBC closely.  Body mass index is 29.57 kg/m.   Severity of Illness: * I certify that at the point of admission it is my clinical judgment that the patient will require inpatient hospital care spanning beyond 2 midnights from the point of admission due to high intensity of service, high risk for further deterioration and  high frequency of surveillance required.*    DVT prophylaxis: SCD Code Status:   Code Status: Full Code Family Communication:  Admission, patients condition and plan of care including tests being ordered have been discussed with the patient who indicate understanding and agree with the plan and Code Status.  Consults called:  Gi Sent message to Dr Jackson Latino MD [facility]  If 7PM-7AM, please contact night-coverage [email or web address]  04/03/2020, 1:52 PM

## 2020-04-03 NOTE — ED Provider Notes (Signed)
[facility] DEPT Provider Note   CSN: [id number] Arrival date & time: 04/03/20  1154     History Chief Complaint  Patient presents with  . Rectal Bleeding  . Hypotension    [name] is a 74 y.o. male.  HPI 74 year old male presents with anemia needing blood transfusion.  He has had 5 units over the last few weeks.  He is currently followed by oncology And getting chemotherapy.  He has noticed some blood around his anus when wiping and may be a little bit in the toilet over the last few days.  He is also had dark stool but no melena.  He feels lightheaded and short of breath with minimal exertion.  Sent here from the cancer center given concern for GI bleed.   Past Medical History:  Diagnosis Date  . Atypical atrial flutter ([place])   . Cancer [facility])    Testicular- surgery   . COPD (chronic obstructive pulmonary disease) ([place])   . Coronary artery disease   . Diastolic dysfunction, left ventricle 05/2018   Dr. Rayann Heman- Cardiologist  . Dysrhythmia    chronic AFib  . History of kidney stones    passed  . HLD (hyperlipidemia)   . HTN (hypertension)   . Myocardial infarction ([place])    x 2 maybe 1 more  . OSA (obstructive sleep apnea)    uses CPAP  . Peripheral artery disease ([place])    pseudoaneurysm post afib ablation at [facility] 2011, s/p bilateral iliac stents  . Persistent atrial fibrillation ([place])   . Pre-diabetes   . Renal artery stenosis ([facility])    right renal artery PTA and stenting  . S/P coronary artery bypass graft x 7 11/16/96    Patient Active Problem List   Diagnosis Date Noted  . Bright red rectal bleeding 04/03/2020  . Anemia associated with chemotherapy 03/20/2020  . Chemotherapy induced neutropenia ([place]) 01/24/2020  . Malignant neoplasm of right upper lobe of lung ([place]) 01/03/2020  . Encounter for antineoplastic chemotherapy 01/03/2020  . Goals of care, counseling/discussion 01/03/2020  . Lung nodule 12/27/2019  . Secondary  hypercoagulable state ([place]) 12/14/2019  . Ischemic cardiomyopathy 07/13/2019  . Type 2 diabetes mellitus with diabetic chronic kidney disease ([place]) 06/03/2017  . Chronic back pain 06/03/2017  . Left renal artery stenosis ([place]) 06/20/2016  . Anemia, iron deficiency 10/17/2015  . Hypertension associated with diabetes ([place]) 09/14/2015  . Benign hypertension with coincident congestive heart failure ([place]) 09/14/2015  . Pulmonary hypertension ([place]) 09/14/2015  . Insomnia 09/14/2015  . RLS (restless legs syndrome) 09/14/2015  . Persistent atrial fibrillation ([place])   . Coronary artery disease 05/27/2013  . Status post coronary artery bypass grafting x 7, 11/16/1996 05/27/2013  . Peripheral artery disease ([place]) 05/27/2013  . Hyperlipidemia associated with type 2 diabetes mellitus ([place]) 05/27/2013  . Paroxysmal atrial fibrillation ([place]) 11/17/2012  . Long term current use of anticoagulant therapy 11/17/2012  . Bundle branch block, right 05/24/2011  . OSA and COPD overlap syndrome ([place]) 05/24/2011    Past Surgical History:  Procedure Laterality Date  . 2-D echocardiogram  03/25/2010   Normal left ventricular function. Mild MR, TR, trivial AR  . ATRIAL FIBRILLATION ABLATION  03/27/2010, 12/31/2010   [facility], Dr. Nadeen Landau  . BACK SURGERY  2004, 2007   Laminectomy, Discedectomy x 2  . BRONCHIAL BIOPSY  12/27/2019   Procedure: BRONCHIAL BIOPSIES;  Surgeon: Garner Nash, DO;  Location: [facility];  Service: Pulmonary;;  . BRONCHIAL BRUSHINGS  12/27/2019   Procedure: BRONCHIAL BRUSHINGS;  Surgeon: Garner Nash, DO;  Location: [facility];  Service: Pulmonary;;  . BRONCHIAL NEEDLE ASPIRATION BIOPSY  12/27/2019   Procedure: BRONCHIAL NEEDLE ASPIRATION BIOPSIES;  Surgeon: Garner Nash, DO;  Location: [facility];  Service: Pulmonary;;  . cardiac stress test  11/21/2009   Exercise capacity 5 METS. No significant ischemia demonstrated  . CARDIOVERSION N/A 03/15/2015   Procedure: CARDIOVERSION;   Surgeon: Lorretta Harp, MD;  Location: [facility] ENDOSCOPY;  Service: Cardiovascular;  Laterality: N/A;  . CARDIOVERSION N/A 07/10/2017   Procedure: CARDIOVERSION;  Surgeon: Pixie Casino, MD;  Location[facility] END[facility]COPY;  Service: Cardiovascular;  Laterality: N/A;  . CARDIOVERSION N/A 03/23/2018   Procedure: CARDIOVERSION;  Surgeon: Sanda Klein, MD;  Location: [facility] ENDOSCOPY;  Service: Cardiovascular;  Laterality: N/A;  . CORONARY ARTERY BYPASS GRAFT  1998  . ELECTROMAGNETIC NAVIGATION BROCHOSCOPY  12/27/2019   Procedure: NAVIGATION BRONCHOSCOPY;  Surgeon: Garner Nash, DO;  Location: [facility] ENDOSCOPY;  Service: Pulmonary;;  . FIDUCIAL MARKER PLACEMENT  12/27/2019   Procedure: FIDUCIAL MARKER PLACEMENT;  Surgeon: Garner Nash, DO;  Location: [facility]OSCOPY;  Service: Pulmonary;;  . ORCHIECTOMY  1981   for cancer  . TOOTH EXTRACTION  05/27/2013   tooth extraction with bone graft- several -for Implants  . VIDEO BRONCHOSCOPY WITH ENDOBRONCHIAL NAVIGATION N/A 12/27/2019   Procedure: VIDEO BRONCHOSCOPY;  Surgeon: Garner Nash, DO;  Location: [facility];  Service: Pulmonary;  Laterality: N/A;       Family History  Problem Relation Age of Onset  . Cancer Mother   . Cancer Father   . Other Brother        NO MEDICAL PROBLEMS  . Other Son        NO MEDICAL PROBLEMS  . Leukemia Daughter 6       Recover and has no other problems    Social History   Tobacco Use  . Smoking status: Former Smoker    Packs/day: 1.50    Years: 30.00    Pack years: 45.00    Types: Cigarettes, Cigars    Quit date: 09/01/1994    Years since quitting: 25.6  . Smokeless tobacco: Never Used  . Tobacco comment: cigars quit  - 11/16/2019  Vaping Use  . Vaping Use: Never used  Substance Use Topics  . Alcohol use: Not Currently  . Drug use: No    Home Medications Prior to Admission medications   Medication Sig Start Date End Date Taking? Authorizing Provider  acetaminophen (TYLENOL) 500 MG tablet Take 1,000  mg every 6 (six) hours as needed by mouth (for pain.).   Yes [provider]  atorvastatin (LIPITOR) 10 MG tablet TAKE 1 TABLET(10 MG) BY MOUTH DAILY Patient taking differently: Take 10 mg by mouth every evening. TAKE 1 TABLET(10 MG) BY MOUTH DAILY 07/12/19  Yes Troy Sine, MD  budesonide-formoterol [facility]) 160-4.5 MCG/ACT inhaler Inhale 2 puffs into the lungs 2 (two) times daily. 02/03/20  Yes Mannam, Praveen, MD  cetirizine (ZYRTEC) 10 MG tablet Take 10 mg by mouth daily as needed for allergies.    Yes [provider]  dronedarone (MULTAQ) 400 MG tablet TAKE 1 TABLET BY MOUTH TWICE DAILY WITH MEAL Patient taking differently: Take 400 mg by mouth 2 (two) times daily with a meal. TAKE 1 TABLET BY MOUTH TWICE DAILY WITH MEAL 01/18/20  Yes Sherran Needs, NP  empagliflozin (JARDIANCE) 10 MG TABS tablet Take 10 mg by mouth daily. Schedule appt  with PCP 11/15/19  Yes Luetta Nutting, DO  furosemide (LASIX) 40 MG tablet TAKE 1 TABLET BY MOUTH AS NEEDED Patient taking differently: Take 40 mg by mouth daily as needed for fluid.  11/08/18  Yes Sherran Needs, NP  gabapentin (NEURONTIN) 300 MG capsule Take 2 capsules (600 mg total) by mouth at bedtime. 11/15/19 04/03/20 Yes Luetta Nutting, DO  ketotifen (ZADITOR) 0.025 % ophthalmic solution Place 1 drop into both eyes 2 (two) times daily as needed (allergies).   Yes [provider]  lisinopril-hydrochlorothiazide (ZESTORETIC) 10-12.5 MG tablet TAKE 1 TABLET BY MOUTH DAILY Patient taking differently: Take 1 tablet by mouth every evening.  12/02/19  Yes Troy Sine, MD  metoprolol succinate (TOPROL-XL) 100 MG 24 hr tablet TAKE 1 TABLET(100 MG) BY MOUTH DAILY Patient taking differently: Take 100 mg by mouth every evening. TAKE 1 TABLET(100 MG) BY MOUTH DAILY 09/20/19  Yes Lorretta Harp, MD  Multiple Vitamin (MULTIVITAMIN WITH MINERALS) TABS tablet Take 1 tablet by mouth daily. In the morning.   Yes [provider]    niacin (NIASPAN) 1000 MG CR tablet TAKE 1 TABLET(1000 MG) BY MOUTH AT BEDTIME Patient taking differently: Take 1,000 mg by mouth at bedtime.  06/24/19  Yes Troy Sine, MD  nicotine polacrilex (COMMIT) 2 MG lozenge Take 2 mg by mouth as needed for smoking cessation.   Yes [provider]  nitroGLYCERIN (NITROSTAT) 0.4 MG SL tablet Place 1 tablet (0.4 mg total) under the tongue every 5 (five) minutes as needed. 03/21/20  Yes Lorretta Harp, MD  NON FORMULARY CPAP at night   Yes [provider]  oxymetazoline (AFRIN) 0.05 % nasal spray Place 1 spray into both nostrils 2 (two) times daily as needed for congestion.   Yes [provider]  prochlorperazine (COMPAZINE) 10 MG tablet TAKE 1 TABLET(10 MG) BY MOUTH EVERY 6 HOURS AS NEEDED FOR NAUSEA OR VOMITING Patient taking differently: Take 10 mg by mouth every 6 (six) hours as needed for nausea or vomiting.  03/01/20  Yes [name], MD  traMADol (ULTRAM) 50 MG tablet Take 1 tablet (50 mg total) by mouth every 12 (twelve) hours as needed. 01/24/20  Yes Luetta Nutting, DO  traZODone (DESYREL) 50 MG tablet TAKE 1/2 TO 1 TABLET BY MOUTH AT BEDTIME AS NEEDED FOR SLEEP Patient taking differently: Take 50 mg by mouth at bedtime.  04/26/19  Yes Gregor Hams, MD  VENTOLIN HFA 108 (90 Base) MCG/ACT inhaler INHALE 2 PUFFS INTO THE LUNGS EVERY 6 HOURS AS NEEDED FOR WHEEZING OR SHORTNESS OF BREATH Patient taking differently: Inhale 2 puffs into the lungs every 6 (six) hours as needed (wheezing/shortness of breath.).  02/24/19  Yes Gregor Hams, MD  XARELTO 20 MG TABS tablet TAKE 1 TABLET BY MOUTH EVERY DAY WITH SUPPER Patient taking differently: Take 20 mg by mouth every evening.  12/02/19  Yes Lorretta Harp, MD    Allergies    Patient has no known allergies.  Review of Systems   Review of Systems  Respiratory: Positive for shortness of breath.   Gastrointestinal: Positive for blood in stool. Negative for abdominal pain.   Neurological: Positive for light-headedness.  All other systems reviewed and are negative.   Physical Exam Updated Vital Signs BP (!) 120/58   Pulse 72   Temp 98 F (36.7 C) (Oral)   Resp 14   Ht 6' (1.829 m)   Wt 98.9 kg   SpO2 97%   BMI  29.57 kg/m   Physical Exam Vitals and nursing note reviewed. Exam conducted with a chaperone present.  Constitutional:      General: He is not in acute distress.    Appearance: He is well-developed. He is not ill-appearing or diaphoretic.  HENT:     Head: Normocephalic and atraumatic.     Right Ear: External ear normal.     Left Ear: External ear normal.     Nose: Nose normal.  Eyes:     General:        Right eye: No discharge.        Left eye: No discharge.  Cardiovascular:     Rate and Rhythm: Normal rate and regular rhythm.     Heart sounds: Normal heart sounds.  Pulmonary:     Effort: Pulmonary effort is normal.     Breath sounds: Normal breath sounds. No wheezing, rhonchi or rales.  Abdominal:     Palpations: Abdomen is soft.     Tenderness: There is no abdominal tenderness.  Genitourinary:    Rectum: No external hemorrhoid. Normal anal tone.     Comments: Brown stool on DRE, no gross blood Musculoskeletal:     Cervical back: Neck supple.  Skin:    General: Skin is warm and dry.  Neurological:     Mental Status: He is alert.  Psychiatric:        Mood and Affect: Mood is not anxious.     ED Results / Procedures / Treatments   Labs (all labs ordered are listed, but only abnormal results are displayed) Labs Reviewed  VITAMIN B12 - Abnormal; Notable for the following components:      Result Value   Vitamin B-12 1,361 (*)    All other components within normal limits  IRON AND TIBC - Abnormal; Notable for the following components:   Saturation Ratios 54 (*)    All other components within normal limits  FERRITIN - Abnormal; Notable for the following components:   Ferritin 346 (*)    All other components within  normal limits  RETICULOCYTES - Abnormal; Notable for the following components:   Retic Ct Pct 5.6 (*)    RBC. 1.55 (*)    Immature Retic Fract 41.1 (*)    All other components within normal limits  POC OCCULT BLOOD, ED - Abnormal; Notable for the following components:   Fecal Occult Bld POSITIVE (*)    All other components within normal limits  SARS CORONAVIRUS 2 BY RT PCR (HOSPITAL ORDER, [facility])  FOLATE  HEMOGLOBIN AND HEMATOCRIT, BLOOD  HEMOGLOBIN AND HEMATOCRIT, BLOOD  PREPARE RBC (CROSSMATCH)  TYPE AND SCREEN    EKG None  Radiology No results found.  Procedures .Critical Care Performed by: Sherwood Gambler, MD Authorized by: Sherwood Gambler, MD   Critical care provider statement:    Critical care time (minutes):  40   Critical care time was exclusive of:  Separately billable procedures and treating other patients   Critical care was necessary to treat or prevent imminent or life-threatening deterioration of the following conditions:  Shock and circulatory failure   Critical care was time spent personally by me on the following activities:  Discussions with consultants, evaluation of patient's response to treatment, examination of patient, ordering and performing treatments and interventions, ordering and review of laboratory studies, ordering and review of radiographic studies, pulse oximetry, re-evaluation of patient's condition, obtaining history from patient or surrogate and review of old charts   (including  critical care time)  Medications Ordered in ED Medications  sodium chloride flush (NS) 0.9 % injection 3 mL (3 mLs Intravenous Given 04/03/20 1540)  lactated ringers infusion ( Intravenous New Bag/Given 04/03/20 1556)  acetaminophen (TYLENOL) tablet 650 mg (has no administration in time range)    Or  acetaminophen (TYLENOL) suppository 650 mg (has no administration in time range)  ondansetron (ZOFRAN) tablet 4 mg (has no administration  in time range)    Or  ondansetron (ZOFRAN) injection 4 mg (has no administration in time range)  pantoprazole (PROTONIX) injection 40 mg (40 mg Intravenous Given 04/03/20 1550)  atorvastatin (LIPITOR) tablet 10 mg (has no administration in time range)  fluticasone furoate-vilanterol (BREO ELLIPTA) 200-25 MCG/INH 1 puff (1 puff Inhalation Not Given 04/03/20 1407)  dronedarone (MULTAQ) tablet 400 mg (has no administration in time range)  gabapentin (NEURONTIN) capsule 600 mg (has no administration in time range)  empagliflozin (JARDIANCE) tablet 10 mg (10 mg Oral Refused 04/03/20 1632)  multivitamin with minerals tablet 1 tablet (has no administration in time range)  nicotine polacrilex (NICORETTE) gum 2 mg (has no administration in time range)  niacin (NIASPAN) CR tablet 1,000 mg (has no administration in time range)  nitroGLYCERIN (NITROSTAT) SL tablet 0.4 mg (has no administration in time range)  oxymetazoline (AFRIN) 0.05 % nasal spray 1 spray (has no administration in time range)  prochlorperazine (COMPAZINE) tablet 10 mg (has no administration in time range)  traMADol (ULTRAM) tablet 50 mg (has no administration in time range)  traZODone (DESYREL) tablet 50 mg (has no administration in time range)  albuterol (PROVENTIL) (2.5 MG/3ML) 0.083% nebulizer solution 3 mL (has no administration in time range)  insulin aspart (novoLOG) injection 0-6 Units (has no administration in time range)  metoprolol tartrate (LOPRESSOR) tablet 12.5 mg (0 mg Oral Hold 04/03/20 1555)  0.9 %  sodium chloride infusion (10 mL/hr Intravenous New Bag/Given (Non-Interop) 04/03/20 1451)    ED Course  I have reviewed the triage vital signs and the nursing notes.  Pertinent labs & imaging results that were available during my care of the patient were reviewed by me and considered in my medical decision making (see chart for details).    MDM Rules/Calculators/A&P                          Patient has some soft blood  pressures but no hypotension.  He is not ill-appearing.  He is symptomatically anemic and will be given 2 units of blood.  Sounds like some possible GI bleeding as well and gastroenterology has been consulted.  I have also discussed with oncology, Dr. Alvy Bimler, and she will help with management of his thrombocytopenia.  Hospitalist to admit. Final Clinical Impression(s) / ED Diagnoses Final diagnoses:  Symptomatic anemia    Rx / DC Orders ED Discharge Orders    None       Sherwood Gambler, MD 04/03/20 1640

## 2020-04-03 NOTE — Progress Notes (Signed)
We were called to consult on this patient, but he has been seen by Dr. Collene Mares before at [facility] and is in good standing per their office. She will consult on the patient.  Ellouise Newer, PA-C [facility]

## 2020-04-03 NOTE — ED Triage Notes (Addendum)
Pt brought over from cancer center where he was getting labs done and was sent over here due to hemoglobin of 6. Pt received 2 units of blood one week ago. Last chemo about 2 weeks ago. Pt reports bleeding from rectum, dizziness, and weakness.   BP 91/51

## 2020-04-03 NOTE — Consult Note (Signed)
Reason for Consult: Anemia, melena, and heme positive stool Referring Physician: Triad Hogan  [name] HPI: This is a 74 year old male with a PMH of RUQ small cell lung diagnosed 12/2019, COPD, CAD, afib on Xarelto, HTN, hyperlipidemia, and OSA admitted for symptomatic anemia.  The patient's symptoms started approximately one month ago.  He felt a progressive fatigue and he was transfused through oncology.  It was felt that his anemia was secondary to the chemotherapy.  In addition to the anemia he developed a thrombocytopenia.  The patient was transferred from oncology today to the ER with an HGB of 6.0 g/dL and an MCV of 107.  His last colonoscopy was with Dr. Collene Hogan in 2011 and it was a normal examination at that time.  There is a history of colon cancer in his father at the age of 25 and he died at the age of 19 from the disease.  Past Medical History:  Diagnosis Date  . Atypical atrial flutter ([place])   . Cancer [facility])    Testicular- surgery   . COPD (chronic obstructive pulmonary disease) ([place])   . Coronary artery disease   . Diastolic dysfunction, left ventricle 05/2018   [name]- Cardiologist  . Dysrhythmia    chronic AFib  . History of kidney stones    passed  . HLD (hyperlipidemia)   . HTN (hypertension)   . Myocardial infarction ([place])    x 2 maybe 1 more  . OSA (obstructive sleep apnea)    uses CPAP  . Peripheral artery disease ([place])    pseudoaneurysm post afib ablation at [facility] 2011, s/p bilateral iliac stents  . Persistent atrial fibrillation ([place])   . Pre-diabetes   . Renal artery stenosis ([facility])    right renal artery PTA and stenting  . S/P coronary artery bypass graft x 7 11/16/96    Past Surgical History:  Procedure Laterality Date  . 2-D echocardiogram  03/25/2010   Normal left ventricular function. Mild MR, TR, trivial AR  . ATRIAL FIBRILLATION ABLATION  03/27/2010, 12/31/2010   [facility], Dr. Nadeen Hogan  . BACK SURGERY  2004, 2007   Laminectomy, Discedectomy  x 2  . BRONCHIAL BIOPSY  12/27/2019   Procedure: BRONCHIAL BIOPSIES;  Surgeon: [name];  Location: [place] ENDOSCOPY;  Service: Pulmonary;;  . BRONCHIAL BRUSHINGS  12/27/2019   Procedure: BRONCHIAL BRUSHINGS;  Surgeon: [name];  Location: [place];  Service: Pulmonary;;  . BRONCHIAL NEEDLE ASPIRATION BIOPSY  12/27/2019   Procedure: BRONCHIAL NEEDLE ASPIRATION BIOPSIES;  Surgeon: [name];  Location: [facility];  Service: Pulmonary;;  . cardiac stress test  11/21/2009   Exercise capacity 5 METS. No significant ischemia demonstrated  . CARDIOVERSION N/A 03/15/2015   Procedure: CARDIOVERSION;  Surgeon: Richard Harp, MD;  Location: [facility] ENDOSCOPY;  Service: Cardiovascular;  Laterality: N/A;  . CARDIOVERSION N/A 07/10/2017   Procedure: CARDIOVERSION;  Surgeon: Richard Casino, MD;  Location: [facility] ENDOSCOPY;  Service: Cardiovascular;  Laterality: N/A;  . CARDIOVERSION N/A 03/23/2018   Procedure: CARDIOVERSION;  Surgeon: Richard Klein, MD;  Location: [facility] ENDOSCOPY;  Service: Cardiovascular;  Laterality: N/A;  . CORONARY ARTERY BYPASS GRAFT  1998  . ELECTROMAGNETIC NAVIGATION BROCHOSCOPY  12/27/2019   Procedure: NAVIGATION BRONCHOSCOPY;  Surgeon: [name];  Location: [place] ENDOSCOPY;  Service: Pulmonary;;  . FIDUCIAL MARKER PLACEMENT  12/27/2019   Procedure: FIDUCIAL MARKER PLACEMENT;  Surgeon: [name];  Location: [place] ENDOSCOPY;  Service: Pulmonary;;  .  ORCHIECTOMY  1981   for cancer  . TOOTH EXTRACTION  05/27/2013   tooth extraction with bone graft- several -for Implants  . VIDEO BRONCHOSCOPY WITH ENDOBRONCHIAL NAVIGATION N/A 12/27/2019   Procedure: VIDEO BRONCHOSCOPY;  Surgeon: [name];  Location: [place];  Service: Pulmonary;  Laterality: N/A;    Family History  Problem Relation Age of Onset  . Cancer Mother   . Cancer Father   . Other Brother        NO MEDICAL PROBLEMS  . Other Son        NO MEDICAL PROBLEMS  .  Leukemia Daughter 6       Recover and has no other problems    Social History:  reports that he quit smoking about 25 years ago. His smoking use included cigarettes and cigars. He has a 45.00 pack-year smoking history. He has never used smokeless tobacco. He reports previous alcohol use. He reports that he does not use drugs.  Allergies: No Known Allergies  Medications:  Scheduled: . atorvastatin  10 mg Oral QPM  . dronedarone  400 mg Oral BID WC  . empagliflozin  10 mg Oral Daily  . fluticasone furoate-vilanterol  1 puff Inhalation Daily  . gabapentin  600 mg Oral QHS  . insulin aspart  0-6 Units Subcutaneous Q6H  . metoprolol tartrate  12.5 mg Oral BID  . [START ON 04/04/2020] multivitamin with minerals  1 tablet Oral Daily  . niacin  1,000 mg Oral QHS  . pantoprazole (PROTONIX) IV  40 mg Intravenous Q12H  . sodium chloride flush  3 mL Intravenous Q12H  . traZODone  50 mg Oral QHS   Continuous: . lactated ringers 50 mL/hr at 04/03/20 1556    Results for orders placed or performed during the hospital encounter of 04/03/20 (from the past 24 hour(s))  Type and screen [name]     Status: None (Preliminary result)   Collection Time: 04/03/20 11:11 AM  Result Value Ref Range   ABO/RH(D) AB POS    Antibody Screen NEG    Sample Expiration 04/06/2020,2359    Unit Number [id number]    Blood Component Type RED CELLS,LR    Unit division 00    Status of Unit ISSUED    Transfusion Status OK TO TRANSFUSE    Crossmatch Result      Compatible Performed at [facility], [place] [address], [place], [place] [address]    Unit Number [id number]    Blood Component Type RED CELLS,LR    Unit division 00    Status of Unit ALLOCATED    Transfusion Status OK TO TRANSFUSE    Crossmatch Result Compatible   Prepare RBC (crossmatch)     Status: None   Collection Time: 04/03/20 12:21 PM  Result Value Ref Range   Order Confirmation      ORDER PROCESSED  BY BLOOD BANK Performed at [facility], [place] [address] [place], [place] [address]   SARS Coronavirus 2 by RT PCR (hospital order, performed in [facility] hospital lab) Nasopharyngeal Nasopharyngeal Swab     Status: None   Collection Time: 04/03/20 12:21 PM   Specimen: Nasopharyngeal Swab  Result Value Ref Range   SARS Coronavirus 2 NEGATIVE NEGATIVE  Vitamin B12     Status: Abnormal   Collection Time: 04/03/20 12:22 PM  Result Value Ref Range   Vitamin B-12 1,361 (H) 180 - 914 pg/mL  Folate     Status: None  Collection Time: 04/03/20 12:22 PM  Result Value Ref Range   Folate 15.2 >5.9 ng/mL  Iron and TIBC     Status: Abnormal   Collection Time: 04/03/20 12:22 PM  Result Value Ref Range   Iron 182 45 - 182 ug/dL   TIBC 339 250 - 450 ug/dL   Saturation Ratios 54 (H) 17.9 - 39.5 %   UIBC 157 ug/dL  Ferritin     Status: Abnormal   Collection Time: 04/03/20 12:22 PM  Result Value Ref Range   Ferritin 346 (H) 24 - 336 ng/mL  Reticulocytes     Status: Abnormal   Collection Time: 04/03/20 12:25 PM  Result Value Ref Range   Retic Ct Pct 5.6 (H) 0.4 - 3.1 %   RBC. 1.55 (L) 4.22 - 5.81 MIL/uL   Retic Count, Absolute 86.8 19.0 - 186.0 K/uL   Immature Retic Fract 41.1 (H) 2.3 - 15.9 %  POC occult blood, ED     Status: Abnormal   Collection Time: 04/03/20 12:27 PM  Result Value Ref Range   Fecal Occult Bld POSITIVE (A) NEGATIVE     No results found.  ROS:  As stated above in the HPI otherwise negative.  Blood pressure (!) 101/56, pulse 75, temperature 98 F (36.7 C), temperature source Oral, resp. rate 15, height 6' (1.829 m), weight 98.9 kg, SpO2 100 %.    PE: Gen: NAD, Alert and Oriented HEENT:  [place]/AT, EOMI Neck: Supple, no LAD Lungs: CTA Bilaterally CV: RRR without M/G/R ABD: Soft, NTND, +BS, obese Ext: No C/C/E Rectal: melenic-type stool, heme positive, no masses palpated  Assessment/Plan: 1) Melena. 2) Heme positive stool. 3) Small cell lung  cancer of the RUL. 4) Thrombocytopenia. 5) Afib on Xarelto, which is being held.   The patient does exhibit a GI source of bleeding in addition to the thrombocytopenia and anemia secondary to chemotherapy.  He does have a family history of colon cancer in his father and his last colonoscopy was in 2011.  He is currently stable and receiving blood transfusions.  Further endoscopic evaluation will be necessary.  Plan: 1) Agree with the blood transfusions. 2) Enteroscopy and colonoscopy with Dr. Collene Hogan tomorrow.  Epiphany Seltzer D 04/03/2020, 4:15 PM

## 2020-04-03 NOTE — Progress Notes (Signed)
HEMATOLOGY-ONCOLOGY PROGRESS NOTE  SUBJECTIVE: [name] was seen in the emergency room today.  He had labs performed earlier today at the cancer center which showed a WBC of 10.8, hemoglobin 6.0, and platelet count of 33,000.  Creatinine was up slightly to 1.29.  The patient told nursing that he had bright red blood per rectum over the weekend.  He was advised to go to the emergency room for treatment of his anemia and further evaluation.  When I saw him today, his first unit PRBCs is hanging.  He reported that he has some bright red blood per rectum on Sunday and Monday, but now his stools are dark, but not black.  He states they appear to more green in color.  Stool for occult blood was checked here in the ER and was positive.  He states that he has been taking Xarelto which has now been placed on hold.  Last dose was on 04/02/2020 in the evening.  He reports that he has been having some symptoms associated with his anemia including fatigue, intermittent chest discomfort, and dyspnea.  He denies any other bleeding such as epistaxis, hemoptysis, hematemesis, and hematuria.  However, he does state that his urine appears darker.  The patient started his last cycle of chemotherapy on 03/20/2020.  He received carboplatin for an AUC of 5 on day 1 and etoposide 100 mg meter squared on days 1 through 3.  He also received G-CSF on 03/24/2020.  The patient had weekly labs performed on 03/27/2020 and was noted to have a hemoglobin of 6.8.  He received units PRBCs in our office on that date.  Oncology History  Malignant neoplasm of right upper lobe of lung ([place])  01/03/2020 Initial Diagnosis   Small cell lung cancer, right ([place])   01/10/2020 -  Chemotherapy   The patient had palonosetron (ALOXI) injection 0.25 mg, 0.25 mg, Intravenous,  Once, 4 of 4 cycles Administration: 0.25 mg (01/10/2020), 0.25 mg (02/07/2020), 0.25 mg (02/28/2020), 0.25 mg (03/20/2020) pegfilgrastim-jmdb (FULPHILA) injection 6 mg, 6 mg, Subcutaneous,   Once, 3 of 3 cycles Administration: 6 mg (02/11/2020), 6 mg (03/03/2020), 6 mg (03/24/2020) CARBOplatin (PARAPLATIN) 460 mg in sodium chloride 0.9 % 250 mL chemo infusion, 460 mg (100 % of original dose 459.5 mg), Intravenous,  Once, 4 of 4 cycles Dose modification: 459.5 mg (original dose 459.5 mg, Cycle 1), 419.5 mg (original dose 419.5 mg, Cycle 2), 457 mg (original dose 457 mg, Cycle 3), 454.5 mg (original dose 454.5 mg, Cycle 4) Administration: 460 mg (01/10/2020), 420 mg (02/07/2020), 430 mg (02/28/2020), 450 mg (03/20/2020) etoposide (VEPESID) 220 mg in sodium chloride 0.9 % 1,000 mL chemo infusion, 100 mg/m2 = 220 mg, Intravenous,  Once, 4 of 4 cycles Administration: 220 mg (01/10/2020), 220 mg (01/11/2020), 220 mg (01/12/2020), 220 mg (02/07/2020), 220 mg (02/08/2020), 220 mg (02/09/2020), 220 mg (02/28/2020), 220 mg (02/29/2020), 220 mg (03/01/2020), 220 mg (03/20/2020), 220 mg (03/21/2020), 220 mg (03/22/2020) fosaprepitant (EMEND) 150 mg in sodium chloride 0.9 % 145 mL IVPB, 150 mg, Intravenous,  Once, 4 of 4 cycles Administration: 150 mg (01/10/2020), 150 mg (02/07/2020), 150 mg (02/28/2020), 150 mg (03/20/2020)  for chemotherapy treatment.       REVIEW OF SYSTEMS:   Constitutional: Denies fevers, chills Eyes: Denies blurriness of vision Ears, nose, mouth, throat, and face: Denies mucositis or sore throat Respiratory: Reports dyspnea with exertion Cardiovascular: Reports that he has had some intermittent chest pain requiring as needed nitroglycerin Gastrointestinal:  Denies nausea, heartburn.  Had  bright red blood per rectum a few days ago but now resolved. Skin: Denies abnormal skin rashes Lymphatics: Denies new lymphadenopathy or easy bruising Neurological:Denies numbness, tingling or new weaknesses Behavioral/Psych: Mood is stable, no new changes  Extremities: No lower extremity edema All other systems were reviewed with the patient and are negative.  I have reviewed the past medical history, past  surgical history, social history and family history with the patient and they are unchanged from previous note.   PHYSICAL EXAMINATION: ECOG PERFORMANCE STATUS: 1 - Symptomatic but completely ambulatory  Vitals:   04/03/20 1448 04/03/20 1506  BP: (!) 108/57 (!) 106/59  Pulse: 78 77  Resp: 20 16  Temp: 98.1 F (36.7 C) 98 F (36.7 C)  SpO2: 98% 100%   Filed Weights   04/03/20 1158  Weight: 98.9 kg    Intake/Output from previous day: No intake/output data recorded.  GENERAL:alert, no distress and comfortable SKIN: skin color, texture, turgor are normal, no rashes or significant lesions EYES: normal, Conjunctiva are pink and non-injected, sclera clear OROPHARYNX:no exudate, no erythema and lips, buccal mucosa, and tongue normal  NECK: supple, thyroid normal size, non-tender, without nodularity LYMPH:  no palpable lymphadenopathy in the cervical, axillary or inguinal LUNGS: clear to auscultation and percussion with normal breathing effort HEART: regular rate & rhythm and no murmurs and no lower extremity edema ABDOMEN:abdomen soft, non-tender and normal bowel sounds Musculoskeletal:no cyanosis of digits and no clubbing  NEURO: alert & oriented x 3 with fluent speech, no focal motor/sensory deficits  LABORATORY DATA:  I have reviewed the data as listed CMP Latest Ref Rng & Units 04/03/2020 03/27/2020 03/20/2020  Glucose 70 - 99 mg/dL 141(H) 154(H) 132(H)  BUN 8 - 23 mg/dL 20 28(H) 13  Creatinine 0.61 - 1.24 mg/dL 1.29(H) 1.06 1.42(H)  Sodium 135 - 145 mmol/L 137 138 141  Potassium 3.5 - 5.1 mmol/L 3.8 4.4 4.0  Chloride 98 - 111 mmol/L 101 102 104  CO2 22 - 32 mmol/L 24 28 27   Calcium 8.9 - 10.3 mg/dL 9.8 9.3 9.1  Total Protein 6.5 - 8.1 g/dL 5.8(L) 5.6(L) 6.2(L)  Total Bilirubin 0.3 - 1.2 mg/dL 0.5 0.9 0.4  Alkaline Phos 38 - 126 U/L 117 112 110  AST 15 - 41 U/L 13(L) 15 14(L)  ALT 0 - 44 U/L 9 16 10     Lab Results  Component Value Date   WBC 10.8 (H) 04/03/2020   HGB  6.0 (LL) 04/03/2020   HCT 18.9 (L) 04/03/2020   MCV 107.4 (H) 04/03/2020   PLT 33 (L) 04/03/2020   NEUTROABS 6.9 04/03/2020    No results found.  ASSESSMENT AND PLAN: This is a very pleasant 74 year old Caucasian male with limited stage (T1b, N0, M0) small cell lung cancer.  He presented with a right upper lobe pulmonary nodule that was hypermetabolic on PET scan which was biopsied and proven to be consistent with small cell lung cancer.  He was diagnosed in April 2021.  He is currently receiving concurrent chemoradiation with carboplatin for an AUC of 5 on day 1 and etoposide 100 mg/m on days 1, 2, and 3 IV every 3 weeks.  Status post 4 cycles of chemotherapy.  He also completed SBRT in June 2021 under the care of Dr. Lisbeth Renshaw.  The patient had routine weekly labs checked in our office today which showed significant anemia and thrombocytopenia.  He received 2 units PRBCs in our office on 03/27/2020 for hemoglobin 6.8.  Hemoglobin today is lower  than last week.  Anemia is in part due to bone marrow suppression from chemotherapy, but he reports having bright red blood per rectum recently and has heme positive stools as well.  Recommend GI evaluation.  Hold Xarelto until platelet count is 50,000 or higher.  Transfuse PRBCs for hemoglobin less than 8 and transfuse platelets for platelet count less than 20,000.  Agree with giving 2 units PRBCs today and will hold on platelet transfusion.   LOS: 0 days   Mikey Bussing, DNP, AGPCNP-BC, AOCNP 04/03/20

## 2020-04-03 NOTE — Telephone Encounter (Signed)
Hgb 6.0 today . Blood sample sent to Blood bank. Pt reports he had BRB per rectum approx 5 days ago.  Plan -Per Julien Nordmann pt needs to be evaluated in ED. Called report to ED-- Pt wheeled to ED room 4.   7/27-received 2 units of blood.  7/21-last  chemo

## 2020-04-04 LAB — BASIC METABOLIC PANEL
Anion gap: 10 (ref 5–15)
BUN: 17 mg/dL (ref 8–23)
CO2: 29 mmol/L (ref 22–32)
Calcium: 8.8 mg/dL — ABNORMAL LOW (ref 8.9–10.3)
Chloride: 99 mmol/L (ref 98–111)
Creatinine, Ser: 1.06 mg/dL (ref 0.61–1.24)
GFR calc Af Amer: >60 mL/min (ref >60)
GFR calc non Af Amer: >60 mL/min (ref >60)
Glucose, Bld: 136 mg/dL — ABNORMAL HIGH (ref 70–99)
Potassium: 3.7 mmol/L (ref 3.5–5.1)
Sodium: 138 mmol/L (ref 135–145)

## 2020-04-04 LAB — GLUCOSE, CAPILLARY
Glucose-Capillary: 109 mg/dL — ABNORMAL HIGH (ref 70–99)
Glucose-Capillary: 105 mg/dL — ABNORMAL HIGH (ref 70–99)

## 2020-04-04 LAB — CBG MONITORING, ED
Glucose-Capillary: 113 mg/dL — ABNORMAL HIGH (ref 70–99)
Glucose-Capillary: 100 mg/dL — ABNORMAL HIGH (ref 70–99)
Glucose-Capillary: 131 mg/dL — ABNORMAL HIGH (ref 70–99)
Glucose-Capillary: 120 mg/dL — ABNORMAL HIGH (ref 70–99)

## 2020-04-04 LAB — CBC
HCT: 22.2 % — ABNORMAL LOW (ref 39.0–52.0)
Hemoglobin: 7.2 g/dL — ABNORMAL LOW (ref 13.0–17.0)
MCH: 33.2 pg (ref 26.0–34.0)
MCHC: 32.4 g/dL (ref 30.0–36.0)
MCV: 102.3 fL — ABNORMAL HIGH (ref 80.0–100.0)
Platelets: 36 10*3/uL — ABNORMAL LOW (ref 150–400)
RBC: 2.17 MIL/uL — ABNORMAL LOW (ref 4.22–5.81)
RDW: 22.8 % — ABNORMAL HIGH (ref 11.5–15.5)
WBC: 10.6 10*3/uL — ABNORMAL HIGH (ref 4.0–10.5)
nRBC: 1.6 % — ABNORMAL HIGH (ref 0.0–0.2)

## 2020-04-04 LAB — PREPARE RBC (CROSSMATCH)

## 2020-04-04 MED ORDER — PEG 3350-KCL-NA BICARB-NACL 420 G PO SOLR
4000.0000 mL | Freq: Once | ORAL | Status: AC
  Administered 2020-04-04: 4000 mL via ORAL

## 2020-04-04 MED ORDER — SODIUM CHLORIDE 0.9% IV SOLUTION: Freq: Once | INTRAVENOUS | Status: AC

## 2020-04-04 NOTE — Progress Notes (Signed)
PROGRESS NOTE    Patient: [name]                            PCP: Luetta Nutting, DO                    DOB: 22-Feb-1946            DOA: 04/03/2020 EXB:[id number]             DOS: 04/04/2020, 11:05 AM   LOS: 1 day   Date of Service: The patient was seen and examined on 04/04/2020  Subjective:   The patient was seen and examined this Am. Stable --- reports of no active bleeding Reports that he had breakfast, was not told about any endoscopy was not offered any GI prep overnight.  Brief Narrative:  Per HPI: [name] is a 74 y.o. male w PMH/o DM,HTN, LUNG CANCER RULE limited stage on chemoradiation carboplatin/etoposide and anemia needing blood transfusion comes to the ED from cancer center due to low hemoglobin of 6.0 .   He reports he has had dark green tint stool but not black and has had noticed small amount of blood when he wipes and has known hemorrhoids.  Per patient status post 5 blood transfusion over 1 month.  Patient has nausea from chemo and had mild ot moderate chest pain ( he blames it to low hemoglobin), he has shortness of breath and DOE,generalized weakness, some headache, but  denies fever, chills, headache, focal weakness, numbness tingling, speech difficulties.  ED Course: BP soft in 90s, on RA, saturating well. Saturating well on room air. Work-up shows creatinine slightly of 1.2 previously 1.4 and 1.0, LFTs normal, ferritin 346, folate 15.2 B12 1361, hb 6.0 gm platelet count of 3000 previously 90k on 7/27 and a week before that to 220K. 2019 -. TH PRBC was ordered and admission was requested.  04/04/2020 -status post 2 units of PRBC transfusion GI consulted, no prep overnight  Assessment & Plan:   Principal Problem:   Anemia associated with chemotherapy Active Problems:   Paroxysmal atrial fibrillation (HCC)   Coronary artery disease   Status post coronary artery bypass grafting x 7, 11/16/1996   Peripheral artery disease ([place])   Hyperlipidemia  associated with type 2 diabetes mellitus (HCC)   OSA and COPD overlap syndrome (HCC)   Anemia, iron deficiency   Type 2 diabetes mellitus with diabetic chronic kidney disease (HCC)   Chemotherapy induced neutropenia (HCC)   Bright red rectal bleeding   Anemia symptomatic from chemo, and rectal bleeding: -Hemodynamically stable -FOBT positive x2 -Holding home medication of Xarelto -Status post 2 units PRBC blood transfusion -Monitoring H&H every 6 hours -Hemoglobin 6.0, 6.4, >>   Blood on wiping with known hemorrhoids, chronic- watch , gi is consulted as hb was very low.  Holding Xarelto  -No active bleeding -GI Dr. Collene Mares following were anticipating endoscopy colonoscopy   Thrombocytopenia /history of small cell lung cancer -No active bleeding -Oncology following, recommended: Holding Xarelto until platelets is 50,000 or higher, PRBC transfusion, and transfuse platelets if is less than 20,000....and GI evaluation,   Paroxysmal atrial fibrillation  -was on xarelto Hold 2/2 anemnia and until antiplatelet are above 50,000 as per oncology.  -Hold metoprolol home dose of 100 mg , added  25 twice daily with holding parameters,  -resume his Multaq.  CAD status post coronary artery bypass grafting x 7, 11/16/1996/PVD:  -Reported mild  shortness of breath and chest pain on admission which has resolved now likely due to acute anemia, - EKG no acute ischemic finding has sinus rhythm right bundle branch, PAC.Hold anticoagulation due to hemoglobin being low.  HLD-continue his Lipitor.  OSA and COPD overlap syndrome -cpap bedtime, breo, inhalers prn, on RA  Type 2 diabetes mellitus with diabetic chronic kidney disease. Cont jardince, cont ssi Recent Labs       Lab Results  Component Value Date   HGBA1C 6.7 (H) 09/14/2019      Essential Hypertension-BP soft- pt endorses it is not new.Hold acei-hctz.Monitor.   Chemotherapy induced neutropenia/THROMBOCYTOPENIA: Monitor CBC  closely. Oncology following  Body mass index is 29.57 kg/m.    Consultants: Heme oncology/gastroenterology   ------------------------------------------------------------------------------------------------------------------------------------ DVT prophylaxis:  SCD/Compression stockings (withholding home medication of Xarelto) Code Status:   Code Status: Full Code Family Communication: No family member present at bedside-  Discussed with patient in detail--  expressed understanding and agreement of above. -Advance care planning has been discussed.   Admission status:    Status is: Inpatient  Remains inpatient appropriate because:Inpatient level of care appropriate due to severity of illness   Dispo: The patient is from: Home              Anticipated d/c is to: Home              Anticipated d/c date is: 2 days              Patient currently is not medically stable to d/c.        Procedures:   No admission procedures for hospital encounter.   Anticipating EGD colonoscopy  Antimicrobials:  Anti-infectives (From admission, onward)   None       Medication:  . atorvastatin  10 mg Oral QPM  . dronedarone  400 mg Oral BID WC  . empagliflozin  10 mg Oral Daily  . fluticasone furoate-vilanterol  1 puff Inhalation Daily  . gabapentin  600 mg Oral QHS  . insulin aspart  0-6 Units Subcutaneous Q6H  . metoprolol tartrate  12.5 mg Oral BID  . multivitamin with minerals  1 tablet Oral Daily  . niacin  1,000 mg Oral QHS  . pantoprazole (PROTONIX) IV  40 mg Intravenous Q12H  . sodium chloride flush  3 mL Intravenous Q12H  . traZODone  50 mg Oral QHS    acetaminophen **OR** acetaminophen, albuterol, nicotine polacrilex, nitroGLYCERIN, ondansetron **OR** ondansetron (ZOFRAN) IV, oxymetazoline, prochlorperazine, traMADol   Objective:   Vitals:   04/04/20 0927 04/04/20 0949 04/04/20 1010 04/04/20 1042  BP: (!) 97/55 112/62 112/64 (!) 116/55  Pulse: 100 (!) 110 85 88   Resp: 17  13 13   Temp:      TempSrc:      SpO2: 99%  96% 96%  Weight:      Height:        Intake/Output Summary (Last 24 hours) at 04/04/2020 1105 Last data filed at 04/04/2020 0203 Gross per 24 hour  Intake 1446.49 ml  Output --  Net 1446.49 ml   Filed Weights   04/03/20 1158  Weight: 98.9 kg     Examination:   Physical Exam  Constitution:  Alert, cooperative, no distress,  Appears calm and comfortable  Psychiatric: Normal and stable mood and affect, cognition intact,   HEENT: Normocephalic, PERRL, otherwise with in Normal limits  Chest:Chest symmetric Cardio vascular:  S1/S2, RRR, No murmure, No Rubs or Gallops  pulmonary: Clear to auscultation bilaterally, respirations  unlabored, negative wheezes / crackles Abdomen: Soft, non-tender, non-distended, bowel sounds,no masses, no organomegaly Muscular skeletal: Limited exam - in bed, able to move all 4 extremities, Normal strength,  Neuro: CNII-XII intact. , normal motor and sensation, reflexes intact  Extremities: No pitting edema lower extremities, +2 pulses  Skin: Dry, warm to touch, negative for any Rashes, No open wounds Wounds: per nursing documentation -none visible    ------------------------------------------------------------------------------------------------------------------------------------------    LABs:  CBC Latest Ref Rng & Units 04/04/2020 04/03/2020 04/03/2020  WBC 4.0 - 10.5 K/uL 10.6(H) - 10.8(H)  Hemoglobin 13.0 - 17.0 g/dL 7.2(L) 6.4(LL) 6.0(LL)  Hematocrit 39 - 52 % 22.2(L) 20.0(L) 18.9(L)  Platelets 150 - 400 K/uL 36(L) - 33(L)   CMP Latest Ref Rng & Units 04/04/2020 04/03/2020 03/27/2020  Glucose 70 - 99 mg/dL 136(H) 141(H) 154(H)  BUN 8 - 23 mg/dL 17 20 28(H)  Creatinine 0.61 - 1.24 mg/dL 1.06 1.29(H) 1.06  Sodium 135 - 145 mmol/L 138 137 138  Potassium 3.5 - 5.1 mmol/L 3.7 3.8 4.4  Chloride 98 - 111 mmol/L 99 101 102  CO2 22 - 32 mmol/L 29 24 28   Calcium 8.9 - 10.3 mg/dL 8.8(L) 9.8 9.3  Total  Protein 6.5 - 8.1 g/dL - 5.8(L) 5.6(L)  Total Bilirubin 0.3 - 1.2 mg/dL - 0.5 0.9  Alkaline Phos 38 - 126 U/L - 117 112  AST 15 - 41 U/L - 13(L) 15  ALT 0 - 44 U/L - 9 16       Micro Results Recent Results (from the past 240 hour(s))  SARS Coronavirus 2 by RT PCR (hospital order, performed in [facility] hospital lab) Nasopharyngeal Nasopharyngeal Swab     Status: None   Collection Time: 04/03/20 12:21 PM   Specimen: Nasopharyngeal Swab  Result Value Ref Range Status   SARS Coronavirus 2 NEGATIVE NEGATIVE Final    Comment: (NOTE) SARS-CoV-2 target nucleic acids are NOT DETECTED.  The SARS-CoV-2 RNA is generally detectable in upper and lower respiratory specimens during the acute phase of infection. The lowest concentration of SARS-CoV-2 viral copies this assay can detect is 250 copies / mL. A negative result does not preclude SARS-CoV-2 infection and should not be used as the sole basis for treatment or other patient management decisions.  A negative result may occur with improper specimen collection / handling, submission of specimen other than nasopharyngeal swab, presence of viral mutation(s) within the areas targeted by this assay, and inadequate number of viral copies (<250 copies / mL). A negative result must be combined with clinical observations, patient history, and epidemiological information.  Fact Sheet for Patients:   StrictlyIdeas.no  Fact Sheet for Healthcare Providers: BankingDealers.co.za  This test is not yet approved or  cleared by the [place] FDA and has been authorized for detection and/or diagnosis of SARS-CoV-2 by FDA under an Emergency Use Authorization (EUA).  This EUA will remain in effect (meaning this test can be used) for the duration of the COVID-19 declaration under Section 564(b)(1) of the Act, 21 U.S.C. section 360bbb-3(b)(1), unless the authorization is terminated or revoked  sooner.  Performed at [facility], [place] [address] [place], [place] [address]     Radiology Reports No results found.  SIGNED: Deatra James, MD, FACP, FHM. [facility],  Pager (please use [email or web address] to page/text)  If 7PM-7AM, please contact night-coverage [email or web address], Password [facility] 04/04/2020, 11:05 AM

## 2020-04-04 NOTE — Progress Notes (Addendum)
Subjective: [name] is a 74 year old white male with multiple medical problems including HTN, diabetes, CAD, atrial fibrillation, hyperlipidemia and small cell lung cancer on chemotherapy and radiation who presented to the emergency room with black stools and some small amount of bright red bleeding per rectum which he thought was coming from his from his hemorrhoids. He has had 5 blood transfusions in the past month. He said he has had the symptoms of black stools with weakness and dizziness for several weeks now with mild to moderate chest discomfort, shortness of breath on exertion and some headaches but denies any fever chills or rigors. He was seen by Dr. Carol Ada yesterday and orders were written for an EGD and a colonoscopy today but somehow did not get his prep and continued to eat a regular diet and therefore his procedures for today had to be  canceled. Currently denies having any abdominal pain, nausea or vomiting.b He had 1 large black BM earlier today. He claims he feels better since he was admitted to the hospital as he received significant amount of IV fluids and the dizziness and weakness has improved a little bit.  According to my office records his last colonoscopy was in 2011. As his father had colon cancer he was supposed to come back for repeat colonoscopy in 2016 but in spite of several reminders from our office he did not return to our office to schedule a surveillance colonoscopy.  Objective: Vital signs in last 24 hours: Temp:  [97.9 F (36.6 C)-98.7 F (37.1 C)] 98.7 F (37.1 C) (08/04 1121) Pulse Rate:  [72-110] 106 (08/04 1121) Resp:  [12-22] 15 (08/04 1121) BP: (92-140)/(39-76) 118/58 (08/04 1121) SpO2:  [95 %-100 %] 98 % (08/04 1121) Weight:  [98.9 kg] 98.9 kg (08/03 1158)    Intake/Output from previous day: 08/03 0701 - 08/04 0700 In: 1446.5 [I.V.:501.5; Blood:945] Out: -  Intake/Output this shift: No intake/output data recorded.  General  appearance: alert, cooperative, appears stated age, no distress, mildly obese and pale Resp: clear to auscultation bilaterally Cardio: regular rate and rhythm, S1, S2 normal, no murmur, click, rub or gallop GI: soft, non-tender; bowel sounds normal; no masses,  no organomegaly Extremities: extremities normal, atraumatic, no cyanosis or edema  Lab Results: Recent Labs    04/03/20 1111 04/03/20 1900 04/04/20 0737  WBC 10.8*  --  10.6*  HGB 6.0* 6.4* 7.2*  HCT 18.9* 20.0* 22.2*  PLT 33*  --  36*   BMET Recent Labs    04/03/20 1111 04/04/20 0737  NA 137 138  K 3.8 3.7  CL 101 99  CO2 24 29  GLUCOSE 141* 136*  BUN 20 17  CREATININE 1.29* 1.06  CALCIUM 9.8 8.8*   LFT Recent Labs    04/03/20 1111  PROT 5.8*  ALBUMIN 3.3*  AST 13*  ALT 9  ALKPHOS 117  BILITOT 0.5   Medications: I have reviewed the patient's current medications.  Assessment/Plan: 1) Posthemorrhagic iron deficiency anemia/melena/BRBPR/family history of colon cancer-an EGD and colonoscopy and plan have been planned for tomorrow orders have been written. Patient is on a clear liquid diet for now discussed his plan for the prep with his nurse Andee Poles in the ER.  His procedures were performed by Dr. Carol Ada further recommendation made by him. 2) History of coronary artery disease status post CABG grafting x7 in 11/16/1996/peripheral vascular disease/paroxysmal atrial fibrillation on Xarelto which has been held since admission. 3 ) Small cell lung cancer on the right  side on chemotherapy and radiation now with severe thrombocytopenia with platelets of 36,000. 4) OSA/COPD on CPAP at bedtime. 5) type 2 diabetes mellitus with diabetic kidney disease on Jardiance and sliding scale coverage with insulin. 6) Hypertension-his and the antihypertensives are on hold at the present time due to his hypotension and labile blood pressures.  Joanell Cressler,MD 04/04/2020, 11:22 AM

## 2020-04-04 NOTE — H&P (View-Only) (Signed)
Subjective: [name] is a 74 year old white male with multiple medical problems including HTN, diabetes, CAD, atrial fibrillation, hyperlipidemia and small cell lung cancer on chemotherapy and radiation who presented to the emergency room with black stools and some small amount of bright red bleeding per rectum which he thought was coming from his from his hemorrhoids. He has had 5 blood transfusions in the past month. He said he has had the symptoms of black stools with weakness and dizziness for several weeks now with mild to moderate chest discomfort, shortness of breath on exertion and some headaches but denies any fever chills or rigors. He was seen by Dr. Carol Ada yesterday and orders were written for an EGD and a colonoscopy today but somehow did not get his prep and continued to eat a regular diet and therefore his procedures for today had to be  canceled. Currently denies having any abdominal pain, nausea or vomiting.b He had 1 large black BM earlier today. He claims he feels better since he was admitted to the hospital as he received significant amount of IV fluids and the dizziness and weakness has improved a little bit.  According to my office records his last colonoscopy was in 2011. As his father had colon cancer he was supposed to come back for repeat colonoscopy in 2016 but in spite of several reminders from our office he did not return to our office to schedule a surveillance colonoscopy.  Objective: Vital signs in last 24 hours: Temp:  [97.9 F (36.6 C)-98.7 F (37.1 C)] 98.7 F (37.1 C) (08/04 1121) Pulse Rate:  [72-110] 106 (08/04 1121) Resp:  [12-22] 15 (08/04 1121) BP: (92-140)/(39-76) 118/58 (08/04 1121) SpO2:  [95 %-100 %] 98 % (08/04 1121) Weight:  [98.9 kg] 98.9 kg (08/03 1158)    Intake/Output from previous day: 08/03 0701 - 08/04 0700 In: 1446.5 [I.V.:501.5; Blood:945] Out: -  Intake/Output this shift: No intake/output data recorded.  General  appearance: alert, cooperative, appears stated age, no distress, mildly obese and pale Resp: clear to auscultation bilaterally Cardio: regular rate and rhythm, S1, S2 normal, no murmur, click, rub or gallop GI: soft, non-tender; bowel sounds normal; no masses,  no organomegaly Extremities: extremities normal, atraumatic, no cyanosis or edema  Lab Results: Recent Labs    04/03/20 1111 04/03/20 1900 04/04/20 0737  WBC 10.8*  --  10.6*  HGB 6.0* 6.4* 7.2*  HCT 18.9* 20.0* 22.2*  PLT 33*  --  36*   BMET Recent Labs    04/03/20 1111 04/04/20 0737  NA 137 138  K 3.8 3.7  CL 101 99  CO2 24 29  GLUCOSE 141* 136*  BUN 20 17  CREATININE 1.29* 1.06  CALCIUM 9.8 8.8*   LFT Recent Labs    04/03/20 1111  PROT 5.8*  ALBUMIN 3.3*  AST 13*  ALT 9  ALKPHOS 117  BILITOT 0.5   Medications: I have reviewed the patient's current medications.  Assessment/Plan: 1) Posthemorrhagic iron deficiency anemia/melena/BRBPR/family history of colon cancer-an EGD and colonoscopy and plan have been planned for tomorrow orders have been written. Patient is on a clear liquid diet for now discussed his plan for the prep with his nurse Andee Poles in the ER.  His procedures were performed by Dr. Carol Ada further recommendation made by him. 2) History of coronary artery disease status post CABG grafting x7 in 11/16/1996/peripheral vascular disease/paroxysmal atrial fibrillation on Xarelto which has been held since admission. 3 ) Small cell lung cancer on the right  side on chemotherapy and radiation now with severe thrombocytopenia with platelets of 36,000. 4) OSA/COPD on CPAP at bedtime. 5) type 2 diabetes mellitus with diabetic kidney disease on Jardiance and sliding scale coverage with insulin. 6) Hypertension-his and the antihypertensives are on hold at the present time due to his hypotension and labile blood pressures.  Zymire Turnbo,MD 04/04/2020, 11:22 AM

## 2020-04-05 ENCOUNTER — Encounter (HOSPITAL_COMMUNITY): Payer: Self-pay | Admitting: Internal Medicine

## 2020-04-05 ENCOUNTER — Telehealth: Payer: Self-pay | Admitting: Physician Assistant

## 2020-04-05 ENCOUNTER — Inpatient Hospital Stay (HOSPITAL_COMMUNITY): Payer: Medicare Other | Admitting: Certified Registered"

## 2020-04-05 ENCOUNTER — Encounter (HOSPITAL_COMMUNITY)
Admission: EM | Disposition: A | Discharge status: Home / Self Care | Payer: Self-pay | Source: Ambulatory Visit | Attending: Family Medicine

## 2020-04-05 HISTORY — PX: HOT HEMOSTASIS: SHX5433

## 2020-04-05 HISTORY — PX: POLYPECTOMY: SHX5525

## 2020-04-05 HISTORY — PX: COLONOSCOPY WITH PROPOFOL: SHX5780

## 2020-04-05 HISTORY — PX: ESOPHAGOGASTRODUODENOSCOPY (EGD) WITH PROPOFOL: SHX5813

## 2020-04-05 LAB — CBC
HCT: 26.4 % — ABNORMAL LOW (ref 39.0–52.0)
Hemoglobin: 8.5 g/dL — ABNORMAL LOW (ref 13.0–17.0)
MCH: 33.7 pg (ref 26.0–34.0)
MCHC: 32.2 g/dL (ref 30.0–36.0)
MCV: 104.8 fL — ABNORMAL HIGH (ref 80.0–100.0)
Platelets: 55 10*3/uL — ABNORMAL LOW (ref 150–400)
RBC: 2.52 MIL/uL — ABNORMAL LOW (ref 4.22–5.81)
RDW: 22.9 % — ABNORMAL HIGH (ref 11.5–15.5)
WBC: 11.7 10*3/uL — ABNORMAL HIGH (ref 4.0–10.5)
nRBC: 2.3 % — ABNORMAL HIGH (ref 0.0–0.2)

## 2020-04-05 LAB — BASIC METABOLIC PANEL
Anion gap: 13 (ref 5–15)
BUN: 12 mg/dL (ref 8–23)
CO2: 25 mmol/L (ref 22–32)
Calcium: 8.8 mg/dL — ABNORMAL LOW (ref 8.9–10.3)
Chloride: 100 mmol/L (ref 98–111)
Creatinine, Ser: 1.11 mg/dL (ref 0.61–1.24)
GFR calc Af Amer: >60 mL/min (ref >60)
GFR calc non Af Amer: >60 mL/min (ref >60)
Glucose, Bld: 128 mg/dL — ABNORMAL HIGH (ref 70–99)
Potassium: 3.5 mmol/L (ref 3.5–5.1)
Sodium: 138 mmol/L (ref 135–145)

## 2020-04-05 LAB — DIFFERENTIAL
Abs Immature Granulocytes: 0.58 10*3/uL — ABNORMAL HIGH (ref 0.00–0.07)
Basophils Absolute: 0.1 10*3/uL (ref 0.0–0.1)
Basophils Relative: 0 %
Eosinophils Absolute: 0 10*3/uL (ref 0.0–0.5)
Eosinophils Relative: 0 %
Immature Granulocytes: 5 %
Lymphocytes Relative: 12 %
Lymphs Abs: 1.4 10*3/uL (ref 0.7–4.0)
Monocytes Absolute: 1.5 10*3/uL — ABNORMAL HIGH (ref 0.1–1.0)
Monocytes Relative: 12 %
Neutro Abs: 8.4 10*3/uL — ABNORMAL HIGH (ref 1.7–7.7)
Neutrophils Relative %: 71 %

## 2020-04-05 LAB — GLUCOSE, CAPILLARY
Glucose-Capillary: 136 mg/dL — ABNORMAL HIGH (ref 70–99)
Glucose-Capillary: 115 mg/dL — ABNORMAL HIGH (ref 70–99)
Glucose-Capillary: 119 mg/dL — ABNORMAL HIGH (ref 70–99)
Glucose-Capillary: 107 mg/dL — ABNORMAL HIGH (ref 70–99)
Glucose-Capillary: 140 mg/dL — ABNORMAL HIGH (ref 70–99)

## 2020-04-05 SURGERY — COLONOSCOPY WITH PROPOFOL
Anesthesia: Monitor Anesthesia Care

## 2020-04-05 MED ORDER — LACTATED RINGERS IV SOLN: INTRAVENOUS | Status: DC | PRN

## 2020-04-05 MED ORDER — PHENYLEPHRINE HCL (PRESSORS) 10 MG/ML IV SOLN
INTRAVENOUS | Status: DC | PRN
  Administered 2020-04-05: 80 ug via INTRAVENOUS
  Administered 2020-04-05: 120 ug via INTRAVENOUS
  Administered 2020-04-05: 80 ug via INTRAVENOUS

## 2020-04-05 MED ORDER — PROPOFOL 500 MG/50ML IV EMUL
INTRAVENOUS | Status: DC | PRN
  Administered 2020-04-05: 150 ug/kg/min via INTRAVENOUS

## 2020-04-05 SURGICAL SUPPLY — 25 items

## 2020-04-05 NOTE — Anesthesia Preprocedure Evaluation (Addendum)
Anesthesia Evaluation  Patient identified by MRN, date of birth, ID band Patient awake    Reviewed: Allergy & Precautions, NPO status , Patient's Chart, lab work & pertinent test results, reviewed documented beta blocker date and time   Airway Mallampati: II  TM Distance: >3 FB Neck ROM: Full    Dental  (+) Teeth Intact, Dental Advisory Given,    Pulmonary sleep apnea and Continuous Positive Airway Pressure Ventilation , COPD,  COPD inhaler, former smoker,  Former smoker, quit 1996, 45 pack year history   Small cell lung ca currently on chemo/rad    Pulmonary exam normal breath sounds clear to auscultation       Cardiovascular hypertension, Pt. on home beta blockers and Pt. on medications + CAD, + Past MI, + CABG (CABG x 7 1998), + Peripheral Vascular Disease and +CHF (LVEF 69%, grade 2 diastolic dysfx)  Normal cardiovascular exam+ dysrhythmias (xarelto- LD 04/02/20) Atrial Fibrillation  Rhythm:Regular Rate:Normal  Last echo 07/2019: 1. Left ventricular ejection fraction, by visual estimation, is 40 to  45%. The left ventricle has mild to moderately decreased function. There  is no left ventricular hypertrophy.  2. Mid and apical anterior septum, apical lateral segment, apical  anterior segment, and apex are hypokinetic.  3. Definity contrast agent was given IV to delineate the left ventricular  endocardial borders.  4. Elevated left atrial pressure.  5. Left ventricular diastolic parameters are consistent with Grade II  diastolic dysfunction (pseudonormalization).  6. The left ventricle demonstrates regional wall motion abnormalities.  7. Global right ventricle has moderately reduced systolic function.The  right ventricular size is normal. No increase in right ventricular wall  thickness.  8. Left atrial size was mildly dilated.  9. Right atrial size was mildly dilated.  10. The mitral valve is normal in structure. No  evidence of mitral valve  regurgitation.  11. The tricuspid valve is normal in structure. Tricuspid valve  regurgitation mild-moderate.  12. The aortic valve is normal in structure. Aortic valve regurgitation is  not visualized.  13. The pulmonic valve was grossly normal. Pulmonic valve regurgitation is  not visualized.  14. Mildly elevated pulmonary artery systolic pressure.  15. The inferior vena cava is dilated in size with >50% respiratory  variability, suggesting right atrial pressure of 8 mmHg.  16. No intracardiac thrombi or masses were visualized.    Neuro/Psych negative neurological ROS  negative psych ROS   GI/Hepatic Neg liver ROS, GIB- anemia, heme positive stool    Endo/Other  diabetes, Well Controlled, Type 2, Oral Hypoglycemic AgentsLast a1c 6.7  Renal/GU Renal diseaseRenal artery stenosis, Cr 1.1  negative genitourinary   Musculoskeletal negative musculoskeletal ROS (+)   Abdominal   Peds  Hematology  (+) Blood dyscrasia, anemia , GIB- H/H 8.5/26.4, plt 55 from 36 yesterday  Thrombocytopenia 2/2 chemo-  Holding xarelto until plt >50, transfuse for <20 per onc   Anesthesia Other Findings   Reproductive/Obstetrics negative OB ROS                           Anesthesia Physical Anesthesia Plan  ASA: IV  Anesthesia Plan: MAC   Post-op Pain Management:    Induction:   PONV Risk Score and Plan: 2 and Propofol infusion and TIVA  Airway Management Planned: Natural Airway and Simple Face Mask  Additional Equipment: None  Intra-op Plan:   Post-operative Plan:   Informed Consent: I have reviewed the patients History and Physical, chart, labs  and discussed the procedure including the risks, benefits and alternatives for the proposed anesthesia with the patient or authorized representative who has indicated his/her understanding and acceptance.     Dental advisory given  Plan Discussed with: CRNA  Anesthesia Plan Comments:         Anesthesia Quick Evaluation

## 2020-04-05 NOTE — Transfer of Care (Signed)
Immediate Anesthesia Transfer of Care Note  Patient: [name]  Procedure(s) Performed: COLONOSCOPY WITH PROPOFOL (N/A ) ESOPHAGOGASTRODUODENOSCOPY (EGD) WITH PROPOFOL (N/A ) HOT HEMOSTASIS (ARGON PLASMA COAGULATION/BICAP) (N/A ) POLYPECTOMY  Patient Location: PACU  Anesthesia Type:MAC  Level of Consciousness: sedated, patient cooperative and responds to stimulation  Airway & Oxygen Therapy: Patient Spontanous Breathing and Patient connected to face mask oxygen  Post-op Assessment: Report given to RN and Post -op Vital signs reviewed and stable  Post vital signs: Reviewed and stable  Last Vitals:  Vitals Value Taken Time  BP    Temp    Pulse    Resp    SpO2      Last Pain:  Vitals:   04/05/20 1408  TempSrc: Oral  PainSc: 5          Complications: No complications documented.

## 2020-04-05 NOTE — Op Note (Signed)
[facility] Patient Name: [name] Procedure Date: 04/05/2020 MRN: [id number] Attending MD: Carol Ada , MD Date of Birth: Dec 06, 1945 CSN: [id number] Age: 74 Admit Type: Inpatient Procedure:                Colonoscopy Indications:              Heme positive stool, Iron deficiency anemia                            secondary to chronic blood loss Providers:                Carol Ada, MD, Carlyn Reichert, RN, Tyna Jaksch                            Technician Referring MD:              Medicines:                Propofol per Anesthesia Complications:            No immediate complications. Estimated Blood Loss:     Estimated blood loss: none. Procedure:                Pre-Anesthesia Assessment:                           - Prior to the procedure, a History and Physical                            was performed, and patient medications and                            allergies were reviewed. The patient's tolerance of                            previous anesthesia was also reviewed. The risks                            and benefits of the procedure and the sedation                            options and risks were discussed with the patient.                            All questions were answered, and informed consent                            was obtained. Prior Anticoagulants: The patient has                            taken no previous anticoagulant or antiplatelet                            agents. ASA Grade Assessment: III - A patient with                            severe  systemic disease. After reviewing the risks                            and benefits, the patient was deemed in                            satisfactory condition to undergo the procedure.                           - Sedation was administered by an anesthesia                            professional. Deep sedation was attained.                           After obtaining informed consent, the  colonoscope                            was passed under direct vision. Throughout the                            procedure, the patient's blood pressure, pulse, and                            oxygen saturations were monitored continuously. The                            PCF-H190DL ([id number] Olympus pediatric colonscope                            was introduced through the anus and advanced to the                            the cecum, identified by appendiceal orifice and                            ileocecal valve. The colonoscopy was technically                            difficult and complex due to poor bowel prep.                            Successful completion of the procedure was aided by                            lavage. The patient tolerated the procedure well.                            The quality of the bowel preparation was good. The                            ileocecal valve, appendiceal orifice, and rectum  were photographed. Scope In: 4:04:26 PM Scope Out: 4:23:40 PM Scope Withdrawal Time: 0 hours 13 minutes 23 seconds  Total Procedure Duration: 0 hours 19 minutes 14 seconds  Findings:      A 4 mm polyp was found in the transverse colon. The polyp was sessile.       The polyp was removed with a cold snare. Resection and retrieval were       complete.      Scattered small and large-mouthed diverticula were found in the sigmoid       colon and descending colon.      With the current findings, the colon does not appear to be the source of       his anemia and heme positivity. Impression:               - One 4 mm polyp in the transverse colon, removed                            with a cold snare. Resected and retrieved.                           - Diverticulosis in the sigmoid colon and in the                            descending colon. Moderate Sedation:      Not Applicable - Patient had care per Anesthesia. Recommendation:           - Return  patient to hospital ward for ongoing care.                           - Resume regular diet.                           - Continue present medications.                           - Await pathology results.                           - Monitor HGB and transfuse as necessary. Procedure Code(s):        --- Professional ---                           [phone], Colonoscopy, flexible; with removal of                            tumor(s), polyp(s), or other lesion(s) by snare                            technique Diagnosis Code(s):        --- Professional ---                           K63.5, Polyp of colon                           R19.5, Other fecal abnormalities  D50.0, Iron deficiency anemia secondary to blood                            loss (chronic)                           K57.30, Diverticulosis of large intestine without                            perforation or abscess without bleeding CPT copyright 2019 American Medical Association. All rights reserved. The codes documented in this report are preliminary and upon coder review may  be revised to meet current compliance requirements. Carol Ada, MD Carol Ada, MD 04/05/2020 4:39:28 PM This report has been signed electronically. Number of Addenda: 0

## 2020-04-05 NOTE — Anesthesia Postprocedure Evaluation (Signed)
Anesthesia Post Note  Patient: Akshay Spang Cirrincione  Procedure(s) Performed: COLONOSCOPY WITH PROPOFOL (N/A ) ESOPHAGOGASTRODUODENOSCOPY (EGD) WITH PROPOFOL (N/A ) HOT HEMOSTASIS (ARGON PLASMA COAGULATION/BICAP) (N/A ) POLYPECTOMY     Patient location during evaluation: PACU Anesthesia Type: MAC Level of consciousness: awake and alert Pain management: pain level controlled Vital Signs Assessment: post-procedure vital signs reviewed and stable Respiratory status: spontaneous breathing, nonlabored ventilation and respiratory function stable Cardiovascular status: stable and blood pressure returned to baseline Anesthetic complications: no   No complications documented.  Last Vitals:  Vitals:   04/05/20 1645 04/05/20 1650  BP: (!) 103/47 (!) 99/48  Pulse: 98 83  Resp: 20 20  Temp:    SpO2:  96%    Last Pain:  Vitals:   04/05/20 1650  TempSrc:   PainSc: 0-No pain                 Audry Pili

## 2020-04-05 NOTE — Progress Notes (Signed)
PROGRESS NOTE    Patient: [name]                            PCP: Luetta Nutting, DO                    DOB: 06/12/1946            DOA: 04/03/2020 TGG:[id number]             DOS: 04/05/2020, 1:34 PM   LOS: 2 days   Date of Service: The patient was seen and examined on 04/05/2020  Subjective:   The patient was seen and examined this morning, stable no acute distress N.p.o., status post GI prep overnight Anticipating EGD colonoscopy today Reporting of no further active bleeding Stating the last and third blood transfusion was completed around 4 AM this morning  Brief Narrative:  Per HPI: JAHN FRANCHINI is a 74 y.o. male w PMH/o DM,HTN, LUNG CANCER RULE limited stage on chemoradiation carboplatin/etoposide and anemia needing blood transfusion comes to the ED from cancer center due to low hemoglobin of 6.0 .   He reports he has had dark green tint stool but not black and has had noticed small amount of blood when he wipes and has known hemorrhoids.  Per patient status post 5 blood transfusion over 1 month.  Patient has nausea from chemo and had mild ot moderate chest pain ( he blames it to low hemoglobin), he has shortness of breath and DOE,generalized weakness, some headache, but  denies fever, chills, headache, focal weakness, numbness tingling, speech difficulties.  ED Course: BP soft in 90s, on RA, saturating well. Saturating well on room air. Work-up shows creatinine slightly of 1.2 previously 1.4 and 1.0, LFTs normal, ferritin 346, folate 15.2 B12 1361, hb 6.0 gm platelet count of 3000 previously 90k on 7/27 and a week before that to 220K. 2019 -. TH PRBC was ordered and admission was requested.  04/04/2020 -status post 2 units of PRBC transfusion GI consulted, no prep overnight  Assessment & Plan:   Principal Problem:   Anemia associated with chemotherapy Active Problems:   Paroxysmal atrial fibrillation (HCC)   Coronary artery disease   Status post coronary artery  bypass grafting x 7, 11/16/1996   Peripheral artery disease ([place])   Hyperlipidemia associated with type 2 diabetes mellitus (HCC)   OSA and COPD overlap syndrome (HCC)   Anemia, iron deficiency   Type 2 diabetes mellitus with diabetic chronic kidney disease (HCC)   Chemotherapy induced neutropenia (HCC)   Bright red rectal bleeding   Anemia symptomatic from chemo, and rectal bleeding: -Hemodynamically stable -FOBT positive x2 -Holding home medication of Xarelto -Status post total of 3 units PRBC blood transfusion 8/ 4 - 8 5/21 -Monitoring H&H every 6 hours -Hemoglobin 6.0, 6.4, >> 7.2, 8.5 now   - Holding Xarelto  -No active bleeding -N.p.o. overnight GI prep -GI Dr. Collene Mares following were anticipating endoscopy colonoscopy today 04/05/2020   Thrombocytopenia /history of small cell lung cancer -No active bleeding -Platelet 36 >> 55 today -Oncology following, recommended: Holding Xarelto until platelets is 50,000 or higher, PRBC transfusion, and transfuse platelets if is less than 20,000....and GI evaluation,   Paroxysmal atrial fibrillation  -was on xarelto Hold 2/2 anemnia and until antiplatelet are above 50,000 as per oncology.  -Hold metoprolol home dose of 100 mg , added  25 twice daily with holding parameters,  -resume his Multaq.  CAD status post coronary artery bypass grafting x 7, 11/16/1996/PVD:  -Reported mild shortness of breath and chest pain on admission which has resolved now likely due to acute anemia, - EKG no acute ischemic finding has sinus rhythm right bundle branch, PAC.Hold anticoagulation due to hemoglobin being low.  HLD-continue his Lipitor.  OSA and COPD overlap syndrome -cpap bedtime, breo, inhalers prn, on RA  Type 2 diabetes mellitus with diabetic chronic kidney disease. Cont jardince went for POA, cont ssi, checking CBG QA CHS Recent Labs       Lab Results  Component Value Date   HGBA1C 6.7 (H) 09/14/2019      Essential Hypertension-BP  soft- pt endorses it is not new.Hold acei-hctz.Monitor.   Chemotherapy induced neutropenia/THROMBOCYTOPENIA: Monitor CBC closely. Oncology following  Body mass index is 29.57 kg/m.    Consultants: Heme oncology/gastroenterology   ---------------------------------------------------------------------------------------------------------------------------- DVT prophylaxis:  SCD/Compression stockings (withholding home medication of Xarelto) Code Status:   Code Status: Full Code Family Communication: No family member present at bedside-  Discussed with patient in detail--  expressed understanding and agreement of above. -Advance care planning has been discussed.   Admission status:    Status is: Inpatient  Remains inpatient appropriate because:Inpatient level of care appropriate due to severity of illness   Dispo: The patient is from: Home              Anticipated d/c is to: Home              Anticipated d/c date is: 2 days              Patient currently is not medically stable to d/c.        Procedures:   No admission procedures for hospital encounter.   Anticipating EGD colonoscopy  Antimicrobials:  Anti-infectives (From admission, onward)   None       Medication:  . atorvastatin  10 mg Oral QPM  . dronedarone  400 mg Oral BID WC  . empagliflozin  10 mg Oral Daily  . fluticasone furoate-vilanterol  1 puff Inhalation Daily  . gabapentin  600 mg Oral QHS  . insulin aspart  0-6 Units Subcutaneous Q6H  . metoprolol tartrate  12.5 mg Oral BID  . multivitamin with minerals  1 tablet Oral Daily  . niacin  1,000 mg Oral QHS  . pantoprazole (PROTONIX) IV  40 mg Intravenous Q12H  . sodium chloride flush  3 mL Intravenous Q12H  . traZODone  50 mg Oral QHS    acetaminophen **OR** acetaminophen, albuterol, nicotine polacrilex, nitroGLYCERIN, ondansetron **OR** ondansetron (ZOFRAN) IV, oxymetazoline, prochlorperazine, traMADol   Objective:   Vitals:   04/05/20  0404 04/05/20 0522 04/05/20 0859 04/05/20 1243  BP: 117/64 (!) 112/57  119/64  Pulse: 88 84  97  Resp:  19    Temp: 98.9 F (37.2 C) 98.7 F (37.1 C)  98.8 F (37.1 C)  TempSrc: Oral Oral  Oral  SpO2:  90% 93% 98%  Weight:      Height:        Intake/Output Summary (Last 24 hours) at 04/05/2020 1334 Last data filed at 04/05/2020 1100 Gross per 24 hour  Intake 159.48 ml  Output 1 ml  Net 158.48 ml   Filed Weights   04/03/20 1158 04/04/20 1819  Weight: 98.9 kg 96.7 kg     Examination:     Physical Exam:   General:  Alert, oriented, cooperative, no distress;   HEENT:  Normocephalic, PERRL, otherwise with in Normal  limits   Neuro:  CNII-XII intact. , normal motor and sensation, reflexes intact   Lungs:   Clear to auscultation BL, Respirations unlabored, no wheezes / crackles  Cardio:    S1/S2, RRR, No murmure, No Rubs or Gallops   Abdomen:   Soft, non-tender, bowel sounds active all four quadrants,  no guarding or peritoneal signs.  Muscular skeletal:  Limited exam - in bed, able to move all 4 extremities, Normal strength,  2+ pulses,  symmetric, No pitting edema  Skin:  Dry, warm to touch, negative for any Rashes, No open wounds  Wounds: Please see nursing documentation         ------------------------------------------------------------------------------------------------------------------------------------------    LABs:  CBC Latest Ref Rng & Units 04/05/2020 04/04/2020 04/03/2020  WBC 4.0 - 10.5 K/uL 11.7(H) 10.6(H) -  Hemoglobin 13.0 - 17.0 g/dL 8.5(L) 7.2(L) 6.4(LL)  Hematocrit 39 - 52 % 26.4(L) 22.2(L) 20.0(L)  Platelets 150 - 400 K/uL 55(L) 36(L) -   CMP Latest Ref Rng & Units 04/05/2020 04/04/2020 04/03/2020  Glucose 70 - 99 mg/dL 128(H) 136(H) 141(H)  BUN 8 - 23 mg/dL 12 17 20   Creatinine 0.61 - 1.24 mg/dL 1.11 1.06 1.29(H)  Sodium 135 - 145 mmol/L 138 138 137  Potassium 3.5 - 5.1 mmol/L 3.5 3.7 3.8  Chloride 98 - 111 mmol/L 100 99 101  CO2 22 - 32 mmol/L 25 29  24   Calcium 8.9 - 10.3 mg/dL 8.8(L) 8.8(L) 9.8  Total Protein 6.5 - 8.1 g/dL - - 5.8(L)  Total Bilirubin 0.3 - 1.2 mg/dL - - 0.5  Alkaline Phos 38 - 126 U/L - - 117  AST 15 - 41 U/L - - 13(L)  ALT 0 - 44 U/L - - 9       Micro Results Recent Results (from the past 240 hour(s))  SARS Coronavirus 2 by RT PCR (hospital order, performed in [facility] hospital lab) Nasopharyngeal Nasopharyngeal Swab     Status: None   Collection Time: 04/03/20 12:21 PM   Specimen: Nasopharyngeal Swab  Result Value Ref Range Status   SARS Coronavirus 2 NEGATIVE NEGATIVE Final    Comment: (NOTE) SARS-CoV-2 target nucleic acids are NOT DETECTED.  The SARS-CoV-2 RNA is generally detectable in upper and lower respiratory specimens during the acute phase of infection. The lowest concentration of SARS-CoV-2 viral copies this assay can detect is 250 copies / mL. A negative result does not preclude SARS-CoV-2 infection and should not be used as the sole basis for treatment or other patient management decisions.  A negative result may occur with improper specimen collection / handling, submission of specimen other than nasopharyngeal swab, presence of viral mutation(s) within the areas targeted by this assay, and inadequate number of viral copies (<250 copies / mL). A negative result must be combined with clinical observations, patient history, and epidemiological information.  Fact Sheet for Patients:   StrictlyIdeas.no  Fact Sheet for Healthcare Providers: BankingDealers.co.za  This test is not yet approved or  cleared by the [place] FDA and has been authorized for detection and/or diagnosis of SARS-CoV-2 by FDA under an Emergency Use Authorization (EUA).  This EUA will remain in effect (meaning this test can be used) for the duration of the COVID-19 declaration under Section 564(b)(1) of the Act, 21 U.S.C. section 360bbb-3(b)(1), unless the  authorization is terminated or revoked sooner.  Performed at [facility], [place] [address] [place], [place] [address]     Radiology Reports No results found.  SIGNED: Erling Conte  Lynann Bologna, MD, FACP, FHM. [facility],  Pager (please use [email or web address] to page/text)  If 7PM-7AM, please contact night-coverage [email or web address] [facility] 04/05/2020, 1:33 PM

## 2020-04-05 NOTE — Interval H&P Note (Signed)
History and Physical Interval Note:  04/05/2020 3:32 PM  [name]  has presented today for surgery, with the diagnosis of Anemia and heme positive stool.  The various methods of treatment have been discussed with the patient and family. After consideration of risks, benefits and other options for treatment, the patient has consented to  Procedure(s): COLONOSCOPY WITH PROPOFOL (N/A) ESOPHAGOGASTRODUODENOSCOPY (EGD) WITH PROPOFOL (N/A) as a surgical intervention.  The patient's history has been reviewed, patient examined, no change in status, stable for surgery.  I have reviewed the patient's chart and labs.  Questions were answered to the patient's satisfaction.     Katrice Goel D

## 2020-04-05 NOTE — Telephone Encounter (Signed)
R/s 8/18 to 8/23 per sch msg. Called and confirmed with patient.

## 2020-04-05 NOTE — Progress Notes (Signed)
Pt states he is unable to tolerate CPAP mask tonight, pt having periods of desaturation while sleeping.  Pt placed on 3 LPM [place].  RT to monitor and assess as needed.

## 2020-04-05 NOTE — Op Note (Signed)
[facility] Patient Name: [name] Procedure Date: 04/05/2020 MRN: [id number] Attending MD: Carol Ada , MD Date of Birth: Oct 08, 1945 CSN: [id number] Age: 74 Admit Type: Inpatient Procedure:                Upper GI endoscopy Indications:              Iron deficiency anemia secondary to chronic blood                            loss, Heme positive stool Providers:                Carol Ada, MD, Carlyn Reichert, RN, Tyna Jaksch                            Technician Referring MD:              Medicines:                Propofol per Anesthesia Complications:            No immediate complications. Estimated Blood Loss:     Estimated blood loss: none. Procedure:                Pre-Anesthesia Assessment:                           - Prior to the procedure, a History and Physical                            was performed, and patient medications and                            allergies were reviewed. The patient's tolerance of                            previous anesthesia was also reviewed. The risks                            and benefits of the procedure and the sedation                            options and risks were discussed with the patient.                            All questions were answered, and informed consent                            was obtained. Prior Anticoagulants: The patient has                            taken no previous anticoagulant or antiplatelet                            agents. ASA Grade Assessment: III - A patient with  severe systemic disease. After reviewing the risks                            and benefits, the patient was deemed in                            satisfactory condition to undergo the procedure.                           - Sedation was administered by an anesthesia                            professional. Deep sedation was attained.                           After obtaining informed consent, the  endoscope was                            passed under direct vision. Throughout the                            procedure, the patient's blood pressure, pulse, and                            oxygen saturations were monitored continuously. The                            PCF-H190DL ([id number] Olympus pediatric colonscope                            was introduced through the mouth, and advanced to                            the third part of duodenum. The upper GI endoscopy                            was accomplished without difficulty. The patient                            tolerated the procedure well. Scope In: Scope Out: Findings:      The esophagus was normal.      Localized moderately erythematous mucosa without bleeding was found in       the gastric fundus. Coagulation for tissue destruction using monopolar       probe was successful.      The examined duodenum was normal.      The erythematous patch noted in the fundus/proximal gastric body may       represent an AVM versus a nonspecific erythema. This area was treated       with APC. It is unknown if this was the site of bleeding contributing to       his anemia. Impression:               - Normal esophagus.                           -  Erythematous mucosa in the gastric fundus.                            Treated with a monopolar probe.                           - Normal examined duodenum.                           - No specimens collected. Moderate Sedation:      Not Applicable - Patient had care per Anesthesia. Recommendation:           - Proceed with the colonoscopy. Procedure Code(s):        --- Professional ---                           [phone], Esophagogastroduodenoscopy, flexible,                            transoral; with ablation of tumor(s), polyp(s), or                            other lesion(s) (includes pre- and post-dilation                            and guide wire passage, when performed) Diagnosis Code(s):         --- Professional ---                           K31.89, Other diseases of stomach and duodenum                           D50.0, Iron deficiency anemia secondary to blood                            loss (chronic)                           R19.5, Other fecal abnormalities CPT copyright 2019 American Medical Association. All rights reserved. The codes documented in this report are preliminary and upon coder review may  be revised to meet current compliance requirements. Carol Ada, MD Carol Ada, MD 04/05/2020 4:43:34 PM This report has been signed electronically. Number of Addenda: 0

## 2020-04-06 ENCOUNTER — Encounter (HOSPITAL_COMMUNITY): Payer: Self-pay | Admitting: Gastroenterology

## 2020-04-06 LAB — CBC
HCT: 25.1 % — ABNORMAL LOW (ref 39.0–52.0)
Hemoglobin: 8.1 g/dL — ABNORMAL LOW (ref 13.0–17.0)
MCH: 34.2 pg — ABNORMAL HIGH (ref 26.0–34.0)
MCHC: 32.3 g/dL (ref 30.0–36.0)
MCV: 105.9 fL — ABNORMAL HIGH (ref 80.0–100.0)
Platelets: 68 10*3/uL — ABNORMAL LOW (ref 150–400)
RBC: 2.37 MIL/uL — ABNORMAL LOW (ref 4.22–5.81)
RDW: 23.5 % — ABNORMAL HIGH (ref 11.5–15.5)
WBC: 13.8 10*3/uL — ABNORMAL HIGH (ref 4.0–10.5)
nRBC: 1.4 % — ABNORMAL HIGH (ref 0.0–0.2)

## 2020-04-06 LAB — BASIC METABOLIC PANEL
Anion gap: 10 (ref 5–15)
BUN: 13 mg/dL (ref 8–23)
CO2: 27 mmol/L (ref 22–32)
Calcium: 8.8 mg/dL — ABNORMAL LOW (ref 8.9–10.3)
Chloride: 101 mmol/L (ref 98–111)
Creatinine, Ser: 1.02 mg/dL (ref 0.61–1.24)
GFR calc Af Amer: >60 mL/min (ref >60)
GFR calc non Af Amer: >60 mL/min (ref >60)
Glucose, Bld: 132 mg/dL — ABNORMAL HIGH (ref 70–99)
Potassium: 3.5 mmol/L (ref 3.5–5.1)
Sodium: 138 mmol/L (ref 135–145)

## 2020-04-06 LAB — TYPE AND SCREEN
ABO/RH(D): AB POS
Antibody Screen: NEGATIVE
Unit division: 0
Unit division: 0
Unit division: 0

## 2020-04-06 LAB — BPAM RBC
Blood Product Expiration Date: 202108212359
Blood Product Expiration Date: 202108222359
Blood Product Expiration Date: 202108232359
ISSUE DATE / TIME: 202108031442
ISSUE DATE / TIME: 202108031852
ISSUE DATE / TIME: 202108050019
Unit Type and Rh: 6200
Unit Type and Rh: 6200
Unit Type and Rh: 6200

## 2020-04-06 LAB — GLUCOSE, CAPILLARY
Glucose-Capillary: 117 mg/dL — ABNORMAL HIGH (ref 70–99)
Glucose-Capillary: 135 mg/dL — ABNORMAL HIGH (ref 70–99)

## 2020-04-06 MED ORDER — PANTOPRAZOLE SODIUM 40 MG PO TBEC
40.0000 mg | DELAYED_RELEASE_TABLET | Freq: Every day | ORAL | 2 refills | Status: DC
Start: 2020-04-06 — End: 2021-05-23

## 2020-04-06 NOTE — Progress Notes (Signed)
Patient discharged home.  IV removed - WNL.  Reviewed AVS and medications, instructed to follow up with oncology on Monday and have CBC repeated.  Patient verbalizes understanding - no questions at this time.  Assisted off unit via WC in NAD.

## 2020-04-06 NOTE — Discharge Summary (Signed)
Physician Discharge Summary [facility]    Patient: [name]                   Admit date: 04/03/2020   DOB: 10-13-1945             Discharge date:04/06/2020/10:15 AM VHQ:[id number]                          PCP: Richard Nutting, DO  Disposition: Home   Recommendations for Outpatient Follow-up:   . Follow up: in 3 days which are heme oncologist, repeat labs, CBC on Monday, 04/09/2020  Discharge Condition: Stable   Code Status:   Code Status: Full Code  Diet recommendation: Regular healthy diet   Discharge Diagnoses:    Principal Problem:   Anemia associated with chemotherapy Active Problems:   Paroxysmal atrial fibrillation ([name])   Coronary artery disease   Status post coronary artery bypass grafting x 7, 11/16/1996   Peripheral artery disease ([name])   Hyperlipidemia associated with type 2 diabetes mellitus (HCC)   OSA and COPD overlap syndrome (HCC)   Anemia, iron deficiency   Type 2 diabetes mellitus with diabetic chronic kidney disease ([name])   Chemotherapy induced neutropenia ([name])   Bright red rectal bleeding   History of Present Illness/ Hospital Course [name] Summary:    [name] a 73 y.o.malew PMH/oDM,HTN, LUNG CANCER RULE limited stage on chemoradiation carboplatin/etoposide and anemia needing blood transfusioncomes to the ED from cancer center due to low hemoglobin of 6.0 .  He reports he has had dark green tint stool but not black and has had noticed small amount of blood when he wipes and has known hemorrhoids.  Per patient status post 5 blood transfusion over 1 month.  Patienthasnausea from chemo and had mild ot moderate chest pain ( he blames it to low hemoglobin), he has shortness of breath and DOE,generalized weakness, some headache, but deniesfever, chills, headache, focal weakness, numbness tingling, speech difficulties.  ED Course:BP soft in 90s, on RA, saturating well.Saturating well on room air. Work-up shows  creatinine slightly of 1.2 previously 1.4 and 1.0, LFTs normal, ferritin 346, folate 15.2 B12 1361, hb 6.0 gmplatelet count of 3000 previously 90kon 7/27 and a week before that to 220K.2019 -. TH PRBC was ordered and admission was requested.  04/04/2020 -status post 3 units of PRBC transfusion GI consulted, no prep overnight  04/05/2020-patient status post GI prep, status post EGD and colonoscopy, EGD negative, colonoscopy polyps were removed, no active signs of bleeding.  H&H remained stable  04/06/2020 H&H remained stable-patient requested to be discharged home, he will follow with his oncologist on Monday  Detailed discharge summary/A&P    Anemia symptomatic from chemo, and rectal bleeding: -Hemodynamically stable -FOBT positive x2 -Holding home medication of Xarelto -Status post total of 3 units PRBC blood transfusion 8/ 4 - 8 5/21 -Monitoring H&H every 6 hours -Hemoglobin 6.0, 6.4, >> 7.2, 8.5  >>>8.1 now  - Xarelto -platelet count remains elevated, no active signs of bleeding, may resume Xarelto in a.m.  -GI Dr. Collene Mares following-status post endoscopy colonoscopy today 04/05/2020 EGD within normal limits, colonoscopy revealed polyps biopsies were obtained, no active signs of bleeding   Thrombocytopenia /history of small cell lung cancer -No active bleeding -Platelet 36 >> 55 >>65 today -Oncology following, recommended: Was holding Xarelto until platelets is 50,000 or higher, PRBC transfusion, and transfuse platelets if is less than 20,000....and GI evaluation, Today platelets greater  than 65,000,-initiating Xarelto in a.m.   Paroxysmal atrial fibrillation -was on xarelto will be resumed in a.m. -Will be resumed metoprolol home dose of 100mg   -resume his Multaq.  CAD status post coronary artery bypass grafting x 7, 11/16/1996/PVD: -Reported mild shortness of breath and chest pain on admission which has resolved now likely due to acute anemia, - EKG no acute  ischemic finding has sinus rhythm right bundle branch, PAC.Hold anticoagulation due to hemoglobin being low.  HLD-continue his Lipitor.  OSA and COPD overlap syndrome-cpap bedtime, breo, inhalers prn, on RA  Type 2 diabetes mellitus with diabetic chronic kidney disease. Cont jardince went for POA, cont ssi, checking CBG QA CHS Recent Labs       Lab Results  Component Value Date   HGBA1C 6.7 (H) 09/14/2019      Essential Hypertension-BP soft- pt endorses it is not new.Hold acei-hctz.Monitor.   Chemotherapy induced neutropenia/THROMBOCYTOPENIA:Monitor CBC closely. Oncology following  Body mass index is 29.57 kg/m.   Consultants: Heme oncology/gastroenterology      Discharge Instructions:   Discharge Instructions    Activity as tolerated - No restrictions   Complete by: As directed    Activity as tolerated - No restrictions   Complete by: As directed    Call MD for:  persistant dizziness or light-headedness   Complete by: As directed    Diet - low sodium heart healthy   Complete by: As directed    Discharge instructions   Complete by: As directed    Discharge instructions   Complete by: As directed    Continue current medication, Ultram was DC'd due to GI bleed, Protonix was started May resume your Xarelto tomorrow. Monitor your symptoms for his dizziness weakness, monitor your stool for blood Recommending to repeat your labs including CBC, monitoring hemoglobin today 8.1.   Increase activity slowly   Complete by: As directed    Increase activity slowly   Complete by: As directed        Medication List    STOP taking these medications   traMADol 50 MG tablet Commonly known as: ULTRAM     TAKE these medications   acetaminophen 500 MG tablet Commonly known as: TYLENOL Take 1,000 mg every 6 (six) hours as needed by mouth (for pain.).   atorvastatin 10 MG tablet Commonly known as: LIPITOR TAKE 1 TABLET(10 MG) BY MOUTH DAILY What  changed:   how much to take  how to take this  when to take this   budesonide-formoterol 160-4.5 MCG/ACT inhaler Commonly known as: Symbicort Inhale 2 puffs into the lungs 2 (two) times daily.   cetirizine 10 MG tablet Commonly known as: ZYRTEC Take 10 mg by mouth daily as needed for allergies.   furosemide 40 MG tablet Commonly known as: LASIX TAKE 1 TABLET BY MOUTH AS NEEDED What changed:   when to take this  reasons to take this   gabapentin 300 MG capsule Commonly known as: NEURONTIN Take 2 capsules (600 mg total) by mouth at bedtime.   Jardiance 10 MG Tabs tablet Generic drug: empagliflozin Take 10 mg by mouth daily. Schedule appt with PCP   lisinopril-hydrochlorothiazide 10-12.5 MG tablet Commonly known as: ZESTORETIC TAKE 1 TABLET BY MOUTH DAILY What changed: when to take this   metoprolol succinate 100 MG 24 hr tablet Commonly known as: TOPROL-XL TAKE 1 TABLET(100 MG) BY MOUTH DAILY What changed: See the new instructions.   Multaq 400 MG tablet Generic drug: dronedarone TAKE 1 TABLET BY MOUTH TWICE  DAILY WITH MEAL What changed:   how much to take  how to take this  when to take this   multivitamin with minerals Tabs tablet Take 1 tablet by mouth daily. In the morning.   niacin 1000 MG CR tablet Commonly known as: NIASPAN TAKE 1 TABLET(1000 MG) BY MOUTH AT BEDTIME What changed:   how much to take  how to take this  when to take this  additional instructions   nicotine polacrilex 2 MG lozenge Commonly known as: COMMIT Take 2 mg by mouth as needed for smoking cessation.   nitroGLYCERIN 0.4 MG SL tablet Commonly known as: NITROSTAT Place 1 tablet (0.4 mg total) under the tongue every 5 (five) minutes as needed.   NON FORMULARY CPAP at night   oxymetazoline 0.05 % nasal spray Commonly known as: AFRIN Place 1 spray into both nostrils 2 (two) times daily as needed for congestion.   pantoprazole 40 MG tablet Commonly known as:  Protonix Take 1 tablet (40 mg total) by mouth daily.   prochlorperazine 10 MG tablet Commonly known as: COMPAZINE TAKE 1 TABLET(10 MG) BY MOUTH EVERY 6 HOURS AS NEEDED FOR NAUSEA OR VOMITING What changed: See the new instructions.   traZODone 50 MG tablet Commonly known as: DESYREL TAKE 1/2 TO 1 TABLET BY MOUTH AT BEDTIME AS NEEDED FOR SLEEP What changed:   how much to take  when to take this  additional instructions   Ventolin HFA 108 (90 Base) MCG/ACT inhaler Generic drug: albuterol INHALE 2 PUFFS INTO THE LUNGS EVERY 6 HOURS AS NEEDED FOR WHEEZING OR SHORTNESS OF BREATH What changed: See the new instructions.   Xarelto 20 MG Tabs tablet Generic drug: rivaroxaban TAKE 1 TABLET BY MOUTH EVERY DAY WITH SUPPER What changed: See the new instructions.   Zaditor 0.025 % ophthalmic solution Generic drug: ketotifen Place 1 drop into both eyes 2 (two) times daily as needed (allergies).       No Known Allergies   Procedures /Studies:    No results found.  Subjective:   Patient was seen and examined 04/06/2020, 10:15 AM Patient stable today. No acute distress.  No issues overnight Stable for discharge.  Discharge Exam:    Vitals:   04/05/20 1730 04/05/20 2014 04/05/20 2302 04/06/20 0549  BP:  (!) 114/59  112/65  Pulse: 100 (!) 102  97  Resp: 20 19  18   Temp: 98.6 F (37 C) 100.1 F (37.8 C)  99.7 F (37.6 C)  TempSrc: Oral Oral  Oral  SpO2: 97% 90% 90% 95%  Weight:      Height:        General: Pt lying comfortably in bed & appears in no obvious distress. Cardiovascular: S1 & S2 heard, RRR, S1/S2 +. No murmurs, rubs, gallops or clicks. No JVD or pedal edema. Respiratory: Clear to auscultation without wheezing, rhonchi or crackles. No increased work of breathing. Abdominal:  Non-distended, non-tender & soft. No organomegaly or masses appreciated. Normal bowel sounds heard. CNS: Alert and oriented. No focal deficits. Extremities: no edema, no  cyanosis    The results of significant diagnostics from this hospitalization (including imaging, microbiology, ancillary and laboratory) are listed below for reference.      Microbiology:   Recent Results (from the past 240 hour(s))  SARS Coronavirus 2 by RT PCR (hospital order, performed in [facility] hospital lab) Nasopharyngeal Nasopharyngeal Swab     Status: None   Collection Time: 04/03/20 12:21 PM   Specimen: Nasopharyngeal Swab  Result  Value Ref Range Status   SARS Coronavirus 2 NEGATIVE NEGATIVE Final    Comment: (NOTE) SARS-CoV-2 target nucleic acids are NOT DETECTED.  The SARS-CoV-2 RNA is generally detectable in upper and lower respiratory specimens during the acute phase of infection. The lowest concentration of SARS-CoV-2 viral copies this assay can detect is 250 copies / mL. A negative result does not preclude SARS-CoV-2 infection and should not be used as the sole basis for treatment or other patient management decisions.  A negative result may occur with improper specimen collection / handling, submission of specimen other than nasopharyngeal swab, presence of viral mutation(s) within the areas targeted by this assay, and inadequate number of viral copies (<250 copies / mL). A negative result must be combined with clinical observations, patient history, and epidemiological information.  Fact Sheet for Patients:   StrictlyIdeas.no  Fact Sheet for Healthcare Providers: BankingDealers.co.za  This test is not yet approved or  cleared by the [place] FDA and has been authorized for detection and/or diagnosis of SARS-CoV-2 by FDA under an Emergency Use Authorization (EUA).  This EUA will remain in effect (meaning this test can be used) for the duration of the COVID-19 declaration under Section 564(b)(1) of the Act, 21 U.S.C. section 360bbb-3(b)(1), unless the authorization is terminated or revoked  sooner.  Performed at [facility], [place] [address] [place], [place] [address]      Labs:   CBC: Recent Labs  Lab 04/03/20 1111 04/03/20 1900 04/04/20 0737 04/05/20 0758 04/06/20 0431  WBC 10.8*  --  10.6* 11.7* 13.8*  NEUTROABS 6.9  --   --  8.4*  --   HGB 6.0* 6.4* 7.2* 8.5* 8.1*  HCT 18.9* 20.0* 22.2* 26.4* 25.1*  MCV 107.4*  --  102.3* 104.8* 105.9*  PLT 33*  --  36* 55* 68*   Basic Metabolic Panel: Recent Labs  Lab 04/03/20 1111 04/04/20 0737 04/05/20 0758 04/06/20 0431  NA 137 138 138 138  K 3.8 3.7 3.5 3.5  CL 101 99 100 101  CO2 24 29 25 27   GLUCOSE 141* 136* 128* 132*  BUN 20 17 12 13   CREATININE 1.29* 1.06 1.11 1.02  CALCIUM 9.8 8.8* 8.8* 8.8*   Liver Function Tests: Recent Labs  Lab 04/03/20 1111  AST 13*  ALT 9  ALKPHOS 117  BILITOT 0.5  PROT 5.8*  ALBUMIN 3.3*   BNP (last 3 results) No results for input(s): BNP in the last 8760 hours. Cardiac Enzymes: No results for input(s): CKTOTAL, CKMB, CKMBINDEX, TROPONINI in the last 168 hours. CBG: Recent Labs  Lab 04/05/20 1128 04/05/20 1733 04/05/20 2011 04/06/20 0045 04/06/20 0546  GLUCAP 119* 107* 140* 117* 135*   Hgb A1c No results for input(s): HGBA1C in the last 72 hours. Lipid Profile No results for input(s): CHOL, HDL, LDLCALC, TRIG, CHOLHDL, LDLDIRECT in the last 72 hours. Thyroid function studies No results for input(s): TSH, T4TOTAL, T3FREE, THYROIDAB in the last 72 hours.  Invalid input(s): FREET3 Anemia work up Recent Labs    04/03/20 1222 04/03/20 1225  VITAMINB12 1,361*  --   FOLATE 15.2  --   FERRITIN 346*  --   TIBC 339  --   IRON 182  --   RETICCTPCT  --  5.6*   Urinalysis    Component Value Date/Time   COLORURINE YELLOW 01/25/2008 [place] 01/25/2008 1727   LABSPEC 1.018 01/25/2008 1727   PHURINE 6.5 01/25/2008 1727   GLUCOSEU NEGATIVE 01/25/2008 1727  HGBUR SMALL (A) 01/25/2008 1727   BILIRUBINUR NEGATIVE  01/25/2008 1727   KETONESUR NEGATIVE 01/25/2008 1727   PROTEINUR NEGATIVE 01/25/2008 1727   UROBILINOGEN 1.0 01/25/2008 1727   NITRITE NEGATIVE 01/25/2008 1727   LEUKOCYTESUR NEGATIVE 01/25/2008 1727         Time coordinating discharge: Over 55 minutes  SIGNED: Deatra James, MD, FACP, FHM. [facility],  Please use [email or web address] to Page If 7PM-7AM, please contact night-coverage [email or web address], Password [facility] 04/06/2020, 10:15 AM

## 2020-04-06 NOTE — Progress Notes (Signed)
  Radiation Oncology         [phone] ________________________________  Name: [name] MRN: [id number]  Date: 02/01/2020  DOB: 1946-07-11  End of Treatment Note  Diagnosis:   limited stage small cell lung cancer  Indication for treatment::  curative       Radiation treatment dates:   01/23/20 - 02/01/20  Site/dose:   The patient was treated to the right lung with a course of stereotactic body radiation treatment.  The patient received 54 Gray in 3 fractions using a SBRT/VMAT technique, with 3 fields.  Narrative: The patient tolerated radiation treatment relatively well.   No unexpected difficulties.  The patient's breathing did not significantly change during the course of the treatment.  Plan: The patient has completed radiation treatment. The patient will return to radiation oncology clinic for routine followup in one month. I advised the patient to call or return sooner if they have any questions or concerns related to their recovery or treatment. ________________________________  Jodelle Gross, M.D., Ph.D.

## 2020-04-09 ENCOUNTER — Other Ambulatory Visit: Payer: Self-pay | Admitting: Medical Oncology

## 2020-04-09 ENCOUNTER — Telehealth: Payer: Self-pay | Admitting: Internal Medicine

## 2020-04-09 DIAGNOSIS — C9000 Multiple myeloma not having achieved remission: Secondary | ICD-10-CM

## 2020-04-09 LAB — SURGICAL PATHOLOGY

## 2020-04-09 NOTE — Telephone Encounter (Signed)
Scheduled per sch msg. Called and spoke with patient. Confirmed appt  

## 2020-04-10 ENCOUNTER — Ambulatory Visit: Payer: Medicare Other | Admitting: Cardiovascular Disease

## 2020-04-10 ENCOUNTER — Other Ambulatory Visit: Payer: Self-pay

## 2020-04-10 ENCOUNTER — Inpatient Hospital Stay: Payer: Medicare Other

## 2020-04-10 VITALS — BP 92/58 | HR 104 | Ht 72.0 in | Wt 217.6 lb

## 2020-04-10 DIAGNOSIS — I4819 Other persistent atrial fibrillation: Secondary | ICD-10-CM

## 2020-04-10 DIAGNOSIS — E118 Type 2 diabetes mellitus with unspecified complications: Secondary | ICD-10-CM

## 2020-04-10 DIAGNOSIS — C3411 Malignant neoplasm of upper lobe, right bronchus or lung: Secondary | ICD-10-CM | POA: Diagnosis not present

## 2020-04-10 DIAGNOSIS — Z794 Long term (current) use of insulin: Secondary | ICD-10-CM

## 2020-04-10 DIAGNOSIS — E785 Hyperlipidemia, unspecified: Secondary | ICD-10-CM

## 2020-04-10 DIAGNOSIS — C9000 Multiple myeloma not having achieved remission: Secondary | ICD-10-CM

## 2020-04-10 DIAGNOSIS — I739 Peripheral vascular disease, unspecified: Secondary | ICD-10-CM

## 2020-04-10 DIAGNOSIS — G4733 Obstructive sleep apnea (adult) (pediatric): Secondary | ICD-10-CM

## 2020-04-10 DIAGNOSIS — Z7901 Long term (current) use of anticoagulants: Secondary | ICD-10-CM

## 2020-04-10 DIAGNOSIS — I519 Heart disease, unspecified: Secondary | ICD-10-CM

## 2020-04-10 DIAGNOSIS — C3490 Malignant neoplasm of unspecified part of unspecified bronchus or lung: Secondary | ICD-10-CM

## 2020-04-10 DIAGNOSIS — I4510 Unspecified right bundle-branch block: Secondary | ICD-10-CM

## 2020-04-10 DIAGNOSIS — Z951 Presence of aortocoronary bypass graft: Secondary | ICD-10-CM

## 2020-04-10 LAB — SAMPLE TO BLOOD BANK

## 2020-04-10 LAB — CBC WITH DIFFERENTIAL (CANCER CENTER ONLY)
Abs Immature Granulocytes: 0.08 10*3/uL — ABNORMAL HIGH (ref 0.00–0.07)
Basophils Absolute: 0 10*3/uL (ref 0.0–0.1)
Basophils Relative: 1 %
Eosinophils Absolute: 0 10*3/uL (ref 0.0–0.5)
Eosinophils Relative: 0 %
HCT: 28.3 % — ABNORMAL LOW (ref 39.0–52.0)
Hemoglobin: 8.8 g/dL — ABNORMAL LOW (ref 13.0–17.0)
Immature Granulocytes: 1 %
Lymphocytes Relative: 16 %
Lymphs Abs: 1.2 10*3/uL (ref 0.7–4.0)
MCH: 32.5 pg (ref 26.0–34.0)
MCHC: 31.1 g/dL (ref 30.0–36.0)
MCV: 104.4 fL — ABNORMAL HIGH (ref 80.0–100.0)
Monocytes Absolute: 0.9 10*3/uL (ref 0.1–1.0)
Monocytes Relative: 12 %
Neutro Abs: 5.1 10*3/uL (ref 1.7–7.7)
Neutrophils Relative %: 70 %
Platelet Count: 137 10*3/uL — ABNORMAL LOW (ref 150–400)
RBC: 2.71 MIL/uL — ABNORMAL LOW (ref 4.22–5.81)
RDW: 23.6 % — ABNORMAL HIGH (ref 11.5–15.5)
WBC Count: 7.3 10*3/uL (ref 4.0–10.5)
nRBC: 0 % (ref 0.0–0.2)

## 2020-04-10 LAB — CMP (CANCER CENTER ONLY)
ALT: 6 U/L (ref 0–44)
AST: 12 U/L — ABNORMAL LOW (ref 15–41)
Albumin: 3.1 g/dL — ABNORMAL LOW (ref 3.5–5.0)
Alkaline Phosphatase: 103 U/L (ref 38–126)
Anion gap: 9 (ref 5–15)
BUN: 10 mg/dL (ref 8–23)
CO2: 29 mmol/L (ref 22–32)
Calcium: 9.1 mg/dL (ref 8.9–10.3)
Chloride: 103 mmol/L (ref 98–111)
Creatinine: 0.98 mg/dL (ref 0.61–1.24)
GFR, Est AFR Am: >60 mL/min (ref >60)
GFR, Estimated: >60 mL/min (ref >60)
Glucose, Bld: 145 mg/dL — ABNORMAL HIGH (ref 70–99)
Potassium: 3.6 mmol/L (ref 3.5–5.1)
Sodium: 141 mmol/L (ref 135–145)
Total Bilirubin: 0.6 mg/dL (ref 0.3–1.2)
Total Protein: 5.9 g/dL — ABNORMAL LOW (ref 6.5–8.1)

## 2020-04-10 MED ORDER — METOPROLOL SUCCINATE ER 100 MG PO TB24: ORAL_TABLET | ORAL | 3 refills | Status: DC

## 2020-04-10 NOTE — Patient Instructions (Signed)
Medication Instructions:  DO NOT TAKE YOUR LASIX  INCREASE YOUR METOPROLOL TO 125MG  DAILY (1 AND 1/4 TAB)  *If you need a refill on your cardiac medications before your next appointment, please call your pharmacy*    TESTING: Your physician has requested that you have an echocardiogram. Echocardiography is a painless test that uses sound waves to create images of your heart. It provides your doctor with information about the size and shape of your heart and how well your heart's chambers and valves are working. This procedure takes approximately one hour. There are no restrictions for this procedure.   Follow-Up: At [facility], you and your health needs are our priority.  As part of our continuing mission to provide you with exceptional heart care, we have created designated Provider Care Teams.  These Care Teams include your primary Cardiologist (physician) and Advanced Practice Providers (APPs -  Physician Assistants and Nurse Practitioners) who all work together to provide you with the care you need, when you need it.  We recommend signing up for the patient portal called "MyChart".  Sign up information is provided on this After Visit Summary.  MyChart is used to connect with patients for Virtual Visits (Telemedicine).  Patients are able to view lab/test results, encounter notes, upcoming appointments, etc.  Non-urgent messages can be sent to your provider as well.   To learn more about what you can do with MyChart, go to [email or web address].    Your next appointment:   12 month(s)  The format for your next appointment:   In Person  Provider:   Shelva Majestic, MD   Other Instructions AMBULATORY REFERRAL TO THE [facility]

## 2020-04-10 NOTE — Progress Notes (Signed)
Cardiology Office Note    Date:  04/11/2020   ID:  [name], DOB [birth year], MRN [id number]  PCP:  Luetta Nutting, DO  Cardiologist:  Shelva Majestic, MD (sleep); Dr. Gwenlyn Found  1 year follow-up sleep evaluation  History of Present Illness:  [name] is a 74 y.o. male who has a history of coronary artery disease and is status post CABG revascularization surgery 7 in March 1998. He also has a history of PVD and is status post bilateral iliac artery PTA and stenting, right renal artery PTA and stenting and also has a history of hypertension, hyperlipidemia, diabetes mellitus, and paroxysmal atrial fibrillation. He has a history of obstructive sleep apnea that was first diagnosed in 2009. A diagnostic sleep study done at the [facility] on 03/22/2008 showed an AHI of 13.4 per hour. However, during REM sleep AHI was 27.8 per hour. There was a positional component with supine sleep AHI at 38.7 versus nonsupine sleep at 9.9 per hour. He had significant oxygen desaturation to 84%. Over the last 9 years he has been using CPAP with 100% compliance. He had an old Respironics REM Star Auto CPAP machine. He machine malfunctioned and he was need for new machine. I was able to interrogate machine that he brought with him to the office. Over the past 30 days, his 30 day sleep averages 9 hours and 12 minutes. AHI is 0.8. He set at 14 cm water pressure. He has been using a chronotropic fullface mask. He typically goes to bed between 10 or 11 PM and wakes up around 7:30 AM. He has been buying his supplies online. He cannot sleep without his CPAP machine. He has a special adapter for portable machine since in the past when he had a power outage he could not sleep at all without therapy.  He had undergone cardioversion for atrial fibrillation and was recently seen in the atrial fibrillation clinic and was in sinus rhythm.  An Epworth Sleepiness Scale score was  calculated in the officewhen I saw him in December 2018and this endorsed at 3. At that time, his machine was over 26 years old and he was in need for new machine. He has transitioned to [employer] as his DME company and received a new ResMed AirSense 10 AutoSet CPAP machine on 09/09/2017. I obtained a new download from March 9 through 12/06/2017. He is 100% compliant and is averaging 8 hours and 37 minutes of sleep per night. At a 14 cm water pressure. AHI is excellent at 2.4. He uses a fullface mask. There is no leak. He notices a dramatic improvement with his new machine. Compared to his old machine. He is sleeping well. He denies residual snoring. He denies daytime sleepiness.  He underwent a cardioversion for recurrent atrial fibrillation in July 2019.  His heart rhythm has been stable ever since without recurrent AF.  I last evaluated him in a telemedicine visit on February 04, 2019.  At that time he was continuing to use CPAP with 100% compliance. He had significant chest congestion and coughing in December and January 2020 and pressures were increased to 17 cm at that time.  He was tested for COVID and was negative.  A download was obtained from May 7 through February 04, 2019 which continue to show 100% compliance.  Average sleep duration was 8 hours and 20 minutes with CPAP therapy.  At 17 cm water pressure AHI is excellent at 2.2/h.  There is  no leak.  He has a  ResMed F30 full facemask.  Since his last evaluation, he was diagnosed with small cell cancer.  His last chemotherapy cycle was last week and he was hospitalized with anemia.  He required 8 units of packed red blood cell transfusions over the past month.  He has been vaccinated for Covid.  He admits to trace ankle swelling right greater than left.  He continues to use CPAP.  A download was obtained from July 12 through April 10, 2020 which shows 100% compliance except for the 3 days that he was in the hospital.  AHI is  excellent at 1.5 at a set pressure of 18.6 cm of water.  There is no significant mask leak.  He presents for evaluation.   Past Medical History:  Diagnosis Date  . Atypical atrial flutter ([place])   . Cancer [facility])    Testicular- surgery   . COPD (chronic obstructive pulmonary disease) ([place])   . Coronary artery disease   . Diabetes mellitus without complication ([place])   . Diastolic dysfunction, left ventricle 05/2018   Dr. Rayann Heman- Cardiologist  . Dysrhythmia    chronic AFib  . History of kidney stones    passed  . HLD (hyperlipidemia)   . HTN (hypertension)   . Myocardial infarction ([place])    x 2 maybe 1 more  . OSA (obstructive sleep apnea)    uses CPAP  . Peripheral artery disease ([place])    pseudoaneurysm post afib ablation at [facility] 2011, s/p bilateral iliac stents  . Persistent atrial fibrillation ([place])   . Pre-diabetes   . Renal artery stenosis ([facility])    right renal artery PTA and stenting  . S/P coronary artery bypass graft x 7 11/16/96    Past Surgical History:  Procedure Laterality Date  . 2-D echocardiogram  03/25/2010   Normal left ventricular function. Mild MR, TR, trivial AR  . ATRIAL FIBRILLATION ABLATION  03/27/2010, 12/31/2010   [facility], Dr. Nadeen Landau  . BACK SURGERY  2004, 2007   Laminectomy, Discedectomy x 2  . BRONCHIAL BIOPSY  12/27/2019   Procedure: BRONCHIAL BIOPSIES;  Surgeon: Garner Nash, DO;  Location: [facility] ENDOSCOPY;  Service: Pulmonary;;  . BRONCHIAL BRUSHINGS  12/27/2019   Procedure: BRONCHIAL BRUSHINGS;  Surgeon: Garner Nash, DO;  Location: [facility];  Service: Pulmonary;;  . BRONCHIAL NEEDLE ASPIRATION BIOPSY  12/27/2019   Procedure: BRONCHIAL NEEDLE ASPIRATION BIOPSIES;  Surgeon: Garner Nash, DO;  Location: [facility] ENDOSCOPY;  Service: Pulmonary;;  . cardiac stress test  11/21/2009   Exercise capacity 5 METS. No significant ischemia demonstrated  . CARDIOVERSION N/A 03/15/2015   Procedure: CARDIOVERSION;  Surgeon: Lorretta Harp, MD;  Location: [facility]  ENDOSCOPY;  Service: Cardiovascular;  Laterality: N/A;  . CARDIOVERSION N/A 07/10/2017   Procedure: CARDIOVERSION;  Surgeon: Pixie Casino, MD;  Location: [facility] ENDOSCOPY;  Service: Cardiovascular;  Laterality: N/A;  . CARDIOVERSION N/A 03/23/2018   Procedure: CARDIOVERSION;  Surgeon: Sanda Klein, MD;  Location: [place] [facility] ENDOSCOPY;  Service: Cardiovascular;  Laterality: N/A;  . COLONOSCOPY WITH PROPOFOL N/A 04/05/2020   Procedure: COLONOSCOPY WITH PROPOFOL;  Surgeon: Carol Ada, MD;  Location: [facility] ENDOSCOPY;  Service: Endoscopy;  Laterality: N/A;  . CORONARY ARTERY BYPASS GRAFT  1998  . ELECTROMAGNETIC NAVIGATION BROCHOSCOPY  12/27/2019   Procedure: NAVIGATION BRONCHOSCOPY;  Surgeon: Garner Nash, DO;  Location: [facility] [facility] ENDOSCOPY;  Service: Pulmonary;;  . ESOPHAGOGASTRODUODENOSCOPY (EGD) WITH PROPOFOL N/A 04/05/2020   Procedure: ESOPHAGOGASTRODUODENOSCOPY (EGD) WITH PROPOFOL;  Surgeon: Carol Ada,  MD;  Location: [facility] ENDOSCOPY;  Service: Endoscopy;  Laterality: N/A;  . FIDUCIAL MARKER PLACEMENT  12/27/2019   Procedure: FIDUCIAL MARKER PLACEMENT;  Surgeon: Garner Nash, DO;  Location: [facility] ENDOSCOPY;  Service: Pulmonary;;  . HOT HEMOSTASIS N/A 04/05/2020   Procedure: HOT HEMOSTASIS (ARGON PLASMA COAGULATION/BICAP);  Surgeon: Carol Ada, MD;  Location: [facility];  Service: Endoscopy;  Laterality: N/A;  . [place]   for cancer  . POLYPECTOMY  04/05/2020   Procedure: POLYPECTOMY;  Surgeon: Carol Ada, MD;  Location: [facility] ENDOSCOPY;  Service: Endoscopy;;  . TOOTH EXTRACTION  05/27/2013   tooth extraction with bone graft- several -for Implants  . VIDEO BRONCHOSCOPY WITH ENDOBRONCHIAL NAVIGATION N/A 12/27/2019   Procedure: VIDEO BRONCHOSCOPY;  Surgeon: Garner Nash, DO;  Location: [place];  Service: Pulmonary;  Laterality: N/A;    Current Medications: Outpatient Medications Prior to Visit  Medication Sig Dispense Refill  . acetaminophen (TYLENOL) 500 MG tablet Take 1,000 mg every  6 (six) hours as needed by mouth (for pain.).    [name] atorvastatin (LIPITOR) 10 MG tablet TAKE 1 TABLET(10 MG) BY MOUTH DAILY (Patient taking differently: Take 10 mg by mouth every evening. TAKE 1 TABLET(10 MG) BY MOUTH DAILY) 90 tablet 2  . budesonide-formoterol (SYMBICORT) 160-4.5 MCG/ACT inhaler Inhale 2 puffs into the lungs 2 (two) times daily. 10.2 g 3  . cetirizine (ZYRTEC) 10 MG tablet Take 10 mg by mouth daily as needed for allergies.     [name] dronedarone (MULTAQ) 400 MG tablet TAKE 1 TABLET BY MOUTH TWICE DAILY WITH MEAL (Patient taking differently: Take 400 mg by mouth 2 (two) times daily with a meal. TAKE 1 TABLET BY MOUTH TWICE DAILY WITH MEAL) 180 tablet 1  . empagliflozin (JARDIANCE) 10 MG TABS tablet Take 10 mg by mouth daily. Schedule appt with PCP 90 tablet 2  . furosemide (LASIX) 40 MG tablet TAKE 1 TABLET BY MOUTH AS NEEDED (Patient taking differently: Take 40 mg by mouth daily as needed for fluid. ) 30 tablet 3  . ketotifen (ZADITOR) 0.025 % ophthalmic solution Place 1 drop into both eyes 2 (two) times daily as needed (allergies).    [name] lisinopril-hydrochlorothiazide (ZESTORETIC) 10-12.5 MG tablet TAKE 1 TABLET BY MOUTH DAILY (Patient taking differently: Take 1 tablet by mouth every evening. ) 90 tablet 1  . Multiple Vitamin (MULTIVITAMIN WITH MINERALS) TABS tablet Take 1 tablet by mouth daily. In the morning.    . niacin (NIASPAN) 1000 MG CR tablet TAKE 1 TABLET(1000 MG) BY MOUTH AT BEDTIME (Patient taking differently: Take 1,000 mg by mouth at bedtime. ) 90 tablet 3  . nicotine polacrilex (COMMIT) 2 MG lozenge Take 2 mg by mouth as needed for smoking cessation.    . nitroGLYCERIN (NITROSTAT) 0.4 MG SL tablet Place 1 tablet (0.4 mg total) under the tongue every 5 (five) minutes as needed. 25 tablet 3  . NON FORMULARY CPAP at night    . oxymetazoline (AFRIN) 0.05 % nasal spray Place 1 spray into both nostrils 2 (two) times daily as needed for congestion.    . pantoprazole (PROTONIX) 40 MG  tablet Take 1 tablet (40 mg total) by mouth daily. 30 tablet 2  . prochlorperazine (COMPAZINE) 10 MG tablet TAKE 1 TABLET(10 MG) BY MOUTH EVERY 6 HOURS AS NEEDED FOR NAUSEA OR VOMITING (Patient taking differently: Take 10 mg by mouth every 6 (six) hours as needed for nausea or vomiting. ) 30 tablet 0  . traZODone (DESYREL) 50 MG tablet TAKE  1/2 TO 1 TABLET BY MOUTH AT BEDTIME AS NEEDED FOR SLEEP (Patient taking differently: Take 50 mg by mouth at bedtime. ) 90 tablet 3  . VENTOLIN HFA 108 (90 Base) MCG/ACT inhaler INHALE 2 PUFFS INTO THE LUNGS EVERY 6 HOURS AS NEEDED FOR WHEEZING OR SHORTNESS OF BREATH (Patient taking differently: Inhale 2 puffs into the lungs every 6 (six) hours as needed (wheezing/shortness of breath.). ) 18 g 1  . XARELTO 20 MG TABS tablet TAKE 1 TABLET BY MOUTH EVERY DAY WITH SUPPER (Patient taking differently: Take 20 mg by mouth every evening. ) 90 tablet 1  . metoprolol succinate (TOPROL-XL) 100 MG 24 hr tablet TAKE 1 TABLET(100 MG) BY MOUTH DAILY (Patient taking differently: Take 100 mg by mouth every evening. TAKE 1 TABLET(100 MG) BY MOUTH DAILY) 90 tablet 3  . gabapentin (NEURONTIN) 300 MG capsule Take 2 capsules (600 mg total) by mouth at bedtime. 180 capsule 2   No facility-administered medications prior to visit.     Allergies:   Patient has no known allergies.   Social History   Socioeconomic History  . Marital status: Divorced    Spouse name: Not on file  . Number of children: 2  . Years of education: 33  . Highest education level: Associate degree: academic program  Occupational History  . Occupation: retired    Comment: Oncologist of [employer]  Tobacco Use  . Smoking status: Former Smoker    Packs/day: 1.50    Years: 30.00    Pack years: 45.00    Types: Cigarettes, Cigars    Quit date: 09/01/1994    Years since quitting: 25.6  . Smokeless tobacco: Never Used  . Tobacco comment: cigars quit  - 11/16/2019  Vaping Use  . Vaping Use: Never used    Substance and Sexual Activity  . Alcohol use: Not Currently  . Drug use: No  . Sexual activity: Yes  Other Topics Concern  . Not on file  Social History Narrative   Pt lives in [place] alone.  Divorced. 2 cups of coffee in the morning.   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 Difficulty of Paying Living Expenses:   Food Insecurity:   . Worried About Programme researcher, broadcasting/film/video in the Last Year:   . Barista in the Last Year:   Transportation Needs:   . Freight forwarder (Medical):   [name] Lack of Transportation (Non-Medical):   Physical Activity:   . Days of Exercise per Week:   . Minutes of Exercise per Session:   Stress:   . Feeling of Stress :   Social Connections:   . Frequency of Communication with Friends and Family:   . Frequency of Social Gatherings with Friends and Family:   . Attends Religious Services:   . Active Member of Clubs or Organizations:   . Attends Banker Meetings:   [name] Marital Status:      Family History:  The patient's family history includes Cancer in his father and mother; Leukemia (age of onset: 51) in his daughter; Other in his brother and son.   ROS General: Negative; No fevers, chills, or night sweats;  HEENT: Negative; No changes in vision or hearing, sinus congestion, difficulty swallowing Pulmonary: Negative; No cough, wheezing, shortness of breath, hemoptysis Cardiovascular: Negative; No chest pain, presyncope, syncope, palpitations GI: Negative; No nausea, vomiting, diarrhea, or abdominal pain GU: Negative; No  dysuria, hematuria, or difficulty voiding Musculoskeletal: Negative; no myalgias, joint pain, or weakness Hematologic/Oncology: Positive for small cell lung cancer diagnosed April 2021 Endocrine: Negative; no heat/cold intolerance; no diabetes Neuro: Negative; no changes in balance, headaches Skin: Negative; No rashes or skin  lesions Psychiatric: Negative; No behavioral problems, depression Sleep: Positive for OSA on CPAP with excellent compliance;  no breakthroughsnoring, daytime sleepiness, hypersomnolence; no bruxism, restless legs, hypnogognic hallucinations, no cataplexy Other comprehensive 14 point system review is negative.   PHYSICAL EXAM:   VS:  BP (!) 92/58   Pulse (!) 104   Ht 6' (1.829 m)   Wt 217 lb 9.6 oz (98.7 kg)   BMI 29.51 kg/m     Repeat blood pressure was 104/60  Wt Readings from Last 3 Encounters:  04/10/20 217 lb 9.6 oz (98.7 kg)  04/05/20 213 lb 3 oz (96.7 kg)  03/20/20 218 lb 1.6 oz (98.9 kg)    General: Alert, oriented, no distress.  Skin: normal turgor, no rashes, warm and dry HEENT: Normocephalic, atraumatic. Pupils equal round and reactive to light; sclera anicteric; extraocular muscles intact;  Nose without nasal septal hypertrophy Mouth/Parynx benign; Mallinpatti scale 3 Neck: No JVD, no carotid bruits; normal carotid upstroke Lungs: clear to ausculatation and percussion; no wheezing or rales Chest wall: without tenderness to palpitation Heart: PMI not displaced, irregular irregular rhythm rate increased., s1 s2 normal, 1/6 systolic murmur, no diastolic murmur, no rubs, gallops, thrills, or heaves Abdomen: soft, nontender; no hepatosplenomehaly, BS+; abdominal aorta nontender and not dilated by palpation. Back: no CVA tenderness Pulses 2+ Musculoskeletal: full range of motion, normal strength, no joint deformities Extremities: no clubbing cyanosis or edema, Homan's sign negative  Neurologic: grossly nonfocal; Cranial nerves grossly wnl Psychologic: Normal mood and affect   Studies/Labs Reviewed:   EKG:  EKG is ordered today.  Atrial fibrillation with ventricular rate at 104.  Right bundle branch block.  QTc interval 547 ms.  April 2019 ECG (independently read by me): Normal sinus rhythm at 65 bpm with occasional PACs.  Right bundle branch block with repolarization  changes.  TC interval '4 8 6 '$ ms.  August 20, 2017 ECG (independently read by me):  Sinus bradycardia with sinu arrhythmia.  Right bunde branch block  Recent Labs: BMP Latest Ref Rng & Units 04/10/2020 04/06/2020 04/05/2020  Glucose 70 - 99 mg/dL 145(H) 132(H) 128(H)  BUN 8 - 23 mg/dL '10 13 12  '$ Creatinine 0.61 - 1.24 mg/dL 0.98 1.02 1.11  BUN/Creat Ratio 6 - 22 (calc) - - -  Sodium 135 - 145 mmol/L 141 138 138  Potassium 3.5 - 5.1 mmol/L 3.6 3.5 3.5  Chloride 98 - 111 mmol/L 103 101 100  CO2 22 - 32 mmol/L '29 27 25  '$ Calcium 8.9 - 10.3 mg/dL 9.1 8.8(L) 8.8(L)     Hepatic Function Latest Ref Rng & Units 04/10/2020 04/03/2020 03/27/2020  Total Protein 6.5 - 8.1 g/dL 5.9(L) 5.8(L) 5.6(L)  Albumin 3.5 - 5.0 g/dL 3.1(L) 3.3(L) 3.3(L)  AST 15 - 41 U/L 12(L) 13(L) 15  ALT 0 - 44 U/L '6 9 16  '$ Alk Phosphatase 38 - 126 U/L 103 117 112  Total Bilirubin 0.3 - 1.2 mg/dL 0.6 0.5 0.9  Bilirubin, Direct <=0.2 mg/dL - - -    CBC Latest Ref Rng & Units 04/10/2020 04/06/2020 04/05/2020  WBC 4.0 - 10.5 K/uL 7.3 13.8(H) 11.7(H)  Hemoglobin 13.0 - 17.0 g/dL 8.8(L) 8.1(L) 8.5(L)  Hematocrit 39 - 52 % 28.3(L) 25.1(L) 26.4(L)  Platelets 150 -  400 K/uL 137(L) 68(L) 55(L)   Lab Results  Component Value Date   MCV 104.4 (H) 04/10/2020   MCV 105.9 (H) 04/06/2020   MCV 104.8 (H) 04/05/2020   Lab Results  Component Value Date   TSH 1.185 06/30/2017   Lab Results  Component Value Date   HGBA1C 6.7 (H) 09/14/2019     BNP No results found for: BNP  ProBNP No results found for: PROBNP   Lipid Panel     Component Value Date/Time   CHOL 99 05/27/2018 0910   TRIG 71 05/27/2018 0910   HDL 44 05/27/2018 0910   CHOLHDL 2.3 05/27/2018 0910   VLDL 14 03/18/2016 0822   LDLCALC 40 05/27/2018 0910   LDLDIRECT 48 01/25/2019 1049     RADIOLOGY: No results found.   Additional studies/ records that were reviewed today include:    ECHO: 07/27/2019 IMPRESSIONS  1. Left ventricular ejection fraction, by  visual estimation, is 40 to  45%. The left ventricle has mild to moderately decreased function. There  is no left ventricular hypertrophy.  2. Mid and apical anterior septum, apical lateral segment, apical  anterior segment, and apex are hypokinetic.  3. Definity contrast agent was given IV to delineate the left ventricular  endocardial borders.  4. Elevated left atrial pressure.  5. Left ventricular diastolic parameters are consistent with Grade II  diastolic dysfunction (pseudonormalization).  6. The left ventricle demonstrates regional wall motion abnormalities.  7. Global right ventricle has moderately reduced systolic function.The  right ventricular size is normal. No increase in right ventricular wall  thickness.  8. Left atrial size was mildly dilated.  9. Right atrial size was mildly dilated.  10. The mitral valve is normal in structure. No evidence of mitral valve  regurgitation.  11. The tricuspid valve is normal in structure. Tricuspid valve  regurgitation mild-moderate.  12. The aortic valve is normal in structure. Aortic valve regurgitation is  not visualized.  13. The pulmonic valve was grossly normal. Pulmonic valve regurgitation is  not visualized.  14. Mildly elevated pulmonary artery systolic pressure.  15. The inferior vena cava is dilated in size with >50% respiratory  variability, suggesting right atrial pressure of 8 mmHg.  16. No intracardiac thrombi or masses were visualized.   In comparison to the previous echocardiogram(s): 05/13/18 EF 45-50%. PA  pressure 43mHg.  There is little change.    ASSESSMENT:    1. Persistent atrial fibrillation ([place]   2. OSA (obstructive sleep apnea)   3. Peripheral artery disease ([place]   4. Bundle branch block, right   5. Left ventricular dysfunction   6. Status post coronary artery bypass grafting x 7, 11/16/1996   7. Type 2 diabetes mellitus with complication, with long-term current use of insulin ([facility]   8.  Anticoagulated   9. Hyperlipidemia with target LDL less than 70   10. Small cell carcinoma of lung ([facility]     PLAN:  Mr. SArmin Yergeris a 74year old gentleman who has a history of ADD, status post CABG revascularization surgery x7 in March 1998, peripheral vascular disease, status post bilateral iliac artery PTA and stenting, right renal artery PTA and stenting as well as a history of hypertension, diabetes mellitus, and paroxysmal fibrillation status post multiple DC cardioversions as well [facility]  Since his last yearly evaluation with me, he was diagnosed with lung cancer which presented with right upper lobe pulmonary nodule that was hypermetabolic and biopsy-proven  to be small cell carcinoma.  He has had issues with significant anemia from his chemotherapy and over the past month states that he required 8 units of packed red blood cell transfusion.  Presently, he is in atrial fibrillation of questionable duration.  He continues to be on metoprolol succinate 100 mg daily his ventricular rate today is 104.  He also is on Multaq 400 mg twice a day.  I have recommended further titration of metoprolol succinate to 125 mg daily.  He has been on lisinopril HCT 10/12.5 mg and also has been taking furosemide.  I recommended he hold furosemide particularly with his low blood pressure.  He is on atorvastatin for hyperlipidemia.  He is diabetic on Jardiance.  I am scheduling him for follow-up echo Doppler study to be done this week and will arrange for him to be seen in our atrial fibrillation clinic next week for further evaluation and treatment of his recurrent atrial fibrillation.  He continues to be on Xarelto for anticoagulation.  He is on CPAP for obstructive sleep apnea and his most recent download was received which reveals excellent compliance.  He is requiring a high pressure at 18.6 cm and AHI is excellent at 1.5.  He is averaging 8 hours and 34 minutes of CPAP  use per night.  He will return to the cardiology care of Dr. Gwenlyn Found.  I will see him for sleep evaluation in 1 year.   Medication Adjustments/Labs and Tests Ordered: Current medicines are reviewed at length with the patient today.  Concerns regarding medicines are outlined above.  Medication changes, Labs and Tests ordered today are listed in the Patient Instructions below. Patient Instructions  Medication Instructions:  DO NOT TAKE YOUR LASIX  INCREASE YOUR METOPROLOL TO '125MG'$  DAILY (1 AND 1/4 TAB)  *If you need a refill on your cardiac medications before your next appointment, please call your pharmacy*    TESTING: Your physician has requested that you have an echocardiogram. Echocardiography is a painless test that uses sound waves to create images of your heart. It provides your doctor with information about the size and shape of your heart and how well your heart's chambers and valves are working. This procedure takes approximately one hour. There are no restrictions for this procedure.   Follow-Up: At [facility], you and your health needs are our priority.  As part of our continuing mission to provide you with exceptional heart care, we have created designated Provider Care Teams.  These Care Teams include your primary Cardiologist (physician) and Advanced Practice Providers (APPs -  Physician Assistants and Nurse Practitioners) who all work together to provide you with the care you need, when you need it.  We recommend signing up for the patient portal called "MyChart".  Sign up information is provided on this After Visit Summary.  MyChart is used to connect with patients for Virtual Visits (Telemedicine).  Patients are able to view lab/test results, encounter notes, upcoming appointments, etc.  Non-urgent messages can be sent to your provider as well.   To learn more about what you can do with MyChart, go to [email or web address].    Your next appointment:   12 month(s)  The  format for your next appointment:   In Person  Provider:   Shelva Majestic, MD   Other Instructions AMBULATORY REFERRAL TO THE AFIB CLINIC     Signed, Shelva Majestic, MD  04/11/2020 5:19 PM    [facility] [address], [address], [place],  [place]  [address] Phone: [phone]

## 2020-04-11 ENCOUNTER — Encounter: Payer: Self-pay | Admitting: Cardiovascular Disease

## 2020-04-18 ENCOUNTER — Encounter (HOSPITAL_COMMUNITY): Payer: Self-pay

## 2020-04-18 ENCOUNTER — Other Ambulatory Visit: Payer: Self-pay | Admitting: Family Medicine

## 2020-04-18 ENCOUNTER — Other Ambulatory Visit: Payer: Medicare Other

## 2020-04-18 ENCOUNTER — Ambulatory Visit (HOSPITAL_COMMUNITY)
Admission: RE | Admit: 2020-04-18 | Discharge: 2020-04-18 | Disposition: A | Discharge status: Home / Self Care | Payer: Medicare Other | Source: Ambulatory Visit | Attending: Physician Assistant | Admitting: Physician Assistant

## 2020-04-18 ENCOUNTER — Other Ambulatory Visit: Payer: Self-pay

## 2020-04-18 ENCOUNTER — Ambulatory Visit: Payer: Medicare Other | Admitting: Physician Assistant

## 2020-04-18 DIAGNOSIS — J90 Pleural effusion, not elsewhere classified: Secondary | ICD-10-CM | POA: Diagnosis not present

## 2020-04-18 DIAGNOSIS — I2510 Atherosclerotic heart disease of native coronary artery without angina pectoris: Secondary | ICD-10-CM | POA: Diagnosis not present

## 2020-04-18 DIAGNOSIS — I700 Atherosclerosis of aorta: Secondary | ICD-10-CM | POA: Diagnosis not present

## 2020-04-18 DIAGNOSIS — C3490 Malignant neoplasm of unspecified part of unspecified bronchus or lung: Secondary | ICD-10-CM | POA: Diagnosis not present

## 2020-04-18 DIAGNOSIS — C3411 Malignant neoplasm of upper lobe, right bronchus or lung: Secondary | ICD-10-CM

## 2020-04-18 MED ORDER — IOHEXOL 300 MG/ML  SOLN
75.0000 mL | Freq: Once | INTRAMUSCULAR | Status: AC | PRN
  Administered 2020-04-18: 75 mL via INTRAVENOUS

## 2020-04-18 MED ORDER — SODIUM CHLORIDE (PF) 0.9 % IJ SOLN
INTRAMUSCULAR | Status: AC
  Filled 2020-04-18: qty 50

## 2020-04-19 ENCOUNTER — Ambulatory Visit (HOSPITAL_COMMUNITY)
Admission: RE | Admit: 2020-04-19 | Discharge: 2020-04-19 | Disposition: A | Discharge status: Home / Self Care | Payer: Medicare Other | Source: Ambulatory Visit | Attending: Physician Assistant | Admitting: Physician Assistant

## 2020-04-19 VITALS — BP 108/58 | HR 99 | Ht 72.0 in | Wt 216.4 lb

## 2020-04-19 DIAGNOSIS — C3490 Malignant neoplasm of unspecified part of unspecified bronchus or lung: Secondary | ICD-10-CM | POA: Insufficient documentation

## 2020-04-19 DIAGNOSIS — Z7984 Long term (current) use of oral hypoglycemic drugs: Secondary | ICD-10-CM | POA: Insufficient documentation

## 2020-04-19 DIAGNOSIS — I484 Atypical atrial flutter: Secondary | ICD-10-CM | POA: Insufficient documentation

## 2020-04-19 DIAGNOSIS — I082 Rheumatic disorders of both aortic and tricuspid valves: Secondary | ICD-10-CM | POA: Insufficient documentation

## 2020-04-19 DIAGNOSIS — I4510 Unspecified right bundle-branch block: Secondary | ICD-10-CM | POA: Insufficient documentation

## 2020-04-19 DIAGNOSIS — E785 Hyperlipidemia, unspecified: Secondary | ICD-10-CM | POA: Insufficient documentation

## 2020-04-19 DIAGNOSIS — Z79899 Other long term (current) drug therapy: Secondary | ICD-10-CM | POA: Diagnosis not present

## 2020-04-19 DIAGNOSIS — Z951 Presence of aortocoronary bypass graft: Secondary | ICD-10-CM | POA: Insufficient documentation

## 2020-04-19 DIAGNOSIS — I252 Old myocardial infarction: Secondary | ICD-10-CM | POA: Insufficient documentation

## 2020-04-19 DIAGNOSIS — D6869 Other thrombophilia: Secondary | ICD-10-CM

## 2020-04-19 DIAGNOSIS — I2510 Atherosclerotic heart disease of native coronary artery without angina pectoris: Secondary | ICD-10-CM | POA: Diagnosis not present

## 2020-04-19 DIAGNOSIS — J449 Chronic obstructive pulmonary disease, unspecified: Secondary | ICD-10-CM | POA: Diagnosis not present

## 2020-04-19 DIAGNOSIS — Z95820 Peripheral vascular angioplasty status with implants and grafts: Secondary | ICD-10-CM | POA: Insufficient documentation

## 2020-04-19 DIAGNOSIS — Z87891 Personal history of nicotine dependence: Secondary | ICD-10-CM | POA: Insufficient documentation

## 2020-04-19 DIAGNOSIS — Z7951 Long term (current) use of inhaled steroids: Secondary | ICD-10-CM | POA: Insufficient documentation

## 2020-04-19 DIAGNOSIS — E1151 Type 2 diabetes mellitus with diabetic peripheral angiopathy without gangrene: Secondary | ICD-10-CM | POA: Diagnosis not present

## 2020-04-19 DIAGNOSIS — G4733 Obstructive sleep apnea (adult) (pediatric): Secondary | ICD-10-CM | POA: Diagnosis not present

## 2020-04-19 DIAGNOSIS — I701 Atherosclerosis of renal artery: Secondary | ICD-10-CM | POA: Diagnosis not present

## 2020-04-19 DIAGNOSIS — I255 Ischemic cardiomyopathy: Secondary | ICD-10-CM | POA: Diagnosis not present

## 2020-04-19 DIAGNOSIS — I10 Essential (primary) hypertension: Secondary | ICD-10-CM | POA: Insufficient documentation

## 2020-04-19 DIAGNOSIS — I4819 Other persistent atrial fibrillation: Secondary | ICD-10-CM | POA: Diagnosis not present

## 2020-04-19 DIAGNOSIS — Z9989 Dependence on other enabling machines and devices: Secondary | ICD-10-CM | POA: Insufficient documentation

## 2020-04-19 DIAGNOSIS — Z7901 Long term (current) use of anticoagulants: Secondary | ICD-10-CM | POA: Insufficient documentation

## 2020-04-19 NOTE — H&P (View-Only) (Signed)
Primary Care Physician: Luetta Nutting, DO Referring Physician: Dr. Gwenlyn Found EP: Dr. Casandra Doffing Dauphin is a 74 y.o. male with a h/o  PAF, OSA using cpap, HTN, CAD, s/p bypass, s/p ablation x 2, that is here for f/u, on Multaq, as he has had afib since last week occurring on vacation  while he was at the beach.  He is tolerating fairly well, but notices more nervousness. He tracks his HR on his phone app. He saw Dr. Rayann Heman  last  04/04/15 at which time he was offered repeat ablation,  Multaq, sotalol or tikosyn. He chose multaq and has kept him in rhythm  X almost 3 years. He did require a cardioversion 7 months ago.  Otherwise health has been at his baseline. Continues on xarelto without any missed doses. His qt is of concern usually around 500 ms,(RBBB contributing ), longer today in atrial flutter. He is fatigued out of rhythm and prefers to be in SR. He also prefers to avoid another ablation.   F/u in afib clinic,7/30. Unfortunately, he did not convert with cardioversion, but 3 days later, he returned to [place] , and continues to stay in rhythm. He feels improved. I discussed with Dr. Rayann Heman  prior to [place] as his options are limited. He  thought repeat cardioversion was the best option  for pt.   F/u virtual afib clinic 01/28/19. Pt reports no major issues with afib. Will feel his HR pick up at times but can get it back in rhythm in a matter of minutes with deep breaths. He had a prolonged URI in December, was tested for covid antibodies recently and was negative. He is having to use prn lasix once a week and will lose 3-4 lbs but has gotten into the habit of eating pretzels daily. He is now able to get out and golf and he is happy about that. He is taking covid precautions.  Follow up in the AF clinic 12/14/19. Patient reports he is doing very well since his last visit. He has lost between 15-20 lbs by reducing his portion sizes. He has had two episodes of heart racing which resolved in <2 minutes.  He denies bleeding issues with anticoagulation.   Follow up in the AF clinic 04/19/20. Patient has been diagnosed with early stages of small cell lung cancer. Patient was admitted 04/03/20-04/06/20 for acute anemia requiring transfusion. Anemia appeared to be multifactorial with bone marrow suppression from chemo and GI bleeding. Upper and lower EGD were negative for active bleeding. Xarelto was resumed once platelets were >50,000. He reports that he has felt fatigued since discharge. ECG on 04/10/20 showed he was back out of rhythm.   Today, he denies symptoms of palpitations, chest pain, shortness of breath, orthopnea, PND, lower extremity edema, dizziness, presyncope, syncope, or neurologic sequela. The patient is tolerating medications without difficulties and is otherwise without complaint today.   Past Medical History:  Diagnosis Date  . Atypical atrial flutter ([place])   . Cancer [facility])    Testicular- surgery   . COPD (chronic obstructive pulmonary disease) ([place])   . Coronary artery disease   . Diabetes mellitus without complication ([place])   . Diastolic dysfunction, left ventricle 05/2018   Dr. Rayann Heman- Cardiologist  . Dysrhythmia    chronic AFib  . History of kidney stones    passed  . HLD (hyperlipidemia)   . HTN (hypertension)   . Myocardial infarction ([place])    x 2 maybe 1 more  .  OSA (obstructive sleep apnea)    uses CPAP  . Peripheral artery disease ([place])    pseudoaneurysm post afib ablation at [facility] 2011, s/p bilateral iliac stents  . Persistent atrial fibrillation ([place])   . Pre-diabetes   . Renal artery stenosis ([facility])    right renal artery PTA and stenting  . S/P coronary artery bypass graft x 7 11/16/96   Past Surgical History:  Procedure Laterality Date  . 2-D echocardiogram  03/25/2010   Normal left ventricular function. Mild MR, TR, trivial AR  . ATRIAL FIBRILLATION ABLATION  03/27/2010, 12/31/2010   [facility], Dr. Nadeen Landau  . BACK SURGERY  2004, 2007   Laminectomy, Discedectomy x  2  . BRONCHIAL BIOPSY  12/27/2019   Procedure: BRONCHIAL BIOPSIES;  Surgeon: Garner Nash, DO;  Location: [facility] ENDOSCOPY;  Service: Pulmonary;;  . BRONCHIAL BRUSHINGS  12/27/2019   Procedure: BRONCHIAL BRUSHINGS;  Surgeon: Garner Nash, DO;  Location: [facility];  Service: Pulmonary;;  . BRONCHIAL NEEDLE ASPIRATION BIOPSY  12/27/2019   Procedure: BRONCHIAL NEEDLE ASPIRATION BIOPSIES;  Surgeon: Garner Nash, DO;  Location: [facility];  Service: Pulmonary;;  . cardiac stress test  11/21/2009   Exercise capacity 5 METS. No significant ischemia demonstrated  . CARDIOVERSION N/A 03/15/2015   Procedure: CARDIOVERSION;  Surgeon: Lorretta Harp, MD;  Location: [facility] END[facility]COPY;  Service: Cardiovascular;  Laterality: N/A;  . CARDIOVERSION N/A 07/10/2017   Procedure: CARDIOVERSION;  Surgeon: Pixie Casino, MD;  Location: [facility] ENDOSCOPY;  Service: Cardiovascular;  Laterality: N/A;  . CARDIOVERSION N/A 03/23/2018   Procedure: CARDIOVERSION;  Surgeon: Sanda Klein, MD;  Location: [facility] ENDOSCOPY;  Service: Cardiovascular;  Laterality: N/A;  . COLONOSCOPY WITH PROPOFOL N/A 04/05/2020   Procedure: COLONOSCOPY WITH PROPOFOL;  Surgeon: Carol Ada, MD;  Location: [facility] ENDOSCOPY;  Service: Endoscopy;  Laterality: N/A;  . CORONARY ARTERY BYPASS GRAFT  1998  . ELECTROMAGNETIC NAVIGATION BROCHOSCOPY  12/27/2019   Procedure: NAVIGATION BRONCHOSCOPY;  Surgeon: Garner Nash, DO;  Location: [facility] ENDOSCOPY;  Service: Pulmonary;;  . ESOPHAGOGASTRODUODENOSCOPY (EGD) WITH PROPOFOL N/A 04/05/2020   Procedure: ESOPHAGOGASTRODUODENOSCOPY (EGD) WITH PROPOFOL;  Surgeon: Carol Ada, MD;  Location: [facility] ENDOSCOPY;  Service: Endoscopy;  Laterality: N/A;  . FIDUCIAL MARKER PLACEMENT  12/27/2019   Procedure: FIDUCIAL MARKER PLACEMENT;  Surgeon: Garner Nash, DO;  Location: [facility] ENDOSCOPY;  Service: Pulmonary;;  . HOT HEMOSTASIS N/A 04/05/2020   Procedure: HOT HEMOSTASIS (ARGON PLASMA COAGULATION/BICAP);  Surgeon: Carol Ada, MD;  Location: [facility];  Service: Endoscopy;  Laterality: N/A;  . [place]   for cancer  . POLYPECTOMY  04/05/2020   Procedure: POLYPECTOMY;  Surgeon: Carol Ada, MD;  Location: [facility] ENDOSCOPY;  Service: Endoscopy;;  . TOOTH EXTRACTION  05/27/2013   tooth extraction with bone graft- several -for Implants  . VIDEO BRONCHOSCOPY WITH ENDOBRONCHIAL NAVIGATION N/A 12/27/2019   Procedure: VIDEO BRONCHOSCOPY;  Surgeon: Garner Nash, DO;  Location: [facility];  Service: Pulmonary;  Laterality: N/A;    Current Outpatient Medications  Medication Sig Dispense Refill  . acetaminophen (TYLENOL) 500 MG tablet Take 1,000 mg every 6 (six) hours as needed by mouth (for pain.).    [name] atorvastatin (LIPITOR) 10 MG tablet TAKE 1 TABLET(10 MG) BY MOUTH DAILY (Patient taking differently: Take 10 mg by mouth every evening. TAKE 1 TABLET(10 MG) BY MOUTH DAILY) 90 tablet 2  . budesonide-formoterol (SYMBICORT) 160-4.5 MCG/ACT inhaler Inhale 2 puffs into the lungs 2 (two) times daily. 10.2 g 3  . cetirizine (ZYRTEC) 10  MG tablet Take 10 mg by mouth daily as needed for allergies.     [name] dronedarone (MULTAQ) 400 MG tablet TAKE 1 TABLET BY MOUTH TWICE DAILY WITH MEAL (Patient taking differently: Take 400 mg by mouth 2 (two) times daily with a meal. TAKE 1 TABLET BY MOUTH TWICE DAILY WITH MEAL) 180 tablet 1  . empagliflozin (JARDIANCE) 10 MG TABS tablet Take 10 mg by mouth daily. Schedule appt with PCP 90 tablet 2  . furosemide (LASIX) 40 MG tablet TAKE 1 TABLET BY MOUTH AS NEEDED (Patient taking differently: Take 40 mg by mouth daily as needed for fluid. ) 30 tablet 3  . gabapentin (NEURONTIN) 300 MG capsule Take 2 capsules (600 mg total) by mouth at bedtime. 180 capsule 2  . ketotifen (ZADITOR) 0.025 % ophthalmic solution Place 1 drop into both eyes 2 (two) times daily as needed (allergies).    [name] lisinopril-hydrochlorothiazide (ZESTORETIC) 10-12.5 MG tablet TAKE 1 TABLET BY MOUTH DAILY (Patient  taking differently: Take 1 tablet by mouth every evening. ) 90 tablet 1  . metoprolol succinate (TOPROL-XL) 100 MG 24 hr tablet TAKE 125MG  (1 AND 1/4 TAB) DAILY 90 tablet 3  . Multiple Vitamin (MULTIVITAMIN WITH MINERALS) TABS tablet Take 1 tablet by mouth daily. In the morning.    . niacin (NIASPAN) 1000 MG CR tablet TAKE 1 TABLET(1000 MG) BY MOUTH AT BEDTIME (Patient taking differently: Take 1,000 mg by mouth at bedtime. ) 90 tablet 3  . nicotine polacrilex (COMMIT) 2 MG lozenge Take 2 mg by mouth as needed for smoking cessation.    . nitroGLYCERIN (NITROSTAT) 0.4 MG SL tablet Place 1 tablet (0.4 mg total) under the tongue every 5 (five) minutes as needed. 25 tablet 3  . NON FORMULARY CPAP at night    . oxymetazoline (AFRIN) 0.05 % nasal spray Place 1 spray into both nostrils 2 (two) times daily as needed for congestion.    . pantoprazole (PROTONIX) 40 MG tablet Take 1 tablet (40 mg total) by mouth daily. 30 tablet 2  . prochlorperazine (COMPAZINE) 10 MG tablet TAKE 1 TABLET(10 MG) BY MOUTH EVERY 6 HOURS AS NEEDED FOR NAUSEA OR VOMITING (Patient taking differently: Take 10 mg by mouth every 6 (six) hours as needed for nausea or vomiting. ) 30 tablet 0  . traZODone (DESYREL) 50 MG tablet TAKE 1/2 TO 1 TABLET BY MOUTH AT BEDTIME AS NEEDED FOR SLEEP (Patient taking differently: Take 50 mg by mouth at bedtime. ) 90 tablet 3  . VENTOLIN HFA 108 (90 Base) MCG/ACT inhaler INHALE 2 PUFFS INTO THE LUNGS EVERY 6 HOURS AS NEEDED FOR WHEEZING OR SHORTNESS OF BREATH (Patient taking differently: Inhale 2 puffs into the lungs every 6 (six) hours as needed (wheezing/shortness of breath.). ) 18 g 1  . XARELTO 20 MG TABS tablet TAKE 1 TABLET BY MOUTH EVERY DAY WITH SUPPER (Patient taking differently: Take 20 mg by mouth every evening. ) 90 tablet 1   No current facility-administered medications for this visit.    No Known Allergies  Social History   Socioeconomic History  . Marital status: Divorced    Spouse  name: Not on file  . Number of children: 2  . Years of education: 33  . Highest education level: Associate degree: academic program  Occupational History  . 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 Smoking status: Former Smoker    Packs/day: 1.50    Years: 30.00    Pack  years: 45.00    Types: Cigarettes, Cigars    Quit date: 09/01/1994    Years since quitting: 25.6  . Smokeless tobacco: Never Used  . Tobacco comment: cigars quit  - 11/16/2019  Vaping Use  . Vaping Use: Never used  Substance and Sexual Activity  . Alcohol use: Not Currently  . Drug use: No  . Sexual activity: Yes  Other Topics Concern  . Not on file  Social History Narrative   Pt lives in [place] alone.  Divorced. 2 cups of coffee in the morning.   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 Difficulty of Paying Living Expenses: Not on file  Food Insecurity:   . Worried About Charity fundraiser in the Last Year: Not on file  . Ran Out of Food in the Last Year: Not on file  Transportation Needs:   . Lack of Transportation (Medical): Not on file  . Lack of Transportation (Non-Medical): Not on file  Physical Activity:   . Days of Exercise per Week: Not on file  . Minutes of Exercise per Session: Not on file  Stress:   . Feeling of Stress : Not on file  Social Connections:   . Frequency of Communication with Friends and Family: Not on file  . Frequency of Social Gatherings with Friends and Family: Not on file  . Attends Religious Services: Not on file  . Active Member of Clubs or Organizations: Not on file  . Attends Archivist Meetings: Not on file  . Marital Status: Not on file  Intimate Partner Violence:   . Fear of Current or Ex-Partner: Not on file  . Emotionally Abused: Not on file  . Physically Abused: Not on file  . Sexually Abused: Not on file    Family History  Problem Relation Age  of Onset  . Cancer Mother   . Cancer Father   . Other Brother        NO MEDICAL PROBLEMS  . Other Son        NO MEDICAL PROBLEMS  . Leukemia Daughter 6       Recover and has no other problems    ROS- All systems are reviewed and negative except as per the HPI above  Physical Exam: There were no vitals filed for this visit. Wt Readings from Last 3 Encounters:  04/10/20 98.7 kg  04/05/20 96.7 kg  03/20/20 98.9 kg    Labs: Lab Results  Component Value Date   NA 141 04/10/2020   K 3.6 04/10/2020   CL 103 04/10/2020   CO2 29 04/10/2020   GLUCOSE 145 (H) 04/10/2020   BUN 10 04/10/2020   CREATININE 0.98 04/10/2020   CALCIUM 9.1 04/10/2020   Lab Results  Component Value Date   INR 1.1 12/27/2019   Lab Results  Component Value Date   CHOL 99 05/27/2018   HDL 44 05/27/2018   LDLCALC 40 05/27/2018   TRIG 71 05/27/2018    GEN- The patient is well appearing, alert and oriented x 3 today.   HEENT-head normocephalic, atraumatic, sclera clear, conjunctiva pink, hearing intact, trachea midline. Lungs- Clear to ausculation bilaterally, normal work of breathing Heart- Regular rate and rhythm, tachycardia, no murmurs, rubs or gallops  GI- soft, NT, ND, + BS Extremities- no clubbing, cyanosis, or edema MS- no significant deformity or atrophy Skin- no rash or lesion Psych- euthymic mood, full affect Neuro- strength and  sensation are intact   EKG- atypical atrial flutter HR 99, RBBB, QRS 140, QTc 526  Echo 07/27/19 1. Left ventricular ejection fraction, by visual estimation, is 40 to  45%. The left ventricle has mild to moderately decreased function. There  is no left ventricular hypertrophy.  2. Mid and apical anterior septum, apical lateral segment, apical  anterior segment, and apex are hypokinetic.  3. Definity contrast agent was given IV to delineate the left ventricular  endocardial borders.  4. Elevated left atrial pressure.  5. Left ventricular diastolic  parameters are consistent with Grade II  diastolic dysfunction (pseudonormalization).  6. The left ventricle demonstrates regional wall motion abnormalities.  7. Global right ventricle has moderately reduced systolic function.The  right ventricular size is normal. No increase in right ventricular wall  thickness.  8. Left atrial size was mildly dilated.  9. Right atrial size was mildly dilated.  10. The mitral valve is normal in structure. No evidence of mitral valve  regurgitation.  11. The tricuspid valve is normal in structure. Tricuspid valve  regurgitation mild-moderate.  12. The aortic valve is normal in structure. Aortic valve regurgitation is  not visualized.  13. The pulmonic valve was grossly normal. Pulmonic valve regurgitation is  not visualized.  14. Mildly elevated pulmonary artery systolic pressure.  15. The inferior vena cava is dilated in size with >50% respiratory  variability, suggesting right atrial pressure of 8 mmHg.  16. No intracardiac thrombi or masses were visualized.   Epic records reviewed   Assessment and Plan: 1.  Persistent atrial fibrillation/atypical atrial flutter Suspect episode 2/2 to other recent medical issues.  Will plan for dccv once he has been on anticoagulation for 3 weeks uninterrupted. Resumed 04/07/20. Continue Multaq 400 mg BID Continue Xarelto 20 mg daily Continue Toprol 100 mg daily  This patients CHA2DS2-VASc Score and unadjusted Ischemic Stroke Rate (% per year) is equal to 4.8 % stroke rate/year from a score of 4  Above score calculated as 1 point each if present [CHF, HTN, DM, Vascular=MI/PAD/Aortic Plaque, Age if 65-74, or Male] Above score calculated as 2 points each if present [Age > 75, or Stroke/TIA/TE]  2. HTN Stable, no changes today.  3. CAD/ischemic CM S/p CABG 1998. EF 45-50% No anginal symptoms.  4. OSA Patient reports compliance with CPAP therapy. Followed by Dr Claiborne Billings.   Follow up in the AF clinic  one week post DCCV.    [facility] [address] [place], [place] [address] [phone]

## 2020-04-19 NOTE — Progress Notes (Signed)
Primary Care Physician: Luetta Nutting, DO Referring Physician: Dr. Gwenlyn Found EP: Dr. Casandra Doffing [name] is a 74 y.o. male with a h/o  PAF, OSA using cpap, HTN, CAD, s/p bypass, s/p ablation x 2, that is here for f/u, on Multaq, as he has had afib since last week occurring on vacation  while he was at the beach.  He is tolerating fairly well, but notices more nervousness. He tracks his HR on his phone app. He saw Dr. Rayann Heman  last  04/04/15 at which time he was offered repeat ablation,  Multaq, sotalol or tikosyn. He chose multaq and has kept him in rhythm  X almost 3 years. He did require a cardioversion 7 months ago.  Otherwise health has been at his baseline. Continues on xarelto without any missed doses. His qt is of concern usually around 500 ms,(RBBB contributing ), longer today in atrial flutter. He is fatigued out of rhythm and prefers to be in SR. He also prefers to avoid another ablation.   F/u in afib clinic,7/30. Unfortunately, he did not convert with cardioversion, but 3 days later, he returned to [place] , and continues to stay in rhythm. He feels improved. I discussed with Dr. Rayann Heman  prior to [place] as his options are limited. He  thought repeat cardioversion was the best option  for pt.   F/u virtual afib clinic 01/28/19. Pt reports no major issues with afib. Will feel his HR pick up at times but can get it back in rhythm in a matter of minutes with deep breaths. He had a prolonged URI in December, was tested for covid antibodies recently and was negative. He is having to use prn lasix once a week and will lose 3-4 lbs but has gotten into the habit of eating pretzels daily. He is now able to get out and golf and he is happy about that. He is taking covid precautions.  Follow up in the AF clinic 12/14/19. Patient reports he is doing very well since his last visit. He has lost between 15-20 lbs by reducing his portion sizes. He has had two episodes of heart racing which resolved in <2 minutes.  He denies bleeding issues with anticoagulation.   Follow up in the AF clinic 04/19/20. Patient has been diagnosed with early stages of small cell lung cancer. Patient was admitted 04/03/20-04/06/20 for acute anemia requiring transfusion. Anemia appeared to be multifactorial with bone marrow suppression from chemo and GI bleeding. Upper and lower EGD were negative for active bleeding. Xarelto was resumed once platelets were >50,000. He reports that he has felt fatigued since discharge. ECG on 04/10/20 showed he was back out of rhythm.   Today, he denies symptoms of palpitations, chest pain, shortness of breath, orthopnea, PND, lower extremity edema, dizziness, presyncope, syncope, or neurologic sequela. The patient is tolerating medications without difficulties and is otherwise without complaint today.   Past Medical History:  Diagnosis Date  . Atypical atrial flutter ([place])   . Cancer [facility])    Testicular- surgery   . COPD (chronic obstructive pulmonary disease) ([place])   . Coronary artery disease   . Diabetes mellitus without complication ([place])   . Diastolic dysfunction, left ventricle 05/2018   Dr. Rayann Heman- Cardiologist  . Dysrhythmia    chronic AFib  . History of kidney stones    passed  . HLD (hyperlipidemia)   . HTN (hypertension)   . Myocardial infarction ([address])    x 2 maybe 1 more  .  OSA (obstructive sleep apnea)    uses CPAP  . Peripheral artery disease ([place])    pseudoaneurysm post afib ablation at [facility] 2011, s/p bilateral iliac stents  . Persistent atrial fibrillation ([place])   . Pre-diabetes   . Renal artery stenosis ([facility])    right renal artery PTA and stenting  . S/P coronary artery bypass graft x 7 11/16/96   Past Surgical History:  Procedure Laterality Date  . 2-D echocardiogram  03/25/2010   Normal left ventricular function. Mild MR, TR, trivial AR  . ATRIAL FIBRILLATION ABLATION  03/27/2010, 12/31/2010   [facility], Dr. Nadeen Landau  . BACK SURGERY  2004, 2007   Laminectomy, Discedectomy x  2  . BRONCHIAL BIOPSY  12/27/2019   Procedure: BRONCHIAL BIOPSIES;  Surgeon: Garner Nash, DO;  Location: [facility] ENDOSCOPY;  Service: Pulmonary;;  . BRONCHIAL BRUSHINGS  12/27/2019   Procedure: BRONCHIAL BRUSHINGS;  Surgeon: Garner Nash, DO;  Location: [place];  Service: Pulmonary;;  . BRONCHIAL NEEDLE ASPIRATION BIOPSY  12/27/2019   Procedure: BRONCHIAL NEEDLE ASPIRATION BIOPSIES;  Surgeon: Garner Nash, DO;  Location: [facility] ENDOS[facility];  Service: Pulmonary;;  . cardiac stress test  11/21/2009   Exercise capacity 5 METS. No significant ischemia demonstrated  . CARDIOVERSION N/A 03/15/2015   Procedure: CARDIOVERSION;  Surgeon: Lorretta Harp, MD;  Location: [facility] ENDOSCOPY;  Service: Cardiovascular;  Laterality: N/A;  . CARDIOVERSION N/A 07/10/2017   Procedure: CARDIOVERSION;  Surgeon: Pixie Casino, MD;  Location: [facility]COPY;  Service: Cardiovascular;  Laterality: N/A;  . CARDIOVERSION N/A 03/23/2018   Procedure: CARDIOVERSION;  Surgeon: Sanda Klein, MD;  Location: [facility] ENDOSCOPY;  Service: Cardiovascular;  Laterality: N/A;  . COLONOSCOPY WITH PROPOFOL N/A 04/05/2020   Procedure: COLONOSCOPY WITH PROPOFOL;  Surgeon: Carol Ada, MD;  Location: [facility] ENDOSCOPY;  Service: Endoscopy;  Laterality: N/A;  . CORONARY ARTERY BYPASS GRAFT  1998  . ELECTROMAGNETIC NAVIGATION BROCHOSCOPY  12/27/2019   Procedure: NAVIGATION BRONCHOSCOPY;  Surgeon: Garner Nash, DO;  Location: [facility] ENDOSCOPY;  Service: Pulmonary;;  . ESOPHAGOGASTRODUODENOSCOPY (EGD) WITH PROPOFOL N/A 04/05/2020   Procedure: ESOPHAGOGASTRODUODENOSCOPY (EGD) WITH PROPOFOL;  Surgeon: Carol Ada, MD;  Location: [facility] ENDOSCOPY;  Service: Endoscopy;  Laterality: N/A;  . FIDUCIAL MARKER PLACEMENT  12/27/2019   Procedure: FIDUCIAL MARKER PLACEMENT;  Surgeon: Garner Nash, DO;  Location: [facility] ENDOSCOPY;  Service: Pulmonary;;  . HOT HEMOSTASIS N/A 04/05/2020   Procedure: HOT HEMOSTASIS (ARGON PLASMA COAGULATION/BICAP);  Surgeon: Carol Ada, MD;  Location: [facility];  Service: Endoscopy;  Laterality: N/A;  . [place]   for cancer  . POLYPECTOMY  04/05/2020   Procedure: POLYPECTOMY;  Surgeon: Carol Ada, MD;  Location: [facility] ENDOSCOPY;  Service: Endoscopy;;  . TOOTH EXTRACTION  05/27/2013   tooth extraction with bone graft- several -for Implants  . VIDEO BRONCHOSCOPY WITH ENDOBRONCHIAL NAVIGATION N/A 12/27/2019   Procedure: VIDEO BRONCHOSCOPY;  Surgeon: Garner Nash, DO;  Location: [place];  Service: Pulmonary;  Laterality: N/A;    Current Outpatient Medications  Medication Sig Dispense Refill  . acetaminophen (TYLENOL) 500 MG tablet Take 1,000 mg every 6 (six) hours as needed by mouth (for pain.).    [name] atorvastatin (LIPITOR) 10 MG tablet TAKE 1 TABLET(10 MG) BY MOUTH DAILY (Patient taking differently: Take 10 mg by mouth every evening. TAKE 1 TABLET(10 MG) BY MOUTH DAILY) 90 tablet 2  . budesonide-formoterol (SYMBICORT) 160-4.5 MCG/ACT inhaler Inhale 2 puffs into the lungs 2 (two) times daily. 10.2 g 3  . cetirizine (ZYRTEC) 10  MG tablet Take 10 mg by mouth daily as needed for allergies.     [name] dronedarone (MULTAQ) 400 MG tablet TAKE 1 TABLET BY MOUTH TWICE DAILY WITH MEAL (Patient taking differently: Take 400 mg by mouth 2 (two) times daily with a meal. TAKE 1 TABLET BY MOUTH TWICE DAILY WITH MEAL) 180 tablet 1  . empagliflozin (JARDIANCE) 10 MG TABS tablet Take 10 mg by mouth daily. Schedule appt with PCP 90 tablet 2  . furosemide (LASIX) 40 MG tablet TAKE 1 TABLET BY MOUTH AS NEEDED (Patient taking differently: Take 40 mg by mouth daily as needed for fluid. ) 30 tablet 3  . gabapentin (NEURONTIN) 300 MG capsule Take 2 capsules (600 mg total) by mouth at bedtime. 180 capsule 2  . ketotifen (ZADITOR) 0.025 % ophthalmic solution Place 1 drop into both eyes 2 (two) times daily as needed (allergies).    [name] lisinopril-hydrochlorothiazide (ZESTORETIC) 10-12.5 MG tablet TAKE 1 TABLET BY MOUTH DAILY (Patient  taking differently: Take 1 tablet by mouth every evening. ) 90 tablet 1  . metoprolol succinate (TOPROL-XL) 100 MG 24 hr tablet TAKE 125MG  (1 AND 1/4 TAB) DAILY 90 tablet 3  . Multiple Vitamin (MULTIVITAMIN WITH MINERALS) TABS tablet Take 1 tablet by mouth daily. In the morning.    . niacin (NIASPAN) 1000 MG CR tablet TAKE 1 TABLET(1000 MG) BY MOUTH AT BEDTIME (Patient taking differently: Take 1,000 mg by mouth at bedtime. ) 90 tablet 3  . nicotine polacrilex (COMMIT) 2 MG lozenge Take 2 mg by mouth as needed for smoking cessation.    . nitroGLYCERIN (NITROSTAT) 0.4 MG SL tablet Place 1 tablet (0.4 mg total) under the tongue every 5 (five) minutes as needed. 25 tablet 3  . NON FORMULARY CPAP at night    . oxymetazoline (AFRIN) 0.05 % nasal spray Place 1 spray into both nostrils 2 (two) times daily as needed for congestion.    . pantoprazole (PROTONIX) 40 MG tablet Take 1 tablet (40 mg total) by mouth daily. 30 tablet 2  . prochlorperazine (COMPAZINE) 10 MG tablet TAKE 1 TABLET(10 MG) BY MOUTH EVERY 6 HOURS AS NEEDED FOR NAUSEA OR VOMITING (Patient taking differently: Take 10 mg by mouth every 6 (six) hours as needed for nausea or vomiting. ) 30 tablet 0  . traZODone (DESYREL) 50 MG tablet TAKE 1/2 TO 1 TABLET BY MOUTH AT BEDTIME AS NEEDED FOR SLEEP (Patient taking differently: Take 50 mg by mouth at bedtime. ) 90 tablet 3  . VENTOLIN HFA 108 (90 Base) MCG/ACT inhaler INHALE 2 PUFFS INTO THE LUNGS EVERY 6 HOURS AS NEEDED FOR WHEEZING OR SHORTNESS OF BREATH (Patient taking differently: Inhale 2 puffs into the lungs every 6 (six) hours as needed (wheezing/shortness of breath.). ) 18 g 1  . XARELTO 20 MG TABS tablet TAKE 1 TABLET BY MOUTH EVERY DAY WITH SUPPER (Patient taking differently: Take 20 mg by mouth every evening. ) 90 tablet 1   No current facility-administered medications for this visit.    No Known Allergies  Social History   Socioeconomic History  . Marital status: Divorced    Spouse  name: Not on file  . Number of children: 2  . Years of education: 68  . Highest education level: Associate degree: academic program  Occupational History  . 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 Smoking status: Former Smoker    Packs/day: 1.50    Years: 30.00    Pack  years: 45.00    Types: Cigarettes, Cigars    Quit date: 09/01/1994    Years since quitting: 25.6  . Smokeless tobacco: Never Used  . Tobacco comment: cigars quit  - 11/16/2019  Vaping Use  . Vaping Use: Never used  Substance and Sexual Activity  . Alcohol use: Not Currently  . Drug use: No  . Sexual activity: Yes  Other Topics Concern  . Not on file  Social History Narrative   Pt lives in [place] alone.  Divorced. 2 cups of coffee in the morning.   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 Difficulty of Paying Living Expenses: Not on file  Food Insecurity:   . Worried About Charity fundraiser in the Last Year: Not on file  . Ran Out of Food in the Last Year: Not on file  Transportation Needs:   . Lack of Transportation (Medical): Not on file  . Lack of Transportation (Non-Medical): Not on file  Physical Activity:   . Days of Exercise per Week: Not on file  . Minutes of Exercise per Session: Not on file  Stress:   . Feeling of Stress : Not on file  Social Connections:   . Frequency of Communication with Friends and Family: Not on file  . Frequency of Social Gatherings with Friends and Family: Not on file  . Attends Religious Services: Not on file  . Active Member of Clubs or Organizations: Not on file  . Attends Archivist Meetings: Not on file  . Marital Status: Not on file  Intimate Partner Violence:   . Fear of Current or Ex-Partner: Not on file  . Emotionally Abused: Not on file  . Physically Abused: Not on file  . Sexually Abused: Not on file    Family History  Problem Relation Age  of Onset  . Cancer Mother   . Cancer Father   . Other Brother        NO MEDICAL PROBLEMS  . Other Son        NO MEDICAL PROBLEMS  . Leukemia Daughter 6       Recover and has no other problems    ROS- All systems are reviewed and negative except as per the HPI above  Physical Exam: There were no vitals filed for this visit. Wt Readings from Last 3 Encounters:  04/10/20 98.7 kg  04/05/20 96.7 kg  03/20/20 98.9 kg    Labs: Lab Results  Component Value Date   NA 141 04/10/2020   K 3.6 04/10/2020   CL 103 04/10/2020   CO2 29 04/10/2020   GLUCOSE 145 (H) 04/10/2020   BUN 10 04/10/2020   CREATININE 0.98 04/10/2020   CALCIUM 9.1 04/10/2020   Lab Results  Component Value Date   INR 1.1 12/27/2019   Lab Results  Component Value Date   CHOL 99 05/27/2018   HDL 44 05/27/2018   LDLCALC 40 05/27/2018   TRIG 71 05/27/2018    GEN- The patient is well appearing, alert and oriented x 3 today.   HEENT-head normocephalic, atraumatic, sclera clear, conjunctiva pink, hearing intact, trachea midline. Lungs- Clear to ausculation bilaterally, normal work of breathing Heart- Regular rate and rhythm, tachycardia, no murmurs, rubs or gallops  GI- soft, NT, ND, + BS Extremities- no clubbing, cyanosis, or edema MS- no significant deformity or atrophy Skin- no rash or lesion Psych- euthymic mood, full affect Neuro- strength and  sensation are intact   EKG- atypical atrial flutter HR 99, RBBB, QRS 140, QTc 526  Echo 07/27/19 1. Left ventricular ejection fraction, by visual estimation, is 40 to  45%. The left ventricle has mild to moderately decreased function. There  is no left ventricular hypertrophy.  2. Mid and apical anterior septum, apical lateral segment, apical  anterior segment, and apex are hypokinetic.  3. Definity contrast agent was given IV to delineate the left ventricular  endocardial borders.  4. Elevated left atrial pressure.  5. Left ventricular diastolic  parameters are consistent with Grade II  diastolic dysfunction (pseudonormalization).  6. The left ventricle demonstrates regional wall motion abnormalities.  7. Global right ventricle has moderately reduced systolic function.The  right ventricular size is normal. No increase in right ventricular wall  thickness.  8. Left atrial size was mildly dilated.  9. Right atrial size was mildly dilated.  10. The mitral valve is normal in structure. No evidence of mitral valve  regurgitation.  11. The tricuspid valve is normal in structure. Tricuspid valve  regurgitation mild-moderate.  12. The aortic valve is normal in structure. Aortic valve regurgitation is  not visualized.  13. The pulmonic valve was grossly normal. Pulmonic valve regurgitation is  not visualized.  14. Mildly elevated pulmonary artery systolic pressure.  15. The inferior vena cava is dilated in size with >50% respiratory  variability, suggesting right atrial pressure of 8 mmHg.  16. No intracardiac thrombi or masses were visualized.   Epic records reviewed   Assessment and Plan: 1.  Persistent atrial fibrillation/atypical atrial flutter Suspect episode 2/2 to other recent medical issues.  Will plan for dccv once he has been on anticoagulation for 3 weeks uninterrupted. Resumed 04/07/20. Continue Multaq 400 mg BID Continue Xarelto 20 mg daily Continue Toprol 100 mg daily  This patients CHA2DS2-VASc Score and unadjusted Ischemic Stroke Rate (% per year) is equal to 4.8 % stroke rate/year from a score of 4  Above score calculated as 1 point each if present [CHF, HTN, DM, Vascular=MI/PAD/Aortic Plaque, Age if 65-74, or Male] Above score calculated as 2 points each if present [Age > 75, or Stroke/TIA/TE]  2. HTN Stable, no changes today.  3. CAD/ischemic CM S/p CABG 1998. EF 45-50% No anginal symptoms.  4. OSA Patient reports compliance with CPAP therapy. Followed by Dr Claiborne Billings.   Follow up in the AF clinic  one week post DCCV.    [facility] [address] [place], [place] [address] [phone]

## 2020-04-19 NOTE — Patient Instructions (Signed)
Cardioversion scheduled for Tuesday, August 31st  - Arrive at the [facility] and go to admitting at [place] not eat or drink anything after midnight the night prior to your procedure.  - Take all your morning medication (except diabetic medications) with a sip of water prior to arrival.  - You will not be able to drive home after your procedure.  - Do NOT miss any doses of your blood thinner - if you should miss a dose please notify our office immediately.

## 2020-04-20 ENCOUNTER — Other Ambulatory Visit (HOSPITAL_COMMUNITY): Payer: Medicare Other

## 2020-04-21 NOTE — Progress Notes (Signed)
[facility] [facility] OFFICE PROGRESS NOTE  Luetta Nutting, DO [place]  [address] [place] [address]  DIAGNOSIS: Limited stage, stage Ia (T1b, N0, M0) small cell lung cancer presented with right upper lobe pulmonary nodule that is hypermetabolic and biopsy proven to be consistent with small cell carcinoma diagnosed in April 2021.  PRIOR THERAPY: Concurrent chemoradiation with carboplatin for an AUC of 5 on days 1 and etoposide 100 mg per metered squared on days 1, 2, and 3 IV every 3 weeks. Last chemotherapy 03/20/20.Status post 4 cycles. Last dose of SBRT June 2021.   CURRENT THERAPY: Observation  INTERVAL HISTORY: Keshav Winegar Mcmanaman 74 y.o. male returns to the clinic today for a follow-up visit.  The patient is feeling better today without any concerning complaints.  In the interval since completing his last cycle of chemotherapy on 03/20/2020, the patient had received a blood transfusion on 03/27/2020.  On 04/03/2020, the patient returned to the clinic for routine labs which noted hypotension, anemia, and thrombocytopenia.  The patient also reported bright red blood per rectum.  He was subsequently seen in the emergency room.  While admitted to the hospital the patient received 3 units of blood and his Xarelto was held, and the patient was evaluated by gastroenterology.  He had a EGD which was unremarkable.  He also had a colonoscopy which removed some polyps.  Today, the patient is feeling improved, particularly his energy. He was recently evaluated by his cardiologist for his history of atrial fibrillation. They believe his recent anemia triggered him to go into atrial fibrillation/flutter. Cardiology is planning to cardioversion  Next week. The patient has resumed his Xarelto denies any abnormal bleeding or bruising at this time.  Otherwise the patient denies any recent fever, chills, or night sweats. He lost a few pounds but he states that was intentional. He denies  shortness of breath, cough, chest pain, or hemoptysis but endorses very mild chest congestion/post nasal drainage which produces clear sputum.  He denies any vomiting, diarrhea, or constipation. He reports mild nausea which is controlled with his anti-emetic. He states his nausea has been getting better.  Denies any headache or visual changes.  The patient recently had a restaging CT scan performed.  He is here today for evaluation and to review his scan results.   MEDICAL HISTORY: Past Medical History:  Diagnosis Date  . Atypical atrial flutter ([place])   . Cancer [facility])    Testicular- surgery   . COPD (chronic obstructive pulmonary disease) ([place])   . Coronary artery disease   . Diabetes mellitus without complication ([place])   . Diastolic dysfunction, left ventricle 05/2018   Dr. Rayann Heman- Cardiologist  . Dysrhythmia    chronic AFib  . History of kidney stones    passed  . HLD (hyperlipidemia)   . HTN (hypertension)   . Myocardial infarction ([place])    x 2 maybe 1 more  . OSA (obstructive sleep apnea)    uses CPAP  . Peripheral artery disease ([place])    pseudoaneurysm post afib ablation at [facility] 2011, s/p bilateral iliac stents  . Persistent atrial fibrillation ([place])   . Pre-diabetes   . Renal artery stenosis ([facility])    right renal artery PTA and stenting  . S/P coronary artery bypass graft x 7 11/16/96    ALLERGIES:  has No Known Allergies.  MEDICATIONS:  Current Outpatient Medications  Medication Sig Dispense Refill  . acetaminophen (TYLENOL) 500 MG tablet Take 1,000 mg by  mouth as needed (for pain.).     [name] atorvastatin (LIPITOR) 10 MG tablet TAKE 1 TABLET(10 MG) BY MOUTH DAILY (Patient taking differently: Take 10 mg by mouth every evening. TAKE 1 TABLET(10 MG) BY MOUTH DAILY) 90 tablet 2  . budesonide-formoterol (SYMBICORT) 160-4.5 MCG/ACT inhaler Inhale 2 puffs into the lungs 2 (two) times daily. 10.2 g 3  . cetirizine (ZYRTEC) 10 MG tablet Take 10 mg by mouth daily as needed for  allergies.     [name] dronedarone (MULTAQ) 400 MG tablet TAKE 1 TABLET BY MOUTH TWICE DAILY WITH MEAL (Patient taking differently: Take 400 mg by mouth 2 (two) times daily with a meal. ) 180 tablet 1  . empagliflozin (JARDIANCE) 10 MG TABS tablet Take 10 mg by mouth daily. Schedule appt with PCP (Patient not taking: Reported on 04/19/2020) 90 tablet 2  . gabapentin (NEURONTIN) 300 MG capsule Take 2 capsules (600 mg total) by mouth at bedtime. 180 capsule 2  . ketotifen (ZADITOR) 0.025 % ophthalmic solution Place 1 drop into both eyes 2 (two) times daily as needed (allergies).    [name] lisinopril-hydrochlorothiazide (ZESTORETIC) 10-12.5 MG tablet TAKE 1 TABLET BY MOUTH DAILY (Patient taking differently: Take 1 tablet by mouth every evening. ) 90 tablet 1  . metoprolol succinate (TOPROL-XL) 100 MG 24 hr tablet TAKE 125MG  (1 AND 1/4 TAB) DAILY 90 tablet 3  . Multiple Vitamin (MULTIVITAMIN WITH MINERALS) TABS tablet Take 1 tablet by mouth daily. In the morning.    . niacin (NIASPAN) 1000 MG CR tablet TAKE 1 TABLET(1000 MG) BY MOUTH AT BEDTIME (Patient taking differently: Take 1,000 mg by mouth at bedtime. ) 90 tablet 3  . nicotine polacrilex (COMMIT) 2 MG lozenge Take 2 mg by mouth as needed for smoking cessation.    . nitroGLYCERIN (NITROSTAT) 0.4 MG SL tablet Place 1 tablet (0.4 mg total) under the tongue every 5 (five) minutes as needed. 25 tablet 3  . NON FORMULARY CPAP at night    . oxymetazoline (AFRIN) 0.05 % nasal spray Place 1 spray into both nostrils as needed for congestion.     . pantoprazole (PROTONIX) 40 MG tablet Take 1 tablet (40 mg total) by mouth daily. 30 tablet 2  . prochlorperazine (COMPAZINE) 10 MG tablet TAKE 1 TABLET(10 MG) BY MOUTH EVERY 6 HOURS AS NEEDED FOR NAUSEA OR VOMITING (Patient taking differently: Take 10 mg by mouth every 6 (six) hours as needed for nausea or vomiting. ) 30 tablet 0  . traZODone (DESYREL) 50 MG tablet TAKE 1/2 TO 1 TABLET BY MOUTH AT BEDTIME AS NEEDED FOR SLEEP 90  tablet 3  . VENTOLIN HFA 108 (90 Base) MCG/ACT inhaler INHALE 2 PUFFS INTO THE LUNGS EVERY 6 HOURS AS NEEDED FOR WHEEZING OR SHORTNESS OF BREATH (Patient taking differently: Inhale 2 puffs into the lungs every 6 (six) hours as needed (wheezing/shortness of breath.). ) 18 g 1  . XARELTO 20 MG TABS tablet TAKE 1 TABLET BY MOUTH EVERY DAY WITH SUPPER (Patient taking differently: Take 20 mg by mouth every evening. ) 90 tablet 1   No current facility-administered medications for this visit.    SURGICAL HISTORY:  Past Surgical History:  Procedure Laterality Date  . 2-D echocardiogram  03/25/2010   Normal left ventricular function. Mild MR, TR, trivial AR  . ATRIAL FIBRILLATION ABLATION  03/27/2010, 12/31/2010   [facility], Dr. Nadeen Landau  . BACK SURGERY  2004, 2007   Laminectomy, Discedectomy x 2  . BRONCHIAL BIOPSY  12/27/2019   Procedure:  BRONCHIAL BIOPSIES;  Surgeon: Garner Nash, DO;  Location: [facility] ENDOSCOPY;  Service: Pulmonary;;  . BRONCHIAL BRUSHINGS  12/27/2019   Procedure: BRONCHIAL BRUSHINGS;  Surgeon: Garner Nash, DO;  Location: [facility];  Service: Pulmonary;;  . BRONCHIAL NEEDLE ASPIRATION BIOPSY  12/27/2019   Procedure: BRONCHIAL NEEDLE ASPIRATION BIOPSIES;  Surgeon: Garner Nash, DO;  Location: [facility] ENDOSCOPY;  Service: Pulmonary;;  . cardiac stress test  11/21/2009   Exercise capacity 5 METS. No significant ischemia demonstrated  . CARDIOVERSION N/A 03/15/2015   Procedure: CARDIOVERSION;  Surgeon: Lorretta Harp, MD;  Location: [facility] ENDOSCOPY;  Service: Cardiovascular;  Laterality: N/A;  . CARDIOVERSION N/A 07/10/2017   Procedure: CARDIOVERSION;  Surgeon: Pixie Casino, MD;  Location: [facility] ENDOSCOPY;  Service: Cardiovascular;  Laterality: N/A;  . CARDIOVERSION N/A 03/23/2018   Procedure: CARDIOVERSION;  Surgeon: Sanda Klein, MD;  Location: [facility] ENDOSCOPY;  Service: Cardiovascular;  Laterality: N/A;  . COLONOSCOPY WITH PROPOFOL N/A 04/05/2020   Procedure: COLONOSCOPY WITH  PROPOFOL;  Surgeon: Carol Ada, MD;  Location: [facility] ENDOSCOPY;  Service: Endoscopy;  Laterality: N/A;  . CORONARY ARTERY BYPASS GRAFT  1998  . ELECTROMAGNETIC NAVIGATION BROCHOSCOPY  12/27/2019   Procedure: NAVIGATION BRONCHOSCOPY;  Surgeon: Garner Nash, DO;  Location: [facility] ENDOSCOPY;  Service: Pulmonary;;  . ESOPHAGOGASTRODUODENOSCOPY (EGD) WITH PROPOFOL N/A 04/05/2020   Procedure: ESOPHAGOGASTRODUODENOSCOPY (EGD) WITH PROPOFOL;  Surgeon: Carol Ada, MD;  Location: [facility] ENDOSCOPY;  Service: Endoscopy;  Laterality: N/A;  . FIDUCIAL MARKER PLACEMENT  12/27/2019   Procedure: FIDUCIAL MARKER PLACEMENT;  Surgeon: Garner Nash, DO;  Location: [facility] [place] [facility] ENDOSCOPY;  Service: Pulmonary;;  . HOT HEMOSTASIS N/A 04/05/2020   Procedure: HOT HEMOSTASIS (ARGON PLASMA COAGULATION/BICAP);  Surgeon: Carol Ada, MD;  Location: [facility];  Service: Endoscopy;  Laterality: N/A;  . [place]   for cancer  . POLYPECTOMY  04/05/2020   Procedure: POLYPECTOMY;  Surgeon: Carol Ada, MD;  Location: [facility] ENDOSCOPY;  Service: Endoscopy;;  . TOOTH EXTRACTION  05/27/2013   tooth extraction with bone graft- several -for Implants  . VIDEO BRONCHOSCOPY WITH ENDOBRONCHIAL NAVIGATION N/A 12/27/2019   Procedure: VIDEO BRONCHOSCOPY;  Surgeon: Garner Nash, DO;  Location: [facility];  Service: Pulmonary;  Laterality: N/A;    REVIEW OF SYSTEMS:   Review of Systems  Constitutional: Negative for appetite change, chills, fatigue, fever and unexpected weight change.  HENT: Negative for mouth sores, nosebleeds, sore throat and trouble swallowing.   Eyes: Negative for eye problems and icterus.  Respiratory: Negative for cough, hemoptysis, shortness of breath and wheezing.   Cardiovascular: Negative for chest pain and leg swelling.  Gastrointestinal: Positive for mild nausea (improving). Negative for abdominal pain, constipation, diarrhea, and vomiting.  Genitourinary: Negative for bladder incontinence, difficulty  urinating, dysuria, frequency and hematuria.   Musculoskeletal: Negative for back pain, gait problem, neck pain and neck stiffness.  Skin: Negative for itching and rash.  Neurological: Negative for dizziness, extremity weakness, gait problem, headaches, light-headedness and seizures.  Hematological: Negative for adenopathy. Does not bruise/bleed easily.  Psychiatric/Behavioral: Negative for confusion, depression and sleep disturbance. The patient is not nervous/anxious.     PHYSICAL EXAMINATION:  Blood pressure (!) 95/55, pulse 98, temperature 98.1 F (36.7 C), temperature source Oral, resp. rate 18, height 6' (1.829 m), weight 211 lb 3.2 oz (95.8 kg), SpO2 99 %.  ECOG PERFORMANCE STATUS: 1 - Symptomatic but completely ambulatory  Physical Exam  Constitutional: Oriented to person, place, and time and well-developed, well-nourished, and in no distress.  HENT:  Head: Normocephalic and atraumatic.  Mouth/Throat: Oropharynx is clear and moist. No oropharyngeal exudate.  Eyes: Conjunctivae are normal. Right eye exhibits no discharge. Left eye exhibits no discharge. No scleral icterus.  Neck: Normal range of motion. Neck supple.  Cardiovascular: Normal rate, regular rhythm, normal heart sounds and intact distal pulses.   Pulmonary/Chest: Effort normal and breath sounds normal. No respiratory distress. No wheezes. No rales.  Abdominal: Soft. Bowel sounds are normal. Exhibits no distension and no mass. There is no tenderness.  Musculoskeletal: Normal range of motion. Exhibits no edema.  Lymphadenopathy:    No cervical adenopathy.  Neurological: Alert and oriented to person, place, and time. Exhibits normal muscle tone. Gait normal. Coordination normal.  Skin: Skin is warm and dry. No rash noted. Not diaphoretic. No erythema. No pallor.  Psychiatric: Mood, memory and judgment normal.  Vitals reviewed.  LABORATORY DATA: Lab Results  Component Value Date   WBC 5.2 04/23/2020   HGB 9.8 (L)  04/23/2020   HCT 31.2 (L) 04/23/2020   MCV 105.4 (H) 04/23/2020   PLT 176 04/23/2020      Chemistry      Component Value Date/Time   NA 140 04/23/2020 1034   NA 142 04/18/2010 0000   K 3.9 04/23/2020 1034   CL 103 04/23/2020 1034   CO2 28 04/23/2020 1034   BUN 12 04/23/2020 1034   CREATININE 1.07 04/23/2020 1034   CREATININE 1.41 (H) 09/14/2019 0943      Component Value Date/Time   CALCIUM 9.8 04/23/2020 1034   ALKPHOS 84 04/23/2020 1034   AST 14 (L) 04/23/2020 1034   ALT <6 04/23/2020 1034   BILITOT 0.5 04/23/2020 1034       RADIOGRAPHIC STUDIES:  CT Chest W Contrast  Result Date: 04/18/2020 CLINICAL DATA:  Primary Cancer Type: Lung Imaging Indication: Assess response to therapy Interval therapy since last imaging? Yes Initial Cancer Diagnosis Date: 12/27/2019; Established by: Biopsy-proven Detailed Pathology: Stage IA small cell lung cancer. Primary Tumor location: Right upper lobe. Surgeries: CABG 1998. Chemotherapy: Yes; Ongoing? No; Most recent administration: 03/20/2020 Immunotherapy? No Radiation therapy? Yes; Date Range: 01/23/2020 - 02/01/2020; Target: Right lung EXAM: CT CHEST WITH CONTRAST TECHNIQUE: Multidetector CT imaging of the chest was performed during intravenous contrast administration. CONTRAST:  76mL OMNIPAQUE IOHEXOL 300 MG/ML  SOLN COMPARISON:  Most recent CT chest 12/19/2019.  11/28/2019 PET-CT. FINDINGS: Cardiovascular: Normal heart size. No significant pericardial effusion/thickening. Three-vessel coronary atherosclerosis status post CABG. Atherosclerotic nonaneurysmal thoracic aorta. Normal caliber pulmonary arteries. No central pulmonary emboli. Mediastinum/Nodes: No discrete thyroid nodules. Unremarkable esophagus. No pathologically enlarged axillary, mediastinal or hilar lymph nodes. Lungs/Pleura: No pneumothorax. Trace dependent right pleural effusion is new. No left pleural effusion. Mild centrilobular and paraseptal emphysema with mild diffuse  bronchial wall thickening. History of right upper lobe 0.6 cm solid pulmonary nodule (series 5/image 50), decreased from 1.3 cm. Peripheral right middle lobe 0.3 cm solid pulmonary nodule (series 5/image 97), stable. No acute consolidative airspace disease or new significant pulmonary nodules. Upper abdomen: Liver surface is finely irregular, cannot exclude cirrhosis. Musculoskeletal: No aggressive appearing focal osseous lesions. Stable scattered sternotomy wire discontinuities. Marked thoracic spondylosis. IMPRESSION: 1. Right upper lobe solid pulmonary nodule is significantly decreased, compatible with response to therapy. 2. No findings of metastatic disease in the chest. 3. New trace dependent right pleural effusion. 4. Finely irregular liver surface, cannot exclude cirrhosis. 5. Aortic Atherosclerosis (ICD10-I70.0) and Emphysema (ICD10-J43.9). Electronically Signed   By: Ilona Sorrel M.D.   On: 04/18/2020  11:33     ASSESSMENT/PLAN:  This is a very pleasant 74 year old Caucasian male diagnosed with limited stage (T1b, N0, M0)non-small cell lung cancer. He presented with a right upper lobe pulmonary nodule that was hypermetabolic on PET scan. It was biopsied and proven to be consistent with small cell lung cancer. He was diagnosed in April 2021.  The patient recently completed concurrent chemoradiation with carboplatin for an AUC of 5 on day 1 and etoposide 100 mg per metered squared on days 1, 2, and 3 IV every 3 weeks.The patient was not a good candidate for cisplatin due to his renal insufficiency and other comorbidities. The patient is status post 4 cycles Last dose 03/20/20.   He completed SBRT in June 2021 under the care of Dr. Lisbeth Renshaw.   The patient recently had a restaging CT scan performed.  Dr. Julien Nordmann personally and independently reviewed the scan discussed the results with the patient.  The scan showed positive response to treatment.  Dr. Julien Nordmann recommends that the patient continue  on observation with a restaging CT scan in 4 months.  Dr. Julien Nordmann discussed PCI with the patient. He is interested in talking to radiation oncology to begin initiating this process. He did request to meet with radiation in two weeks due to his upcoming cardioversion with cardiology.   He was instructed to take iron supplements for his anemia.   The patient was advised to call immediately if he has any concerning symptoms in the interval. The patient voices understanding of current disease status and treatment options and is in agreement with the current care plan. All questions were answered. The patient knows to call the clinic with any problems, questions or concerns. We can certainly see the patient much sooner if necessary       Orders Placed This Encounter  Procedures  . CT Chest W Contrast    Standing Status:   Future    Standing Expiration Date:   04/23/2021    Order Specific Question:   If indicated for the ordered procedure, I authorize the administration of contrast media per Radiology protocol    Answer:   Yes    Order Specific Question:   Preferred imaging location?    Answer:   [facility]    Order Specific Question:   Radiology Contrast Protocol - do NOT remove file path    Answer:   [email or web address]  . CBC with Differential ([place] Only)    Standing Status:   Future    Standing Expiration Date:   04/23/2021  . CMP ([facility] only)    Standing Status:   Future    Standing Expiration Date:   04/23/2021     Tobe Sos Martine Bleecker, PA-C 04/23/20  ADDENDUM: Hematology/Oncology Attending: I had a face-to-face encounter with the patient today.  I recommended his care plan.  This is a very pleasant 74 years old white male diagnosed with a stage limited stage (T1b, N0, M0) small cell lung cancer presented with right upper lobe pulmonary nodule status post SBRT as well as 4 cycles of systemic chemotherapy with carboplatin and  to etoposide.  The patient tolerated his chemotherapy fairly well except for significant chemotherapy-induced anemia requiring PRBCs transfusion. He was admitted to the hospital recently with the anemia and feeling much better now. He had repeat CT scan of the chest performed recently.  I personally and independently reviewed the scans and discussed the results with the patient today. His scan showed significant improvement of his disease.  I recommended for the patient to continue on observation with repeat CT scan of the chest in around 4 months. I also discussed with the patient the role of prophylactic cranial irradiation he would like to talk to Dr. Lisbeth Renshaw about this option. He was advised to call immediately if he has any concerning symptoms in the interval.  Disclaimer: This note was dictated with voice recognition software. Similar sounding words can inadvertently be transcribed and may be missed upon review. Eilleen Kempf, MD 04/23/20

## 2020-04-23 ENCOUNTER — Inpatient Hospital Stay: Payer: Medicare Other

## 2020-04-23 ENCOUNTER — Other Ambulatory Visit: Payer: Self-pay

## 2020-04-23 ENCOUNTER — Encounter: Payer: Self-pay | Admitting: Physician Assistant

## 2020-04-23 ENCOUNTER — Inpatient Hospital Stay (HOSPITAL_BASED_OUTPATIENT_CLINIC_OR_DEPARTMENT_OTHER): Payer: Medicare Other | Admitting: Physician Assistant

## 2020-04-23 VITALS — BP 95/55 | HR 98 | Temp 98.1°F | Resp 18 | Ht 72.0 in | Wt 211.2 lb

## 2020-04-23 DIAGNOSIS — C3411 Malignant neoplasm of upper lobe, right bronchus or lung: Secondary | ICD-10-CM

## 2020-04-23 DIAGNOSIS — T451X5A Adverse effect of antineoplastic and immunosuppressive drugs, initial encounter: Secondary | ICD-10-CM | POA: Diagnosis not present

## 2020-04-23 DIAGNOSIS — D6481 Anemia due to antineoplastic chemotherapy: Secondary | ICD-10-CM | POA: Diagnosis not present

## 2020-04-23 DIAGNOSIS — C3491 Malignant neoplasm of unspecified part of right bronchus or lung: Secondary | ICD-10-CM

## 2020-04-23 LAB — CBC WITH DIFFERENTIAL (CANCER CENTER ONLY)
Abs Immature Granulocytes: 0.01 10*3/uL (ref 0.00–0.07)
Basophils Absolute: 0.1 10*3/uL (ref 0.0–0.1)
Basophils Relative: 1 %
Eosinophils Absolute: 0.2 10*3/uL (ref 0.0–0.5)
Eosinophils Relative: 4 %
HCT: 31.2 % — ABNORMAL LOW (ref 39.0–52.0)
Hemoglobin: 9.8 g/dL — ABNORMAL LOW (ref 13.0–17.0)
Immature Granulocytes: 0 %
Lymphocytes Relative: 23 %
Lymphs Abs: 1.2 10*3/uL (ref 0.7–4.0)
MCH: 33.1 pg (ref 26.0–34.0)
MCHC: 31.4 g/dL (ref 30.0–36.0)
MCV: 105.4 fL — ABNORMAL HIGH (ref 80.0–100.0)
Monocytes Absolute: 0.7 10*3/uL (ref 0.1–1.0)
Monocytes Relative: 13 %
Neutro Abs: 3.1 10*3/uL (ref 1.7–7.7)
Neutrophils Relative %: 59 %
Platelet Count: 176 10*3/uL (ref 150–400)
RBC: 2.96 MIL/uL — ABNORMAL LOW (ref 4.22–5.81)
RDW: 21.3 % — ABNORMAL HIGH (ref 11.5–15.5)
WBC Count: 5.2 10*3/uL (ref 4.0–10.5)
nRBC: 0 % (ref 0.0–0.2)

## 2020-04-23 LAB — CMP (CANCER CENTER ONLY)
ALT: <6 U/L (ref 0–44)
AST: 14 U/L — ABNORMAL LOW (ref 15–41)
Albumin: 3.4 g/dL — ABNORMAL LOW (ref 3.5–5.0)
Alkaline Phosphatase: 84 U/L (ref 38–126)
Anion gap: 9 (ref 5–15)
BUN: 12 mg/dL (ref 8–23)
CO2: 28 mmol/L (ref 22–32)
Calcium: 9.8 mg/dL (ref 8.9–10.3)
Chloride: 103 mmol/L (ref 98–111)
Creatinine: 1.07 mg/dL (ref 0.61–1.24)
GFR, Est AFR Am: >60 mL/min (ref >60)
GFR, Estimated: >60 mL/min (ref >60)
Glucose, Bld: 143 mg/dL — ABNORMAL HIGH (ref 70–99)
Potassium: 3.9 mmol/L (ref 3.5–5.1)
Sodium: 140 mmol/L (ref 135–145)
Total Bilirubin: 0.5 mg/dL (ref 0.3–1.2)
Total Protein: 6.4 g/dL — ABNORMAL LOW (ref 6.5–8.1)

## 2020-04-23 LAB — SAMPLE TO BLOOD BANK

## 2020-04-24 ENCOUNTER — Other Ambulatory Visit: Payer: Self-pay

## 2020-04-24 ENCOUNTER — Telehealth: Payer: Self-pay | Admitting: Radiation Oncology

## 2020-04-24 ENCOUNTER — Telehealth: Payer: Self-pay | Admitting: Physician Assistant

## 2020-04-24 ENCOUNTER — Ambulatory Visit (HOSPITAL_COMMUNITY): Payer: Medicare Other | Attending: Cardiology

## 2020-04-24 DIAGNOSIS — I739 Peripheral vascular disease, unspecified: Secondary | ICD-10-CM | POA: Diagnosis not present

## 2020-04-24 DIAGNOSIS — I4819 Other persistent atrial fibrillation: Secondary | ICD-10-CM

## 2020-04-24 DIAGNOSIS — I519 Heart disease, unspecified: Secondary | ICD-10-CM

## 2020-04-24 DIAGNOSIS — C3490 Malignant neoplasm of unspecified part of unspecified bronchus or lung: Secondary | ICD-10-CM

## 2020-04-24 LAB — ECHOCARDIOGRAM COMPLETE
Area-P 1/2: 3.1 cm2
S' Lateral: 4.4 cm

## 2020-04-24 MED ORDER — MEMANTINE HCL 5 MG PO TABS: ORAL_TABLET | ORAL | 5 refills | Status: DC

## 2020-04-24 MED ORDER — PERFLUTREN LIPID MICROSPHERE
1.0000 mL | INTRAVENOUS | Status: AC | PRN
  Administered 2020-04-24: 1 mL via INTRAVENOUS

## 2020-04-24 MED ORDER — MEMANTINE HCL 5 MG PO TABS: ORAL_TABLET | ORAL | 0 refills | Status: DC

## 2020-04-24 NOTE — Telephone Encounter (Signed)
Scheduled per los. Called and left msg. Mailed printout  °

## 2020-04-24 NOTE — Telephone Encounter (Signed)
I called and spoke with the patient after medical oncology let us know that he is at a point where we could proceed with prophylactic cranial irradiation given his diagnosis of small cell carcinoma of the lung. I let the patient know we would need another MRI of the brain in order to proceed. He is in agreement and is having a cardioversion for his A. Fib next week. We also discussed short and long term effects of whole brain radiotherapy including cognitive deficits particularly related to task oriented activities. He is in agreement with taking namenda per NCCN protocol to try to reduce this risk. A new rx was sent to his pharmacy as well.

## 2020-04-24 NOTE — Telephone Encounter (Signed)
Great - thanks

## 2020-04-25 ENCOUNTER — Other Ambulatory Visit: Payer: Medicare Other

## 2020-04-25 ENCOUNTER — Telehealth: Payer: Self-pay | Admitting: *Deleted

## 2020-04-25 ENCOUNTER — Ambulatory Visit: Payer: Medicare Other | Admitting: Physician Assistant

## 2020-04-25 NOTE — Telephone Encounter (Signed)
CALLED PATIENT TO INFORM OF MRI FOR 05-02-20 - ARRIVAL TIME- 12;30 PM @ [facility], NO RESTRICTIONS TO TEST, SPOKE WITH PATIENT AND HE IS AWARE OF THIS TEST

## 2020-04-26 ENCOUNTER — Telehealth: Payer: Self-pay | Admitting: *Deleted

## 2020-04-26 ENCOUNTER — Telehealth: Payer: Self-pay | Admitting: Radiation Oncology

## 2020-04-26 NOTE — Telephone Encounter (Signed)
I left a message for the patient to schedule simulation around the time of his MRI on 9/7 and gave him contact information if we need to reschedule.

## 2020-04-26 NOTE — Telephone Encounter (Signed)
Called patient to inform of MRI being moved to 05-08-20 - arrival time- 9:30 am @ [facility], no restrictions to test, spoke with patient and he is aware of this test

## 2020-04-27 ENCOUNTER — Other Ambulatory Visit (HOSPITAL_COMMUNITY)
Admission: RE | Admit: 2020-04-27 | Discharge: 2020-04-27 | Disposition: A | Discharge status: Home / Self Care | Payer: Medicare Other | Source: Ambulatory Visit | Attending: Cardiovascular Disease | Admitting: Cardiovascular Disease

## 2020-04-27 DIAGNOSIS — Z01812 Encounter for preprocedural laboratory examination: Secondary | ICD-10-CM | POA: Insufficient documentation

## 2020-04-27 DIAGNOSIS — Z20822 Contact with and (suspected) exposure to covid-19: Secondary | ICD-10-CM | POA: Insufficient documentation

## 2020-04-27 LAB — SARS CORONAVIRUS 2 (TAT 6-24 HRS): SARS Coronavirus 2: NEGATIVE

## 2020-04-30 NOTE — Progress Notes (Signed)
Pre call done

## 2020-05-01 ENCOUNTER — Ambulatory Visit (HOSPITAL_COMMUNITY): Payer: Medicare Other | Admitting: Certified Registered Nurse Anesthetist

## 2020-05-01 ENCOUNTER — Other Ambulatory Visit: Payer: Self-pay

## 2020-05-01 ENCOUNTER — Ambulatory Visit (HOSPITAL_COMMUNITY)
Admission: RE | Admit: 2020-05-01 | Discharge: 2020-05-01 | Disposition: A | Discharge status: Home / Self Care | Payer: Medicare Other | Attending: Cardiovascular Disease | Admitting: Cardiovascular Disease

## 2020-05-01 ENCOUNTER — Encounter (HOSPITAL_COMMUNITY)
Admission: RE | Disposition: A | Discharge status: Home / Self Care | Payer: Medicare Other | Source: Home / Self Care | Attending: Cardiovascular Disease

## 2020-05-01 ENCOUNTER — Encounter (HOSPITAL_COMMUNITY): Payer: Self-pay | Admitting: Cardiovascular Disease

## 2020-05-01 DIAGNOSIS — Z951 Presence of aortocoronary bypass graft: Secondary | ICD-10-CM | POA: Insufficient documentation

## 2020-05-01 DIAGNOSIS — I4892 Unspecified atrial flutter: Secondary | ICD-10-CM

## 2020-05-01 DIAGNOSIS — I4819 Other persistent atrial fibrillation: Secondary | ICD-10-CM | POA: Insufficient documentation

## 2020-05-01 DIAGNOSIS — J449 Chronic obstructive pulmonary disease, unspecified: Secondary | ICD-10-CM | POA: Insufficient documentation

## 2020-05-01 DIAGNOSIS — E1151 Type 2 diabetes mellitus with diabetic peripheral angiopathy without gangrene: Secondary | ICD-10-CM | POA: Diagnosis not present

## 2020-05-01 DIAGNOSIS — I2510 Atherosclerotic heart disease of native coronary artery without angina pectoris: Secondary | ICD-10-CM | POA: Diagnosis not present

## 2020-05-01 DIAGNOSIS — Z7984 Long term (current) use of oral hypoglycemic drugs: Secondary | ICD-10-CM | POA: Diagnosis not present

## 2020-05-01 DIAGNOSIS — I10 Essential (primary) hypertension: Secondary | ICD-10-CM | POA: Diagnosis not present

## 2020-05-01 DIAGNOSIS — Z87891 Personal history of nicotine dependence: Secondary | ICD-10-CM | POA: Insufficient documentation

## 2020-05-01 DIAGNOSIS — I509 Heart failure, unspecified: Secondary | ICD-10-CM | POA: Insufficient documentation

## 2020-05-01 DIAGNOSIS — Z7901 Long term (current) use of anticoagulants: Secondary | ICD-10-CM | POA: Insufficient documentation

## 2020-05-01 DIAGNOSIS — I252 Old myocardial infarction: Secondary | ICD-10-CM | POA: Diagnosis not present

## 2020-05-01 DIAGNOSIS — E1169 Type 2 diabetes mellitus with other specified complication: Secondary | ICD-10-CM | POA: Diagnosis not present

## 2020-05-01 DIAGNOSIS — Z79899 Other long term (current) drug therapy: Secondary | ICD-10-CM | POA: Insufficient documentation

## 2020-05-01 DIAGNOSIS — E785 Hyperlipidemia, unspecified: Secondary | ICD-10-CM | POA: Diagnosis not present

## 2020-05-01 DIAGNOSIS — G4733 Obstructive sleep apnea (adult) (pediatric): Secondary | ICD-10-CM | POA: Insufficient documentation

## 2020-05-01 DIAGNOSIS — I484 Atypical atrial flutter: Secondary | ICD-10-CM | POA: Insufficient documentation

## 2020-05-01 DIAGNOSIS — I255 Ischemic cardiomyopathy: Secondary | ICD-10-CM | POA: Diagnosis not present

## 2020-05-01 DIAGNOSIS — Z7951 Long term (current) use of inhaled steroids: Secondary | ICD-10-CM | POA: Insufficient documentation

## 2020-05-01 DIAGNOSIS — I480 Paroxysmal atrial fibrillation: Secondary | ICD-10-CM | POA: Diagnosis not present

## 2020-05-01 DIAGNOSIS — I110 Hypertensive heart disease with heart failure: Secondary | ICD-10-CM | POA: Diagnosis not present

## 2020-05-01 HISTORY — PX: CARDIOVERSION: SHX1299

## 2020-05-01 SURGERY — CARDIOVERSION
Anesthesia: General

## 2020-05-01 MED ORDER — SODIUM CHLORIDE 0.9 % IV SOLN: INTRAVENOUS | Status: DC | PRN

## 2020-05-01 MED ORDER — LIDOCAINE HCL (CARDIAC) PF 100 MG/5ML IV SOSY
PREFILLED_SYRINGE | INTRAVENOUS | Status: DC | PRN
  Administered 2020-05-01: 100 mg via INTRAVENOUS

## 2020-05-01 MED ORDER — PHENYLEPHRINE HCL (PRESSORS) 10 MG/ML IV SOLN
INTRAVENOUS | Status: DC | PRN
  Administered 2020-05-01: 80 ug via INTRAVENOUS

## 2020-05-01 NOTE — Interval H&P Note (Signed)
History and Physical Interval Note:  05/01/2020 9:08 AM  [name]  has presented today for surgery, with the diagnosis of AFIB.  The various methods of treatment have been discussed with the patient and family. After consideration of risks, benefits and other options for treatment, the patient has consented to  Procedure(s): CARDIOVERSION (N/A) as a surgical intervention.  The patient's history has been reviewed, patient examined, no change in status, stable for surgery.  I have reviewed the patient's chart and labs.  Questions were answered to the patient's satisfaction.     Jenkins Rouge

## 2020-05-01 NOTE — Anesthesia Preprocedure Evaluation (Signed)
Anesthesia Evaluation  Patient identified by MRN, date of birth, ID band Patient awake    Reviewed: Allergy & Precautions, NPO status , Patient's Chart, lab work & pertinent test results  Airway Mallampati: I  TM Distance: >3 FB Neck ROM: Full    Dental   Pulmonary sleep apnea , former smoker,    Pulmonary exam normal        Cardiovascular hypertension, Pt. on medications + CAD, + Past MI and + CABG  Normal cardiovascular exam     Neuro/Psych    GI/Hepatic   Endo/Other  diabetes, Type 2  Renal/GU      Musculoskeletal   Abdominal   Peds  Hematology   Anesthesia Other Findings   Reproductive/Obstetrics                             Anesthesia Physical Anesthesia Plan  ASA: III  Anesthesia Plan: General   Post-op Pain Management:    Induction: Intravenous  PONV Risk Score and Plan: 2  Airway Management Planned: Mask  Additional Equipment:   Intra-op Plan:   Post-operative Plan:   Informed Consent: I have reviewed the patients History and Physical, chart, labs and discussed the procedure including the risks, benefits and alternatives for the proposed anesthesia with the patient or authorized representative who has indicated his/her understanding and acceptance.       Plan Discussed with: CRNA and Surgeon  Anesthesia Plan Comments:         Anesthesia Quick Evaluation

## 2020-05-01 NOTE — CV Procedure (Signed)
[facility]: Anesthesia:  Dr Conrad Claypool Propofol  [place] x 3 150J then 200J x 2 with manual AP pressure  Converted from atypical flutter to NSR with PAC Latter confirmed with ECG  On Rx xarelto with no missed doses No immediate neurologic sequelae  Jenkins Rouge MD Leonidas Romberg

## 2020-05-01 NOTE — Anesthesia Postprocedure Evaluation (Signed)
Anesthesia Post Note  Patient: [name]  Procedure(s) Performed: CARDIOVERSION (N/A )     Patient location during evaluation: PACU Anesthesia Type: General Level of consciousness: awake and alert Pain management: pain level controlled Vital Signs Assessment: post-procedure vital signs reviewed and stable Respiratory status: spontaneous breathing, nonlabored ventilation, respiratory function stable and patient connected to nasal cannula oxygen Cardiovascular status: blood pressure returned to baseline and stable Postop Assessment: no apparent nausea or vomiting Anesthetic complications: no   No complications documented.  Last Vitals:  Vitals:   05/01/20 1030 05/01/20 1040  BP: (!) 90/48 (!) 100/49  Pulse: 73 75  Resp: 10 13  Temp:    SpO2: 100% 97%    Last Pain:  Vitals:   05/01/20 1040  TempSrc:   PainSc: 0-No pain                 Gusta Marksberry DAVID

## 2020-05-01 NOTE — Transfer of Care (Signed)
Immediate Anesthesia Transfer of Care Note  Patient: [name]  Procedure(s) Performed: CARDIOVERSION (N/A )  Patient Location: PACU and Endoscopy Unit  Anesthesia Type:General  Level of Consciousness: awake, alert  and oriented  Airway & Oxygen Therapy: Patient Spontanous Breathing and Patient connected to nasal cannula oxygen  Post-op Assessment: Report given to RN, Post -op Vital signs reviewed and stable and Patient moving all extremities  Post vital signs: Reviewed and stable  Last Vitals:  Vitals Value Taken Time  BP 90/50 05/01/20 1017  Temp    Pulse 76 05/01/20 1019  Resp 19 05/01/20 1019  SpO2 100 % 05/01/20 1019    Last Pain:  Vitals:   05/01/20 0926  PainSc: 0-No pain         Complications: No complications documented.

## 2020-05-02 ENCOUNTER — Ambulatory Visit (HOSPITAL_COMMUNITY): Payer: Medicare Other

## 2020-05-08 ENCOUNTER — Ambulatory Visit (HOSPITAL_COMMUNITY)
Admission: RE | Admit: 2020-05-08 | Discharge: 2020-05-08 | Disposition: A | Discharge status: Home / Self Care | Payer: Medicare Other | Source: Ambulatory Visit | Attending: Radiation Oncology | Admitting: Radiation Oncology

## 2020-05-08 ENCOUNTER — Ambulatory Visit
Admission: RE | Admit: 2020-05-08 | Discharge: 2020-05-08 | Disposition: A | Discharge status: Home / Self Care | Payer: Medicare Other | Source: Ambulatory Visit | Attending: Radiation Oncology | Admitting: Radiation Oncology

## 2020-05-08 ENCOUNTER — Other Ambulatory Visit: Payer: Self-pay

## 2020-05-08 DIAGNOSIS — C3411 Malignant neoplasm of upper lobe, right bronchus or lung: Secondary | ICD-10-CM | POA: Insufficient documentation

## 2020-05-08 DIAGNOSIS — C3490 Malignant neoplasm of unspecified part of unspecified bronchus or lung: Secondary | ICD-10-CM | POA: Insufficient documentation

## 2020-05-08 DIAGNOSIS — Z510 Encounter for antineoplastic radiation therapy: Secondary | ICD-10-CM | POA: Insufficient documentation

## 2020-05-08 MED ORDER — GADOBUTROL 1 MMOL/ML IV SOLN
9.0000 mL | Freq: Once | INTRAVENOUS | Status: AC | PRN
  Administered 2020-05-08: 9 mL via INTRAVENOUS

## 2020-05-09 ENCOUNTER — Encounter (HOSPITAL_COMMUNITY): Payer: Self-pay | Admitting: Physician Assistant

## 2020-05-09 ENCOUNTER — Ambulatory Visit (HOSPITAL_COMMUNITY)
Admission: RE | Admit: 2020-05-09 | Discharge: 2020-05-09 | Disposition: A | Discharge status: Home / Self Care | Payer: Medicare Other | Source: Ambulatory Visit | Attending: Physician Assistant | Admitting: Physician Assistant

## 2020-05-09 VITALS — BP 112/60 | HR 67 | Ht 72.0 in | Wt 213.8 lb

## 2020-05-09 DIAGNOSIS — J449 Chronic obstructive pulmonary disease, unspecified: Secondary | ICD-10-CM | POA: Insufficient documentation

## 2020-05-09 DIAGNOSIS — I2510 Atherosclerotic heart disease of native coronary artery without angina pectoris: Secondary | ICD-10-CM | POA: Insufficient documentation

## 2020-05-09 DIAGNOSIS — Z7901 Long term (current) use of anticoagulants: Secondary | ICD-10-CM | POA: Insufficient documentation

## 2020-05-09 DIAGNOSIS — I4819 Other persistent atrial fibrillation: Secondary | ICD-10-CM | POA: Diagnosis not present

## 2020-05-09 DIAGNOSIS — E1151 Type 2 diabetes mellitus with diabetic peripheral angiopathy without gangrene: Secondary | ICD-10-CM | POA: Insufficient documentation

## 2020-05-09 DIAGNOSIS — E785 Hyperlipidemia, unspecified: Secondary | ICD-10-CM | POA: Diagnosis not present

## 2020-05-09 DIAGNOSIS — Z951 Presence of aortocoronary bypass graft: Secondary | ICD-10-CM | POA: Diagnosis not present

## 2020-05-09 DIAGNOSIS — C3490 Malignant neoplasm of unspecified part of unspecified bronchus or lung: Secondary | ICD-10-CM | POA: Diagnosis not present

## 2020-05-09 DIAGNOSIS — I4510 Unspecified right bundle-branch block: Secondary | ICD-10-CM | POA: Diagnosis not present

## 2020-05-09 DIAGNOSIS — G4733 Obstructive sleep apnea (adult) (pediatric): Secondary | ICD-10-CM | POA: Diagnosis not present

## 2020-05-09 DIAGNOSIS — Z7984 Long term (current) use of oral hypoglycemic drugs: Secondary | ICD-10-CM | POA: Diagnosis not present

## 2020-05-09 DIAGNOSIS — Z87891 Personal history of nicotine dependence: Secondary | ICD-10-CM | POA: Diagnosis not present

## 2020-05-09 DIAGNOSIS — Z87442 Personal history of urinary calculi: Secondary | ICD-10-CM | POA: Diagnosis not present

## 2020-05-09 DIAGNOSIS — Z79899 Other long term (current) drug therapy: Secondary | ICD-10-CM | POA: Diagnosis not present

## 2020-05-09 DIAGNOSIS — I071 Rheumatic tricuspid insufficiency: Secondary | ICD-10-CM | POA: Insufficient documentation

## 2020-05-09 DIAGNOSIS — Z7951 Long term (current) use of inhaled steroids: Secondary | ICD-10-CM | POA: Diagnosis not present

## 2020-05-09 DIAGNOSIS — I10 Essential (primary) hypertension: Secondary | ICD-10-CM | POA: Insufficient documentation

## 2020-05-09 DIAGNOSIS — D6869 Other thrombophilia: Secondary | ICD-10-CM

## 2020-05-09 DIAGNOSIS — I255 Ischemic cardiomyopathy: Secondary | ICD-10-CM | POA: Diagnosis not present

## 2020-05-09 DIAGNOSIS — I252 Old myocardial infarction: Secondary | ICD-10-CM | POA: Insufficient documentation

## 2020-05-09 DIAGNOSIS — I484 Atypical atrial flutter: Secondary | ICD-10-CM | POA: Insufficient documentation

## 2020-05-09 NOTE — Progress Notes (Signed)
Primary Care Physician: Luetta Nutting, DO Referring Physician: Dr. Gwenlyn Found EP: Dr. Casandra Doffing Muckle is a 74 y.o. male with a h/o  PAF, OSA using cpap, HTN, CAD, s/p bypass, s/p ablation x 2, that is here for f/u, on Multaq, as he has had afib since last week occurring on vacation  while he was at the beach.  He is tolerating fairly well, but notices more nervousness. He tracks his HR on his phone app. He saw Dr. Rayann Heman  last  04/04/15 at which time he was offered repeat ablation,  Multaq, sotalol or tikosyn. He chose multaq and has kept him in rhythm  X almost 3 years. He did require a cardioversion 7 months ago.  Otherwise health has been at his baseline. Continues on xarelto without any missed doses. His qt is of concern usually around 500 ms,(RBBB contributing ), longer today in atrial flutter. He is fatigued out of rhythm and prefers to be in SR. He also prefers to avoid another ablation.   F/u in afib clinic,7/30. Unfortunately, he did not convert with cardioversion, but 3 days later, he returned to [place] , and continues to stay in rhythm. He feels improved. I discussed with Dr. Rayann Heman  prior to [place] as his options are limited. He  thought repeat cardioversion was the best option  for pt.   F/u virtual afib clinic 01/28/19. Pt reports no major issues with afib. Will feel his HR pick up at times but can get it back in rhythm in a matter of minutes with deep breaths. He had a prolonged URI in December, was tested for covid antibodies recently and was negative. He is having to use prn lasix once a week and will lose 3-4 lbs but has gotten into the habit of eating pretzels daily. He is now able to get out and golf and he is happy about that. He is taking covid precautions.  Follow up in the AF clinic 12/14/19. Patient reports he is doing very well since his last visit. He has lost between 15-20 lbs by reducing his portion sizes. He has had two episodes of heart racing which resolved in <2 minutes.  He denies bleeding issues with anticoagulation.   Follow up in the AF clinic 04/19/20. Patient has been diagnosed with early stages of small cell lung cancer. Patient was admitted 04/03/20-04/06/20 for acute anemia requiring transfusion. Anemia appeared to be multifactorial with bone marrow suppression from chemo and GI bleeding. Upper and lower EGD were negative for active bleeding. Xarelto was resumed once platelets were >50,000. He reports that he has felt fatigued since discharge. ECG on 04/10/20 showed he was back out of rhythm.   Follow up in the AF clinic 05/09/20. Patient is s/p DCCV on 05/01/20. He feels much better with more energy. He denies any bleeding issues on anticoagulation. He starts his cancer treatments next week.   Today, he denies symptoms of palpitations, chest pain, shortness of breath, orthopnea, PND, lower extremity edema, dizziness, presyncope, syncope, or neurologic sequela. The patient is tolerating medications without difficulties and is otherwise without complaint today.   Past Medical History:  Diagnosis Date  . Atypical atrial flutter ([place])   . Cancer [facility])    Testicular- surgery   . COPD (chronic obstructive pulmonary disease) ([place])   . Coronary artery disease   . Diabetes mellitus without complication ([place])   . Diastolic dysfunction, left ventricle 05/2018   Dr. Rayann Heman- Cardiologist  . Dysrhythmia    chronic  AFib  . History of kidney stones    passed  . HLD (hyperlipidemia)   . HTN (hypertension)   . Myocardial infarction ([place])    x 2 maybe 1 more  . OSA (obstructive sleep apnea)    uses CPAP  . Peripheral artery disease ([place])    pseudoaneurysm post afib ablation at [facility] 2011, s/p bilateral iliac stents  . Persistent atrial fibrillation ([place])   . Pre-diabetes   . Renal artery stenosis ([facility])    right renal artery PTA and stenting  . S/P coronary artery bypass graft x 7 11/16/96   Past Surgical History:  Procedure Laterality Date  . 2-D echocardiogram   03/25/2010   Normal left ventricular function. Mild MR, TR, trivial AR  . ATRIAL FIBRILLATION ABLATION  03/27/2010, 12/31/2010   [facility], Dr. Nadeen Landau  . BACK SURGERY  2004, 2007   Laminectomy, Discedectomy x 2  . BRONCHIAL BIOPSY  12/27/2019   Procedure: BRONCHIAL BIOPSIES;  Surgeon: Garner Nash, DO;  Location: [place] [facility];  Service: Pulmonary;;  . BRONCHIAL BRUSHINGS  12/27/2019   Procedure: BRONCHIAL BRUSHINGS;  Surgeon: Garner Nash, DO;  Location: [facility];  Service: Pulmonary;;  . BRONCHIAL NEEDLE ASPIRATION BIOPSY  12/27/2019   Procedure: BRONCHIAL NEEDLE ASPIRATION BIOPSIES;  Surgeon: Garner Nash, DO;  Location: [facility];  Service: Pulmonary;;  . cardiac stress test  11/21/2009   Exercise capacity 5 METS. No significant ischemia demonstrated  . CARDIOVERSION N/A 03/15/2015   Procedure: CARDIOVERSION;  Surgeon: Lorretta Harp, MD;  Location: [facility];  Service: Cardiovascular;  Laterality: N/A;  . CARDIOVERSION N/A 07/10/2017   Procedure: CARDIOVERSION;  Surgeon: Pixie Casino, MD;  Location: [facility] ENDOSCOPY;  Service: Cardiovascular;  Laterality: N/A;  . CARDIOVERSION N/A 03/23/2018   Procedure: CARDIOVERSION;  Surgeon: Sanda Klein, MD;  Location: [facility] ENDOSCOPY;  Service: Cardiovascular;  Laterality: N/A;  . CARDIOVERSION N/A 05/01/2020   Procedure: CARDIOVERSION;  Surgeon: Josue Hector, MD;  Location: [facility] ENDOSCOPY;  Service: Cardiovascular;  Laterality: N/A;  . COLONOSCOPY WITH PROPOFOL N/A 04/05/2020   Procedure: COLONOSCOPY WITH PROPOFOL;  Surgeon: Carol Ada, MD;  Location: [facility] ENDOSCOPY;  Service: Endoscopy;  Laterality: N/A;  . CORONARY ARTERY BYPASS GRAFT  1998  . ELECTROMAGNETIC NAVIGATION BROCHOSCOPY  12/27/2019   Procedure: NAVIGATION BRONCHOSCOPY;  Surgeon: Garner Nash, DO;  Location: [facility] ENDOSCOPY;  Service: Pulmonary;;  . ESOPHAGOGASTRODUODENOSCOPY (EGD) WITH PROPOFOL N/A 04/05/2020   Procedure: ESOPHAGOGASTRODUODENOSCOPY (EGD) WITH PROPOFOL;   Surgeon: Carol Ada, MD;  Location: [facility] ENDOS[facility];  Service: Endoscopy;  Laterality: N/A;  . FIDUCIAL MARKER PLACEMENT  12/27/2019   Procedure: FIDUCIAL MARKER PLACEMENT;  Surgeon: Garner Nash, DO;  Location: [facility] ENDOSCOPY;  Service: Pulmonary;;  . HOT HEMOSTASIS N/A 04/05/2020   Procedure: HOT HEMOSTASIS (ARGON PLASMA COAGULATION/BICAP);  Surgeon: Carol Ada, MD;  Location: [facility];  Service: Endoscopy;  Laterality: N/A;  . [place]   for cancer  . POLYPECTOMY  04/05/2020   Procedure: POLYPECTOMY;  Surgeon: Carol Ada, MD;  Location: [facility];  Service: Endoscopy;;  . TOOTH EXTRACTION  05/27/2013   tooth extraction with bone graft- several -for Implants  . VIDEO BRONCHOSCOPY WITH ENDOBRONCHIAL NAVIGATION N/A 12/27/2019   Procedure: VIDEO BRONCHOSCOPY;  Surgeon: Garner Nash, DO;  Location: [facility];  Service: Pulmonary;  Laterality: N/A;    Current Outpatient Medications  Medication Sig Dispense Refill  . acetaminophen (TYLENOL) 500 MG tablet Take 1,000 mg by mouth as needed for mild pain or moderate pain (  for pain.).     [name] atorvastatin (LIPITOR) 10 MG tablet TAKE 1 TABLET(10 MG) BY MOUTH DAILY 90 tablet 2  . budesonide-formoterol (SYMBICORT) 160-4.5 MCG/ACT inhaler Inhale 2 puffs into the lungs 2 (two) times daily. 10.2 g 3  . cetirizine (ZYRTEC) 10 MG tablet Take 10 mg by mouth daily as needed for allergies.     [name] dronedarone (MULTAQ) 400 MG tablet TAKE 1 TABLET BY MOUTH TWICE DAILY WITH MEAL 180 tablet 1  . empagliflozin (JARDIANCE) 10 MG TABS tablet Take 10 mg by mouth daily. Schedule appt with PCP 90 tablet 2  . gabapentin (NEURONTIN) 300 MG capsule Take 2 capsules (600 mg total) by mouth at bedtime. 180 capsule 2  . ketotifen (ZADITOR) 0.025 % ophthalmic solution Place 1 drop into both eyes 2 (two) times daily as needed (allergies).    [name] lisinopril-hydrochlorothiazide (ZESTORETIC) 10-12.5 MG tablet TAKE 1 TABLET BY MOUTH DAILY 90 tablet 1  . memantine  (NAMENDA) 5 MG tablet After titrating over 4 weeks: Take 2 tablets po q am and qpm for next 20 weeks. (Patient taking differently: Take 5 mg by mouth See admin instructions. After titrating over 4 weeks: Take 2 tablets po q am and qpm for next 20 weeks.) 112 tablet 5  . memantine (NAMENDA) 5 MG tablet Begin this prescription the first day of brain radiation. Week 1: take one tablet po qam. Week 2: take one tablet qam and qpm. Week 3: take two tablets qam, and one tablet po q pm. Week 4: take two tablets qam and qpm. Fill subsequent prescription q month. (Patient taking differently: Take 5 mg by mouth See admin instructions. Begin this prescription the first day of brain radiation. Week 1: take one tablet po qam. Week 2: take one tablet qam and qpm. Week 3: take two tablets qam, and one tablet po q pm. Week 4: take two tablets qam and qpm. Fill subsequent prescription q month.) 70 tablet 0  . metoprolol succinate (TOPROL-XL) 100 MG 24 hr tablet TAKE 125MG  (1 AND 1/4 TAB) DAILY 90 tablet 3  . Multiple Vitamin (MULTIVITAMIN WITH MINERALS) TABS tablet Take 1 tablet by mouth daily. Centrum silver    . niacin (NIASPAN) 1000 MG CR tablet TAKE 1 TABLET(1000 MG) BY MOUTH AT BEDTIME 90 tablet 3  . nicotine polacrilex (COMMIT) 2 MG lozenge Take 2 mg by mouth daily as needed for smoking cessation.     . nitroGLYCERIN (NITROSTAT) 0.4 MG SL tablet Place 1 tablet (0.4 mg total) under the tongue every 5 (five) minutes as needed. 25 tablet 3  . NON FORMULARY CPAP at night    . oxymetazoline (AFRIN) 0.05 % nasal spray Place 1 spray into both nostrils daily as needed for congestion.     . pantoprazole (PROTONIX) 40 MG tablet Take 1 tablet (40 mg total) by mouth daily. 30 tablet 2  . prochlorperazine (COMPAZINE) 10 MG tablet TAKE 1 TABLET(10 MG) BY MOUTH EVERY 6 HOURS AS NEEDED FOR NAUSEA OR VOMITING 30 tablet 0  . traZODone (DESYREL) 50 MG tablet TAKE 1/2 TO 1 TABLET BY MOUTH AT BEDTIME AS NEEDED FOR SLEEP 90 tablet 3  .  VENTOLIN HFA 108 (90 Base) MCG/ACT inhaler INHALE 2 PUFFS INTO THE LUNGS EVERY 6 HOURS AS NEEDED FOR WHEEZING OR SHORTNESS OF BREATH 18 g 1  . XARELTO 20 MG TABS tablet TAKE 1 TABLET BY MOUTH EVERY DAY WITH SUPPER 90 tablet 1   No current facility-administered medications for this encounter.  No Known Allergies  Social History   Socioeconomic History  . Marital status: Divorced    Spouse name: Not on file  . Number of children: 2  . Years of education: 95  . Highest education level: Associate degree: academic program  Occupational History  . 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 Smoking status: Former Smoker    Packs/day: 1.50    Years: 30.00    Pack years: 45.00    Types: Cigarettes, Cigars    Quit date: 09/01/1994    Years since quitting: 25.7  . Smokeless tobacco: Never Used  . Tobacco comment: cigars quit  - 11/16/2019  Vaping Use  . Vaping Use: Never used  Substance and Sexual Activity  . Alcohol use: Not Currently  . Drug use: No  . Sexual activity: Yes  Other Topics Concern  . Not on file  Social History Narrative   Pt lives in [place] alone.  Divorced. 2 cups of coffee in the morning.   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 Difficulty of Paying Living Expenses: Not on file  Food Insecurity:   . Worried About Charity fundraiser in the Last Year: Not on file  . Ran Out of Food in the Last Year: Not on file  Transportation Needs:   . Lack of Transportation (Medical): Not on file  . Lack of Transportation (Non-Medical): Not on file  Physical Activity:   . Days of Exercise per Week: Not on file  . Minutes of Exercise per Session: Not on file  Stress:   . Feeling of Stress : Not on file  Social Connections:   . Frequency of Communication with Friends and Family: Not on file  . Frequency of Social Gatherings with Friends and Family: Not on file  .  Attends Religious Services: Not on file  . Active Member of Clubs or Organizations: Not on file  . Attends Archivist Meetings: Not on file  . Marital Status: Not on file  Intimate Partner Violence:   . Fear of Current or Ex-Partner: Not on file  . Emotionally Abused: Not on file  . Physically Abused: Not on file  . Sexually Abused: Not on file    Family History  Problem Relation Age of Onset  . Cancer Mother   . Cancer Father   . Other Brother        NO MEDICAL PROBLEMS  . Other Son        NO MEDICAL PROBLEMS  . Leukemia Daughter 6       Recover and has no other problems    ROS- All systems are reviewed and negative except as per the HPI above  Physical Exam: Vitals:   05/09/20 1004  BP: 112/60  Pulse: 67  Weight: 97 kg  Height: 6' (1.829 m)   Wt Readings from Last 3 Encounters:  05/09/20 97 kg  05/01/20 95.3 kg  04/23/20 95.8 kg    Labs: Lab Results  Component Value Date   NA 140 04/23/2020   K 3.9 04/23/2020   CL 103 04/23/2020   CO2 28 04/23/2020   GLUCOSE 143 (H) 04/23/2020   BUN 12 04/23/2020   CREATININE 1.07 04/23/2020   CALCIUM 9.8 04/23/2020   Lab Results  Component Value Date   INR 1.1 12/27/2019   Lab Results  Component Value Date   CHOL 99  05/27/2018   HDL 44 05/27/2018   LDLCALC 40 05/27/2018   TRIG 71 05/27/2018    GEN- The patient is well appearing, alert and oriented x 3 today.   HEENT-head normocephalic, atraumatic, sclera clear, conjunctiva pink, hearing intact, trachea midline. Lungs- Clear to ausculation bilaterally, normal work of breathing Heart- Regular rate and rhythm, no murmurs, rubs or gallops  GI- soft, NT, ND, + BS Extremities- no clubbing, cyanosis, or edema MS- no significant deformity or atrophy Skin- no rash or lesion Psych- euthymic mood, full affect Neuro- strength and sensation are intact   EKG- SR HR 67, RBBB, PR 144, QRS 126, QTc 496  Echo 07/27/19 1. Left ventricular ejection fraction, by  visual estimation, is 40 to  45%. The left ventricle has mild to moderately decreased function. There  is no left ventricular hypertrophy.  2. Mid and apical anterior septum, apical lateral segment, apical  anterior segment, and apex are hypokinetic.  3. Definity contrast agent was given IV to delineate the left ventricular  endocardial borders.  4. Elevated left atrial pressure.  5. Left ventricular diastolic parameters are consistent with Grade II  diastolic dysfunction (pseudonormalization).  6. The left ventricle demonstrates regional wall motion abnormalities.  7. Global right ventricle has moderately reduced systolic function.The  right ventricular size is normal. No increase in right ventricular wall  thickness.  8. Left atrial size was mildly dilated.  9. Right atrial size was mildly dilated.  10. The mitral valve is normal in structure. No evidence of mitral valve  regurgitation.  11. The tricuspid valve is normal in structure. Tricuspid valve  regurgitation mild-moderate.  12. The aortic valve is normal in structure. Aortic valve regurgitation is  not visualized.  13. The pulmonic valve was grossly normal. Pulmonic valve regurgitation is  not visualized.  14. Mildly elevated pulmonary artery systolic pressure.  15. The inferior vena cava is dilated in size with >50% respiratory  variability, suggesting right atrial pressure of 8 mmHg.  16. No intracardiac thrombi or masses were visualized.   Epic records reviewed   Assessment and Plan: 1.  Persistent atrial fibrillation/atypical atrial flutter S/p PVI x2 at [facility]. Failed amiodarone 2/2 elevated LFTs and abnormal PFTs. S/p DCCV on 05/01/20. Patient appears to be maintaining SR. Continue Multaq 400 mg BID Continue Xarelto 20 mg daily Continue Toprol 100 mg daily  This patients CHA2DS2-VASc Score and unadjusted Ischemic Stroke Rate (% per year) is equal to 4.8 % stroke rate/year from a score of 4  Above score  calculated as 1 point each if present [CHF, HTN, DM, Vascular=MI/PAD/Aortic Plaque, Age if 65-74, or Male] Above score calculated as 2 points each if present [Age > 75, or Stroke/TIA/TE]  2. HTN Stable, no changes today.  3. CAD/ischemic CM S/p CABG 1998. EF 45-50% No anginal symptoms.  4. OSA Patient reports compliance with CPAP therapy. Followed by Dr Claiborne Billings.   Follow up with Dr Gwenlyn Found and AF clinic per recalls.    [facility] [address] [place], [place] [address] [phone]

## 2020-05-11 DIAGNOSIS — C3411 Malignant neoplasm of upper lobe, right bronchus or lung: Secondary | ICD-10-CM | POA: Diagnosis not present

## 2020-05-11 DIAGNOSIS — Z510 Encounter for antineoplastic radiation therapy: Secondary | ICD-10-CM | POA: Diagnosis not present

## 2020-05-15 ENCOUNTER — Other Ambulatory Visit: Payer: Self-pay

## 2020-05-15 ENCOUNTER — Ambulatory Visit
Admission: RE | Admit: 2020-05-15 | Discharge: 2020-05-15 | Disposition: A | Discharge status: Home / Self Care | Payer: Medicare Other | Source: Ambulatory Visit | Attending: Radiation Oncology | Admitting: Radiation Oncology

## 2020-05-15 DIAGNOSIS — C3411 Malignant neoplasm of upper lobe, right bronchus or lung: Secondary | ICD-10-CM | POA: Diagnosis not present

## 2020-05-15 DIAGNOSIS — Z510 Encounter for antineoplastic radiation therapy: Secondary | ICD-10-CM | POA: Diagnosis not present

## 2020-05-16 ENCOUNTER — Other Ambulatory Visit: Payer: Self-pay

## 2020-05-16 ENCOUNTER — Ambulatory Visit
Admission: RE | Admit: 2020-05-16 | Discharge: 2020-05-16 | Disposition: A | Discharge status: Home / Self Care | Payer: Medicare Other | Source: Ambulatory Visit | Attending: Radiation Oncology | Admitting: Radiation Oncology

## 2020-05-16 DIAGNOSIS — Z510 Encounter for antineoplastic radiation therapy: Secondary | ICD-10-CM | POA: Diagnosis not present

## 2020-05-16 DIAGNOSIS — C3411 Malignant neoplasm of upper lobe, right bronchus or lung: Secondary | ICD-10-CM | POA: Diagnosis not present

## 2020-05-17 ENCOUNTER — Ambulatory Visit
Admission: RE | Admit: 2020-05-17 | Discharge: 2020-05-17 | Disposition: A | Discharge status: Home / Self Care | Payer: Medicare Other | Source: Ambulatory Visit | Attending: Radiation Oncology | Admitting: Radiation Oncology

## 2020-05-17 ENCOUNTER — Other Ambulatory Visit: Payer: Self-pay

## 2020-05-17 DIAGNOSIS — Z510 Encounter for antineoplastic radiation therapy: Secondary | ICD-10-CM | POA: Diagnosis not present

## 2020-05-17 DIAGNOSIS — C3411 Malignant neoplasm of upper lobe, right bronchus or lung: Secondary | ICD-10-CM | POA: Diagnosis not present

## 2020-05-18 ENCOUNTER — Other Ambulatory Visit: Payer: Self-pay

## 2020-05-18 ENCOUNTER — Ambulatory Visit
Admission: RE | Admit: 2020-05-18 | Discharge: 2020-05-18 | Disposition: A | Discharge status: Home / Self Care | Payer: Medicare Other | Source: Ambulatory Visit | Attending: Radiation Oncology | Admitting: Radiation Oncology

## 2020-05-18 DIAGNOSIS — Z510 Encounter for antineoplastic radiation therapy: Secondary | ICD-10-CM | POA: Diagnosis not present

## 2020-05-18 DIAGNOSIS — C3411 Malignant neoplasm of upper lobe, right bronchus or lung: Secondary | ICD-10-CM | POA: Diagnosis not present

## 2020-05-21 ENCOUNTER — Ambulatory Visit
Admission: RE | Admit: 2020-05-21 | Discharge: 2020-05-21 | Disposition: A | Discharge status: Home / Self Care | Payer: Medicare Other | Source: Ambulatory Visit | Attending: Radiation Oncology | Admitting: Radiation Oncology

## 2020-05-21 ENCOUNTER — Other Ambulatory Visit: Payer: Self-pay

## 2020-05-21 DIAGNOSIS — C3411 Malignant neoplasm of upper lobe, right bronchus or lung: Secondary | ICD-10-CM | POA: Diagnosis not present

## 2020-05-21 DIAGNOSIS — Z510 Encounter for antineoplastic radiation therapy: Secondary | ICD-10-CM | POA: Diagnosis not present

## 2020-05-22 ENCOUNTER — Ambulatory Visit
Admission: RE | Admit: 2020-05-22 | Discharge: 2020-05-22 | Disposition: A | Discharge status: Home / Self Care | Payer: Medicare Other | Source: Ambulatory Visit | Attending: Radiation Oncology | Admitting: Radiation Oncology

## 2020-05-22 DIAGNOSIS — C3411 Malignant neoplasm of upper lobe, right bronchus or lung: Secondary | ICD-10-CM | POA: Diagnosis not present

## 2020-05-22 DIAGNOSIS — Z510 Encounter for antineoplastic radiation therapy: Secondary | ICD-10-CM | POA: Diagnosis not present

## 2020-05-23 ENCOUNTER — Other Ambulatory Visit: Payer: Self-pay

## 2020-05-23 ENCOUNTER — Ambulatory Visit
Admission: RE | Admit: 2020-05-23 | Discharge: 2020-05-23 | Disposition: A | Discharge status: Home / Self Care | Payer: Medicare Other | Source: Ambulatory Visit | Attending: Radiation Oncology | Admitting: Radiation Oncology

## 2020-05-23 DIAGNOSIS — Z510 Encounter for antineoplastic radiation therapy: Secondary | ICD-10-CM | POA: Diagnosis not present

## 2020-05-23 DIAGNOSIS — C3411 Malignant neoplasm of upper lobe, right bronchus or lung: Secondary | ICD-10-CM | POA: Diagnosis not present

## 2020-05-23 MED ORDER — ATORVASTATIN CALCIUM 10 MG PO TABS
10.0000 mg | ORAL_TABLET | Freq: Every day | ORAL | 3 refills | Status: DC
Start: 2020-05-23 — End: 2021-05-08

## 2020-05-24 ENCOUNTER — Other Ambulatory Visit: Payer: Self-pay

## 2020-05-24 ENCOUNTER — Ambulatory Visit
Admission: RE | Admit: 2020-05-24 | Discharge: 2020-05-24 | Disposition: A | Discharge status: Home / Self Care | Payer: Medicare Other | Source: Ambulatory Visit | Attending: Radiation Oncology | Admitting: Radiation Oncology

## 2020-05-24 DIAGNOSIS — Z510 Encounter for antineoplastic radiation therapy: Secondary | ICD-10-CM | POA: Diagnosis not present

## 2020-05-24 DIAGNOSIS — C3411 Malignant neoplasm of upper lobe, right bronchus or lung: Secondary | ICD-10-CM | POA: Diagnosis not present

## 2020-05-25 ENCOUNTER — Ambulatory Visit
Admission: RE | Admit: 2020-05-25 | Discharge: 2020-05-25 | Disposition: A | Discharge status: Home / Self Care | Payer: Medicare Other | Source: Ambulatory Visit | Attending: Radiation Oncology | Admitting: Radiation Oncology

## 2020-05-25 ENCOUNTER — Other Ambulatory Visit: Payer: Self-pay

## 2020-05-25 DIAGNOSIS — C3411 Malignant neoplasm of upper lobe, right bronchus or lung: Secondary | ICD-10-CM | POA: Diagnosis not present

## 2020-05-25 DIAGNOSIS — Z510 Encounter for antineoplastic radiation therapy: Secondary | ICD-10-CM | POA: Diagnosis not present

## 2020-05-28 ENCOUNTER — Ambulatory Visit
Admission: RE | Admit: 2020-05-28 | Discharge: 2020-05-28 | Disposition: A | Discharge status: Home / Self Care | Payer: Medicare Other | Source: Ambulatory Visit | Attending: Radiation Oncology | Admitting: Radiation Oncology

## 2020-05-28 ENCOUNTER — Encounter: Payer: Self-pay | Admitting: Radiation Oncology

## 2020-05-28 DIAGNOSIS — C3411 Malignant neoplasm of upper lobe, right bronchus or lung: Secondary | ICD-10-CM | POA: Diagnosis not present

## 2020-05-28 DIAGNOSIS — Z510 Encounter for antineoplastic radiation therapy: Secondary | ICD-10-CM | POA: Diagnosis not present

## 2020-06-01 ENCOUNTER — Other Ambulatory Visit: Payer: Self-pay | Admitting: Cardiovascular Disease

## 2020-06-01 ENCOUNTER — Other Ambulatory Visit: Payer: Self-pay | Admitting: Radiation Oncology

## 2020-06-01 DIAGNOSIS — I480 Paroxysmal atrial fibrillation: Secondary | ICD-10-CM

## 2020-06-01 NOTE — Telephone Encounter (Signed)
Prescription refill request for Xarelto received.  Indication:  Atrial Fibrillation Last office visit: 05/2020  [place] Weight: 97 kg Age:  74 Scr:1.07  04/2020 CrCl: 85.62 ml/min  Prescription refilled

## 2020-06-02 ENCOUNTER — Encounter: Payer: Self-pay | Admitting: Internal Medicine

## 2020-06-05 ENCOUNTER — Other Ambulatory Visit: Payer: Self-pay | Admitting: Radiation Oncology

## 2020-06-05 MED ORDER — MEMANTINE HCL 10 MG PO TABS: ORAL_TABLET | ORAL | 4 refills | Status: DC

## 2020-06-13 ENCOUNTER — Other Ambulatory Visit: Payer: Self-pay | Admitting: Internal Medicine

## 2020-06-15 DIAGNOSIS — G4733 Obstructive sleep apnea (adult) (pediatric): Secondary | ICD-10-CM | POA: Diagnosis not present

## 2020-06-15 NOTE — Progress Notes (Signed)
  Radiation Oncology         [phone] ________________________________  Name: [name] MRN: [id number]  Date: 05/28/2020  DOB: [birth year]  End of Treatment Note  Diagnosis:   Small cell lung cancer  Indication for treatment::  palliative       Radiation treatment dates:   05/15/2020 through 05/28/2020  Site/dose:   The patient was treated with a course of whole brain radiation therapy to a dose of 25 gy in 10 fractions.  This was accomplished using a 2 field technique with additional reduced fields as necessary to improve dose homogeneity.  Narrative: The patient tolerated radiation treatment relatively well.   No unexpected difficulties in terms of acute toxicity.  The skin held up to treatment fairly well during his course of prophylactic cranial irradiation  Plan: The patient has completed radiation treatment. The patient will return to radiation oncology clinic for routine followup in one month. I advised the patient to call or return sooner if they have any questions or concerns related to their recovery or treatment. ________________________________  Jodelle Gross, M.D., Ph.D.

## 2020-06-18 ENCOUNTER — Telehealth: Payer: Self-pay | Admitting: Radiation Oncology

## 2020-06-18 MED ORDER — ONDANSETRON HCL 8 MG PO TABS: 8.0000 mg | ORAL_TABLET | Freq: Three times a day (TID) | ORAL | 1 refills | Status: DC | PRN

## 2020-06-18 MED ORDER — PROCHLORPERAZINE MALEATE 10 MG PO TABS: ORAL_TABLET | ORAL | 0 refills | Status: AC

## 2020-06-18 NOTE — Telephone Encounter (Signed)
I received a message from medical oncology that the patient was having some concerns with nausea and some other symptoms. I called and left him a message asking to call me back so we could discuss further.

## 2020-06-18 NOTE — Telephone Encounter (Signed)
I called the patient back when he returned my call. He recently completed a course of PCI for his history of limited stage small cell lung cancer. Again he did not have disease in the brain, this was preventative. We also started Namenda to try to preserve his cognitive function since his whole brain was treated. He has had progressive nausea over the last two weeks. No other complaints are noted, but he also has a history of a gastric ulceration that was treated with coagulation in August 2021. He has not had any more tarry black stool. We decided to refill Compazine, and add Zofran for nausea, and stop Namenda for now. I'll call him back later this week to see how he's doing.

## 2020-06-19 ENCOUNTER — Other Ambulatory Visit: Payer: Self-pay

## 2020-06-19 MED ORDER — LISINOPRIL-HYDROCHLOROTHIAZIDE 10-12.5 MG PO TABS: 1.0000 | ORAL_TABLET | Freq: Every day | ORAL | 3 refills | Status: DC

## 2020-06-21 ENCOUNTER — Telehealth: Payer: Self-pay | Admitting: Radiation Oncology

## 2020-06-21 NOTE — Telephone Encounter (Signed)
I called the patient to see how he was doing since our conversation on Monday with holding his Namenda to see if this would help his symptoms along with the antiemetics that were sent in. I had to leave a message but asked him to call back to speak with Blenda Nicely, RN.

## 2020-06-25 ENCOUNTER — Other Ambulatory Visit: Payer: Self-pay | Admitting: Cardiovascular Disease

## 2020-07-18 ENCOUNTER — Other Ambulatory Visit (HOSPITAL_COMMUNITY): Payer: Self-pay | Admitting: Nurse Practitioner

## 2020-07-18 DIAGNOSIS — I480 Paroxysmal atrial fibrillation: Secondary | ICD-10-CM

## 2020-07-25 ENCOUNTER — Ambulatory Visit (HOSPITAL_COMMUNITY)
Admission: RE | Admit: 2020-07-25 | Discharge: 2020-07-25 | Disposition: A | Discharge status: Home / Self Care | Payer: Medicare Other | Source: Ambulatory Visit | Attending: Cardiovascular Disease | Admitting: Cardiovascular Disease

## 2020-07-25 ENCOUNTER — Encounter (HOSPITAL_COMMUNITY): Payer: Medicare Other

## 2020-07-25 ENCOUNTER — Other Ambulatory Visit: Payer: Self-pay

## 2020-07-25 DIAGNOSIS — I739 Peripheral vascular disease, unspecified: Secondary | ICD-10-CM | POA: Diagnosis not present

## 2020-07-25 DIAGNOSIS — I771 Stricture of artery: Secondary | ICD-10-CM

## 2020-08-04 ENCOUNTER — Other Ambulatory Visit: Payer: Self-pay | Admitting: Family Medicine

## 2020-08-06 NOTE — Telephone Encounter (Signed)
Due for follow up

## 2020-08-06 NOTE — Telephone Encounter (Signed)
Please contact patient to schedule a follow up appointment with provider before next refill request. Thanks in advance.

## 2020-08-08 ENCOUNTER — Ambulatory Visit: Payer: Medicare Other | Admitting: Cardiovascular Disease

## 2020-08-08 ENCOUNTER — Other Ambulatory Visit: Payer: Self-pay

## 2020-08-08 ENCOUNTER — Encounter: Payer: Self-pay | Admitting: Cardiovascular Disease

## 2020-08-08 VITALS — BP 100/62 | HR 73 | Ht 72.0 in | Wt 209.0 lb

## 2020-08-08 DIAGNOSIS — I739 Peripheral vascular disease, unspecified: Secondary | ICD-10-CM

## 2020-08-08 DIAGNOSIS — Z951 Presence of aortocoronary bypass graft: Secondary | ICD-10-CM | POA: Diagnosis not present

## 2020-08-08 DIAGNOSIS — I480 Paroxysmal atrial fibrillation: Secondary | ICD-10-CM

## 2020-08-08 DIAGNOSIS — I771 Stricture of artery: Secondary | ICD-10-CM

## 2020-08-08 DIAGNOSIS — E1159 Type 2 diabetes mellitus with other circulatory complications: Secondary | ICD-10-CM

## 2020-08-08 DIAGNOSIS — E1169 Type 2 diabetes mellitus with other specified complication: Secondary | ICD-10-CM

## 2020-08-08 DIAGNOSIS — I152 Hypertension secondary to endocrine disorders: Secondary | ICD-10-CM

## 2020-08-08 DIAGNOSIS — E785 Hyperlipidemia, unspecified: Secondary | ICD-10-CM

## 2020-08-08 MED ORDER — SODIUM CHLORIDE 0.9% FLUSH: 3.0000 mL | Freq: Two times a day (BID) | INTRAVENOUS | Status: AC

## 2020-08-08 NOTE — Assessment & Plan Note (Signed)
History of peripheral arterial disease status post bilateral iliac artery PTA and stenting by myself remotely with restenting in 2007.  He is also had right renal artery PTA and stenting by myself as well.  He complains of left greater than right lower extremity lifestyle limiting claudication with recent Dopplers performed 07/25/2020 revealing right ABI of 0.87 and a left of 0.82.  He has high-frequency signal in his left external iliac artery which has progressed to moderately elevated velocity in the right external iliac artery as well.  He wishes to proceed with angiography potential intervention.  We will schedule this sometime in January.  He will need to hold his Xarelto for 2 days prior to the procedure.

## 2020-08-08 NOTE — Assessment & Plan Note (Signed)
History of CAD status post CABG times 73/18/98.  His last Myoview performed 06/07/2013 showed inferolateral scar without ischemia.  He denies chest pain or shortness of breath.

## 2020-08-08 NOTE — Telephone Encounter (Signed)
Follow up appointment scheduled for next week. AM

## 2020-08-08 NOTE — Patient Instructions (Addendum)
    [place] [address] Dept: [phone] Loc: [place]  08/08/2020  You are scheduled for a Peripheral Angiogram on Monday, January 17 with Dr. Quay Burow.  1. Please arrive at the [facility] (Main Entrance A) at [facility]: [address] [place], [place] [address] at 5:30 AM (This time is two hours before your procedure to ensure your preparation). Free valet parking service is available.   Special note: Every effort is made to have your procedure done on time. Please understand that emergencies sometimes delay scheduled procedures.  2. Diet: Do not eat solid foods after midnight.  The patient may have clear liquids until 5am upon the day of the procedure.  3. Labs: You will need to have blood drawn in the next few weeks.  You do not need to be fasting.  4. Medication instructions in preparation for your procedure:  Stop taking Xarelto (Rivaroxaban) on Saturday, January 15.  On the morning of your procedure, take your Aspirin and any morning medicines NOT listed above.  You may use sips of water.  5. Plan for one night stay--bring personal belongings. 6. Bring a current list of your medications and current insurance cards. 7. You MUST have a responsible person to drive you home. 8. Someone MUST be with you the first 24 hours after you arrive home or your discharge will be delayed. 9. Please wear clothes that are easy to get on and off and wear slip-on shoes.  Thank you for allowing [place] to care for you!   -- [facility]   You will need a COVID-19  test prior to your procedure. You are scheduled for Friday, 1/14 at 8:05 AM/PM. This is a Drive Up Visit at [address] [place], [place] [address]. Someone will direct you to the appropriate testing line. Stay in your car and someone will be with you  shortly.  You will need a follow up abdominal aorta and lower extremity ultrasound 1 week after your procedure.  You will need a follow up appointment with Dr. Gwenlyn Found 2 weeks after your procedure.

## 2020-08-08 NOTE — Assessment & Plan Note (Signed)
History of ischemic cardiomyopathy with an EF by 2D echo performed 04/24/2020 of 40% without significant valvular abnormalities.  He is on appropriate medications and is asymptomatic.

## 2020-08-08 NOTE — Assessment & Plan Note (Signed)
History of essential hypertension a blood pressure measured today 100/62.  He is on lisinopril, hydrochlorothiazide and metoprolol.

## 2020-08-08 NOTE — Assessment & Plan Note (Signed)
History of PAF status post remote ablation at [facility] as well as multiple DC cardioversions Gwenlyn Found is been on multiple antiarrhythmic medications as well.  His last cardioversion was performed by Dr. Johnsie Cancel 05/01/2020 which which was successful.  He was seen back in the A. fib clinic shortly thereafter in sinus rhythm.  Today he is back in A. fib but is unaware of that.  He is on Xarelto.

## 2020-08-08 NOTE — Progress Notes (Signed)
08/08/2020 [name]   Mar 22, 1946  [id number]  Primary Physician Richard Nutting, DO Primary Cardiologist: Richard Harp MD FACP, [place], [place], [place]  HPI:  [name] is a 74 y.o.    moderately overweight, divorced, Caucasian male father of 2, grandfather of 1 grandchild who I saw  07/13/2019.He has a history of CAD status post coronary artery bypass grafting x7 November 16, 1996. His other problems include PVOD status post bilateral iliac artery PTA and stenting by myself remotely with restenting in 2007. He has had right renal artery PTA and stenting as well. His other problems include hypertension, hyperlipidemia, non-insulin-requiring diabetes. He does have paroxysmal atrial fibrillation and has undergone multiple DC cardioversions as well as atrial fibrillation ablations by Dr. Samara Hogan at [facility], most recently in May of last year, though he is now back in atrial fibrillation/flutter on a daily basis, which he is aware of but not symptomatic from. He is active and works out on a treadmill every day. He denies chest pain or shortness of breath. His last stress test performed one year ago which revealed an inferolateral scar without ischemia.  Since I saw him at 9 months ago has remained clinically stable. He denies chest pain, shortness of breath or claudication. He works out on a treadmill or plays golf daily. He is a Myoview stress test performed in October 2014 that showed inferolateral scar without ischemia unchanged from prior studies. Recent arterial Doppler studies of his lower extremities revealed ABIs in the 0.9 range bilaterally with patent iliac stents.he's noticed tachycardia over the last several months which on EKG yesterday is suggested A. Fib with RVR. He has been on Coumadin anticoagulation and has been therapeutic. I performed outpatient DC cardioversion on him 03/15/15 successfully back to sinus rhythm. He saw Dr. Rayann Hogan subsequent to that and was placed on  Multaq antiarrhythmic therapy. His Coumadin was switched to Xarelto .   Since I saw him a year ago he was diagnosis with small cell lung cancer unfortunately.  Chest CT performed 11/16/2019 revealing nodule and biopsy confirmed small cell cancer.  Is been treated with chemotherapy and radiation therapy.  He also underwent cardioversion by Dr. Johnsie Hogan 05/01/2020 successfully after 3 shocks to sinus rhythm and was seen in the A. fib clinic shortly thereafter maintaining sinus rhythm.  Unfortunately, he is in A. fib today with controlled ventricular response although he is unaware that.  He also complains of bilateral lower extremity claudication left greater than right which is lifestyle limiting with Dopplers to suggest high-grade disease in both right and left common and external iliac arteries.  He wishes to proceed with angiography and intervention.    Current Meds  Medication Sig  . acetaminophen (TYLENOL) 500 MG tablet Take 1,000 mg by mouth as needed for mild pain or moderate pain (for pain.).   [name] atorvastatin (LIPITOR) 10 MG tablet Take 1 tablet (10 mg total) by mouth daily.  . budesonide-formoterol (SYMBICORT) 160-4.5 MCG/ACT inhaler Inhale 2 puffs into the lungs 2 (two) times daily.  [name] gabapentin (NEURONTIN) 300 MG capsule TAKE 2 CAPSULES(600 MG) BY MOUTH AT BEDTIME  . ketotifen (ZADITOR) 0.025 % ophthalmic solution Place 1 drop into both eyes 2 (two) times daily as needed (allergies).  [name] lisinopril-hydrochlorothiazide (ZESTORETIC) 10-12.5 MG tablet Take 1 tablet by mouth daily.  . metoprolol succinate (TOPROL-XL) 100 MG 24 hr tablet TAKE 125MG  (1 AND 1/4 TAB) DAILY (Patient taking differently: Take 100 mg by mouth daily. TAKE 100  MG DAILY)  . Multiple Vitamin (MULTIVITAMIN WITH MINERALS) TABS tablet Take 1 tablet by mouth daily. Centrum silver  . niacin (NIASPAN) 1000 MG CR tablet TAKE 1 TABLET(1000 MG) BY MOUTH AT BEDTIME  . nicotine polacrilex (COMMIT) 2 MG lozenge Take 2 mg by mouth daily  as needed for smoking cessation.   . nitroGLYCERIN (NITROSTAT) 0.4 MG SL tablet Place 1 tablet (0.4 mg total) under the tongue every 5 (five) minutes as needed.  . NON FORMULARY CPAP at night  . ondansetron (ZOFRAN) 8 MG tablet Take 1 tablet (8 mg total) by mouth every 8 (eight) hours as needed for nausea or vomiting.  [name] oxymetazoline (AFRIN) 0.05 % nasal spray Place 1 spray into both nostrils daily as needed for congestion.   . pantoprazole (PROTONIX) 40 MG tablet Take 1 tablet (40 mg total) by mouth daily.  . prochlorperazine (COMPAZINE) 10 MG tablet TAKE 1 TABLET(10 MG) BY MOUTH EVERY 6 HOURS AS NEEDED FOR NAUSEA OR VOMITING  . traMADol (ULTRAM) 50 MG tablet TAKE 1 TABLET(50 MG) BY MOUTH EVERY 12 HOURS AS NEEDED  . traZODone (DESYREL) 50 MG tablet TAKE 1/2 TO 1 TABLET BY MOUTH AT BEDTIME AS NEEDED FOR SLEEP  . VENTOLIN HFA 108 (90 Base) MCG/ACT inhaler INHALE 2 PUFFS INTO THE LUNGS EVERY 6 HOURS AS NEEDED FOR WHEEZING OR SHORTNESS OF BREATH  . XARELTO 20 MG TABS tablet TAKE 1 TABLET BY MOUTH EVERY DAY WITH SUPPER     No Known Allergies  Social History   Socioeconomic History  . Marital status: Divorced    Spouse name: Not on file  . Number of children: 2  . Years of education: 51  . Highest education level: Associate degree: academic program  Occupational History  . 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 Smoking status: Former Smoker    Packs/day: 1.50    Years: 30.00    Pack years: 45.00    Types: Cigarettes, Cigars    Quit date: 09/01/1994    Years since quitting: 25.9  . Smokeless tobacco: Never Used  . Tobacco comment: cigars quit  - 11/16/2019  Vaping Use  . Vaping Use: Never used  Substance and Sexual Activity  . Alcohol use: Not Currently  . Drug use: No  . Sexual activity: Yes  Other Topics Concern  . Not on file  Social History Narrative   Pt lives in [place] alone.  Divorced. 2 cups of coffee in the morning.   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 Difficulty of Paying Living Expenses: Not on file  Food Insecurity:   . Worried About Charity fundraiser in the Last Year: Not on file  . Ran Out of Food in the Last Year: Not on file  Transportation Needs:   . Lack of Transportation (Medical): Not on file  . Lack of Transportation (Non-Medical): Not on file  Physical Activity:   . Days of Exercise per Week: Not on file  . Minutes of Exercise per Session: Not on file  Stress:   . Feeling of Stress : Not on file  Social Connections:   . Frequency of Communication with Friends and Family: Not on file  . Frequency of Social Gatherings with Friends and Family: Not on file  . Attends Religious Services: Not on file  . Active Member of Clubs or Organizations: Not on file  .  Attends Archivist Meetings: Not on file  . Marital Status: Not on file  Intimate Partner Violence:   . Fear of Current or Ex-Partner: Not on file  . Emotionally Abused: Not on file  . Physically Abused: Not on file  . Sexually Abused: Not on file     Review of Systems: General: negative for chills, fever, night sweats or weight changes.  Cardiovascular: negative for chest pain, dyspnea on exertion, edema, orthopnea, palpitations, paroxysmal nocturnal dyspnea or shortness of breath Dermatological: negative for rash Respiratory: negative for cough or wheezing Urologic: negative for hematuria Abdominal: negative for nausea, vomiting, diarrhea, bright red blood per rectum, melena, or hematemesis Neurologic: negative for visual changes, syncope, or dizziness All other systems reviewed and are otherwise negative except as noted above.    Blood pressure 100/62, pulse 73, height 6' (1.829 m), weight 209 lb (94.8 kg).  General appearance: alert and no distress Neck: no adenopathy, no carotid bruit, no JVD, supple, symmetrical, trachea midline and thyroid not  enlarged, symmetric, no tenderness/mass/nodules Lungs: clear to auscultation bilaterally Heart: irregularly irregular rhythm Extremities: extremities normal, atraumatic, no cyanosis or edema Pulses: 2+ and symmetric Skin: Skin color, texture, turgor normal. No rashes or lesions Neurologic: Mental status: Alert, oriented, thought content appropriate  EKG atrial fibrillation with ventricular response of 73 with right bundle branch block.  I personally reviewed this EKG.  ASSESSMENT AND PLAN:   Paroxysmal atrial fibrillation (HCC) History of PAF status post remote ablation at [facility] as well as multiple DC cardioversions Gwenlyn Found is been on multiple antiarrhythmic medications as well.  His last cardioversion was performed by Dr. Johnsie Hogan 05/01/2020 which which was successful.  He was seen back in the A. fib clinic shortly thereafter in sinus rhythm.  Today he is back in A. fib but is unaware of that.  He is on Xarelto.  Status post coronary artery bypass grafting x 7, 11/16/1996 History of CAD status post CABG times 73/18/98.  His last Myoview performed 06/07/2013 showed inferolateral scar without ischemia.  He denies chest pain or shortness of breath.  Peripheral artery disease (HCC) History of peripheral arterial disease status post bilateral iliac artery PTA and stenting by myself remotely with restenting in 2007.  He is also had right renal artery PTA and stenting by myself as well.  He complains of left greater than right lower extremity lifestyle limiting claudication with recent Dopplers performed 07/25/2020 revealing right ABI of 0.87 and a left of 0.82.  He has high-frequency signal in his left external iliac artery which has progressed to moderately elevated velocity in the right external iliac artery as well.  He wishes to proceed with angiography potential intervention.  We will schedule this sometime in January.  He will need to hold his Xarelto for 2 days prior to the procedure.  Hyperlipidemia  associated with type 2 diabetes mellitus ([place]) History of hyperlipidemia on statin therapy with lipid profile performed 05/27/2018 revealing total cholesterol 99, LDL 48 and HDL 44.  Hypertension associated with diabetes ([place]) History of essential hypertension a blood pressure measured today 100/62.  He is on lisinopril, hydrochlorothiazide and metoprolol.  Ischemic cardiomyopathy History of ischemic cardiomyopathy with an EF by 2D echo performed 04/24/2020 of 40% without significant valvular abnormalities.  He is on appropriate medications and is asymptomatic.      Richard Harp MD FACP,FACC,FAHA, [facility] 08/08/2020 3:44 PM

## 2020-08-08 NOTE — Assessment & Plan Note (Signed)
History of hyperlipidemia on statin therapy with lipid profile performed 05/27/2018 revealing total cholesterol 99, LDL 48 and HDL 44.

## 2020-08-15 ENCOUNTER — Encounter: Payer: Self-pay | Admitting: Family Medicine

## 2020-08-15 ENCOUNTER — Ambulatory Visit (INDEPENDENT_AMBULATORY_CARE_PROVIDER_SITE_OTHER): Payer: Medicare Other | Admitting: Family Medicine

## 2020-08-15 VITALS — BP 87/41 | HR 72 | Temp 97.6°F | Wt 206.0 lb

## 2020-08-15 DIAGNOSIS — G4700 Insomnia, unspecified: Secondary | ICD-10-CM

## 2020-08-15 DIAGNOSIS — G2581 Restless legs syndrome: Secondary | ICD-10-CM

## 2020-08-15 DIAGNOSIS — I152 Hypertension secondary to endocrine disorders: Secondary | ICD-10-CM

## 2020-08-15 DIAGNOSIS — N1831 Chronic kidney disease, stage 3a: Secondary | ICD-10-CM

## 2020-08-15 DIAGNOSIS — E1122 Type 2 diabetes mellitus with diabetic chronic kidney disease: Secondary | ICD-10-CM

## 2020-08-15 DIAGNOSIS — I2510 Atherosclerotic heart disease of native coronary artery without angina pectoris: Secondary | ICD-10-CM

## 2020-08-15 DIAGNOSIS — M545 Low back pain, unspecified: Secondary | ICD-10-CM

## 2020-08-15 DIAGNOSIS — I2583 Coronary atherosclerosis due to lipid rich plaque: Secondary | ICD-10-CM

## 2020-08-15 DIAGNOSIS — I739 Peripheral vascular disease, unspecified: Secondary | ICD-10-CM

## 2020-08-15 DIAGNOSIS — E1159 Type 2 diabetes mellitus with other circulatory complications: Secondary | ICD-10-CM | POA: Diagnosis not present

## 2020-08-15 DIAGNOSIS — C3411 Malignant neoplasm of upper lobe, right bronchus or lung: Secondary | ICD-10-CM

## 2020-08-15 DIAGNOSIS — G8929 Other chronic pain: Secondary | ICD-10-CM

## 2020-08-15 DIAGNOSIS — I480 Paroxysmal atrial fibrillation: Secondary | ICD-10-CM | POA: Diagnosis not present

## 2020-08-15 MED ORDER — SERTRALINE HCL 50 MG PO TABS: 50.0000 mg | ORAL_TABLET | Freq: Every day | ORAL | 3 refills | Status: DC

## 2020-08-15 MED ORDER — TRAMADOL HCL 50 MG PO TABS: ORAL_TABLET | ORAL | 3 refills | Status: DC

## 2020-08-15 NOTE — Assessment & Plan Note (Signed)
He has hypotensive on both automated and manual blood pressure checks. He denies any symptoms related to this at this time other than fatigue. I will have him hold his lisinopril with hydrochlorothiazide for now. Recommend that he check blood pressures at home. He is instructed to let me know if he develops any further symptoms related to this.

## 2020-08-15 NOTE — Assessment & Plan Note (Signed)
Gabapentin continues to work well for him continue at current strength.

## 2020-08-15 NOTE — Assessment & Plan Note (Signed)
Status post several cardioversions but remains and A. fib. He is rate controlled at this time we will continue metoprolol.

## 2020-08-15 NOTE — Assessment & Plan Note (Signed)
He is currently maintaining control of this with diet. He did have to discontinue Jardiance while taking chemotherapy. We will update his A1c

## 2020-08-15 NOTE — Assessment & Plan Note (Signed)
He will continue trazodone as needed.

## 2020-08-15 NOTE — Assessment & Plan Note (Signed)
Chronic pain is well controlled with tramadol at this time. We will plan to continue current strength.

## 2020-08-15 NOTE — Assessment & Plan Note (Signed)
Status post chemoradiation, has upcoming scan in a couple of weeks. He will continue to be followed by oncology.

## 2020-08-15 NOTE — Assessment & Plan Note (Signed)
History of bilateral iliac artery stents now with recurrent stenosis. Has upcoming angiography with possible intervention with Dr. Gwenlyn Found.

## 2020-08-15 NOTE — Progress Notes (Signed)
[name] - 74 y.o. male MRN [id number]  Date of birth: 1946/02/02  Subjective Chief Complaint  Patient presents with  . Follow-up    HPI [name] is a 74 year old male with a history of hypertension, coronary artery disease status post CABG, persistent A. fib, type 2 diabetes, hyperlipidemia, lung cancer with biopsy-proven SCC. He is status post chemoradiation this past summer, did fairly well with this. He has upcoming scan in a couple of weeks.  He is also seeing Dr. Gwenlyn Found for bilateral stenosis of his iliac arteries. He has had previous stenting to these areas in the past however is having increased claudication symptoms. He has upcoming angiography with possible intervention at that time.  He does remain in atrial fibrillation. He has had multiple DC cardioversions as well as been on several medications. He is rate controlled at this time. He is anticoagulated with Xarelto. He denies any chest pain, shortness of breath or dizziness.  He has been able to manage his diabetes with diet alone.  He does report some increased depressive symptoms. He feels low energy levels, decreased motivation and sadness. He relates this to the medical problems he has experienced over the past year. He has never taken anything for depression in the past other than trazodone for insomnia.  He continues on gabapentin for management of restless legs. This continues to work really well for him.  [name] He does also continue on tramadol as needed for chronic low back pain. He denies any side effects related to this.  ROS:  A comprehensive ROS was completed and negative except as noted per HPI  No Known Allergies  Past Medical History:  Diagnosis Date  . Atypical atrial flutter ([place])   . Cancer [facility])    Testicular- surgery   . COPD (chronic obstructive pulmonary disease) ([place])   . Coronary artery disease   . Diabetes mellitus without complication ([place])   . Diastolic dysfunction, left ventricle 05/2018    Dr. Rayann Heman- Cardiologist  . Dysrhythmia    chronic AFib  . History of kidney stones    passed  . HLD (hyperlipidemia)   . HTN (hypertension)   . Myocardial infarction ([place])    x 2 maybe 1 more  . OSA (obstructive sleep apnea)    uses CPAP  . Peripheral artery disease ([place])    pseudoaneurysm post afib ablation at [facility] 2011, s/p bilateral iliac stents  . Persistent atrial fibrillation ([place])   . Pre-diabetes   . Renal artery stenosis (HCC)    right renal artery PTA and stenting  . S/P coronary artery bypass graft x 7 11/16/96    Past Surgical History:  Procedure Laterality Date  . 2-D echocardiogram  03/25/2010   Normal left ventricular function. Mild MR, TR, trivial AR  . ATRIAL FIBRILLATION ABLATION  03/27/2010, 12/31/2010   [facility], Dr. Nadeen Landau  . BACK SURGERY  2004, 2007   Laminectomy, Discedectomy x 2  . BRONCHIAL BIOPSY  12/27/2019   Procedure: BRONCHIAL BIOPSIES;  Surgeon: Garner Nash, DO;  Location: [place] ENDOSCOPY;  Service: Pulmonary;;  . BRONCHIAL BRUSHINGS  12/27/2019   Procedure: BRONCHIAL BRUSHINGS;  Surgeon: Garner Nash, DO;  Location: [place];  Service: Pulmonary;;  . BRONCHIAL NEEDLE ASPIRATION BIOPSY  12/27/2019   Procedure: BRONCHIAL NEEDLE ASPIRATION BIOPSIES;  Surgeon: Garner Nash, DO;  Location: [facility] ENDOSCOPY;  Service: Pulmonary;;  . cardiac stress test  11/21/2009   Exercise capacity 5 METS. No significant ischemia demonstrated  . CARDIOVERSION N/A 03/15/2015  Procedure: CARDIOVERSION;  Surgeon: Lorretta Harp, MD;  Location: [place];  Service: Cardiovascular;  Laterality: N/A;  . CARDIOVERSION N/A 07/10/2017   Procedure: CARDIOVERSION;  Surgeon: Pixie Casino, MD;  Location: [facility] ENDOSCOPY;  Service: Cardiovascular;  Laterality: N/A;  . CARDIOVERSION N/A 03/23/2018   Procedure: CARDIOVERSION;  Surgeon: Sanda Klein, MD;  Location: [facility] ENDOSCOPY;  Service: Cardiovascular;  Laterality: N/A;  . CARDIOVERSION N/A 05/01/2020   Procedure:  CARDIOVERSION;  Surgeon: Josue Hector, MD;  Location: [facility] ENDOSCOPY;  Service: Cardiovascular;  Laterality: N/A;  . COLONOSCOPY WITH PROPOFOL N/A 04/05/2020   Procedure: COLONOSCOPY WITH PROPOFOL;  Surgeon: Carol Ada, MD;  Location: [facility] ENDOSCOPY;  Service: Endoscopy;  Laterality: N/A;  . CORONARY ARTERY BYPASS GRAFT  1998  . ELECTROMAGNETIC NAVIGATION BROCHOSCOPY  12/27/2019   Procedure: NAVIGATION BRONCHOSCOPY;  Surgeon: Garner Nash, DO;  Location: [place] ENDOSCOPY;  Service: Pulmonary;;  . ESOPHAGOGASTRODUODENOSCOPY (EGD) WITH PROPOFOL N/A 04/05/2020   Procedure: ESOPHAGOGASTRODUODENOSCOPY (EGD) WITH PROPOFOL;  Surgeon: Carol Ada, MD;  Location: [facility] ENDOSCOPY;  Service: Endoscopy;  Laterality: N/A;  . FIDUCIAL MARKER PLACEMENT  12/27/2019   Procedure: FIDUCIAL MARKER PLACEMENT;  Surgeon: Garner Nash, DO;  Location: [place] ENDOSCOPY;  Service: Pulmonary;;  . HOT HEMOSTASIS N/A 04/05/2020   Procedure: HOT HEMOSTASIS (ARGON PLASMA COAGULATION/BICAP);  Surgeon: Carol Ada, MD;  Location: [facility];  Service: Endoscopy;  Laterality: N/A;  . [place]   for cancer  . POLYPECTOMY  04/05/2020   Procedure: POLYPECTOMY;  Surgeon: Carol Ada, MD;  Location: [facility] ENDOSCOPY;  Service: Endoscopy;;  . TOOTH EXTRACTION  05/27/2013   tooth extraction with bone graft- several -for Implants  . VIDEO BRONCHOSCOPY WITH ENDOBRONCHIAL NAVIGATION N/A 12/27/2019   Procedure: VIDEO BRONCHOSCOPY;  Surgeon: Garner Nash, DO;  Location: [place];  Service: Pulmonary;  Laterality: N/A;    Social History   Socioeconomic History  . Marital status: Divorced    Spouse name: Not on file  . Number of children: 2  . Years of education: 32  . Highest education level: Associate degree: academic program  Occupational History  . 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 Smoking status: Former Smoker    Packs/day: 1.50    Years: 30.00    Pack years: 45.00    Types:  Cigarettes, Cigars    Quit date: 09/01/1994    Years since quitting: 25.9  . Smokeless tobacco: Never Used  . Tobacco comment: cigars quit  - 11/16/2019  Vaping Use  . Vaping Use: Never used  Substance and Sexual Activity  . Alcohol use: Not Currently  . Drug use: No  . Sexual activity: Yes  Other Topics Concern  . Not on file  Social History Narrative   Pt lives in [place] alone.  Divorced. 2 cups of coffee in the morning.   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 Cancer Mother   . Cancer Father   . Other Brother        NO MEDICAL PROBLEMS  . Other Son        NO MEDICAL PROBLEMS  . Leukemia Daughter 6  Recover and has no other problems    Health Maintenance  Topic Date Due  . OPHTHALMOLOGY EXAM  09/07/2018  . FOOT EXAM  06/10/2019  . COVID-19 Vaccine (3 - Pfizer risk 4-dose series) 12/05/2019  . HEMOGLOBIN A1C  03/13/2020  . INFLUENZA VACCINE  04/01/2020  . TETANUS/TDAP  07/02/2021  . COLONOSCOPY  04/05/2030  . Hepatitis C Screening  Completed  . PNA vac Low Risk Adult  Completed     ----------------------------------------------------------------------------------------------------------------------------------------------------------------------------------------------------------------- Physical Exam BP (!) 87/41   Pulse 72   Temp 97.6 F (36.4 C)   Wt 206 lb (93.4 kg)   SpO2 98%   BMI 27.94 kg/m   Physical Exam Constitutional:      Appearance: Normal appearance.  Eyes:     General: No scleral icterus. Cardiovascular:     Rate and Rhythm: Normal rate. Rhythm irregular.  Pulmonary:     Effort: Pulmonary effort is normal.     Breath sounds: Normal breath sounds.   Musculoskeletal:     Cervical back: Neck supple.  Skin:    General: Skin is warm and dry.  Neurological:     General: No focal deficit present.     Mental Status: He is alert.  Psychiatric:        Mood and Affect: Mood normal.        Behavior: Behavior normal.     ------------------------------------------------------------------------------------------------------------------------------------------------------------------------------------------------------------------- Assessment and Plan  Hypertension associated with diabetes ([place]) He has hypotensive on both automated and manual blood pressure checks. He denies any symptoms related to this at this time other than fatigue. I will have him hold his lisinopril with hydrochlorothiazide for now. Recommend that he check blood pressures at home. He is instructed to let me know if he develops any further symptoms related to this.  Paroxysmal atrial fibrillation (HCC) Status post several cardioversions but remains and A. fib. He is rate controlled at this time we will continue metoprolol.  Malignant neoplasm of right upper lobe of lung ([place]) Status post chemoradiation, has upcoming scan in a couple of weeks. He will continue to be followed by oncology.  Type 2 diabetes mellitus with diabetic chronic kidney disease ([place]) He is currently maintaining control of this with diet. He did have to discontinue Jardiance while taking chemotherapy. We will update his A1c  Insomnia He will continue trazodone as needed.  RLS (restless legs syndrome) Gabapentin continues to work well for him continue at current strength.  Chronic back pain Chronic pain is well controlled with tramadol at this time. We will plan to continue current strength.  Peripheral artery disease (HCC) History of bilateral iliac artery stents now with recurrent stenosis. Has upcoming angiography with possible intervention with Dr. Gwenlyn Found.   Meds ordered this encounter   Medications  . traMADol (ULTRAM) 50 MG tablet    Sig: TAKE 1 TABLET(50 MG) BY MOUTH EVERY 12 HOURS AS NEEDED    Dispense:  30 tablet    Refill:  3  . sertraline (ZOLOFT) 50 MG tablet    Sig: Take 1 tablet (50 mg total) by mouth daily. Take 1/2 tab x1 week then increase to full tab    Dispense:  30 tablet    Refill:  3    Return in about 6 weeks (around 09/26/2020) for Mood.    This visit occurred during the SARS-CoV-2 public health emergency.  Safety protocols were in place, including screening questions prior to the visit, additional usage of staff PPE, and extensive cleaning of exam room while  observing appropriate contact time as indicated for disinfecting solutions.

## 2020-08-15 NOTE — Patient Instructions (Addendum)
Great to see you today! Let's have you hold the lisinopril/hctz for now.  Let's add sertraline for mood.  Start 1/2 tab for the first week then increase to a full tab.  Follow up with me in 4-6 weeks.

## 2020-08-16 NOTE — Progress Notes (Signed)
[facility] OFFICE PROGRESS NOTE  Luetta Nutting, DO [place]  [address] [place] [address]  DIAGNOSIS: Limited stage, stage Ia (T1b, N0, M0) small cell lung cancer presented with right upper lobe pulmonary nodule that is hypermetabolic and biopsy proven to be consistent with small cell carcinoma diagnosed in April 2021.  PRIOR THERAPY:  1) Concurrent chemoradiation with carboplatin for an AUC of 5 on days 1 and etoposide 100 mg per metered squared on days 1, 2, and 3 IV every 3 weeks. Last chemotherapy 03/20/20.Status post 4 cycles. Last dose ofSBRT June 2021. 2) prophylactic cranial irradiation under the care of Dr. Lisbeth Renshaw completed on 05/28/2020.  CURRENT THERAPY: Observation  INTERVAL HISTORY: [name] 74 y.o. male returns to the clinic today for a follow-up visit.  The patient is feeling well today without any concerning complaints except fatigue. The patient was diagnosed with limited stage small cell lung cancer in April 2021.  The patient completed concurrent chemoradiation in July 2021.  He has been on observation since that time and feeling fairly well. He notes he had some challenges with PCI with nausea and balance issues which have since improved. He also had visual changes which improved as well. He recently had a follow up appointment with his PCP. The patient has chronic atrial fibrillation for which he is on xarelto. He had a history of a GI bleed in August 2021 and was hospitalized. He is followed by Dr. Collene Mares. The patient states that he has struggled with anemia for 10 years. He just restarted his iron supplements a few days ago after having his follow up with his PCP. He denies any abnormal bleeding. He states he checks his stool for blood. He does not want a blood transfusion.   Otherwise, he denies any fever, chills, or night sweats.  His weight is stable but he notes decreased desire to eat. He denies any shortness of breath, cough,  chest pain, or hemoptysis.  He denies any nausea, vomiting, diarrhea, or constipation.  He denies any headache or visual changes.  The patient recently had a restaging CT scan performed.  The patient is here today for evaluation and to review his scan results.    MEDICAL HISTORY: Past Medical History:  Diagnosis Date  . Atypical atrial flutter ([place])   . Cancer [facility])    Testicular- surgery   . COPD (chronic obstructive pulmonary disease) ([place])   . Coronary artery disease   . Diabetes mellitus without complication ([place])   . Diastolic dysfunction, left ventricle 05/2018   Dr. Rayann Heman- Cardiologist  . Dysrhythmia    chronic AFib  . History of kidney stones    passed  . HLD (hyperlipidemia)   . HTN (hypertension)   . Myocardial infarction ([facility])    x 2 maybe 1 more  . OSA (obstructive sleep apnea)    uses CPAP  . Peripheral artery disease ([place])    pseudoaneurysm post afib ablation at [facility] 2011, s/p bilateral iliac stents  . Persistent atrial fibrillation ([place])   . Pre-diabetes   . Renal artery stenosis ([facility])    right renal artery PTA and stenting  . S/P coronary artery bypass graft x 7 11/16/96    ALLERGIES:  has No Known Allergies.  MEDICATIONS:  Current Outpatient Medications  Medication Sig Dispense Refill  . acetaminophen (TYLENOL) 500 MG tablet Take 1,000 mg by mouth as needed for mild pain or moderate pain (for pain.).     [name]  atorvastatin (LIPITOR) 10 MG tablet Take 1 tablet (10 mg total) by mouth daily. 90 tablet 3  . budesonide-formoterol (SYMBICORT) 160-4.5 MCG/ACT inhaler Inhale 2 puffs into the lungs 2 (two) times daily. 10.2 g 3  . gabapentin (NEURONTIN) 300 MG capsule TAKE 2 CAPSULES(600 MG) BY MOUTH AT BEDTIME 180 capsule 2  . ketotifen (ZADITOR) 0.025 % ophthalmic solution Place 1 drop into both eyes 2 (two) times daily as needed (allergies).    . metoprolol succinate (TOPROL-XL) 100 MG 24 hr tablet TAKE 125MG  (1 AND 1/4 TAB) DAILY (Patient taking differently: Take  100 mg by mouth daily. TAKE 100 MG DAILY) 90 tablet 3  . Multiple Vitamin (MULTIVITAMIN WITH MINERALS) TABS tablet Take 1 tablet by mouth daily. Centrum silver    . niacin (NIASPAN) 1000 MG CR tablet TAKE 1 TABLET(1000 MG) BY MOUTH AT BEDTIME 90 tablet 3  . nicotine polacrilex (COMMIT) 2 MG lozenge Take 2 mg by mouth daily as needed for smoking cessation.     . nitroGLYCERIN (NITROSTAT) 0.4 MG SL tablet Place 1 tablet (0.4 mg total) under the tongue every 5 (five) minutes as needed. 25 tablet 3  . NON FORMULARY CPAP at night    . ondansetron (ZOFRAN) 8 MG tablet Take 1 tablet (8 mg total) by mouth every 8 (eight) hours as needed for nausea or vomiting. 30 tablet 1  . oxymetazoline (AFRIN) 0.05 % nasal spray Place 1 spray into both nostrils daily as needed for congestion.     . prochlorperazine (COMPAZINE) 10 MG tablet TAKE 1 TABLET(10 MG) BY MOUTH EVERY 6 HOURS AS NEEDED FOR NAUSEA OR VOMITING 30 tablet 0  . sertraline (ZOLOFT) 50 MG tablet Take 1 tablet (50 mg total) by mouth daily. Take 1/2 tab x1 week then increase to full tab 30 tablet 3  . traMADol (ULTRAM) 50 MG tablet TAKE 1 TABLET(50 MG) BY MOUTH EVERY 12 HOURS AS NEEDED 30 tablet 3  . traZODone (DESYREL) 50 MG tablet TAKE 1/2 TO 1 TABLET BY MOUTH AT BEDTIME AS NEEDED FOR SLEEP 90 tablet 3  . VENTOLIN HFA 108 (90 Base) MCG/ACT inhaler INHALE 2 PUFFS INTO THE LUNGS EVERY 6 HOURS AS NEEDED FOR WHEEZING OR SHORTNESS OF BREATH 18 g 1  . XARELTO 20 MG TABS tablet TAKE 1 TABLET BY MOUTH EVERY DAY WITH SUPPER 90 tablet 1  . pantoprazole (PROTONIX) 40 MG tablet Take 1 tablet (40 mg total) by mouth daily. 30 tablet 2   Current Facility-Administered Medications  Medication Dose Route Frequency Provider Last Rate Last Admin  . sodium chloride flush (NS) 0.9 % injection 3 mL  3 mL Intravenous Q12H Lorretta Harp, MD        SURGICAL HISTORY:  Past Surgical History:  Procedure Laterality Date  . 2-D echocardiogram  03/25/2010   Normal left  ventricular function. Mild MR, TR, trivial AR  . ATRIAL FIBRILLATION ABLATION  03/27/2010, 12/31/2010   [facility], Dr. Nadeen Landau  . BACK SURGERY  2004, 2007   Laminectomy, Discedectomy x 2  . BRONCHIAL BIOPSY  12/27/2019   Procedure: BRONCHIAL BIOPSIES;  Surgeon: Garner Nash, DO;  Location: [facility];  Service: Pulmonary;;  . BRONCHIAL BRUSHINGS  12/27/2019   Procedure: BRONCHIAL BRUSHINGS;  Surgeon: Garner Nash, DO;  Location: [facility];  Service: Pulmonary;;  . BRONCHIAL NEEDLE ASPIRATION BIOPSY  12/27/2019   Procedure: BRONCHIAL NEEDLE ASPIRATION BIOPSIES;  Surgeon: Garner Nash, DO;  Location: [facility];  Service: Pulmonary;;  . cardiac stress test  11/21/2009   Exercise capacity 5 METS. No significant ischemia demonstrated  . CARDIOVERSION N/A 03/15/2015   Procedure: CARDIOVERSION;  Surgeon: Lorretta Harp, MD;  Location: [facility];  Service: Cardiovascular;  Laterality: N/A;  . CARDIOVERSION N/A 07/10/2017   Procedure: CARDIOVERSION;  Surgeon: Pixie Casino, MD;  Location: [facility] ENDOSCOPY;  Service: Cardiovascular;  Laterality: N/A;  . CARDIOVERSION N/A 03/23/2018   Procedure: CARDIOVERSION;  Surgeon: Sanda Klein, MD;  Location: [facility]COPY;  Service: Cardiovascular;  Laterality: N/A;  . CARDIOVERSION N/A 05/01/2020   Procedure: CARDIOVERSION;  Surgeon: Josue Hector, MD;  Location: [facility] ENDOSCOPY;  Service: Cardiovascular;  Laterality: N/A;  . COLONOSCOPY WITH PROPOFOL N/A 04/05/2020   Procedure: COLONOSCOPY WITH PROPOFOL;  Surgeon: Carol Ada, MD;  Location: [facility] ENDOSCOPY;  Service: Endoscopy;  Laterality: N/A;  . CORONARY ARTERY BYPASS GRAFT  1998  . ELECTROMAGNETIC NAVIGATION BROCHOSCOPY  12/27/2019   Procedure: NAVIGATION BRONCHOSCOPY;  Surgeon: Garner Nash, DO;  Location: [facility];  Service: Pulmonary;;  . ESOPHAGOGASTRODUODENOSCOPY (EGD) WITH PROPOFOL N/A 04/05/2020   Procedure: ESOPHAGOGASTRODUODENOSCOPY (EGD) WITH PROPOFOL;  Surgeon: Carol Ada, MD;   Location: [facility];  Service: Endoscopy;  Laterality: N/A;  . FIDUCIAL MARKER PLACEMENT  12/27/2019   Procedure: FIDUCIAL MARKER PLACEMENT;  Surgeon: Garner Nash, DO;  Location: [facility];  Service: Pulmonary;;  . HOT HEMOSTASIS N/A 04/05/2020   Procedure: HOT HEMOSTASIS (ARGON PLASMA COAGULATION/BICAP);  Surgeon: Carol Ada, MD;  Location: [facility];  Service: Endoscopy;  Laterality: N/A;  . Outlook   for cancer  . POLYPECTOMY  04/05/2020   Procedure: POLYPECTOMY;  Surgeon: Carol Ada, MD;  Location: [facility];  Service: Endoscopy;;  . TOOTH EXTRACTION  05/27/2013   tooth extraction with bone graft- several -for Implants  . VIDEO BRONCHOSCOPY WITH ENDOBRONCHIAL NAVIGATION N/A 12/27/2019   Procedure: VIDEO BRONCHOSCOPY;  Surgeon: Garner Nash, DO;  Location: [facility];  Service: Pulmonary;  Laterality: N/A;    REVIEW OF SYSTEMS:   Review of Systems  Constitutional: Positive for fatigue and appetite change. Negative for chills, fever and unexpected weight change.  HENT: Negative for mouth sores, nosebleeds, sore throat and trouble swallowing.   Eyes: Negative for eye problems and icterus.  Respiratory: Negative for cough, hemoptysis, shortness of breath and wheezing.   Cardiovascular: Negative for chest pain and leg swelling.  Gastrointestinal:  Negative for abdominal pain, nausea, constipation, diarrhea, and vomiting.  Genitourinary: Negative for bladder incontinence, difficulty urinating, dysuria, frequency and hematuria.   Musculoskeletal: Negative for back pain, gait problem, neck pain and neck stiffness.  Skin: Negative for itching and rash.  Neurological: Negative for dizziness, extremity weakness, gait problem, headaches, light-headedness and seizures.  Hematological: Negative for adenopathy. Does not bruise/bleed easily.  Psychiatric/Behavioral: Negative for confusion, depression and sleep disturbance. The patient is not nervous/anxious.      PHYSICAL EXAMINATION:  Blood pressure (!) 115/57, pulse 71, temperature 97.9 F (36.6 C), temperature source Tympanic, resp. rate 15, height 6' (1.829 m), weight 205 lb 6.4 oz (93.2 kg), SpO2 99 %.  ECOG PERFORMANCE STATUS: 1 - Symptomatic but completely ambulatory  Physical Exam  Constitutional: Oriented to person, place, and time and well-developed, well-nourished, and in no distress.  HENT:  Head: Normocephalic and atraumatic.  Mouth/Throat: Oropharynx is clear and moist. No oropharyngeal exudate.  Eyes: Conjunctivae are normal. Right eye exhibits no discharge. Left eye exhibits no discharge. No scleral icterus.  Neck: Normal range of motion. Neck supple.  Cardiovascular: Normal rate, irregular rhythm, normal  heart sounds and intact distal pulses.   Pulmonary/Chest: Effort normal and breath sounds normal. No respiratory distress. No wheezes. No rales.  Abdominal: Soft. Bowel sounds are normal. Exhibits no distension and no mass. There is no tenderness.  Musculoskeletal: Normal range of motion. Exhibits no edema.  Lymphadenopathy:    No cervical adenopathy.  Neurological: Alert and oriented to person, place, and time. Exhibits normal muscle tone. Gait normal. Coordination normal.  Skin: Skin is warm and dry. No rash noted. Not diaphoretic. No erythema. No pallor.  Psychiatric: Mood, memory and judgment normal.  Vitals reviewed.  LABORATORY DATA: Lab Results  Component Value Date   WBC 6.0 08/20/2020   HGB 8.0 (L) 08/20/2020   HCT 28.8 (L) 08/20/2020   MCV 83.0 08/20/2020   PLT 146 (L) 08/20/2020      Chemistry      Component Value Date/Time   NA 142 08/20/2020 1017   NA 142 04/18/2010 0000   K 4.8 08/20/2020 1017   CL 107 08/20/2020 1017   CO2 26 08/20/2020 1017   BUN 13 08/20/2020 1017   CREATININE 1.24 08/20/2020 1017   CREATININE 1.41 (H) 09/14/2019 0943      Component Value Date/Time   CALCIUM 9.4 08/20/2020 1017   ALKPHOS 64 08/20/2020 1017   AST 15  08/20/2020 1017   ALT 10 08/20/2020 1017   BILITOT 0.8 08/20/2020 1017       RADIOGRAPHIC STUDIES:  CT Chest W Contrast  Result Date: 08/20/2020 CLINICAL DATA:  Small-cell lung cancer restaging EXAM: CT CHEST WITH CONTRAST TECHNIQUE: Multidetector CT imaging of the chest was performed during intravenous contrast administration. CONTRAST:  51mL OMNIPAQUE IOHEXOL 300 MG/ML  SOLN COMPARISON:  04/18/2020, 12/19/2019 FINDINGS: Cardiovascular: Aortic atherosclerosis. Normal heart size. Extensive 3 vessel coronary artery calcifications and/or stents. No pericardial effusion. Mediastinum/Nodes: No enlarged mediastinal, hilar, or axillary lymph nodes. Thyroid gland, trachea, and esophagus demonstrate no significant findings. Lungs/Pleura: Interval development of heterogeneous and ground-glass airspace opacity in the medial posterior right pulmonary apex adjacent to a biopsy marking clip. Previously noted pulmonary nodule is difficult to discretely appreciate within this airspace opacity but is not significantly changed, measuring approximately 5 mm (series 7, image 44). Mild centrilobular emphysema and diffuse bilateral bronchial wall thickening. No pleural effusion or pneumothorax. Upper Abdomen: No acute abnormality. Numerous gallstones in the gallbladder. Musculoskeletal: No chest wall mass or suspicious bone lesions identified. IMPRESSION: 1. Interval development of heterogeneous and ground-glass airspace opacity in the medial posterior right pulmonary apex adjacent to a biopsy marking clip, consistent with developing radiation fibrosis. 2. Previously noted pulmonary nodule is difficult to discretely appreciate within this airspace opacity but is not significantly changed, measuring approximately 5 mm, consistent with sustained treatment response. 3. Mild centrilobular emphysema and diffuse bilateral bronchial wall thickening. 4. Coronary artery disease. 5. Cholelithiasis. Aortic Atherosclerosis (ICD10-I70.0)  and Emphysema (ICD10-J43.9). Electronically Signed   By: Eddie Candle M.D.   On: 08/20/2020 14:25   VAS [place] ABI WITH/WO TBI  Result Date: 07/25/2020 LOWER EXTREMITY DOPPLER STUDY Indications: Claudication, peripheral artery disease, and Patient says he feels              he is lacking stamina since going through radiation and              chemotherapy for small cell lung cancer. He does have an aching in              his left thigh. He states he would not be able  to walk for long due              to feeling weak. He denies rest pain. High Risk Factors: Hypertension, hyperlipidemia, past history of smoking,                    coronary artery disease. Other Factors: SEE AORTOILIAC DUPLEX REPORT                 Patient has an arrhythmia.  Vascular Interventions: Bilateral iliac arterial PTA with stents placed 2007. Comparison Study: Prior ABI 07/19/2019 Right .93 Left .8 Performing Technologist: Salvadore Dom RVT  Examination Guidelines: A complete evaluation includes at minimum, Doppler waveform signals and systolic blood pressure reading at the level of bilateral brachial, anterior tibial, and posterior tibial arteries, when vessel segments are accessible. Bilateral testing is considered an integral part of a complete examination. Photoelectric Plethysmograph (PPG) waveforms and toe systolic pressure readings are included as required and additional duplex testing as needed. Limited examinations for reoccurring indications may be performed as noted.  ABI Findings: +---------+------------------+-----+---------+--------+ Right    Rt Pressure (mmHg)IndexWaveform Comment  +---------+------------------+-----+---------+--------+ Brachial 98                                       +---------+------------------+-----+---------+--------+ ATA      88                0.86 triphasic         +---------+------------------+-----+---------+--------+ PTA      89                0.87 triphasic          +---------+------------------+-----+---------+--------+ PERO     81                0.79 biphasic          +---------+------------------+-----+---------+--------+ Great Toe53                0.52 Abnormal          +---------+------------------+-----+---------+--------+ +---------+------------------+-----+---------+-------+ Left     Lt Pressure (mmHg)IndexWaveform Comment +---------+------------------+-----+---------+-------+ Brachial 102                                     +---------+------------------+-----+---------+-------+ ATA      81                0.79 triphasic        +---------+------------------+-----+---------+-------+ PTA      75                0.74 triphasic        +---------+------------------+-----+---------+-------+ PERO     84                0.82 biphasic         +---------+------------------+-----+---------+-------+ Great Toe67                0.66 Abnormal         +---------+------------------+-----+---------+-------+ +-------+-----------+-----------+------------+------------+ ABI/TBIToday's ABIToday's TBIPrevious ABIPrevious TBI +-------+-----------+-----------+------------+------------+ Right  .87        .52        .93         .47          +-------+-----------+-----------+------------+------------+ Left   .82        .66        .  88         .72          +-------+-----------+-----------+------------+------------+  Bilateral ABIs and TBIs appear essentially unchanged compared to prior study on 07/19/2019.  Summary: Right: Resting right ankle-brachial index indicates mild right lower extremity arterial disease. The right toe-brachial index is abnormal. Left: Resting left ankle-brachial index indicates mild left lower extremity arterial disease. The left toe-brachial index is abnormal.  *See table(s) above for measurements and observations.  Suggest follow up study in 12 months. Electronically signed by Quay Burow MD on 07/25/2020 at  12:19:04 PM.    Final    VAS US AORTA/IVC/ILIACS  Result Date: 07/25/2020 ABDOMINAL AORTA STUDY Indications: Patient says he feels he is lacking stamina since going through              radiation and chemotherapy for small cell lung cancer. He does have              an aching in his left thigh. He states he would not be able to walk              for long due to feeling weak. He denies rest pain Risk Factors: Hypertension, hyperlipidemia, past history of smoking, coronary               artery disease. Other Factors: SEE ABI REPORT                 Today's ABI                Right .87 Left .82. Vascular Interventions: Bilateral iliac arterial PTA with stents placed 2007. Limitations: Air/bowel gas and obesity.  Comparison Study: Prior aortoiliac duplex exam on 07/19/2019 showed highest                   velocities in right CIA 545 cm/s, right EIA 258 cm/s. Left CIA                   277 cm/s and left EIA 289 cm/s. Performing Technologist: Salvadore Dom RVT, RDCS (AE), RDMS  Examination Guidelines: A complete evaluation includes B-mode imaging, spectral Doppler, color Doppler, and power Doppler as needed of all accessible portions of each vessel. Bilateral testing is considered an integral part of a complete examination. Limited examinations for reoccurring indications may be performed as noted.  Abdominal Aorta Findings: +-------------+-------+----------+---------+----------+--------+---------------+ Location     AP (cm)Trans (cm)PSV      Waveform  ThrombusComments                                      (cm/s)                                     +-------------+-------+----------+---------+----------+--------+---------------+ Proximal     2.40   2.00      89       biphasic                          +-------------+-------+----------+---------+----------+--------+---------------+ Distal       2.40             65       monophasic                         +-------------+-------+----------+---------+----------+--------+---------------+  RT CIA Prox  1.4              98       biphasic          ectatic         +-------------+-------+----------+---------+----------+--------+---------------+ RT CIA Mid                    158      monophasic                        +-------------+-------+----------+---------+----------+--------+---------------+ RT CIA Distal                 254      monophasic        > 50 % stenosis +-------------+-------+----------+---------+----------+--------+---------------+ RT EIA Prox                   254      monophasic        > 50 % stenosis +-------------+-------+----------+---------+----------+--------+---------------+ RT EIA Mid                    336      monophasic        > 50 % stenosis +-------------+-------+----------+---------+----------+--------+---------------+ RT EIA Distal                 160      monophasic                        +-------------+-------+----------+---------+----------+--------+---------------+ LT CIA Prox  1.4              62       monophasic        ectatic         +-------------+-------+----------+---------+----------+--------+---------------+ LT CIA Mid                    76       monophasic                        +-------------+-------+----------+---------+----------+--------+---------------+ LT CIA Distal                 92       monophasic                        +-------------+-------+----------+---------+----------+--------+---------------+ LT EIA Prox                   496      monophasic        > 50 % stenosis +-------------+-------+----------+---------+----------+--------+---------------+ LT EIA Mid                    225      monophasic        > 50 % stenosis +-------------+-------+----------+---------+----------+--------+---------------+ LT EIA Distal                 172      monophasic                         +-------------+-------+----------+---------+----------+--------+---------------+ Visualization of the Left EIA Mid artery was limited. There is shadowing at the left distal CIA and proximal EIA segment, unable to determine higher grade stenosis versus occlusion. Right proximal CFA 124 cm/s, biphasic flow. Left proximal CFA 163 cm/s monophasic flow. Unable to duplicate right distal CIA and left proximal and  mid CIA velocities from prior exam.  Summary: Abdominal Aorta: No evidence of an abdominal aortic aneurysm was visualized. The largest aortic measurement is 2.4 cm. Stenosis: +--------------------+-------------+ Location            Stenosis      +--------------------+-------------+ Right Common Iliac  >50% stenosis +--------------------+-------------+ Right External Iliac>50% stenosis +--------------------+-------------+ Left External Iliac >50% stenosis +--------------------+-------------+ Stent struts not seen. Moderate to severe atherosclerosis noted throughout aorta and bilateral iliac arteries. IVC/Iliac: There is no evidence of thrombus involving the IVC.  *See table(s) above for measurements and observations. Suggest follow up in 12 months. Patient scheduled to see Dr. Gwenlyn Found on 08/08/2020 at 2:45 pm. Vascular consult recommended.  Electronically signed by Quay Burow MD on 07/25/2020 at 12:18:47 PM.    Final      ASSESSMENT/PLAN:  This is a very pleasant 74 year old Caucasian male diagnosed with limited stage (T1b, N0, M0)non-small cell lung cancer. He presented with a right upper lobe pulmonary nodule that was hypermetabolic on PET scan. It was biopsied and proven to be consistent with small cell lung cancer. He was diagnosed in April 2021.  The patient recently completed concurrent chemoradiation with carboplatin for an AUC of 5 on day 1 and etoposide 100 mg per metered squared on days 1, 2, and 3 IV every 3 weeks.The patient was not a good candidate for cisplatin due to his  renal insufficiency and other comorbidities. The patient is status post4cycles Last dose 03/20/20.   He completedSBRT in June 2021under the care of Dr. Lisbeth Renshaw.   He completed prophylactic cranial irradiation from 05/15/20-05/28/20  The patient recently had a restaging CT scan performed.  Dr. Julien Nordmann personally and independently reviewed the scan and discussed the results with the patient today.  The scan did not show any evidence for disease progression. The scan noted developing radiation fibrosis and stable pulmonary nodule.    Dr. Earlie Server recommends that the patient continue on observation with a restaging scan in 3 months.  Regarding his anemia/fatigue, the patient notes persistent anemia for several years since being on his blood thinner. His Hbg was 8.0 earlier this week. We highly encouraged the patient to follow up with GI to ensure that there is no GI blood loss. He is not interested in a blood transfusion at this time. He restarted his oral iron supplements a few days ago and will continue to take this for his anemia.   The patient was advised to call immediately if he has any concerning symptoms in the interval. The patient voices understanding of current disease status and treatment options and is in agreement with the current care plan. All questions were answered. The patient knows to call the clinic with any problems, questions or concerns. We can certainly see the patient much sooner if necessary   Orders Placed This Encounter  Procedures  . CT Chest W Contrast    Standing Status:   Future    Standing Expiration Date:   08/22/2021    Order Specific Question:   If indicated for the ordered procedure, I authorize the administration of contrast media per Radiology protocol    Answer:   Yes    Order Specific Question:   Preferred imaging location?    Answer:   [facility]  . CBC with Differential ([place] Only)    Standing Status:   Future    Standing  Expiration Date:   08/22/2021  . CMP ([place] only)    Standing Status:  Future    Standing Expiration Date:   08/22/2021     Tobe Sos Collene Massimino, PA-C 08/22/20  ADDENDUM: Hematology/Oncology Attending: I had a face-to-face encounter with the patient today.  I recommended his care plan.  This is a very pleasant 74 years old white male with limited stage small cell lung cancer status post a course of concurrent chemoradiation with carboplatin and etoposide for 4 cycles.  He received SBRT to the single pulmonary nodule as a radiation modality.  He also had prophylactic cranial irradiation. The patient is feeling fine except for fatigue secondary to persistent anemia.  He is followed by gastroenterology Dr. Collene Mares.  He had some gastrointestinal bleeding problem in the past.  I recommended for the patient to keep his appointment with Dr. Collene Mares for further evaluation. He had repeat CT scan of the chest performed recently.  I personally and independently reviewed the scans and discussed the results with the patient today. His scan showed no concerning findings for disease recurrence or metastasis. I recommended for the patient to continue on observation with repeat CT scan of the chest in 3 months. He was advised to call immediately if he has any other concerning symptoms in the interval.  Disclaimer: This note was dictated with voice recognition software. Similar sounding words can inadvertently be transcribed and may be missed upon review. Eilleen Kempf, MD 08/22/20

## 2020-08-20 ENCOUNTER — Ambulatory Visit (HOSPITAL_COMMUNITY)
Admission: RE | Admit: 2020-08-20 | Discharge: 2020-08-20 | Disposition: A | Discharge status: Home / Self Care | Payer: Medicare Other | Source: Ambulatory Visit | Attending: Physician Assistant | Admitting: Physician Assistant

## 2020-08-20 ENCOUNTER — Inpatient Hospital Stay: Payer: Medicare Other | Attending: Physician Assistant

## 2020-08-20 ENCOUNTER — Other Ambulatory Visit: Payer: Self-pay

## 2020-08-20 DIAGNOSIS — Z7901 Long term (current) use of anticoagulants: Secondary | ICD-10-CM | POA: Diagnosis not present

## 2020-08-20 DIAGNOSIS — C3411 Malignant neoplasm of upper lobe, right bronchus or lung: Secondary | ICD-10-CM | POA: Insufficient documentation

## 2020-08-20 DIAGNOSIS — I701 Atherosclerosis of renal artery: Secondary | ICD-10-CM | POA: Diagnosis not present

## 2020-08-20 DIAGNOSIS — C3491 Malignant neoplasm of unspecified part of right bronchus or lung: Secondary | ICD-10-CM | POA: Diagnosis not present

## 2020-08-20 DIAGNOSIS — N1831 Chronic kidney disease, stage 3a: Secondary | ICD-10-CM | POA: Diagnosis not present

## 2020-08-20 DIAGNOSIS — I252 Old myocardial infarction: Secondary | ICD-10-CM | POA: Insufficient documentation

## 2020-08-20 DIAGNOSIS — I2510 Atherosclerotic heart disease of native coronary artery without angina pectoris: Secondary | ICD-10-CM | POA: Insufficient documentation

## 2020-08-20 DIAGNOSIS — Z79899 Other long term (current) drug therapy: Secondary | ICD-10-CM | POA: Diagnosis not present

## 2020-08-20 DIAGNOSIS — G4730 Sleep apnea, unspecified: Secondary | ICD-10-CM | POA: Diagnosis not present

## 2020-08-20 DIAGNOSIS — I4892 Unspecified atrial flutter: Secondary | ICD-10-CM | POA: Insufficient documentation

## 2020-08-20 DIAGNOSIS — I739 Peripheral vascular disease, unspecified: Secondary | ICD-10-CM | POA: Insufficient documentation

## 2020-08-20 DIAGNOSIS — E1122 Type 2 diabetes mellitus with diabetic chronic kidney disease: Secondary | ICD-10-CM | POA: Diagnosis not present

## 2020-08-20 DIAGNOSIS — I2583 Coronary atherosclerosis due to lipid rich plaque: Secondary | ICD-10-CM | POA: Diagnosis not present

## 2020-08-20 DIAGNOSIS — J449 Chronic obstructive pulmonary disease, unspecified: Secondary | ICD-10-CM | POA: Insufficient documentation

## 2020-08-20 DIAGNOSIS — E785 Hyperlipidemia, unspecified: Secondary | ICD-10-CM | POA: Diagnosis not present

## 2020-08-20 DIAGNOSIS — I700 Atherosclerosis of aorta: Secondary | ICD-10-CM | POA: Insufficient documentation

## 2020-08-20 DIAGNOSIS — E119 Type 2 diabetes mellitus without complications: Secondary | ICD-10-CM | POA: Diagnosis not present

## 2020-08-20 DIAGNOSIS — Z87442 Personal history of urinary calculi: Secondary | ICD-10-CM | POA: Diagnosis not present

## 2020-08-20 DIAGNOSIS — I10 Essential (primary) hypertension: Secondary | ICD-10-CM | POA: Diagnosis not present

## 2020-08-20 DIAGNOSIS — J432 Centrilobular emphysema: Secondary | ICD-10-CM | POA: Diagnosis not present

## 2020-08-20 DIAGNOSIS — C3490 Malignant neoplasm of unspecified part of unspecified bronchus or lung: Secondary | ICD-10-CM | POA: Diagnosis not present

## 2020-08-20 LAB — CBC WITH DIFFERENTIAL (CANCER CENTER ONLY)
Abs Immature Granulocytes: 0.01 10*3/uL (ref 0.00–0.07)
Basophils Absolute: 0 10*3/uL (ref 0.0–0.1)
Basophils Relative: 0 %
Eosinophils Absolute: 0.2 10*3/uL (ref 0.0–0.5)
Eosinophils Relative: 4 %
HCT: 28.8 % — ABNORMAL LOW (ref 39.0–52.0)
Hemoglobin: 8 g/dL — ABNORMAL LOW (ref 13.0–17.0)
Immature Granulocytes: 0 %
Lymphocytes Relative: 26 %
Lymphs Abs: 1.6 10*3/uL (ref 0.7–4.0)
MCH: 23.1 pg — ABNORMAL LOW (ref 26.0–34.0)
MCHC: 27.8 g/dL — ABNORMAL LOW (ref 30.0–36.0)
MCV: 83 fL (ref 80.0–100.0)
Monocytes Absolute: 0.6 10*3/uL (ref 0.1–1.0)
Monocytes Relative: 10 %
Neutro Abs: 3.5 10*3/uL (ref 1.7–7.7)
Neutrophils Relative %: 60 %
Platelet Count: 146 10*3/uL — ABNORMAL LOW (ref 150–400)
RBC: 3.47 MIL/uL — ABNORMAL LOW (ref 4.22–5.81)
RDW: 19.2 % — ABNORMAL HIGH (ref 11.5–15.5)
WBC Count: 6 10*3/uL (ref 4.0–10.5)
nRBC: 0 % (ref 0.0–0.2)

## 2020-08-20 LAB — CMP (CANCER CENTER ONLY)
ALT: 10 U/L (ref 0–44)
AST: 15 U/L (ref 15–41)
Albumin: 3.5 g/dL (ref 3.5–5.0)
Alkaline Phosphatase: 64 U/L (ref 38–126)
Anion gap: 9 (ref 5–15)
BUN: 13 mg/dL (ref 8–23)
CO2: 26 mmol/L (ref 22–32)
Calcium: 9.4 mg/dL (ref 8.9–10.3)
Chloride: 107 mmol/L (ref 98–111)
Creatinine: 1.24 mg/dL (ref 0.61–1.24)
GFR, Estimated: >60 mL/min (ref >60)
Glucose, Bld: 132 mg/dL — ABNORMAL HIGH (ref 70–99)
Potassium: 4.8 mmol/L (ref 3.5–5.1)
Sodium: 142 mmol/L (ref 135–145)
Total Bilirubin: 0.8 mg/dL (ref 0.3–1.2)
Total Protein: 6.5 g/dL (ref 6.5–8.1)

## 2020-08-20 MED ORDER — IOHEXOL 300 MG/ML  SOLN
75.0000 mL | Freq: Once | INTRAMUSCULAR | Status: AC | PRN
  Administered 2020-08-20: 75 mL via INTRAVENOUS

## 2020-08-21 LAB — LIPID PANEL
Cholesterol: 97 mg/dL (ref <200)
HDL: 53 mg/dL (ref >=40)
LDL Cholesterol (Calc): 28 mg/dL (calc)
Non-HDL Cholesterol (Calc): 44 mg/dL (calc) (ref <130)
Total CHOL/HDL Ratio: 1.8 (calc) (ref <5.0)
Triglycerides: 77 mg/dL (ref <150)

## 2020-08-21 LAB — HEMOGLOBIN A1C
Hgb A1c MFr Bld: 6.1 % of total Hgb — ABNORMAL HIGH (ref <5.7)
Mean Plasma Glucose: 128 mg/dL
eAG (mmol/L): 7.1 mmol/L

## 2020-08-22 ENCOUNTER — Other Ambulatory Visit: Payer: Self-pay

## 2020-08-22 ENCOUNTER — Encounter: Payer: Self-pay | Admitting: Physician Assistant

## 2020-08-22 ENCOUNTER — Inpatient Hospital Stay (HOSPITAL_BASED_OUTPATIENT_CLINIC_OR_DEPARTMENT_OTHER): Payer: Medicare Other | Admitting: Physician Assistant

## 2020-08-22 VITALS — BP 115/57 | HR 71 | Temp 97.9°F | Resp 15 | Ht 72.0 in | Wt 205.4 lb

## 2020-08-22 DIAGNOSIS — I700 Atherosclerosis of aorta: Secondary | ICD-10-CM | POA: Diagnosis not present

## 2020-08-22 DIAGNOSIS — I2510 Atherosclerotic heart disease of native coronary artery without angina pectoris: Secondary | ICD-10-CM | POA: Diagnosis not present

## 2020-08-22 DIAGNOSIS — I701 Atherosclerosis of renal artery: Secondary | ICD-10-CM | POA: Diagnosis not present

## 2020-08-22 DIAGNOSIS — Z7901 Long term (current) use of anticoagulants: Secondary | ICD-10-CM | POA: Diagnosis not present

## 2020-08-22 DIAGNOSIS — E119 Type 2 diabetes mellitus without complications: Secondary | ICD-10-CM | POA: Diagnosis not present

## 2020-08-22 DIAGNOSIS — G4730 Sleep apnea, unspecified: Secondary | ICD-10-CM | POA: Diagnosis not present

## 2020-08-22 DIAGNOSIS — I4892 Unspecified atrial flutter: Secondary | ICD-10-CM | POA: Diagnosis not present

## 2020-08-22 DIAGNOSIS — C3411 Malignant neoplasm of upper lobe, right bronchus or lung: Secondary | ICD-10-CM | POA: Diagnosis not present

## 2020-08-22 DIAGNOSIS — I252 Old myocardial infarction: Secondary | ICD-10-CM | POA: Diagnosis not present

## 2020-08-22 DIAGNOSIS — Z87442 Personal history of urinary calculi: Secondary | ICD-10-CM | POA: Diagnosis not present

## 2020-08-22 DIAGNOSIS — E785 Hyperlipidemia, unspecified: Secondary | ICD-10-CM | POA: Diagnosis not present

## 2020-08-22 DIAGNOSIS — I739 Peripheral vascular disease, unspecified: Secondary | ICD-10-CM | POA: Diagnosis not present

## 2020-08-22 DIAGNOSIS — Z79899 Other long term (current) drug therapy: Secondary | ICD-10-CM | POA: Diagnosis not present

## 2020-08-22 DIAGNOSIS — J449 Chronic obstructive pulmonary disease, unspecified: Secondary | ICD-10-CM | POA: Diagnosis not present

## 2020-08-22 DIAGNOSIS — I10 Essential (primary) hypertension: Secondary | ICD-10-CM | POA: Diagnosis not present

## 2020-08-23 ENCOUNTER — Telehealth: Payer: Self-pay | Admitting: Physician Assistant

## 2020-08-23 NOTE — Telephone Encounter (Signed)
Scheduled appointments per 12/22 los. Called patient, no answer. Left message with appointments dates and times.

## 2020-09-10 ENCOUNTER — Encounter: Payer: Self-pay | Admitting: Family Medicine

## 2020-09-10 NOTE — Telephone Encounter (Signed)
Recommend visit for this.

## 2020-09-11 ENCOUNTER — Encounter: Payer: Self-pay | Admitting: Family Medicine

## 2020-09-11 ENCOUNTER — Telehealth (INDEPENDENT_AMBULATORY_CARE_PROVIDER_SITE_OTHER): Payer: Medicare Other | Admitting: Family Medicine

## 2020-09-11 ENCOUNTER — Telehealth: Payer: Self-pay | Admitting: Cardiovascular Disease

## 2020-09-11 DIAGNOSIS — R059 Cough, unspecified: Secondary | ICD-10-CM | POA: Diagnosis not present

## 2020-09-11 DIAGNOSIS — Z20822 Contact with and (suspected) exposure to covid-19: Secondary | ICD-10-CM | POA: Diagnosis not present

## 2020-09-11 MED ORDER — PREDNISONE 20 MG PO TABS: 20.0000 mg | ORAL_TABLET | Freq: Two times a day (BID) | ORAL | 0 refills | Status: AC

## 2020-09-11 NOTE — Progress Notes (Signed)
[name] - 75 y.o. male MRN [id number]  Date of birth: [birth year]   This visit type was conducted due to national recommendations for restrictions regarding the COVID-19 Pandemic (e.g. social distancing).  This format is felt to be most appropriate for this patient at this time.  All issues noted in this document were discussed and addressed.  No physical exam was performed (except for noted visual exam findings with Video Visits).  I discussed the limitations of evaluation and management by telemedicine and the availability of in person appointments. The patient expressed understanding and agreed to proceed.  I connected with@ on 09/11/20 at 11:30 AM EST by a video enabled telemedicine application and verified that I am speaking with the correct person using two identifiers.  Present at visit: Richard Nutting, DO [place]   Patient Location: [place] [address]   Provider location:   [place]  No chief complaint on file.   HPI  [name] is a 75 y.o. male who presents via audio/video conferencing for a telehealth visit today.  He has complaint of fatigue, low grade fever, cough and nasal congestion.  Overall symptoms are fairly mild.  Symptoms started yesterday and he had exposure to someone who tested positive for COVID.  He does have a history of COPD and lung cancer.  He has had some mild wheezing but denies shortness of breath.  He denies nausea, vomiting, diarrhea and is eating and drinking normally.  He has been vaccinated against COVID.    ROS:  A comprehensive ROS was completed and negative except as noted per HPI  Past Medical History:  Diagnosis Date  . Atypical atrial flutter ([place])   . Cancer [facility])    Testicular- surgery   . COPD (chronic obstructive pulmonary disease) ([place])   . Coronary artery disease   . Diabetes mellitus without complication ([place])   . Diastolic dysfunction, left ventricle 05/2018   Dr. Rayann Heman-  Cardiologist  . Dysrhythmia    chronic AFib  . History of kidney stones    passed  . HLD (hyperlipidemia)   . HTN (hypertension)   . Myocardial infarction ([place])    x 2 maybe 1 more  . OSA (obstructive sleep apnea)    uses CPAP  . Peripheral artery disease ([place])    pseudoaneurysm post afib ablation at [facility] 2011, s/p bilateral iliac stents  . Persistent atrial fibrillation ([place])   . Pre-diabetes   . Renal artery stenosis ([facility])    right renal artery PTA and stenting  . S/P coronary artery bypass graft x 7 11/16/96    Past Surgical History:  Procedure Laterality Date  . 2-D echocardiogram  03/25/2010   Normal left ventricular function. Mild MR, TR, trivial AR  . ATRIAL FIBRILLATION ABLATION  03/27/2010, 12/31/2010   [facility], Dr. Nadeen Landau  . BACK SURGERY  2004, 2007   Laminectomy, Discedectomy x 2  . BRONCHIAL BIOPSY  12/27/2019   Procedure: BRONCHIAL BIOPSIES;  Surgeon: Garner Nash, DO;  Location: [facility];  Service: Pulmonary;;  . BRONCHIAL BRUSHINGS  12/27/2019   Procedure: BRONCHIAL BRUSHINGS;  Surgeon: Garner Nash, DO;  Location: [place];  Service: Pulmonary;;  . BRONCHIAL NEEDLE ASPIRATION BIOPSY  12/27/2019   Procedure: BRONCHIAL NEEDLE ASPIRATION BIOPSIES;  Surgeon: Garner Nash, DO;  Location: [facility];  Service: Pulmonary;;  . cardiac stress test  11/21/2009   Exercise capacity 5 METS. No significant ischemia demonstrated  . CARDIOVERSION N/A 03/15/2015  Procedure: CARDIOVERSION;  Surgeon: Lorretta Harp, MD;  Location: [place];  Service: Cardiovascular;  Laterality: N/A;  . CARDIOVERSION N/A 07/10/2017   Procedure: CARDIOVERSION;  Surgeon: Pixie Casino, MD;  Location: [facility] ENDOSCOPY;  Service: Cardiovascular;  Laterality: N/A;  . CARDIOVERSION N/A 03/23/2018   Procedure: CARDIOVERSION;  Surgeon: Sanda Klein, MD;  Location: [facility];  Service: Cardiovascular;  Laterality: N/A;  . CARDIOVERSION N/A 05/01/2020   Procedure: CARDIOVERSION;   Surgeon: Josue Hector, MD;  Location: [facility] ENDOSCOPY;  Service: Cardiovascular;  Laterality: N/A;  . COLONOSCOPY WITH PROPOFOL N/A 04/05/2020   Procedure: COLONOSCOPY WITH PROPOFOL;  Surgeon: Carol Ada, MD;  Location: [facility];  Service: Endoscopy;  Laterality: N/A;  . CORONARY ARTERY BYPASS GRAFT  1998  . ELECTROMAGNETIC NAVIGATION BROCHOSCOPY  12/27/2019   Procedure: NAVIGATION BRONCHOSCOPY;  Surgeon: Garner Nash, DO;  Location: [facility];  Service: Pulmonary;;  . ESOPHAGOGASTRODUODENOSCOPY (EGD) WITH PROPOFOL N/A 04/05/2020   Procedure: ESOPHAGOGASTRODUODENOSCOPY (EGD) WITH PROPOFOL;  Surgeon: Carol Ada, MD;  Location: [facility];  Service: Endoscopy;  Laterality: N/A;  . FIDUCIAL MARKER PLACEMENT  12/27/2019   Procedure: FIDUCIAL MARKER PLACEMENT;  Surgeon: Garner Nash, DO;  Location: [facility] [facility];  Service: Pulmonary;;  . HOT HEMOSTASIS N/A 04/05/2020   Procedure: HOT HEMOSTASIS (ARGON PLASMA COAGULATION/BICAP);  Surgeon: Carol Ada, MD;  Location: [facility];  Service: Endoscopy;  Laterality: N/A;  . [place]   for cancer  . POLYPECTOMY  04/05/2020   Procedure: POLYPECTOMY;  Surgeon: Carol Ada, MD;  Location: [facility];  Service: Endoscopy;;  . TOOTH EXTRACTION  05/27/2013   tooth extraction with bone graft- several -for Implants  . VIDEO BRONCHOSCOPY WITH ENDOBRONCHIAL NAVIGATION N/A 12/27/2019   Procedure: VIDEO BRONCHOSCOPY;  Surgeon: Garner Nash, DO;  Location: [place];  Service: Pulmonary;  Laterality: N/A;    Family History  Problem Relation Age of Onset  . Cancer Mother   . Cancer Father   . Other Brother        NO MEDICAL PROBLEMS  . Other Son        NO MEDICAL PROBLEMS  . Leukemia Daughter 6       Recover and has no other problems    Social History   Socioeconomic History  . Marital status: Divorced    Spouse name: Not on file  . Number of children: 2  . Years of education: 44  . Highest education level: Associate  degree: academic program  Occupational History  . 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 Smoking status: Former Smoker    Packs/day: 1.50    Years: 30.00    Pack years: 45.00    Types: Cigarettes, Cigars    Quit date: 09/01/1994    Years since quitting: 26.0  . Smokeless tobacco: Never Used  . Tobacco comment: cigars quit  - 11/16/2019  Vaping Use  . Vaping Use: Never used  Substance and Sexual Activity  . Alcohol use: Not Currently  . Drug use: No  . Sexual activity: Yes  Other Topics Concern  . Not on file  Social History Narrative   Pt lives in [place] alone.  Divorced. 2 cups of coffee in the morning.   Retired from [employer])   Social Determinants of Health   Financial Resource Strain: Not on file  Food Insecurity: Not on file  Transportation Needs: Not on file  Physical Activity: Not on file  Stress:  Not on file  Social Connections: Not on file  Intimate Partner Violence: Not on file     Current Outpatient Medications:  .  predniSONE (DELTASONE) 20 MG tablet, Take 1 tablet (20 mg total) by mouth 2 (two) times daily with a meal for 5 days., Disp: 10 tablet, Rfl: 0 .  acetaminophen (TYLENOL) 500 MG tablet, Take 1,000 mg by mouth as needed for mild pain or moderate pain (for pain.). , Disp: , Rfl:  .  atorvastatin (LIPITOR) 10 MG tablet, Take 1 tablet (10 mg total) by mouth daily., Disp: 90 tablet, Rfl: 3 .  budesonide-formoterol (SYMBICORT) 160-4.5 MCG/ACT inhaler, Inhale 2 puffs into the lungs 2 (two) times daily., Disp: 10.2 g, Rfl: 3 .  gabapentin (NEURONTIN) 300 MG capsule, TAKE 2 CAPSULES(600 MG) BY MOUTH AT BEDTIME, Disp: 180 capsule, Rfl: 2 .  ketotifen (ZADITOR) 0.025 % ophthalmic solution, Place 1 drop into both eyes 2 (two) times daily as needed (allergies)., Disp: , Rfl:  .  metoprolol succinate (TOPROL-XL) 100 MG 24 hr tablet, TAKE 125MG  (1 AND 1/4 TAB) DAILY (Patient taking differently: Take 100 mg by  mouth daily. TAKE 100 MG DAILY), Disp: 90 tablet, Rfl: 3 .  Multiple Vitamin (MULTIVITAMIN WITH MINERALS) TABS tablet, Take 1 tablet by mouth daily. Centrum silver, Disp: , Rfl:  .  niacin (NIASPAN) 1000 MG CR tablet, TAKE 1 TABLET(1000 MG) BY MOUTH AT BEDTIME, Disp: 90 tablet, Rfl: 3 .  nicotine polacrilex (COMMIT) 2 MG lozenge, Take 2 mg by mouth daily as needed for smoking cessation. , Disp: , Rfl:  .  nitroGLYCERIN (NITROSTAT) 0.4 MG SL tablet, Place 1 tablet (0.4 mg total) under the tongue every 5 (five) minutes as needed., Disp: 25 tablet, Rfl: 3 .  NON FORMULARY, CPAP at night, Disp: , Rfl:  .  ondansetron (ZOFRAN) 8 MG tablet, Take 1 tablet (8 mg total) by mouth every 8 (eight) hours as needed for nausea or vomiting., Disp: 30 tablet, Rfl: 1 .  oxymetazoline (AFRIN) 0.05 % nasal spray, Place 1 spray into both nostrils daily as needed for congestion. , Disp: , Rfl:  .  pantoprazole (PROTONIX) 40 MG tablet, Take 1 tablet (40 mg total) by mouth daily., Disp: 30 tablet, Rfl: 2 .  prochlorperazine (COMPAZINE) 10 MG tablet, TAKE 1 TABLET(10 MG) BY MOUTH EVERY 6 HOURS AS NEEDED FOR NAUSEA OR VOMITING, Disp: 30 tablet, Rfl: 0 .  sertraline (ZOLOFT) 50 MG tablet, Take 1 tablet (50 mg total) by mouth daily. Take 1/2 tab x1 week then increase to full tab, Disp: 30 tablet, Rfl: 3 .  traMADol (ULTRAM) 50 MG tablet, TAKE 1 TABLET(50 MG) BY MOUTH EVERY 12 HOURS AS NEEDED, Disp: 30 tablet, Rfl: 3 .  traZODone (DESYREL) 50 MG tablet, TAKE 1/2 TO 1 TABLET BY MOUTH AT BEDTIME AS NEEDED FOR SLEEP, Disp: 90 tablet, Rfl: 3 .  VENTOLIN HFA 108 (90 Base) MCG/ACT inhaler, INHALE 2 PUFFS INTO THE LUNGS EVERY 6 HOURS AS NEEDED FOR WHEEZING OR SHORTNESS OF BREATH, Disp: 18 g, Rfl: 1 .  XARELTO 20 MG TABS tablet, TAKE 1 TABLET BY MOUTH EVERY DAY WITH SUPPER, Disp: 90 tablet, Rfl: 1  Current Facility-Administered Medications:  .  sodium chloride flush (NS) 0.9 % injection 3 mL, 3 mL, Intravenous, Q12H, Gwenlyn Found Pearletha [place],  MD  EXAMTonette Bihari per patient if applicable: There were no vitals taken for this visit.  GENERAL: alert, oriented, appears well and in no acute distress  HEENT: atraumatic, conjunttiva clear,  no obvious abnormalities on inspection of external nose and ears  NECK: normal movements of the head and neck  LUNGS: on inspection no signs of respiratory distress, breathing rate appears normal, no obvious gross SOB, gasping or wheezing  CV: no obvious cyanosis  MS: moves all visible extremities without noticeable abnormality  PSYCH/NEURO: pleasant and cooperative, no obvious depression or anxiety, speech and thought processing grossly intact  ASSESSMENT AND PLAN:  Discussed the following assessment and plan:  Suspected COVID-19 virus infection Positive exposure as well as symptoms. Thankfully symptoms are pretty mild at this time.  He will stop by the clinic to be tested.  Some mild increase in wheezing.  Will add course of prednisone.  Continue current inhalers.  He has multiple co-morbidities and I think he would benefit from monoclonal antibody if he tests positive.      I discussed the assessment and treatment plan with the patient. The patient was provided an opportunity to ask questions and all were answered. The patient agreed with the plan and demonstrated an understanding of the instructions.   The patient was advised to call back or seek an in-person evaluation if the symptoms worsen or if the condition fails to improve as anticipated.    Richard Nutting, DO

## 2020-09-11 NOTE — Assessment & Plan Note (Signed)
Positive exposure as well as symptoms. Thankfully symptoms are pretty mild at this time.  He will stop by the clinic to be tested.  Some mild increase in wheezing.  Will add course of prednisone.  Continue current inhalers.  He has multiple co-morbidities and I think he would benefit from monoclonal antibody if he tests positive.

## 2020-09-11 NOTE — Telephone Encounter (Signed)
Patient called and said he has COVID. He needs to re-schedule hie heart cath. Please call to re-schedule

## 2020-09-12 NOTE — Telephone Encounter (Signed)
Called pt this morning. Able to reschedule pt's PV angiogram, orders still in his chart to get labs. Angiogram rescheduled for 10/08/20. Pt reminded of pre-procedure instructions.  Pt verbalizes understanding. Spoke with cath lab.

## 2020-09-14 ENCOUNTER — Other Ambulatory Visit: Payer: Self-pay | Admitting: Family Medicine

## 2020-09-14 ENCOUNTER — Other Ambulatory Visit (HOSPITAL_COMMUNITY): Payer: Medicare Other

## 2020-09-14 DIAGNOSIS — U071 COVID-19: Secondary | ICD-10-CM

## 2020-09-14 LAB — NOVEL CORONAVIRUS, NAA: SARS-CoV-2, NAA: DETECTED — AB

## 2020-09-14 LAB — SPECIMEN STATUS REPORT

## 2020-09-25 ENCOUNTER — Encounter: Payer: Self-pay | Admitting: Family Medicine

## 2020-09-26 ENCOUNTER — Ambulatory Visit (HOSPITAL_COMMUNITY)
Admission: RE | Admit: 2020-09-26 | Payer: Medicare Other | Source: Ambulatory Visit | Attending: Cardiovascular Disease | Admitting: Cardiovascular Disease

## 2020-09-26 ENCOUNTER — Encounter: Payer: Self-pay | Admitting: Family Medicine

## 2020-09-26 ENCOUNTER — Telehealth (INDEPENDENT_AMBULATORY_CARE_PROVIDER_SITE_OTHER): Payer: Medicare Other | Admitting: Family Medicine

## 2020-09-26 ENCOUNTER — Telehealth: Payer: Self-pay

## 2020-09-26 DIAGNOSIS — Z8616 Personal history of COVID-19: Secondary | ICD-10-CM

## 2020-09-26 DIAGNOSIS — F331 Major depressive disorder, recurrent, moderate: Secondary | ICD-10-CM | POA: Diagnosis not present

## 2020-09-26 NOTE — Telephone Encounter (Signed)
Upon chart review- positive results can be seen under labs for 09/11/20. Disregard phone call request.

## 2020-09-26 NOTE — Telephone Encounter (Signed)
Yes, ok to change to virtual.

## 2020-09-27 DIAGNOSIS — Z8616 Personal history of COVID-19: Secondary | ICD-10-CM | POA: Insufficient documentation

## 2020-09-27 DIAGNOSIS — F329 Major depressive disorder, single episode, unspecified: Secondary | ICD-10-CM | POA: Insufficient documentation

## 2020-09-27 NOTE — Assessment & Plan Note (Addendum)
He is doing well from recent COVID infection.  Mild cough without dyspnea, pretty much back to baseline.

## 2020-09-27 NOTE — Progress Notes (Signed)
[name] - 75 y.o. male MRN [id number]  Date of birth: [birth year]   This visit type was conducted due to national recommendations for restrictions regarding the COVID-19 Pandemic (e.g. social distancing).  This format is felt to be most appropriate for this patient at this time.  All issues noted in this document were discussed and addressed.  No physical exam was performed (except for noted visual exam findings with Video Visits).  I discussed the limitations of evaluation and management by telemedicine and the availability of in person appointments. The patient expressed understanding and agreed to proceed.  I connected with@ on 09/27/20 at  2:00 PM EST by a video enabled telemedicine application and verified that I am speaking with the correct person using two identifiers.  Present at visit: Luetta Nutting, DO [place]   Patient Location: [place] [address]   Provider location:   [facility]  Chief Complaint  Patient presents with  . mood    HPI  [name] is a 75 y.o. male who presents via audio/video conferencing for a telehealth visit today.  He is following up today for depression.  He recently had COVID as well.  He is doing well at this point.  Still with mild cough but denies shortness of breath.    He was started on sertraline at visit with me in December.  He reports that this seems to be working well for him.  Energy levels and overall enjoyment of life have improved.  He would like to continue at current strength.  He has not had any significant side effects from medication.      ROS:  A comprehensive ROS was completed and negative except as noted per HPI  Past Medical History:  Diagnosis Date  . Atypical atrial flutter ([place])   . Cancer [facility])    Testicular- surgery   . COPD (chronic obstructive pulmonary disease) ([place])   . Coronary artery disease   . Diabetes mellitus without complication ([place])   . Diastolic  dysfunction, left ventricle 05/2018   Dr. Rayann Heman- Cardiologist  . Dysrhythmia    chronic AFib  . History of kidney stones    passed  . HLD (hyperlipidemia)   . HTN (hypertension)   . Myocardial infarction ([place])    x 2 maybe 1 more  . OSA (obstructive sleep apnea)    uses CPAP  . Peripheral artery disease ([place])    pseudoaneurysm post afib ablation at [facility] 2011, s/p bilateral iliac stents  . Persistent atrial fibrillation ([place])   . Pre-diabetes   . Renal artery stenosis ([facility])    right renal artery PTA and stenting  . S/P coronary artery bypass graft x 7 11/16/96    Past Surgical History:  Procedure Laterality Date  . 2-D echocardiogram  03/25/2010   Normal left ventricular function. Mild MR, TR, trivial AR  . ATRIAL FIBRILLATION ABLATION  03/27/2010, 12/31/2010   [facility], Dr. Nadeen Landau  . BACK SURGERY  2004, 2007   Laminectomy, Discedectomy x 2  . BRONCHIAL BIOPSY  12/27/2019   Procedure: BRONCHIAL BIOPSIES;  Surgeon: Garner Nash, DO;  Location: [facility];  Service: Pulmonary;;  . BRONCHIAL BRUSHINGS  12/27/2019   Procedure: BRONCHIAL BRUSHINGS;  Surgeon: Garner Nash, DO;  Location: [facility];  Service: Pulmonary;;  . BRONCHIAL NEEDLE ASPIRATION BIOPSY  12/27/2019   Procedure: BRONCHIAL NEEDLE ASPIRATION BIOPSIES;  Surgeon: Garner Nash, DO;  Location: [facility];  Service: Pulmonary;;  .  cardiac stress test  11/21/2009   Exercise capacity 5 METS. No significant ischemia demonstrated  . CARDIOVERSION N/A 03/15/2015   Procedure: CARDIOVERSION;  Surgeon: Lorretta Harp, MD;  Location: [facility];  Service: Cardiovascular;  Laterality: N/A;  . CARDIOVERSION N/A 07/10/2017   Procedure: CARDIOVERSION;  Surgeon: Pixie Casino, MD;  Location: [facility] ENDOSCOPY;  Service: Cardiovascular;  Laterality: N/A;  . CARDIOVERSION N/A 03/23/2018   Procedure: CARDIOVERSION;  Surgeon: Sanda Klein, MD;  Location: [facility]COPY;  Service: Cardiovascular;  Laterality: N/A;  .  CARDIOVERSION N/A 05/01/2020   Procedure: CARDIOVERSION;  Surgeon: Josue Hector, MD;  Location: [facility] ENDOSCOPY;  Service: Cardiovascular;  Laterality: N/A;  . COLONOSCOPY WITH PROPOFOL N/A 04/05/2020   Procedure: COLONOSCOPY WITH PROPOFOL;  Surgeon: Carol Ada, MD;  Location: [facility] ENDOSCOPY;  Service: Endoscopy;  Laterality: N/A;  . CORONARY ARTERY BYPASS GRAFT  1998  . ELECTROMAGNETIC NAVIGATION BROCHOSCOPY  12/27/2019   Procedure: NAVIGATION BRONCHOSCOPY;  Surgeon: Garner Nash, DO;  Location: [place] [facility];  Service: Pulmonary;;  . ESOPHAGOGASTRODUODENOSCOPY (EGD) WITH PROPOFOL N/A 04/05/2020   Procedure: ESOPHAGOGASTRODUODENOSCOPY (EGD) WITH PROPOFOL;  Surgeon: Carol Ada, MD;  Location: [facility];  Service: Endoscopy;  Laterality: N/A;  . FIDUCIAL MARKER PLACEMENT  12/27/2019   Procedure: FIDUCIAL MARKER PLACEMENT;  Surgeon: Garner Nash, DO;  Location: [facility];  Service: Pulmonary;;  . HOT HEMOSTASIS N/A 04/05/2020   Procedure: HOT HEMOSTASIS (ARGON PLASMA COAGULATION/BICAP);  Surgeon: Carol Ada, MD;  Location: [facility];  Service: Endoscopy;  Laterality: N/A;  . [place]   for cancer  . POLYPECTOMY  04/05/2020   Procedure: POLYPECTOMY;  Surgeon: Carol Ada, MD;  Location: [facility];  Service: Endoscopy;;  . TOOTH EXTRACTION  05/27/2013   tooth extraction with bone graft- several -for Implants  . VIDEO BRONCHOSCOPY WITH ENDOBRONCHIAL NAVIGATION N/A 12/27/2019   Procedure: VIDEO BRONCHOSCOPY;  Surgeon: Garner Nash, DO;  Location: [facility];  Service: Pulmonary;  Laterality: N/A;    Family History  Problem Relation Age of Onset  . Cancer Mother   . Cancer Father   . Other Brother        NO MEDICAL PROBLEMS  . Other Son        NO MEDICAL PROBLEMS  . Leukemia Daughter 6       Recover and has no other problems    Social History   Socioeconomic History  . Marital status: Divorced    Spouse name: Not on file  . Number of children: 2  .  Years of education: 3  . Highest education level: Associate degree: academic program  Occupational History  . 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 Smoking status: Former Smoker    Packs/day: 1.50    Years: 30.00    Pack years: 45.00    Types: Cigarettes, Cigars    Quit date: 09/01/1994    Years since quitting: 26.0  . Smokeless tobacco: Never Used  . Tobacco comment: cigars quit  - 11/16/2019  Vaping Use  . Vaping Use: Never used  Substance and Sexual Activity  . Alcohol use: Not Currently  . Drug use: No  . Sexual activity: Yes  Other Topics Concern  . Not on file  Social History Narrative   Pt lives in [place] alone.  Divorced. 2 cups of coffee in the morning.   Retired from [employer])   Social Determinants of Health   Financial Resource Strain: Not  on file  Food Insecurity: Not on file  Transportation Needs: Not on file  Physical Activity: Not on file  Stress: Not on file  Social Connections: Not on file  Intimate Partner Violence: Not on file     Current Outpatient Medications:  .  acetaminophen (TYLENOL) 500 MG tablet, Take 1,000 mg by mouth every 6 (six) hours as needed for mild pain or moderate pain., Disp: , Rfl:  .  atorvastatin (LIPITOR) 10 MG tablet, Take 1 tablet (10 mg total) by mouth daily., Disp: 90 tablet, Rfl: 3 .  budesonide-formoterol (SYMBICORT) 160-4.5 MCG/ACT inhaler, Inhale 2 puffs into the lungs 2 (two) times daily. (Patient taking differently: Inhale 2 puffs into the lungs 2 (two) times daily as needed (shortness of breath).), Disp: 10.2 g, Rfl: 3 .  gabapentin (NEURONTIN) 300 MG capsule, TAKE 2 CAPSULES(600 MG) BY MOUTH AT BEDTIME (Patient taking differently: Take 600 mg by mouth every evening.), Disp: 180 capsule, Rfl: 2 .  ketotifen (ZADITOR) 0.025 % ophthalmic solution, Place 1 drop into both eyes 2 (two) times daily as needed (allergies)., Disp: , Rfl:  .  metoprolol succinate  (TOPROL-XL) 100 MG 24 hr tablet, TAKE 125MG  (1 AND 1/4 TAB) DAILY (Patient taking differently: Take 100 mg by mouth daily.), Disp: 90 tablet, Rfl: 3 .  Multiple Vitamin (MULTIVITAMIN WITH MINERALS) TABS tablet, Take 1 tablet by mouth daily. Centrum silver, Disp: , Rfl:  .  niacin (NIASPAN) 1000 MG CR tablet, TAKE 1 TABLET(1000 MG) BY MOUTH AT BEDTIME (Patient taking differently: Take 1,000 mg by mouth at bedtime.), Disp: 90 tablet, Rfl: 3 .  nicotine polacrilex (COMMIT) 2 MG lozenge, Take 2 mg by mouth daily as needed for smoking cessation. , Disp: , Rfl:  .  nitroGLYCERIN (NITROSTAT) 0.4 MG SL tablet, Place 1 tablet (0.4 mg total) under the tongue every 5 (five) minutes as needed. (Patient taking differently: Place 0.4 mg under the tongue every 5 (five) minutes as needed for chest pain.), Disp: 25 tablet, Rfl: 3 .  NON FORMULARY, CPAP at night, Disp: , Rfl:  .  ondansetron (ZOFRAN) 8 MG tablet, Take 1 tablet (8 mg total) by mouth every 8 (eight) hours as needed for nausea or vomiting. (Patient not taking: No sig reported), Disp: 30 tablet, Rfl: 1 .  oxymetazoline (AFRIN) 0.05 % nasal spray, Place 1 spray into both nostrils daily as needed for congestion. , Disp: , Rfl:  .  prochlorperazine (COMPAZINE) 10 MG tablet, TAKE 1 TABLET(10 MG) BY MOUTH EVERY 6 HOURS AS NEEDED FOR NAUSEA OR VOMITING (Patient not taking: No sig reported), Disp: 30 tablet, Rfl: 0 .  sertraline (ZOLOFT) 50 MG tablet, Take 1 tablet (50 mg total) by mouth daily. Take 1/2 tab x1 week then increase to full tab (Patient taking differently: Take 50 mg by mouth daily.), Disp: 30 tablet, Rfl: 3 .  traMADol (ULTRAM) 50 MG tablet, TAKE 1 TABLET(50 MG) BY MOUTH EVERY 12 HOURS AS NEEDED (Patient taking differently: Take 50 mg by mouth every 12 (twelve) hours as needed for moderate pain. TAKE 1 TABLET(50 MG) BY MOUTH EVERY 12 HOURS AS NEEDED), Disp: 30 tablet, Rfl: 3 .  traZODone (DESYREL) 50 MG tablet, TAKE 1/2 TO 1 TABLET BY MOUTH AT BEDTIME  AS NEEDED FOR SLEEP (Patient taking differently: Take 50 mg by mouth at bedtime.), Disp: 90 tablet, Rfl: 3 .  VENTOLIN HFA 108 (90 Base) MCG/ACT inhaler, INHALE 2 PUFFS INTO THE LUNGS EVERY 6 HOURS AS NEEDED FOR WHEEZING OR SHORTNESS OF  BREATH (Patient taking differently: Inhale 2 puffs into the lungs every 6 (six) hours as needed.), Disp: 18 g, Rfl: 1 .  XARELTO 20 MG TABS tablet, TAKE 1 TABLET BY MOUTH EVERY DAY WITH SUPPER (Patient taking differently: Take 20 mg by mouth daily with supper.), Disp: 90 tablet, Rfl: 1 .  dronedarone (MULTAQ) 400 MG tablet, Take 400 mg by mouth 2 (two) times daily., Disp: , Rfl:  .  empagliflozin (JARDIANCE) 10 MG TABS tablet, Take 10 mg by mouth daily., Disp: , Rfl:  .  pantoprazole (PROTONIX) 40 MG tablet, Take 1 tablet (40 mg total) by mouth daily., Disp: 30 tablet, Rfl: 2  Current Facility-Administered Medications:  .  sodium chloride flush (NS) 0.9 % injection 3 mL, 3 mL, Intravenous, Q12H, Gwenlyn Found Pearletha Forge, MD  EXAMTonette Bihari per patient if applicable: Wt 205 lb (93 kg)   BMI 27.80 kg/m   GENERAL: alert, oriented, appears well and in no acute distress  HEENT: atraumatic, conjunttiva clear, no obvious abnormalities on inspection of external nose and ears  NECK: normal movements of the head and neck  LUNGS: on inspection no signs of respiratory distress, breathing rate appears normal, no obvious gross SOB, gasping or wheezing  CV: no obvious cyanosis  MS: moves all visible extremities without noticeable abnormality  PSYCH/NEURO: pleasant and cooperative, no obvious depression or anxiety, speech and thought processing grossly intact  ASSESSMENT AND PLAN:  Discussed the following assessment and plan:  MDD (major depressive disorder) He is doing well with addition of sertraline.  Will continue at current strength and plan to follow up in about 3 months.   History of COVID-19 He is doing well from recent COVID infection.  Mild cough without  dyspnea, pretty much back to baseline.      I discussed the assessment and treatment plan with the patient. The patient was provided an opportunity to ask questions and all were answered. The patient agreed with the plan and demonstrated an understanding of the instructions.   The patient was advised to call back or seek an in-person evaluation if the symptoms worsen or if the condition fails to improve as anticipated.    Luetta Nutting, DO

## 2020-09-27 NOTE — Assessment & Plan Note (Signed)
He is doing well with addition of sertraline.  Will continue at current strength and plan to follow up in about 3 months.

## 2020-10-04 ENCOUNTER — Other Ambulatory Visit: Payer: Self-pay

## 2020-10-04 ENCOUNTER — Telehealth: Payer: Self-pay

## 2020-10-04 ENCOUNTER — Telehealth: Payer: Self-pay | Admitting: *Deleted

## 2020-10-04 DIAGNOSIS — I739 Peripheral vascular disease, unspecified: Secondary | ICD-10-CM | POA: Diagnosis not present

## 2020-10-04 NOTE — Telephone Encounter (Signed)
Pt contacted pre-catheterization scheduled at [facility] for: Monday February 7,2022 7:30 AM Verified arrival time and place: [facility]) at: 5;30 AM   No solid food after midnight prior to cath, clear liquids until 5 AM day of procedure.  Hold: Xarelto-none 10/06/20 until post procedure [place]-AM of procedure  Except hold medications AM meds can be  taken pre-cath with sips of water including: ASA 81 mg   Confirmed patient has responsible adult to drive home post procedure and be with patient first 24 hours after arriving home: yes  You are allowed ONE visitor in the waiting room during the time you are at the hospital for your procedure. Both you and your visitor must wear a mask once you enter the hospital.   Reviewed procedure/mask/visitor instructions with patient.  09/11/20 COVID-19 positive-pt reports no symptoms concerning for COVID-19.

## 2020-10-04 NOTE — Telephone Encounter (Signed)
Spoke with pt concerning lab work.  Asked pt if he was able to get labs done today or tomorrow for procedure scheduled for Monday, Feb 7. Pt states that he can come to the office and have labs drawn tomorrow.  Updated pt on our labs hours and instructions for when he arrives. Pt verbalizes understanding.

## 2020-10-05 ENCOUNTER — Ambulatory Visit: Payer: Medicare Other | Admitting: Cardiovascular Disease

## 2020-10-05 LAB — CBC
Hematocrit: 32.3 % — ABNORMAL LOW (ref 37.5–51.0)
Hemoglobin: 9 g/dL — ABNORMAL LOW (ref 13.0–17.7)
MCH: 23.2 pg — ABNORMAL LOW (ref 26.6–33.0)
MCHC: 27.9 g/dL — ABNORMAL LOW (ref 31.5–35.7)
MCV: 83 fL (ref 79–97)
Platelets: 199 10*3/uL (ref 150–450)
RBC: 3.88 x10E6/uL — ABNORMAL LOW (ref 4.14–5.80)
RDW: 18.9 % — ABNORMAL HIGH (ref 11.6–15.4)
WBC: 4.9 10*3/uL (ref 3.4–10.8)

## 2020-10-05 LAB — BASIC METABOLIC PANEL
BUN/Creatinine Ratio: 8 — ABNORMAL LOW (ref 10–24)
BUN: 8 mg/dL (ref 8–27)
CO2: 25 mmol/L (ref 20–29)
Calcium: 8.5 mg/dL — ABNORMAL LOW (ref 8.6–10.2)
Chloride: 108 mmol/L — ABNORMAL HIGH (ref 96–106)
Creatinine, Ser: 0.98 mg/dL (ref 0.76–1.27)
GFR calc Af Amer: 89 mL/min/{1.73_m2} (ref >59)
GFR calc non Af Amer: 77 mL/min/{1.73_m2} (ref >59)
Glucose: 100 mg/dL — ABNORMAL HIGH (ref 65–99)
Potassium: 4.5 mmol/L (ref 3.5–5.2)
Sodium: 145 mmol/L — ABNORMAL HIGH (ref 134–144)

## 2020-10-08 ENCOUNTER — Encounter (HOSPITAL_COMMUNITY): Payer: Self-pay | Admitting: Cardiovascular Disease

## 2020-10-08 ENCOUNTER — Ambulatory Visit (HOSPITAL_COMMUNITY)
Admission: RE | Disposition: A | Discharge status: Home / Self Care | Payer: Self-pay | Source: Home / Self Care | Attending: Cardiovascular Disease

## 2020-10-08 ENCOUNTER — Ambulatory Visit (HOSPITAL_COMMUNITY)
Admission: RE | Admit: 2020-10-08 | Discharge: 2020-10-08 | Disposition: A | Discharge status: Home / Self Care | Payer: Medicare Other | Attending: Cardiovascular Disease | Admitting: Cardiovascular Disease

## 2020-10-08 DIAGNOSIS — E1169 Type 2 diabetes mellitus with other specified complication: Secondary | ICD-10-CM | POA: Diagnosis not present

## 2020-10-08 DIAGNOSIS — Z87891 Personal history of nicotine dependence: Secondary | ICD-10-CM | POA: Insufficient documentation

## 2020-10-08 DIAGNOSIS — I70213 Atherosclerosis of native arteries of extremities with intermittent claudication, bilateral legs: Secondary | ICD-10-CM | POA: Insufficient documentation

## 2020-10-08 DIAGNOSIS — Z951 Presence of aortocoronary bypass graft: Secondary | ICD-10-CM | POA: Diagnosis not present

## 2020-10-08 DIAGNOSIS — I4892 Unspecified atrial flutter: Secondary | ICD-10-CM | POA: Diagnosis not present

## 2020-10-08 DIAGNOSIS — E1151 Type 2 diabetes mellitus with diabetic peripheral angiopathy without gangrene: Secondary | ICD-10-CM | POA: Insufficient documentation

## 2020-10-08 DIAGNOSIS — Z79899 Other long term (current) drug therapy: Secondary | ICD-10-CM | POA: Insufficient documentation

## 2020-10-08 DIAGNOSIS — Z7951 Long term (current) use of inhaled steroids: Secondary | ICD-10-CM | POA: Diagnosis not present

## 2020-10-08 DIAGNOSIS — C3490 Malignant neoplasm of unspecified part of unspecified bronchus or lung: Secondary | ICD-10-CM | POA: Insufficient documentation

## 2020-10-08 DIAGNOSIS — E785 Hyperlipidemia, unspecified: Secondary | ICD-10-CM | POA: Diagnosis not present

## 2020-10-08 DIAGNOSIS — I480 Paroxysmal atrial fibrillation: Secondary | ICD-10-CM | POA: Diagnosis not present

## 2020-10-08 DIAGNOSIS — I10 Essential (primary) hypertension: Secondary | ICD-10-CM | POA: Insufficient documentation

## 2020-10-08 DIAGNOSIS — I2510 Atherosclerotic heart disease of native coronary artery without angina pectoris: Secondary | ICD-10-CM | POA: Diagnosis not present

## 2020-10-08 DIAGNOSIS — Z7901 Long term (current) use of anticoagulants: Secondary | ICD-10-CM | POA: Diagnosis not present

## 2020-10-08 DIAGNOSIS — I255 Ischemic cardiomyopathy: Secondary | ICD-10-CM | POA: Diagnosis not present

## 2020-10-08 DIAGNOSIS — I771 Stricture of artery: Secondary | ICD-10-CM

## 2020-10-08 DIAGNOSIS — I739 Peripheral vascular disease, unspecified: Secondary | ICD-10-CM | POA: Diagnosis not present

## 2020-10-08 DIAGNOSIS — Z95828 Presence of other vascular implants and grafts: Secondary | ICD-10-CM | POA: Insufficient documentation

## 2020-10-08 HISTORY — PX: ABDOMINAL AORTOGRAM W/LOWER EXTREMITY: CATH118223

## 2020-10-08 LAB — GLUCOSE, CAPILLARY: Glucose-Capillary: 98 mg/dL (ref 70–99)

## 2020-10-08 SURGERY — ABDOMINAL AORTOGRAM W/LOWER EXTREMITY
Anesthesia: LOCAL | Laterality: Bilateral

## 2020-10-08 MED ORDER — SODIUM CHLORIDE 0.9 % WEIGHT BASED INFUSION
3.0000 mL/kg/h | INTRAVENOUS | Status: AC
  Administered 2020-10-08: 3 mL/kg/h via INTRAVENOUS

## 2020-10-08 MED ORDER — FENTANYL CITRATE (PF) 100 MCG/2ML IJ SOLN
INTRAMUSCULAR | Status: DC | PRN
  Administered 2020-10-08: 25 ug via INTRAVENOUS

## 2020-10-08 MED ORDER — SODIUM CHLORIDE 0.9% FLUSH: 3.0000 mL | Freq: Two times a day (BID) | INTRAVENOUS | Status: DC

## 2020-10-08 MED ORDER — MIDAZOLAM HCL 2 MG/2ML IJ SOLN
INTRAMUSCULAR | Status: AC
  Filled 2020-10-08: qty 2

## 2020-10-08 MED ORDER — ATORVASTATIN CALCIUM 80 MG PO TABS: 80.0000 mg | ORAL_TABLET | Freq: Every day | ORAL | Status: DC

## 2020-10-08 MED ORDER — SODIUM CHLORIDE 0.9 % WEIGHT BASED INFUSION: 1.0000 mL/kg/h | INTRAVENOUS | Status: DC

## 2020-10-08 MED ORDER — FENTANYL CITRATE (PF) 100 MCG/2ML IJ SOLN
INTRAMUSCULAR | Status: AC
  Filled 2020-10-08: qty 2

## 2020-10-08 MED ORDER — SODIUM CHLORIDE 0.9% FLUSH: 3.0000 mL | INTRAVENOUS | Status: DC | PRN

## 2020-10-08 MED ORDER — MIDAZOLAM HCL 2 MG/2ML IJ SOLN
INTRAMUSCULAR | Status: DC | PRN
  Administered 2020-10-08: 1 mg via INTRAVENOUS

## 2020-10-08 MED ORDER — HYDRALAZINE HCL 20 MG/ML IJ SOLN
5.0000 mg | INTRAMUSCULAR | Status: DC | PRN
Start: 2020-10-08 — End: 2020-10-08

## 2020-10-08 MED ORDER — ASPIRIN 81 MG PO CHEW: 81.0000 mg | CHEWABLE_TABLET | ORAL | Status: DC

## 2020-10-08 MED ORDER — SODIUM CHLORIDE 0.9 % IV SOLN: 250.0000 mL | INTRAVENOUS | Status: DC | PRN

## 2020-10-08 MED ORDER — IODIXANOL 320 MG/ML IV SOLN
INTRAVENOUS | Status: DC | PRN
  Administered 2020-10-08: 112 mL via INTRA_ARTERIAL

## 2020-10-08 MED ORDER — HEPARIN (PORCINE) IN NACL 1000-0.9 UT/500ML-% IV SOLN
INTRAVENOUS | Status: AC
  Filled 2020-10-08: qty 500

## 2020-10-08 MED ORDER — SODIUM CHLORIDE 0.9 % IV SOLN: INTRAVENOUS | Status: DC

## 2020-10-08 MED ORDER — LABETALOL HCL 5 MG/ML IV SOLN: 10.0000 mg | INTRAVENOUS | Status: DC | PRN

## 2020-10-08 MED ORDER — ASPIRIN EC 81 MG PO TBEC: 81.0000 mg | DELAYED_RELEASE_TABLET | Freq: Every day | ORAL | Status: DC

## 2020-10-08 MED ORDER — SODIUM CHLORIDE 0.9 % IV SOLN
250.0000 mL | INTRAVENOUS | Status: DC | PRN
Start: 2020-10-08 — End: 2020-10-08

## 2020-10-08 MED ORDER — LIDOCAINE HCL (PF) 1 % IJ SOLN
INTRAMUSCULAR | Status: DC | PRN
  Administered 2020-10-08: 15 mL via INTRADERMAL

## 2020-10-08 MED ORDER — ONDANSETRON HCL 4 MG/2ML IJ SOLN: 4.0000 mg | Freq: Four times a day (QID) | INTRAMUSCULAR | Status: DC | PRN

## 2020-10-08 MED ORDER — ACETAMINOPHEN 325 MG PO TABS: 650.0000 mg | ORAL_TABLET | ORAL | Status: DC | PRN

## 2020-10-08 SURGICAL SUPPLY — 11 items
CATH ANGIO 5F PIGTAIL 65CM (CATHETERS) ×1 IMPLANT
CLOSURE MYNX CONTROL 5F (Vascular Products) ×1 IMPLANT
KIT PV (KITS) ×2 IMPLANT
SHEATH PINNACLE 5F 10CM (SHEATH) ×1 IMPLANT
SHEATH PROBE COVER 6X72 (BAG) ×1 IMPLANT
STOPCOCK MORSE 400PSI 3WAY (MISCELLANEOUS) ×1 IMPLANT
SYR MEDRAD MARK 7 150ML (SYRINGE) ×2 IMPLANT
TRANSDUCER W/STOPCOCK (MISCELLANEOUS) ×2 IMPLANT
TRAY PV CATH (CUSTOM PROCEDURE TRAY) ×2 IMPLANT
TUBING CIL FLEX 10 FLL-RA (TUBING) ×1 IMPLANT
WIRE HITORQ VERSACORE ST 145CM (WIRE) ×1 IMPLANT

## 2020-10-08 NOTE — Interval H&P Note (Signed)
History and Physical Interval Note:  10/08/2020 7:31 AM  [name]  has presented today for surgery, with the diagnosis of PAD.  The various methods of treatment have been discussed with the patient and family. After consideration of risks, benefits and other options for treatment, the patient has consented to  Procedure(s): ABDOMINAL AORTOGRAM W/LOWER EXTREMITY (Bilateral) as a surgical intervention.  The patient's history has been reviewed, patient examined, no change in status, stable for surgery.  I have reviewed the patient's chart and labs.  Questions were answered to the patient's satisfaction.     Quay Burow

## 2020-10-08 NOTE — H&P (Signed)
10/08/20 [name]   [birth year]  [id number]  Primary Physician Luetta Nutting, DO Primary Cardiologist: Lorretta Harp MD FACP, [place], [place], [place]  HPI:  [name] is a 75 y.o.  moderately overweight, divorced, Caucasian male father of 2, grandfather of 1 grandchild who I saw 07/13/2019.He has a history of CAD status post coronary artery bypass grafting x7 November 16, 1996. His other problems include PVOD status post bilateral iliac artery PTA and stenting by myself remotely with restenting in 2007. He has had right renal artery PTA and stenting as well. His other problems include hypertension, hyperlipidemia, non-insulin-requiring diabetes. He does have paroxysmal atrial fibrillation and has undergone multiple DC cardioversions as well as atrial fibrillation ablations by Dr. Samara Deist at [facility], most recently in May of last year, though he is now back in atrial fibrillation/flutter on a daily basis, which he is aware of but not symptomatic from. He is active and works out on a treadmill every day. He denies chest pain or shortness of breath. His last stress test performed one year ago which revealed an inferolateral scar without ischemia.  Since I saw him at 9 months ago has remained clinically stable. He denies chest pain, shortness of breath or claudication. He works out on a treadmill or plays golf daily. He is a Myoview stress test performed in October 2014 that showed inferolateral scar without ischemia unchanged from prior studies. Recent arterial Doppler studies of his lower extremities revealed ABIs in the 0.9 range bilaterally with patent iliac stents.he's noticed tachycardia over the last several months which on EKG yesterday is suggested A. Fib with RVR. He has been on Coumadin anticoagulation and has been therapeutic. I performed outpatient DC cardioversion on him 03/15/15 successfully back to sinus rhythm. He saw Dr. Rayann Heman subsequent to that and was placed on  Multaq antiarrhythmic therapy. His Coumadin was switched to Xarelto .   Since I saw him a year ago he was diagnosis with small cell lung cancer unfortunately.  Chest CT performed 11/16/2019 revealing nodule and biopsy confirmed small cell cancer.  Is been treated with chemotherapy and radiation therapy.  He also underwent cardioversion by Dr. Johnsie Cancel 05/01/2020 successfully after 3 shocks to sinus rhythm and was seen in the A. fib clinic shortly thereafter maintaining sinus rhythm.  Unfortunately, he is in A. fib today with controlled ventricular response although he is unaware that.  He also complains of bilateral lower extremity claudication left greater than right which is lifestyle limiting with Dopplers to suggest high-grade disease in both right and left common and external iliac arteries.  He wishes to proceed with angiography and intervention.        Current Meds  Medication Sig  . acetaminophen (TYLENOL) 500 MG tablet Take 1,000 mg by mouth as needed for mild pain or moderate pain (for pain.).   [name] atorvastatin (LIPITOR) 10 MG tablet Take 1 tablet (10 mg total) by mouth daily.  . budesonide-formoterol (SYMBICORT) 160-4.5 MCG/ACT inhaler Inhale 2 puffs into the lungs 2 (two) times daily.  [name] gabapentin (NEURONTIN) 300 MG capsule TAKE 2 CAPSULES(600 MG) BY MOUTH AT BEDTIME  . ketotifen (ZADITOR) 0.025 % ophthalmic solution Place 1 drop into both eyes 2 (two) times daily as needed (allergies).  [name] lisinopril-hydrochlorothiazide (ZESTORETIC) 10-12.5 MG tablet Take 1 tablet by mouth daily.  . metoprolol succinate (TOPROL-XL) 100 MG 24 hr tablet TAKE 125MG  (1 AND 1/4 TAB) DAILY (Patient taking differently: Take 100 mg by mouth daily.  TAKE 100 MG DAILY)  . Multiple Vitamin (MULTIVITAMIN WITH MINERALS) TABS tablet Take 1 tablet by mouth daily. Centrum silver  . niacin (NIASPAN) 1000 MG CR tablet TAKE 1 TABLET(1000 MG) BY MOUTH AT BEDTIME  . nicotine polacrilex (COMMIT) 2 MG lozenge Take 2 mg by  mouth daily as needed for smoking cessation.   . nitroGLYCERIN (NITROSTAT) 0.4 MG SL tablet Place 1 tablet (0.4 mg total) under the tongue every 5 (five) minutes as needed.  . NON FORMULARY CPAP at night  . ondansetron (ZOFRAN) 8 MG tablet Take 1 tablet (8 mg total) by mouth every 8 (eight) hours as needed for nausea or vomiting.  [name] oxymetazoline (AFRIN) 0.05 % nasal spray Place 1 spray into both nostrils daily as needed for congestion.   . pantoprazole (PROTONIX) 40 MG tablet Take 1 tablet (40 mg total) by mouth daily.  . prochlorperazine (COMPAZINE) 10 MG tablet TAKE 1 TABLET(10 MG) BY MOUTH EVERY 6 HOURS AS NEEDED FOR NAUSEA OR VOMITING  . traMADol (ULTRAM) 50 MG tablet TAKE 1 TABLET(50 MG) BY MOUTH EVERY 12 HOURS AS NEEDED  . traZODone (DESYREL) 50 MG tablet TAKE 1/2 TO 1 TABLET BY MOUTH AT BEDTIME AS NEEDED FOR SLEEP  . VENTOLIN HFA 108 (90 Base) MCG/ACT inhaler INHALE 2 PUFFS INTO THE LUNGS EVERY 6 HOURS AS NEEDED FOR WHEEZING OR SHORTNESS OF BREATH  . XARELTO 20 MG TABS tablet TAKE 1 TABLET BY MOUTH EVERY DAY WITH SUPPER     No Known Allergies  Social History        Socioeconomic History  . Marital status: Divorced    Spouse name: Not on file  . Number of children: 2  . Years of education: 41  . Highest education level: Associate degree: academic program  Occupational History  . 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 Smoking status: Former Smoker    Packs/day: 1.50    Years: 30.00    Pack years: 45.00    Types: Cigarettes, Cigars    Quit date: 09/01/1994    Years since quitting: 25.9  . Smokeless tobacco: Never Used  . Tobacco comment: cigars quit  - 11/16/2019  Vaping Use  . Vaping Use: Never used  Substance and Sexual Activity  . Alcohol use: Not Currently  . Drug use: No  . Sexual activity: Yes  Other Topics Concern  . Not on file  Social History Narrative   Pt lives in [place] alone.  Divorced. 2 cups of  coffee in the morning.   Retired from [employer])   Social Determinants of Health      Financial Resource Strain:   . Difficulty of Paying Living Expenses: Not on file  Food Insecurity:   . Worried About Charity fundraiser in the Last Year: Not on file  . Ran Out of Food in the Last Year: Not on file  Transportation Needs:   . Lack of Transportation (Medical): Not on file  . Lack of Transportation (Non-Medical): Not on file  Physical Activity:   . Days of Exercise per Week: Not on file  . Minutes of Exercise per Session: Not on file  Stress:   . Feeling of Stress : Not on file  Social Connections:   . Frequency of Communication with Friends and Family: Not on file  . Frequency of Social Gatherings with Friends and Family: Not on file  . Attends Religious Services: Not on file  .  Active Member of Clubs or Organizations: Not on file  . Attends Archivist Meetings: Not on file  . Marital Status: Not on file  Intimate Partner Violence:   . Fear of Current or Ex-Partner: Not on file  . Emotionally Abused: Not on file  . Physically Abused: Not on file  . Sexually Abused: Not on file     Review of Systems: General: negative for chills, fever, night sweats or weight changes.  Cardiovascular: negative for chest pain, dyspnea on exertion, edema, orthopnea, palpitations, paroxysmal nocturnal dyspnea or shortness of breath Dermatological: negative for rash Respiratory: negative for cough or wheezing Urologic: negative for hematuria Abdominal: negative for nausea, vomiting, diarrhea, bright red blood per rectum, melena, or hematemesis Neurologic: negative for visual changes, syncope, or dizziness All other systems reviewed and are otherwise negative except as noted above.    Blood pressure 100/62, pulse 73, height 6' (1.829 m), weight 209 lb (94.8 kg).  General appearance: alert and no distress Neck: no adenopathy, no carotid bruit, no JVD,  supple, symmetrical, trachea midline and thyroid not enlarged, symmetric, no tenderness/mass/nodules Lungs: clear to auscultation bilaterally Heart: irregularly irregular rhythm Extremities: extremities normal, atraumatic, no cyanosis or edema Pulses: 2+ and symmetric Skin: Skin color, texture, turgor normal. No rashes or lesions Neurologic: Mental status: Alert, oriented, thought content appropriate  EKG atrial fibrillation with ventricular response of 73 with right bundle branch block.  I personally reviewed this EKG.  ASSESSMENT AND PLAN:   Paroxysmal atrial fibrillation (HCC) History of PAF status post remote ablation at [facility] as well as multiple DC cardioversions Gwenlyn Found is been on multiple antiarrhythmic medications as well.  His last cardioversion was performed by Dr. Johnsie Cancel 05/01/2020 which which was successful.  He was seen back in the A. fib clinic shortly thereafter in sinus rhythm.  Today he is back in A. fib but is unaware of that.  He is on Xarelto.  Status post coronary artery bypass grafting x 7, 11/16/1996 History of CAD status post CABG times 73/18/98.  His last Myoview performed 06/07/2013 showed inferolateral scar without ischemia.  He denies chest pain or shortness of breath.  Peripheral artery disease (HCC) History of peripheral arterial disease status post bilateral iliac artery PTA and stenting by myself remotely with restenting in 2007.  He is also had right renal artery PTA and stenting by myself as well.  He complains of left greater than right lower extremity lifestyle limiting claudication with recent Dopplers performed 07/25/2020 revealing right ABI of 0.87 and a left of 0.82.  He has high-frequency signal in his left external iliac artery which has progressed to moderately elevated velocity in the right external iliac artery as well.  He wishes to proceed with angiography potential intervention.  We will schedule this sometime in January.  He will need to hold his  Xarelto for 2 days prior to the procedure.  Hyperlipidemia associated with type 2 diabetes mellitus ([place]) History of hyperlipidemia on statin therapy with lipid profile performed 05/27/2018 revealing total cholesterol 99, LDL 48 and HDL 44.  Hypertension associated with diabetes ([place]) History of essential hypertension a blood pressure measured today 100/62.  He is on lisinopril, hydrochlorothiazide and metoprolol.  Ischemic cardiomyopathy History of ischemic cardiomyopathy with an EF by 2D echo performed 04/24/2020 of 40% without significant valvular abnormalities.  He is on appropriate medications and is asymptomatic.    Lorretta Harp, M.D., [place], [facility], [place] [address] [place],  [place]  [address]  [phone] 10/08/2020 7:31 AM

## 2020-10-08 NOTE — Discharge Instructions (Signed)
Resume Xarelto tomorrow 2/8   Angiogram, Care After This sheet gives you information about how to care for yourself after your procedure. Your health care provider may also give you more specific instructions. If you have problems or questions, contact your health care provider. What can I expect after the procedure? After the procedure, it is common to have:  Bruising and tenderness at the catheter insertion area.  A collection of blood (hematoma) at the insertion area. This may feel like a small lump under the skin at the insertion site. Follow these instructions at home: Insertion site care  Follow instructions from your health care provider about how to take care of your insertion site. Make sure you: ? Wash your hands with soap and water before and after you change your bandage (dressing). If soap and water are not available, use hand sanitizer. ? Change your dressing as told by your health care provider.  Do not take baths, swim, or use a hot tub until your health care provider approves.  You may shower 24-48 hours after the procedure, or as told by your health care provider. To clean the insertion site: ? Gently wash the area with plain soap and water. ? Pat the area dry with a clean towel. ? Do not rub the site. This may cause bleeding.  Check your insertion site every day for signs of infection. Check for: ? Redness, swelling, or pain. ? Fluid or blood. ? Warmth. ? Pus or a bad smell.  Do not apply powder or lotion to the site. Keep the site clean and dry.   Activity  Do not drive for 24 hours if you were given a sedative during your procedure.  Rest as told by your health care provider, usually for 1-2 days.  Do not lift anything that is heavier than 10 lb (4.5 kg), or the limit that you are told, until your health care provider says that it is safe.  If the insertion site was in your leg, try to avoid stairs for a few days.  Return to your normal activities as  told by your health care provider, usually in about a week. Ask your health care provider what activities are safe for you. General instructions  If your insertion site starts bleeding, lie flat and put pressure on the site. If the bleeding does not stop, get help right away. This is a medical emergency.  Take over-the-counter and prescription medicines only as told by your health care provider.  Drink enough fluid to keep your urine pale yellow. This helps flush the contrast dye from your body.  Keep all follow-up visits as told by your health care provider. This is important.   Contact a health care provider if:  You have a fever or chills.  You have redness, swelling, or pain around your insertion site.  You have fluid or blood coming from your insertion site.  Your insertion site feels warm to the touch.  You have pus or a bad smell coming from your insertion site.  You have more bruising around the insertion site. Get help right away if you have:  A problem with the insertion area, such as: ? The area swells fast or bleeds even after you apply pressure. ? The area becomes pale, cool, tingly, or numb.  Chest pain.  Trouble breathing.  A rash.  Any symptoms of a stroke. "BE FAST" is an easy way to remember the main warning signs of a stroke: ? B - Balance.  Signs are dizziness, sudden trouble walking, or loss of balance. ? E - Eyes. Signs are trouble seeing or a sudden change in vision. ? F - Face. Signs are sudden weakness or loss of feeling of the face, or the face or eyelid drooping on one side. ? A - Arms. Signs are weakness or loss of feeling in an arm. This happens suddenly and usually on one side of the body. ? S - Speech. Signs are sudden trouble speaking, slurred speech, or trouble understanding what people say. ? T - Time. Time to call emergency services. Write down what time symptoms started.  You have other signs of a stroke, such as: ? A sudden, severe  headache with no known cause. ? Nausea or vomiting. ? Seizure. These symptoms may represent a serious problem that is an emergency. Do not wait to see if the symptoms will go away. Get medical help right away. Call your local emergency services (911 in the [place] Do not drive yourself to the hospital. Summary  It is common to have bruising and tenderness at the catheter insertion area.  Do not take baths, swim, or use a hot tub until your health care provider approves. You may shower 24-48 hours after the procedure or as told.  It is important to rest and drink plenty of fluids.  If the insertion site bleeds, lie flat and put pressure on the site. If the bleeding continues, get help right away. This is a medical emergency. This information is not intended to replace advice given to you by your health care provider. Make sure you discuss any questions you have with your health care provider. Document Revised: 06/22/2019 Document Reviewed: 06/22/2019 Elsevier Patient Education  [place].

## 2020-10-09 MED FILL — Heparin Sod (Porcine)-NaCl IV Soln 1000 Unit/500ML-0.9%: INTRAVENOUS | Qty: 1000 | Status: AC

## 2020-10-17 ENCOUNTER — Encounter (HOSPITAL_COMMUNITY): Payer: Medicare Other

## 2020-10-23 ENCOUNTER — Other Ambulatory Visit: Payer: Self-pay

## 2020-10-23 ENCOUNTER — Encounter: Payer: Self-pay | Admitting: Cardiovascular Disease

## 2020-10-23 ENCOUNTER — Ambulatory Visit (INDEPENDENT_AMBULATORY_CARE_PROVIDER_SITE_OTHER): Payer: Medicare Other | Admitting: Cardiovascular Disease

## 2020-10-23 VITALS — BP 124/62 | HR 66 | Ht 72.0 in | Wt 206.0 lb

## 2020-10-23 DIAGNOSIS — I4819 Other persistent atrial fibrillation: Secondary | ICD-10-CM

## 2020-10-23 DIAGNOSIS — I480 Paroxysmal atrial fibrillation: Secondary | ICD-10-CM | POA: Diagnosis not present

## 2020-10-23 NOTE — Progress Notes (Signed)
10/23/2020 [name]   Oct 12, 1945  [id number]  Primary Physician Richard Nutting, DO Primary Cardiologist: Lorretta Harp MD FACP, [place], [place], [place]  HPI:  [name] is a 75 y.o.  moderately overweight, divorced, Caucasian male father of 2, grandfather of 1 grandchild who I saw 08/08/2020.He has a history of CAD status post coronary artery bypass grafting x7 November 16, 1996. His other problems include PVOD status post bilateral iliac artery PTA and stenting by myself remotely with restenting in 2007. He has had right renal artery PTA and stenting as well. His other problems include hypertension, hyperlipidemia, non-insulin-requiring diabetes. He does have paroxysmal atrial fibrillation and has undergone multiple DC cardioversions as well as atrial fibrillation ablations by Dr. Samara Deist at [facility], most recently in May of last year, though he is now back in atrial fibrillation/flutter on a daily basis, which he is aware of but not symptomatic from. He is active and works out on a treadmill every day. He denies chest pain or shortness of breath. His last stress test performed one year ago which revealed an inferolateral scar without ischemia.  Since I saw him at 9 months ago has remained clinically stable. He denies chest pain, shortness of breath or claudication. He works out on a treadmill or plays golf daily. He is a Myoview stress test performed in October 2014 that showed inferolateral scar without ischemia unchanged from prior studies. Recent arterial Doppler studies of his lower extremities revealed ABIs in the 0.9 range bilaterally with patent iliac stents.he's noticed tachycardia over the last several months which on EKG yesterday is suggested A. Fib with RVR. He has been on Coumadin anticoagulation and has been therapeutic. I performed outpatient DC cardioversion on him 03/15/15 successfully back to sinus rhythm. He saw Dr. Rayann Heman subsequent to that and was placed on Multaq  antiarrhythmic therapy. His Coumadin was switched to Xarelto .   He was diagnosis with small cell lung cancer unfortunately.  Chest CT performed 11/16/2019 revealing nodule and biopsy confirmed small cell cancer.  Is been treated with chemotherapy and radiation therapy.  He also underwent cardioversion by Dr. Johnsie Cancel 05/01/2020 successfully after 3 shocks to sinus rhythm and was seen in the A. fib clinic shortly thereafter maintaining sinus rhythm.  Unfortunately, he is in A. fib today with controlled ventricular response although he is unaware that.  He also complains of bilateral lower extremity claudication left greater than right which is lifestyle limiting with Dopplers to suggest high-grade disease in both right and left common and external iliac arteries.    I performed peripheral angiography on him 10/08/2020 revealing patent external [name] bilaterally with mild in-stent restenosis and three-vessel runoff bilaterally.  I could not find an anatomic correlate to his Doppler abnormalities nor can I find anything that would be causing him claudication.   Current Meds  Medication Sig  . acetaminophen (TYLENOL) 500 MG tablet Take 1,000 mg by mouth every 6 (six) hours as needed for mild pain or moderate pain.  [name] atorvastatin (LIPITOR) 10 MG tablet Take 1 tablet (10 mg total) by mouth daily.  . budesonide-formoterol (SYMBICORT) 160-4.5 MCG/ACT inhaler Inhale 2 puffs into the lungs 2 (two) times daily. (Patient taking differently: Inhale 2 puffs into the lungs 2 (two) times daily as needed (shortness of breath).)  . dronedarone (MULTAQ) 400 MG tablet Take 400 mg by mouth 2 (two) times daily.  . empagliflozin (JARDIANCE) 10 MG TABS tablet Take 10 mg by mouth daily.  [name]  gabapentin (NEURONTIN) 300 MG capsule TAKE 2 CAPSULES(600 MG) BY MOUTH AT BEDTIME (Patient taking differently: Take 600 mg by mouth every evening.)  . ketotifen (ZADITOR) 0.025 % ophthalmic solution Place 1 drop into both eyes 2 (two)  times daily as needed (allergies).  . metoprolol succinate (TOPROL-XL) 100 MG 24 hr tablet TAKE 125MG  (1 AND 1/4 TAB) DAILY (Patient taking differently: Take 100 mg by mouth daily.)  . Multiple Vitamin (MULTIVITAMIN WITH MINERALS) TABS tablet Take 1 tablet by mouth daily. Centrum silver  . niacin (NIASPAN) 1000 MG CR tablet TAKE 1 TABLET(1000 MG) BY MOUTH AT BEDTIME (Patient taking differently: Take 1,000 mg by mouth at bedtime.)  . nicotine polacrilex (COMMIT) 2 MG lozenge Take 2 mg by mouth daily as needed for smoking cessation.   . nitroGLYCERIN (NITROSTAT) 0.4 MG SL tablet Place 1 tablet (0.4 mg total) under the tongue every 5 (five) minutes as needed. (Patient taking differently: Place 0.4 mg under the tongue every 5 (five) minutes as needed for chest pain.)  . NON FORMULARY CPAP at night  . ondansetron (ZOFRAN) 8 MG tablet Take 1 tablet (8 mg total) by mouth every 8 (eight) hours as needed for nausea or vomiting.  [name] oxymetazoline (AFRIN) 0.05 % nasal spray Place 1 spray into both nostrils daily as needed for congestion.   . prochlorperazine (COMPAZINE) 10 MG tablet TAKE 1 TABLET(10 MG) BY MOUTH EVERY 6 HOURS AS NEEDED FOR NAUSEA OR VOMITING  . sertraline (ZOLOFT) 50 MG tablet Take 1 tablet (50 mg total) by mouth daily. Take 1/2 tab x1 week then increase to full tab (Patient taking differently: Take 50 mg by mouth daily.)  . traMADol (ULTRAM) 50 MG tablet TAKE 1 TABLET(50 MG) BY MOUTH EVERY 12 HOURS AS NEEDED (Patient taking differently: Take 50 mg by mouth every 12 (twelve) hours as needed for moderate pain. TAKE 1 TABLET(50 MG) BY MOUTH EVERY 12 HOURS AS NEEDED)  . traZODone (DESYREL) 50 MG tablet TAKE 1/2 TO 1 TABLET BY MOUTH AT BEDTIME AS NEEDED FOR SLEEP (Patient taking differently: Take 50 mg by mouth at bedtime.)  . VENTOLIN HFA 108 (90 Base) MCG/ACT inhaler INHALE 2 PUFFS INTO THE LUNGS EVERY 6 HOURS AS NEEDED FOR WHEEZING OR SHORTNESS OF BREATH (Patient taking differently: Inhale 2 puffs  into the lungs every 6 (six) hours as needed.)  . XARELTO 20 MG TABS tablet TAKE 1 TABLET BY MOUTH EVERY DAY WITH SUPPER (Patient taking differently: Take 20 mg by mouth daily with supper.)   Current Facility-Administered Medications for the 10/23/20 encounter (Office Visit) with Lorretta Harp, MD  Medication  . sodium chloride flush (NS) 0.9 % injection 3 mL     No Known Allergies  Social History   Socioeconomic History  . Marital status: Divorced    Spouse name: Not on file  . Number of children: 2  . Years of education: 6  . Highest education level: Associate degree: academic program  Occupational History  . 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 Smoking status: Former Smoker    Packs/day: 1.50    Years: 30.00    Pack years: 45.00    Types: Cigarettes, Cigars    Quit date: 09/01/1994    Years since quitting: 26.1  . Smokeless tobacco: Never Used  . Tobacco comment: cigars quit  - 11/16/2019  Vaping Use  . Vaping Use: Never used  Substance and Sexual Activity  . Alcohol use: Not Currently  .  Drug use: No  . Sexual activity: Yes  Other Topics Concern  . Not on file  Social History Narrative   Pt lives in [place] alone.  Divorced. 2 cups of coffee in the morning.   Retired from [employer])   Social Determinants of Health   Financial Resource Strain: Not on file  Food Insecurity: Not on file  Transportation Needs: Not on file  Physical Activity: Not on file  Stress: Not on file  Social Connections: Not on file  Intimate Partner Violence: Not on file     Review of Systems: General: negative for chills, fever, night sweats or weight changes.  Cardiovascular: negative for chest pain, dyspnea on exertion, edema, orthopnea, palpitations, paroxysmal nocturnal dyspnea or shortness of breath Dermatological: negative for rash Respiratory: negative for cough or wheezing Urologic: negative for hematuria Abdominal:  negative for nausea, vomiting, diarrhea, bright red blood per rectum, melena, or hematemesis Neurologic: negative for visual changes, syncope, or dizziness All other systems reviewed and are otherwise negative except as noted above.    Blood pressure 124/62, pulse 66, height 6' (1.829 m), weight 206 lb (93.4 kg).  General appearance: alert and no distress Neck: no adenopathy, no carotid bruit, no JVD, supple, symmetrical, trachea midline and thyroid not enlarged, symmetric, no tenderness/mass/nodules Lungs: clear to auscultation bilaterally Heart: irregularly irregular rhythm Extremities: extremities normal, atraumatic, no cyanosis or edema Pulses: 2+ and symmetric Skin: Skin color, texture, turgor normal. No rashes or lesions Neurologic: Alert and oriented X 3, normal strength and tone. Normal symmetric reflexes. Normal coordination and gait  EKG atrial fibrillation with ventricular sponsor 66 and right bundle branch block.  Personally reviewed this EKG.  ASSESSMENT AND PLAN:   Peripheral artery disease [facility]) Mr. Plotts returns today for follow-up of his recent angiogram which I performed 10/08/2020.  This revealed external iliac artery stents bilaterally with mild in-stent restenosis and three-vessel runoff.  Interestingly, his Doppler studies performed 07/25/2020 revealed a right ABI of 0.87 and a left of 0.82.  Suggested moderate to severe disease in both external iliac arteries which I could not confirm on angiography.      Lorretta Harp MD FACP,FACC,FAHA, [facility] 10/23/2020 3:08 PM

## 2020-10-23 NOTE — Assessment & Plan Note (Signed)
[name] returns today for follow-up of his recent angiogram which I performed 10/08/2020.  This revealed external iliac artery stents bilaterally with mild in-stent restenosis and three-vessel runoff.  Interestingly, his Doppler studies performed 07/25/2020 revealed a right ABI of 0.87 and a left of 0.82.  Suggested moderate to severe disease in both external iliac arteries which I could not confirm on angiography.

## 2020-10-23 NOTE — Patient Instructions (Signed)
Medication Instructions:  Your physician recommends that you continue on your current medications as directed. Please refer to the Current Medication list given to you today.  *If you need a refill on your cardiac medications before your next appointment, please call your pharmacy*   Testing/Procedures: Your physician has requested that you have a lower extremity arterial duplex. This test is an ultrasound of the arteries in the legs. It looks at arterial blood flow in the legs. Allow one hour for Lower Arterial scans. There are no restrictions or special instructions.   Your physician has requested that you have an ankle brachial index (ABI). During this test an ultrasound and blood pressure cuff are used to evaluate the arteries that supply the arms and legs with blood. Allow thirty minutes for this exam. There are no restrictions or special instructions.  These procedures are done at [facility]. 2nd Floor- to be done in Dec 2022.    Follow-Up: At [facility], you and your health needs are our priority.  As part of our continuing mission to provide you with exceptional heart care, we have created designated Provider Care Teams.  These Care Teams include your primary Cardiologist (physician) and Advanced Practice Providers (APPs -  Physician Assistants and Nurse Practitioners) who all work together to provide you with the care you need, when you need it.  We recommend signing up for the patient portal called "MyChart".  Sign up information is provided on this After Visit Summary.  MyChart is used to connect with patients for Virtual Visits (Telemedicine).  Patients are able to view lab/test results, encounter notes, upcoming appointments, etc.  Non-urgent messages can be sent to your provider as well.   To learn more about what you can do with MyChart, go to [email or web address].    Your next appointment:   6 month(s)  The format for your next appointment:   In  Person  Provider:   You will see one of the following Advanced Practice Providers on your designated Care Team:    Kerin Ransom, PA-C  [place], [place]  Coletta Memos, [place]  Then, Quay Burow, MD will plan to see you again in 12 month(s).

## 2020-11-01 ENCOUNTER — Encounter: Payer: Self-pay | Admitting: Internal Medicine

## 2020-11-04 ENCOUNTER — Other Ambulatory Visit: Payer: Self-pay | Admitting: Cardiovascular Disease

## 2020-11-20 ENCOUNTER — Ambulatory Visit (HOSPITAL_COMMUNITY)
Admission: RE | Admit: 2020-11-20 | Discharge: 2020-11-20 | Disposition: A | Discharge status: Home / Self Care | Payer: Medicare Other | Source: Ambulatory Visit | Attending: Physician Assistant | Admitting: Physician Assistant

## 2020-11-20 ENCOUNTER — Other Ambulatory Visit: Payer: Self-pay

## 2020-11-20 ENCOUNTER — Inpatient Hospital Stay: Payer: Medicare Other | Attending: Physician Assistant

## 2020-11-20 DIAGNOSIS — E1151 Type 2 diabetes mellitus with diabetic peripheral angiopathy without gangrene: Secondary | ICD-10-CM | POA: Insufficient documentation

## 2020-11-20 DIAGNOSIS — E119 Type 2 diabetes mellitus without complications: Secondary | ICD-10-CM | POA: Insufficient documentation

## 2020-11-20 DIAGNOSIS — J9811 Atelectasis: Secondary | ICD-10-CM | POA: Diagnosis not present

## 2020-11-20 DIAGNOSIS — C3411 Malignant neoplasm of upper lobe, right bronchus or lung: Secondary | ICD-10-CM | POA: Insufficient documentation

## 2020-11-20 DIAGNOSIS — Z923 Personal history of irradiation: Secondary | ICD-10-CM | POA: Insufficient documentation

## 2020-11-20 DIAGNOSIS — I10 Essential (primary) hypertension: Secondary | ICD-10-CM | POA: Insufficient documentation

## 2020-11-20 DIAGNOSIS — Z7901 Long term (current) use of anticoagulants: Secondary | ICD-10-CM | POA: Diagnosis not present

## 2020-11-20 DIAGNOSIS — J449 Chronic obstructive pulmonary disease, unspecified: Secondary | ICD-10-CM | POA: Insufficient documentation

## 2020-11-20 DIAGNOSIS — Z87442 Personal history of urinary calculi: Secondary | ICD-10-CM | POA: Diagnosis not present

## 2020-11-20 DIAGNOSIS — Z9221 Personal history of antineoplastic chemotherapy: Secondary | ICD-10-CM | POA: Insufficient documentation

## 2020-11-20 DIAGNOSIS — G4733 Obstructive sleep apnea (adult) (pediatric): Secondary | ICD-10-CM | POA: Insufficient documentation

## 2020-11-20 DIAGNOSIS — I252 Old myocardial infarction: Secondary | ICD-10-CM | POA: Insufficient documentation

## 2020-11-20 DIAGNOSIS — Z79899 Other long term (current) drug therapy: Secondary | ICD-10-CM | POA: Diagnosis not present

## 2020-11-20 DIAGNOSIS — I2510 Atherosclerotic heart disease of native coronary artery without angina pectoris: Secondary | ICD-10-CM | POA: Diagnosis not present

## 2020-11-20 DIAGNOSIS — E785 Hyperlipidemia, unspecified: Secondary | ICD-10-CM | POA: Insufficient documentation

## 2020-11-20 DIAGNOSIS — J439 Emphysema, unspecified: Secondary | ICD-10-CM | POA: Insufficient documentation

## 2020-11-20 DIAGNOSIS — J90 Pleural effusion, not elsewhere classified: Secondary | ICD-10-CM | POA: Diagnosis not present

## 2020-11-20 DIAGNOSIS — C3490 Malignant neoplasm of unspecified part of unspecified bronchus or lung: Secondary | ICD-10-CM | POA: Diagnosis not present

## 2020-11-20 LAB — CBC WITH DIFFERENTIAL (CANCER CENTER ONLY)
Abs Immature Granulocytes: 0.01 10*3/uL (ref 0.00–0.07)
Basophils Absolute: 0 10*3/uL (ref 0.0–0.1)
Basophils Relative: 1 %
Eosinophils Absolute: 0.2 10*3/uL (ref 0.0–0.5)
Eosinophils Relative: 5 %
HCT: 33.5 % — ABNORMAL LOW (ref 39.0–52.0)
Hemoglobin: 9.8 g/dL — ABNORMAL LOW (ref 13.0–17.0)
Immature Granulocytes: 0 %
Lymphocytes Relative: 29 %
Lymphs Abs: 1.4 10*3/uL (ref 0.7–4.0)
MCH: 24.6 pg — ABNORMAL LOW (ref 26.0–34.0)
MCHC: 29.3 g/dL — ABNORMAL LOW (ref 30.0–36.0)
MCV: 84 fL (ref 80.0–100.0)
Monocytes Absolute: 0.6 10*3/uL (ref 0.1–1.0)
Monocytes Relative: 11 %
Neutro Abs: 2.6 10*3/uL (ref 1.7–7.7)
Neutrophils Relative %: 54 %
Platelet Count: 183 10*3/uL (ref 150–400)
RBC: 3.99 MIL/uL — ABNORMAL LOW (ref 4.22–5.81)
RDW: 18.6 % — ABNORMAL HIGH (ref 11.5–15.5)
WBC Count: 4.9 10*3/uL (ref 4.0–10.5)
nRBC: 0 % (ref 0.0–0.2)

## 2020-11-20 LAB — CMP (CANCER CENTER ONLY)
ALT: 9 U/L (ref 0–44)
AST: 15 U/L (ref 15–41)
Albumin: 2.7 g/dL — ABNORMAL LOW (ref 3.5–5.0)
Alkaline Phosphatase: 92 U/L (ref 38–126)
Anion gap: 7 (ref 5–15)
BUN: 7 mg/dL — ABNORMAL LOW (ref 8–23)
CO2: 29 mmol/L (ref 22–32)
Calcium: 8.6 mg/dL — ABNORMAL LOW (ref 8.9–10.3)
Chloride: 105 mmol/L (ref 98–111)
Creatinine: 0.96 mg/dL (ref 0.61–1.24)
GFR, Estimated: >60 mL/min (ref >60)
Glucose, Bld: 96 mg/dL (ref 70–99)
Potassium: 4.2 mmol/L (ref 3.5–5.1)
Sodium: 141 mmol/L (ref 135–145)
Total Bilirubin: 0.5 mg/dL (ref 0.3–1.2)
Total Protein: 5.2 g/dL — ABNORMAL LOW (ref 6.5–8.1)

## 2020-11-20 MED ORDER — IOHEXOL 300 MG/ML  SOLN
75.0000 mL | Freq: Once | INTRAMUSCULAR | Status: AC | PRN
  Administered 2020-11-20: 75 mL via INTRAVENOUS

## 2020-11-22 ENCOUNTER — Encounter: Payer: Self-pay | Admitting: Internal Medicine

## 2020-11-22 ENCOUNTER — Inpatient Hospital Stay: Payer: Medicare Other | Admitting: Internal Medicine

## 2020-11-22 ENCOUNTER — Other Ambulatory Visit: Payer: Self-pay

## 2020-11-22 VITALS — BP 96/60 | HR 86 | Temp 97.8°F | Resp 13 | Ht 72.0 in | Wt 206.4 lb

## 2020-11-22 DIAGNOSIS — Z79899 Other long term (current) drug therapy: Secondary | ICD-10-CM | POA: Diagnosis not present

## 2020-11-22 DIAGNOSIS — Z87442 Personal history of urinary calculi: Secondary | ICD-10-CM | POA: Diagnosis not present

## 2020-11-22 DIAGNOSIS — Z7901 Long term (current) use of anticoagulants: Secondary | ICD-10-CM | POA: Diagnosis not present

## 2020-11-22 DIAGNOSIS — C3490 Malignant neoplasm of unspecified part of unspecified bronchus or lung: Secondary | ICD-10-CM | POA: Diagnosis not present

## 2020-11-22 DIAGNOSIS — J449 Chronic obstructive pulmonary disease, unspecified: Secondary | ICD-10-CM | POA: Diagnosis not present

## 2020-11-22 DIAGNOSIS — C3411 Malignant neoplasm of upper lobe, right bronchus or lung: Secondary | ICD-10-CM | POA: Diagnosis not present

## 2020-11-22 DIAGNOSIS — Z9221 Personal history of antineoplastic chemotherapy: Secondary | ICD-10-CM | POA: Diagnosis not present

## 2020-11-22 DIAGNOSIS — E119 Type 2 diabetes mellitus without complications: Secondary | ICD-10-CM | POA: Diagnosis not present

## 2020-11-22 DIAGNOSIS — I10 Essential (primary) hypertension: Secondary | ICD-10-CM | POA: Diagnosis not present

## 2020-11-22 DIAGNOSIS — Z923 Personal history of irradiation: Secondary | ICD-10-CM | POA: Diagnosis not present

## 2020-11-22 DIAGNOSIS — G4733 Obstructive sleep apnea (adult) (pediatric): Secondary | ICD-10-CM | POA: Diagnosis not present

## 2020-11-22 DIAGNOSIS — E1151 Type 2 diabetes mellitus with diabetic peripheral angiopathy without gangrene: Secondary | ICD-10-CM | POA: Diagnosis not present

## 2020-11-22 DIAGNOSIS — E785 Hyperlipidemia, unspecified: Secondary | ICD-10-CM | POA: Diagnosis not present

## 2020-11-22 DIAGNOSIS — I252 Old myocardial infarction: Secondary | ICD-10-CM | POA: Diagnosis not present

## 2020-11-22 DIAGNOSIS — J439 Emphysema, unspecified: Secondary | ICD-10-CM | POA: Diagnosis not present

## 2020-11-22 DIAGNOSIS — I2510 Atherosclerotic heart disease of native coronary artery without angina pectoris: Secondary | ICD-10-CM | POA: Diagnosis not present

## 2020-11-22 NOTE — Progress Notes (Signed)
[place] [phone]   [phone]) [phone]  OFFICE PROGRESS NOTE  Richard Nutting, DO [place] [address]  DIAGNOSIS: limited stage, stage Ia (T1b, N0, M0) small cell lung cancer presented with right upper lobe pulmonary nodule that is hypermetabolic and biopsy proven to be consistent with small cell carcinoma diagnosed in April 2021.  PRIOR THERAPY:  1) Concurrent chemoradiation with carboplatin for an AUC of 5 on days 1 and etoposide 100 mg/m2 on days 1, 2, and 3 IV every 3 weeks.  First dose received on 01/10/2020.   Status post 4 cycles of treatment.  This was concurrent with radiotherapy to the right upper lobe pulmonary nodule with SBRT. 2) prophylactic cranial irradiation under the care of Dr. Lisbeth Renshaw completed May 28, 2020.  CURRENT THERAPY:  Observation.  INTERVAL HISTORY: [name] 75 y.o. male returns to the clinic today for follow-up visit.  The patient is feeling fine today with no concerning complaints.  He denied having any chest pain, shortness of breath but has mild cough with no hemoptysis.  He was diagnosed with COVID-19 in January 2022.  He denied having any current nausea, vomiting, diarrhea or constipation.  He has no headache or visual changes.  He denied having any weight loss or night sweats.  The patient is here today for evaluation with repeat CT scan of the chest for restaging of his disease.   MEDICAL HISTORY: Past Medical History:  Diagnosis Date  . Atypical atrial flutter ([place])   . Cancer [facility])    Testicular- surgery   . COPD (chronic obstructive pulmonary disease) ([place])   . Coronary artery disease   . Diabetes mellitus without complication ([place])   . Diastolic dysfunction, left ventricle 05/2018   Dr. Rayann Heman- Cardiologist  . Dysrhythmia    chronic AFib  . History of kidney stones    passed  . HLD (hyperlipidemia)   . HTN (hypertension)   . Myocardial infarction  ([place])    x 2 maybe 1 more  . OSA (obstructive sleep apnea)    uses CPAP  . Peripheral artery disease ([place])    pseudoaneurysm post afib ablation at [facility] 2011, s/p bilateral iliac stents  . Persistent atrial fibrillation ([place])   . Pre-diabetes   . Renal artery stenosis ([facility])    right renal artery PTA and stenting  . S/P coronary artery bypass graft x 7 11/16/96    ALLERGIES:  has No Known Allergies.  MEDICATIONS:  Current Outpatient Medications  Medication Sig Dispense Refill  . acetaminophen (TYLENOL) 500 MG tablet Take 1,000 mg by mouth every 6 (six) hours as needed for mild pain or moderate pain.    [name] atorvastatin (LIPITOR) 10 MG tablet Take 1 tablet (10 mg total) by mouth daily. 90 tablet 3  . budesonide-formoterol (SYMBICORT) 160-4.5 MCG/ACT inhaler Inhale 2 puffs into the lungs 2 (two) times daily. (Patient taking differently: Inhale 2 puffs into the lungs 2 (two) times daily as needed (shortness of breath).) 10.2 g 3  . dronedarone (MULTAQ) 400 MG tablet Take 400 mg by mouth 2 (two) times daily.    . empagliflozin (JARDIANCE) 10 MG TABS tablet Take 10 mg by mouth daily.    [name] gabapentin (NEURONTIN) 300 MG capsule TAKE 2 CAPSULES(600 MG) BY MOUTH AT BEDTIME (Patient taking differently: Take 600 mg by mouth every evening.) 180 capsule 2  . ketotifen (ZADITOR) 0.025 % ophthalmic solution Place 1 drop into  both eyes 2 (two) times daily as needed (allergies).    . metoprolol succinate (TOPROL-XL) 100 MG 24 hr tablet TAKE 1 TABLET(100 MG) BY MOUTH DAILY 90 tablet 3  . Multiple Vitamin (MULTIVITAMIN WITH MINERALS) TABS tablet Take 1 tablet by mouth daily. Centrum silver    . niacin (NIASPAN) 1000 MG CR tablet TAKE 1 TABLET(1000 MG) BY MOUTH AT BEDTIME (Patient taking differently: Take 1,000 mg by mouth at bedtime.) 90 tablet 3  . nicotine polacrilex (COMMIT) 2 MG lozenge Take 2 mg by mouth daily as needed for smoking cessation.     . nitroGLYCERIN (NITROSTAT) 0.4 MG SL tablet Place 1 tablet  (0.4 mg total) under the tongue every 5 (five) minutes as needed. (Patient taking differently: Place 0.4 mg under the tongue every 5 (five) minutes as needed for chest pain.) 25 tablet 3  . NON FORMULARY CPAP at night    . ondansetron (ZOFRAN) 8 MG tablet Take 1 tablet (8 mg total) by mouth every 8 (eight) hours as needed for nausea or vomiting. 30 tablet 1  . oxymetazoline (AFRIN) 0.05 % nasal spray Place 1 spray into both nostrils daily as needed for congestion.     . pantoprazole (PROTONIX) 40 MG tablet Take 1 tablet (40 mg total) by mouth daily. 30 tablet 2  . prochlorperazine (COMPAZINE) 10 MG tablet TAKE 1 TABLET(10 MG) BY MOUTH EVERY 6 HOURS AS NEEDED FOR NAUSEA OR VOMITING 30 tablet 0  . sertraline (ZOLOFT) 50 MG tablet Take 1 tablet (50 mg total) by mouth daily. Take 1/2 tab x1 week then increase to full tab (Patient taking differently: Take 50 mg by mouth daily.) 30 tablet 3  . traMADol (ULTRAM) 50 MG tablet TAKE 1 TABLET(50 MG) BY MOUTH EVERY 12 HOURS AS NEEDED (Patient taking differently: Take 50 mg by mouth every 12 (twelve) hours as needed for moderate pain. TAKE 1 TABLET(50 MG) BY MOUTH EVERY 12 HOURS AS NEEDED) 30 tablet 3  . traZODone (DESYREL) 50 MG tablet TAKE 1/2 TO 1 TABLET BY MOUTH AT BEDTIME AS NEEDED FOR SLEEP (Patient taking differently: Take 50 mg by mouth at bedtime.) 90 tablet 3  . VENTOLIN HFA 108 (90 Base) MCG/ACT inhaler INHALE 2 PUFFS INTO THE LUNGS EVERY 6 HOURS AS NEEDED FOR WHEEZING OR SHORTNESS OF BREATH (Patient taking differently: Inhale 2 puffs into the lungs every 6 (six) hours as needed.) 18 g 1  . XARELTO 20 MG TABS tablet TAKE 1 TABLET BY MOUTH EVERY DAY WITH SUPPER (Patient taking differently: Take 20 mg by mouth daily with supper.) 90 tablet 1   Current Facility-Administered Medications  Medication Dose Route Frequency Provider Last Rate Last Admin  . sodium chloride flush (NS) 0.9 % injection 3 mL  3 mL Intravenous Q12H Lorretta Harp, MD         SURGICAL HISTORY:  Past Surgical History:  Procedure Laterality Date  . 2-D echocardiogram  03/25/2010   Normal left ventricular function. Mild MR, TR, trivial AR  . ABDOMINAL AORTOGRAM W/LOWER EXTREMITY Bilateral 10/08/2020   Procedure: ABDOMINAL AORTOGRAM W/LOWER EXTREMITY;  Surgeon: Lorretta Harp, MD;  Location: [facility];  Service: Cardiovascular;  Laterality: Bilateral;  . ATRIAL FIBRILLATION ABLATION  03/27/2010, 12/31/2010   [facility], Dr. Nadeen Landau  . BACK SURGERY  2004, 2007   Laminectomy, Discedectomy x 2  . BRONCHIAL BIOPSY  12/27/2019   Procedure: BRONCHIAL BIOPSIES;  Surgeon: Garner Nash, DO;  Location: [facility];  Service: Pulmonary;;  . BRONCHIAL BRUSHINGS  12/27/2019   Procedure: BRONCHIAL BRUSHINGS;  Surgeon: Garner Nash, DO;  Location: [facility];  Service: Pulmonary;;  . BRONCHIAL NEEDLE ASPIRATION BIOPSY  12/27/2019   Procedure: BRONCHIAL NEEDLE ASPIRATION BIOPSIES;  Surgeon: Garner Nash, DO;  Location: [facility];  Service: Pulmonary;;  . cardiac stress test  11/21/2009   Exercise capacity 5 METS. No significant ischemia demonstrated  . CARDIOVERSION N/A 03/15/2015   Procedure: CARDIOVERSION;  Surgeon: Lorretta Harp, MD;  Location: [facility];  Service: Cardiovascular;  Laterality: N/A;  . CARDIOVERSION N/A 07/10/2017   Procedure: CARDIOVERSION;  Surgeon: Pixie Casino, MD;  Location: [facility] END[facility]COPY;  Service: Cardiovascular;  Laterality: N/A;  . CARDIOVERSION N/A 03/23/2018   Procedure: CARDIOVERSION;  Surgeon: Sanda Klein, MD;  Location: [facility];  Service: Cardiovascular;  Laterality: N/A;  . CARDIOVERSION N/A 05/01/2020   Procedure: CARDIOVERSION;  Surgeon: Josue Hector, MD;  Location: [facility] ENDOSCOPY;  Service: Cardiovascular;  Laterality: N/A;  . COLONOSCOPY WITH PROPOFOL N/A 04/05/2020   Procedure: COLONOSCOPY WITH PROPOFOL;  Surgeon: Carol Ada, MD;  Location: [facility]COPY;  Service: Endoscopy;  Laterality: N/A;  .  CORONARY ARTERY BYPASS GRAFT  1998  . ELECTROMAGNETIC NAVIGATION BROCHOSCOPY  12/27/2019   Procedure: NAVIGATION BRONCHOSCOPY;  Surgeon: Garner Nash, DO;  Location: [facility];  Service: Pulmonary;;  . ESOPHAGOGASTRODUODENOSCOPY (EGD) WITH PROPOFOL N/A 04/05/2020   Procedure: ESOPHAGOGASTRODUODENOSCOPY (EGD) WITH PROPOFOL;  Surgeon: Carol Ada, MD;  Location: [facility];  Service: Endoscopy;  Laterality: N/A;  . FIDUCIAL MARKER PLACEMENT  12/27/2019   Procedure: FIDUCIAL MARKER PLACEMENT;  Surgeon: Garner Nash, DO;  Location: [facility];  Service: Pulmonary;;  . HOT HEMOSTASIS N/A 04/05/2020   Procedure: HOT HEMOSTASIS (ARGON PLASMA COAGULATION/BICAP);  Surgeon: Carol Ada, MD;  Location: [facility];  Service: Endoscopy;  Laterality: N/A;  . [place]   for cancer  . POLYPECTOMY  04/05/2020   Procedure: POLYPECTOMY;  Surgeon: Carol Ada, MD;  Location: [facility];  Service: Endoscopy;;  . TOOTH EXTRACTION  05/27/2013   tooth extraction with bone graft- several -for Implants  . VIDEO BRONCHOSCOPY WITH ENDOBRONCHIAL NAVIGATION N/A 12/27/2019   Procedure: VIDEO BRONCHOSCOPY;  Surgeon: Garner Nash, DO;  Location: [facility];  Service: Pulmonary;  Laterality: N/A;    REVIEW OF SYSTEMS:  A comprehensive review of systems was negative except for: Respiratory: positive for cough   PHYSICAL EXAMINATION: General appearance: alert, cooperative and no distress Head: Normocephalic, without obvious abnormality, atraumatic Neck: no adenopathy, no JVD, supple, symmetrical, trachea midline and thyroid not enlarged, symmetric, no tenderness/mass/nodules Lymph nodes: Cervical, supraclavicular, and axillary nodes normal. Resp: clear to auscultation bilaterally Back: symmetric, no curvature. ROM normal. No CVA tenderness. Cardio: regular rate and rhythm, S1, S2 normal, no murmur, click, rub or gallop GI: soft, non-tender; bowel sounds normal; no masses,  no  organomegaly Extremities: extremities normal, atraumatic, no cyanosis or edema  ECOG PERFORMANCE STATUS: 1 - Symptomatic but completely ambulatory  Blood pressure 96/60, pulse 86, temperature 97.8 F (36.6 C), temperature source Tympanic, resp. rate 13, height 6' (1.829 m), weight 206 lb 6.4 oz (93.6 kg), SpO2 100 %.  LABORATORY DATA: Lab Results  Component Value Date   WBC 4.9 11/20/2020   HGB 9.8 (L) 11/20/2020   HCT 33.5 (L) 11/20/2020   MCV 84.0 11/20/2020   PLT 183 11/20/2020      Chemistry      Component Value Date/Time   NA 141 11/20/2020 0855   NA 145 (H) 10/04/2020 1559  K 4.2 11/20/2020 0855   CL 105 11/20/2020 0855   CO2 29 11/20/2020 0855   BUN 7 (L) 11/20/2020 0855   BUN 8 10/04/2020 1559   CREATININE 0.96 11/20/2020 0855   CREATININE 1.41 (H) 09/14/2019 0943      Component Value Date/Time   CALCIUM 8.6 (L) 11/20/2020 0855   ALKPHOS 92 11/20/2020 0855   AST 15 11/20/2020 0855   ALT 9 11/20/2020 0855   BILITOT 0.5 11/20/2020 0855       RADIOGRAPHIC STUDIES: CT Chest W Contrast  Result Date: 11/21/2020 CLINICAL DATA:  Non-small-cell lung cancer.  Restaging. EXAM: CT CHEST WITH CONTRAST TECHNIQUE: Multidetector CT imaging of the chest was performed during intravenous contrast administration. CONTRAST:  22mL OMNIPAQUE IOHEXOL 300 MG/ML  SOLN COMPARISON:  08/20/2020. FINDINGS: Cardiovascular: The heart size is normal. No substantial pericardial effusion. Coronary artery calcification is evident. Atherosclerotic calcification is noted in the wall of the thoracic aorta. Status post CABG. Mediastinum/Nodes: No mediastinal lymphadenopathy. There is no hilar lymphadenopathy. The esophagus has normal imaging features. There is no axillary lymphadenopathy. Lungs/Pleura: Centrilobular emphsyema noted. Interval evolution/progression of post radiation fibrosis in the medial suprahilar right lung, in the region of the fiducial markers. Tiny nodule measured previously has  become incorporated into the of all vein scar. Tiny subpleural nodule in the right middle lobe (93/7) is stable in the interval. Interval development of atelectasis in the dependent lung bases bilaterally with new small bilateral pleural effusions, right slightly more than left. Upper Abdomen: Nodular liver contour suggests cirrhosis although liver has been incompletely visualized. Similar thickening of both adrenal glands. Gallbladder not included on today's exam. Musculoskeletal: No worrisome lytic or sclerotic osseous abnormality. Mild superior endplate compression deformity at T12 is stable. IMPRESSION: 1. Interval evolution/progression of consolidative opacity in the medial suprahilar right lung, presumably reflecting evolution of post radiation scarring. These changes are in the region of the fiducial markers and obscure the tiny nodule measured on the previous study. Close attention on follow-up recommended to exclude recurrence. 2. Interval development of dependent atelectasis in both lower lobes with new small bilateral pleural effusions, right slightly more than left. 3. Nodular liver contour suggests cirrhosis although liver has been incompletely visualized. 4. Aortic Atherosclerosis (ICD10-I70.0) and Emphysema (ICD10-J43.9). Electronically Signed   By: Misty Stanley M.D.   On: 11/21/2020 07:55    ASSESSMENT AND PLAN: This is a very pleasant 75 years old white male with limited stage, stage Ia small cell lung cancer presented with right upper lobe pulmonary nodule diagnosed in April 2021. The patient underwent a course of systemic chemotherapy with carboplatin and etoposide in addition to SBRT to the right upper lobe lung nodule.  Status post 4 cycles. This was followed by prophylactic cranial irradiation. The patient is currently on observation and he is feeling fine today with no concerning complaints. He had repeat CT scan of the chest performed recently.  I personally and independently reviewed  the scan images and discussed the results with the patient today. Has a scan showed interval evolution/progression of consolidative opacity in the medial suprahilar right lung secondary to radiation treatment but could be also complication from his recent COVID-19 infection.  The patient also has interval development of dependent atelectasis in both lower lobes with new small bilateral pleural effusion. I recommended for the patient to have repeat CT scan of the chest in around 2 months for further evaluation of this area and if there is any further progression, we may consider repeat biopsy  with bronchoscopy or thoracentesis if the fluid is larger in size. He agreed to the current plan. He was advised to call immediately if he has any other concerning symptoms in the interval. The patient voices understanding of current disease status and treatment options and is in agreement with the current care plan.  All questions were answered. The patient knows to call the clinic with any problems, questions or concerns. We can certainly see the patient much sooner if necessary.  Disclaimer: This note was dictated with voice recognition software. Similar sounding words can inadvertently be transcribed and may not be corrected upon review.

## 2020-12-07 ENCOUNTER — Telehealth: Payer: Self-pay | Admitting: Cardiovascular Disease

## 2020-12-07 NOTE — Telephone Encounter (Signed)
Returned call to [name] with [employer]  Jardiance/DM managed by PCP in the past.   Advised on med list at last appt, no changes made by Dr. Gwenlyn Found.  Defer to PCP.

## 2020-12-07 NOTE — Telephone Encounter (Signed)
Pt c/o medication issue:  1. Name of Medication: empagliflozin (JARDIANCE) 10 MG TABS tablet   2. How are you currently taking this medication (dosage and times per day)? Patient has not been taking this medication  3. Are you having a reaction (difficulty breathing--STAT)? No   4. What is your medication issue?   Baxter Flattery with [employer] states the patient has not taken this medication since April of 2021. She would like to ensure that Dr. Gwenlyn Found discontinued it. If so, she would like to know if Dr. Gwenlyn Found put him on an alternative to regulate his diabetes. Please advise.  Phone #: [phone]  (opt: 2)

## 2020-12-13 ENCOUNTER — Other Ambulatory Visit: Payer: Self-pay | Admitting: Family Medicine

## 2020-12-13 ENCOUNTER — Other Ambulatory Visit: Payer: Self-pay | Admitting: Cardiovascular Disease

## 2020-12-13 DIAGNOSIS — I480 Paroxysmal atrial fibrillation: Secondary | ICD-10-CM

## 2020-12-14 NOTE — Telephone Encounter (Signed)
Prescription refill request for Xarelto received.  Indication:atrial fib Last office visit:2/22  berry Weight:93.6 kg Age:75 Scr:0.96  3/22 CrCl:92.08 ml/min  Prescription refilled

## 2021-01-08 IMAGING — MR MR HEAD WO/W CM
14 of 15 series · 47 of 48 positions shown · IV contrast (gadavist)
Comparison: MRI January 25, 2020

CLINICAL DATA: Small cell lung cancer staging.

EXAM:
MRI HEAD WITHOUT AND WITH CONTRAST
TECHNIQUE: Multiplanar, multiecho pulse sequences of the brain and surrounding
structures were obtained without and with intravenous contrast.
CONTRAST:  9mL GADAVIST GADOBUTROL 1 MMOL/ML IV SOLN

[Series 5: DWI · axial · 3.0mm · 1.36mm/px · z∈[-27,+132]mm · 5 of 108 slices shown (1 of 4)]
[im 1/108]
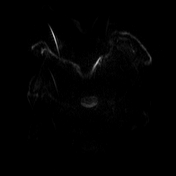
[im 27/108]
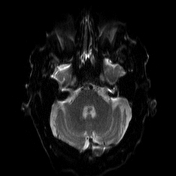
[im 54/108]
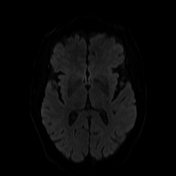
[im 81/108]
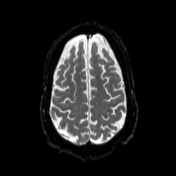
[im 108/108]
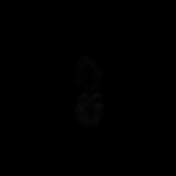

[Series 6: DWI · axial · 3.0mm · 1.36mm/px · z∈[-27,+132]mm · 3 of 50 slices shown (2 of 4)]
[im 1/50]
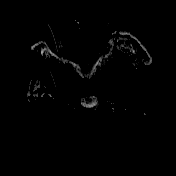
[im 25/50]
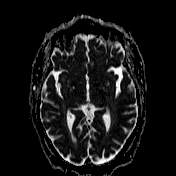
[im 50/50]
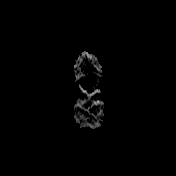

[Series 7: T1 · sagittal · 5.0mm · 0.75mm/px · 1 of 22 slices shown (1 of 2)]
[im 1/22]
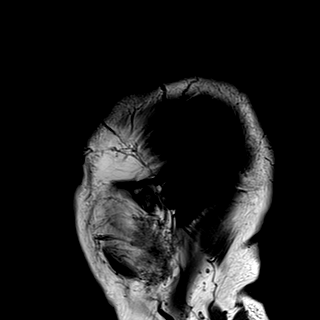

[Series 8: T2 · axial · 5.0mm · 0.62mm/px · z∈[-39,+142]mm · 2 of 29 slices shown]
[im 1/29]
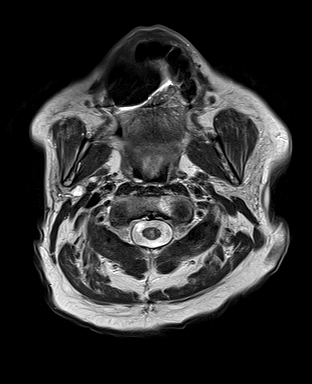
[im 29/29]
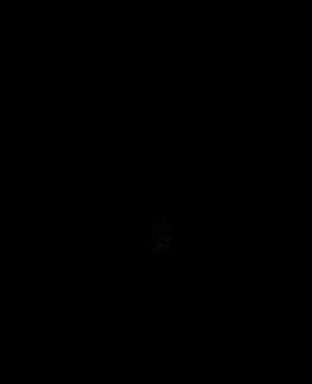

[Series 9: mip_images(sw) · axial · 24.0mm · 0.75mm/px · z∈[-26,+129]mm · 3 of 53 slices shown]
[im 1/53]
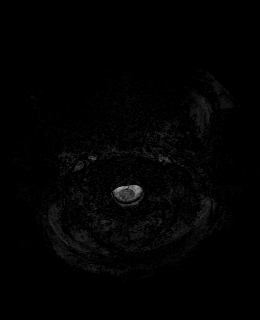
[im 27/53]
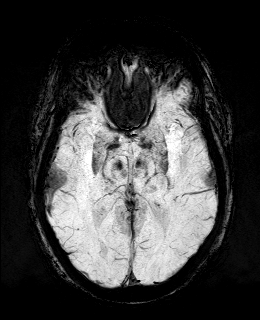
[im 53/53]
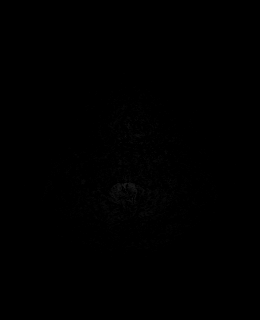

[Series 10: swi_images · axial · 3.0mm · 0.75mm/px · z∈[-37,+139]mm · 3 of 60 slices shown]
[im 1/60]
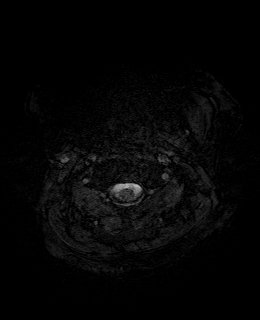
[im 30/60]
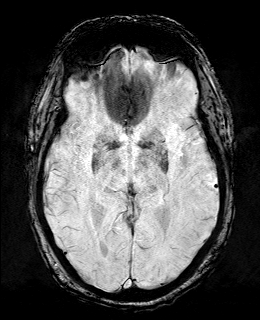
[im 60/60]
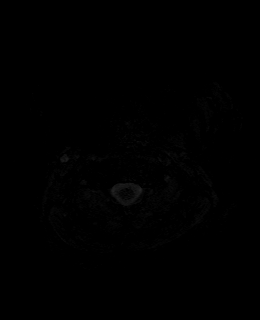

[Series 11: FLAIR · axial · 3.0mm · 0.75mm/px · z∈[-28,+130]mm · 3 of 54 slices shown]
[im 1/54]
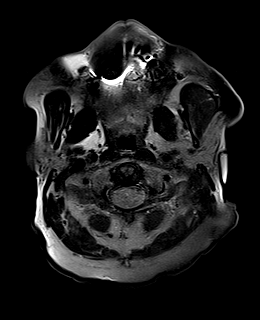
[im 27/54]
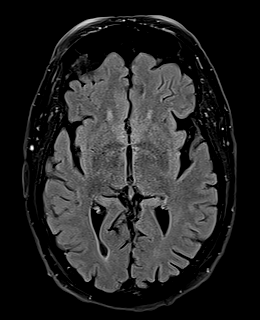
[im 54/54]
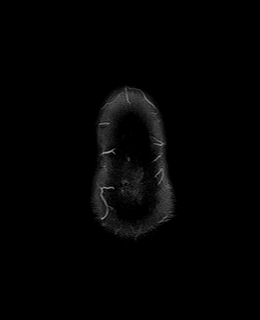

[Series 12: T1 · axial · 1.0mm · 0.94mm/px · z∈[-25,+133]mm · 8 of 160 slices shown (2 of 2)]
[im 1/160]
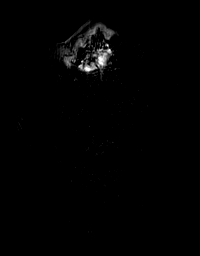
[im 23/160]
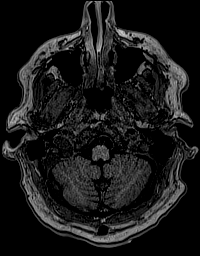
[im 46/160]
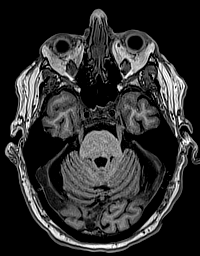
[im 69/160]
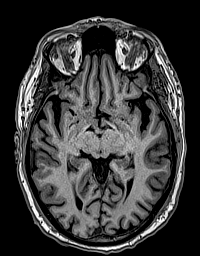
[im 91/160]
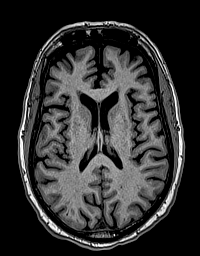
[im 114/160]
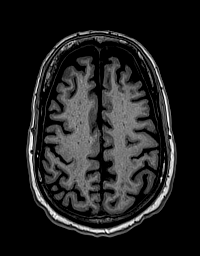
[im 137/160]
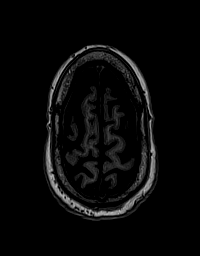
[im 160/160]
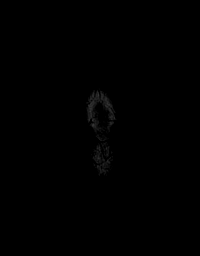

[Series 13: DWI · coronal · 5.0mm · 1.31mm/px · 4 of 80 slices shown (3 of 4)]
[im 1/80]
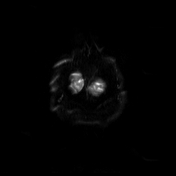
[im 27/80]
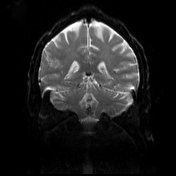
[im 53/80]
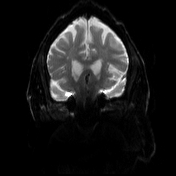
[im 80/80]
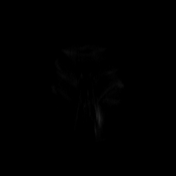

[Series 14: DWI · coronal · 5.0mm · 1.31mm/px · 2 of 40 slices shown (4 of 4)]
[im 1/40]
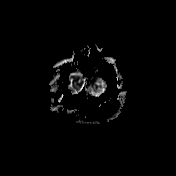
[im 40/40]
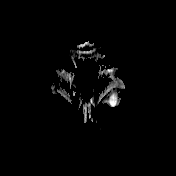

[Series 15: T2 post-contrast · coronal · 5.0mm · 0.57mm/px · 2 of 32 slices shown]
[im 1/32]
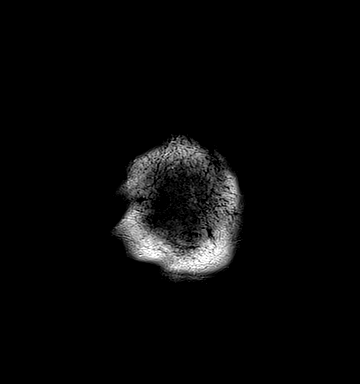
[im 32/32]
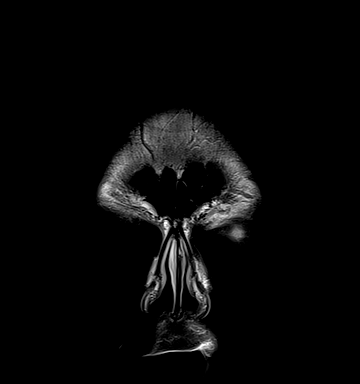

[Series 16: T1 post-contrast · axial · 1.0mm · 0.94mm/px · z∈[-25,+133]mm · 8 of 160 slices shown (1 of 3)]
[im 1/160]
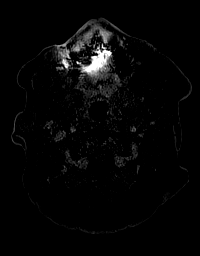
[im 23/160]
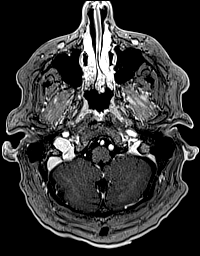
[im 46/160]
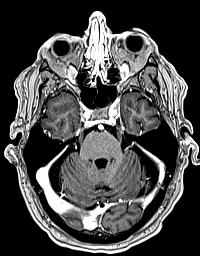
[im 69/160]
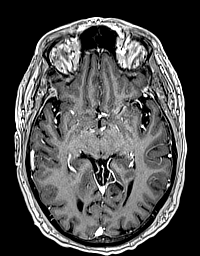
[im 91/160]
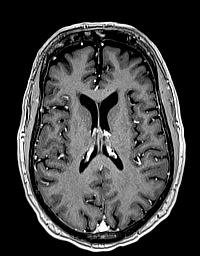
[im 114/160]
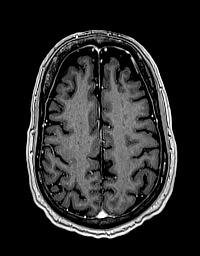
[im 137/160]
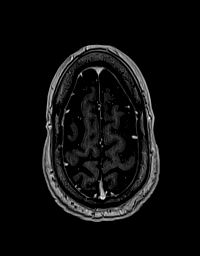
[im 160/160]
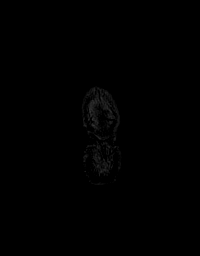

[Series 17: T1 post-contrast · coronal · 5.0mm · 0.43mm/px · 2 of 32 slices shown (2 of 3)]
[im 1/32]
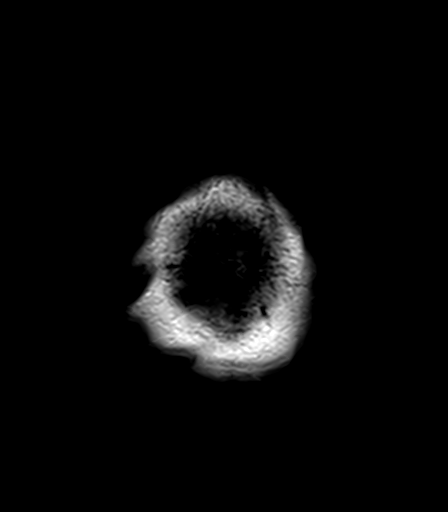
[im 32/32]
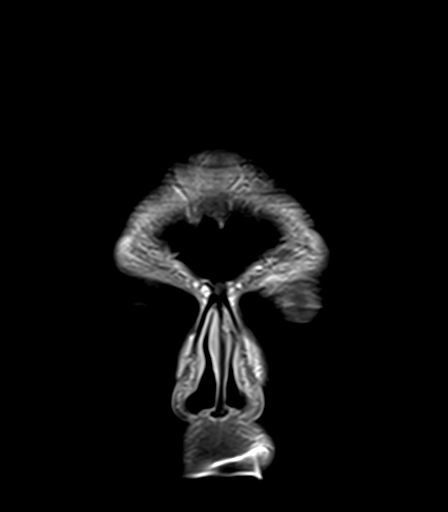

[Series 18: T1 post-contrast · sagittal · 5.0mm · 0.75mm/px · 1 of 22 slices shown (3 of 3)]
[im 1/22]
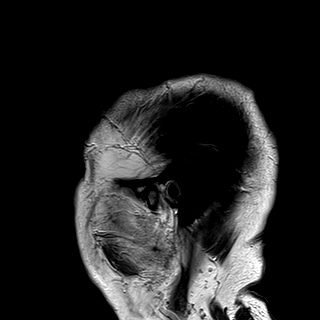

[47 of 48 positions shown; findings below may reference images not displayed]

FINDINGS: Brain: No acute infarction, hemorrhage, hydrocephalus, extra-axial
collection or mass lesion. No abnormal enhancement. Similar
scattered T2/FLAIR hyperintensities within the white matter,
nonspecific but most likely secondary to chronic microvascular
ischemic disease.

Vascular: Normal flow voids.

Skull and upper cervical spine: No focal marrow replacing lesion.
Similar diffuse T1 hypointensity of the marrow.

Sinuses/Orbits: Negative.

Other: Trace bilateral mastoid effusions.
IMPRESSION: 1. No acute intracranial abnormality. No evidence of intracranial
metastatic disease.
2. Similar chronic microvascular ischemic disease.

## 2021-01-16 ENCOUNTER — Telehealth: Payer: Self-pay | Admitting: Internal Medicine

## 2021-01-16 NOTE — Telephone Encounter (Signed)
R/s appt per 5/18 sch msg. Pt aware.

## 2021-01-18 DIAGNOSIS — G4733 Obstructive sleep apnea (adult) (pediatric): Secondary | ICD-10-CM | POA: Diagnosis not present

## 2021-01-21 ENCOUNTER — Other Ambulatory Visit: Payer: Self-pay

## 2021-01-21 ENCOUNTER — Inpatient Hospital Stay: Payer: Medicare Other | Attending: Physician Assistant

## 2021-01-21 ENCOUNTER — Ambulatory Visit (HOSPITAL_COMMUNITY)
Admission: RE | Admit: 2021-01-21 | Discharge: 2021-01-21 | Disposition: A | Discharge status: Home / Self Care | Payer: Medicare Other | Source: Ambulatory Visit | Attending: Internal Medicine | Admitting: Internal Medicine

## 2021-01-21 DIAGNOSIS — C3490 Malignant neoplasm of unspecified part of unspecified bronchus or lung: Secondary | ICD-10-CM

## 2021-01-21 DIAGNOSIS — G4730 Sleep apnea, unspecified: Secondary | ICD-10-CM | POA: Insufficient documentation

## 2021-01-21 DIAGNOSIS — Z87442 Personal history of urinary calculi: Secondary | ICD-10-CM | POA: Diagnosis not present

## 2021-01-21 DIAGNOSIS — Z923 Personal history of irradiation: Secondary | ICD-10-CM | POA: Diagnosis not present

## 2021-01-21 DIAGNOSIS — E785 Hyperlipidemia, unspecified: Secondary | ICD-10-CM | POA: Insufficient documentation

## 2021-01-21 DIAGNOSIS — E119 Type 2 diabetes mellitus without complications: Secondary | ICD-10-CM | POA: Diagnosis not present

## 2021-01-21 DIAGNOSIS — I4891 Unspecified atrial fibrillation: Secondary | ICD-10-CM | POA: Insufficient documentation

## 2021-01-21 DIAGNOSIS — J984 Other disorders of lung: Secondary | ICD-10-CM | POA: Diagnosis not present

## 2021-01-21 DIAGNOSIS — Z7901 Long term (current) use of anticoagulants: Secondary | ICD-10-CM | POA: Diagnosis not present

## 2021-01-21 DIAGNOSIS — I2510 Atherosclerotic heart disease of native coronary artery without angina pectoris: Secondary | ICD-10-CM | POA: Diagnosis not present

## 2021-01-21 DIAGNOSIS — K8020 Calculus of gallbladder without cholecystitis without obstruction: Secondary | ICD-10-CM | POA: Insufficient documentation

## 2021-01-21 DIAGNOSIS — J449 Chronic obstructive pulmonary disease, unspecified: Secondary | ICD-10-CM | POA: Diagnosis not present

## 2021-01-21 DIAGNOSIS — I10 Essential (primary) hypertension: Secondary | ICD-10-CM | POA: Diagnosis not present

## 2021-01-21 DIAGNOSIS — Z79899 Other long term (current) drug therapy: Secondary | ICD-10-CM | POA: Insufficient documentation

## 2021-01-21 DIAGNOSIS — C3411 Malignant neoplasm of upper lobe, right bronchus or lung: Secondary | ICD-10-CM | POA: Insufficient documentation

## 2021-01-21 DIAGNOSIS — I252 Old myocardial infarction: Secondary | ICD-10-CM | POA: Insufficient documentation

## 2021-01-21 DIAGNOSIS — J90 Pleural effusion, not elsewhere classified: Secondary | ICD-10-CM | POA: Diagnosis not present

## 2021-01-21 DIAGNOSIS — J9811 Atelectasis: Secondary | ICD-10-CM | POA: Diagnosis not present

## 2021-01-21 LAB — CMP (CANCER CENTER ONLY)
ALT: 9 U/L (ref 0–44)
AST: 18 U/L (ref 15–41)
Albumin: 2.7 g/dL — ABNORMAL LOW (ref 3.5–5.0)
Alkaline Phosphatase: 91 U/L (ref 38–126)
Anion gap: 7 (ref 5–15)
BUN: 8 mg/dL (ref 8–23)
CO2: 28 mmol/L (ref 22–32)
Calcium: 8.7 mg/dL — ABNORMAL LOW (ref 8.9–10.3)
Chloride: 108 mmol/L (ref 98–111)
Creatinine: 0.8 mg/dL (ref 0.61–1.24)
GFR, Estimated: >60 mL/min (ref >60)
Glucose, Bld: 110 mg/dL — ABNORMAL HIGH (ref 70–99)
Potassium: 3.6 mmol/L (ref 3.5–5.1)
Sodium: 143 mmol/L (ref 135–145)
Total Bilirubin: 0.6 mg/dL (ref 0.3–1.2)
Total Protein: 5.2 g/dL — ABNORMAL LOW (ref 6.5–8.1)

## 2021-01-21 LAB — CBC WITH DIFFERENTIAL (CANCER CENTER ONLY)
Abs Immature Granulocytes: 0.01 10*3/uL (ref 0.00–0.07)
Basophils Absolute: 0 10*3/uL (ref 0.0–0.1)
Basophils Relative: 1 %
Eosinophils Absolute: 0.3 10*3/uL (ref 0.0–0.5)
Eosinophils Relative: 6 %
HCT: 32.7 % — ABNORMAL LOW (ref 39.0–52.0)
Hemoglobin: 10 g/dL — ABNORMAL LOW (ref 13.0–17.0)
Immature Granulocytes: 0 %
Lymphocytes Relative: 30 %
Lymphs Abs: 1.4 10*3/uL (ref 0.7–4.0)
MCH: 25.9 pg — ABNORMAL LOW (ref 26.0–34.0)
MCHC: 30.6 g/dL (ref 30.0–36.0)
MCV: 84.7 fL (ref 80.0–100.0)
Monocytes Absolute: 0.6 10*3/uL (ref 0.1–1.0)
Monocytes Relative: 12 %
Neutro Abs: 2.4 10*3/uL (ref 1.7–7.7)
Neutrophils Relative %: 51 %
Platelet Count: 178 10*3/uL (ref 150–400)
RBC: 3.86 MIL/uL — ABNORMAL LOW (ref 4.22–5.81)
RDW: 18.7 % — ABNORMAL HIGH (ref 11.5–15.5)
WBC Count: 4.7 10*3/uL (ref 4.0–10.5)
nRBC: 0 % (ref 0.0–0.2)

## 2021-01-21 MED ORDER — SODIUM CHLORIDE (PF) 0.9 % IJ SOLN
INTRAMUSCULAR | Status: AC
  Filled 2021-01-21: qty 50

## 2021-01-21 MED ORDER — IOHEXOL 300 MG/ML  SOLN
75.0000 mL | Freq: Once | INTRAMUSCULAR | Status: AC | PRN
  Administered 2021-01-21: 75 mL via INTRAVENOUS

## 2021-01-23 ENCOUNTER — Inpatient Hospital Stay: Payer: Medicare Other

## 2021-01-23 ENCOUNTER — Encounter: Payer: Self-pay | Admitting: Family Medicine

## 2021-01-23 ENCOUNTER — Encounter: Payer: Self-pay | Admitting: Internal Medicine

## 2021-01-23 ENCOUNTER — Inpatient Hospital Stay (HOSPITAL_BASED_OUTPATIENT_CLINIC_OR_DEPARTMENT_OTHER): Payer: Medicare Other | Admitting: Internal Medicine

## 2021-01-23 ENCOUNTER — Other Ambulatory Visit: Payer: Self-pay

## 2021-01-23 VITALS — BP 102/52 | HR 67 | Temp 97.3°F | Resp 18 | Ht 72.0 in | Wt 198.1 lb

## 2021-01-23 DIAGNOSIS — C3411 Malignant neoplasm of upper lobe, right bronchus or lung: Secondary | ICD-10-CM | POA: Diagnosis not present

## 2021-01-23 DIAGNOSIS — J90 Pleural effusion, not elsewhere classified: Secondary | ICD-10-CM | POA: Diagnosis not present

## 2021-01-23 DIAGNOSIS — C3490 Malignant neoplasm of unspecified part of unspecified bronchus or lung: Secondary | ICD-10-CM | POA: Diagnosis not present

## 2021-01-23 DIAGNOSIS — I252 Old myocardial infarction: Secondary | ICD-10-CM | POA: Diagnosis not present

## 2021-01-23 DIAGNOSIS — E119 Type 2 diabetes mellitus without complications: Secondary | ICD-10-CM | POA: Diagnosis not present

## 2021-01-23 DIAGNOSIS — K8020 Calculus of gallbladder without cholecystitis without obstruction: Secondary | ICD-10-CM | POA: Diagnosis not present

## 2021-01-23 DIAGNOSIS — Z7901 Long term (current) use of anticoagulants: Secondary | ICD-10-CM | POA: Diagnosis not present

## 2021-01-23 DIAGNOSIS — E785 Hyperlipidemia, unspecified: Secondary | ICD-10-CM | POA: Diagnosis not present

## 2021-01-23 DIAGNOSIS — Z79899 Other long term (current) drug therapy: Secondary | ICD-10-CM | POA: Diagnosis not present

## 2021-01-23 DIAGNOSIS — I4891 Unspecified atrial fibrillation: Secondary | ICD-10-CM | POA: Diagnosis not present

## 2021-01-23 DIAGNOSIS — I10 Essential (primary) hypertension: Secondary | ICD-10-CM | POA: Diagnosis not present

## 2021-01-23 DIAGNOSIS — Z923 Personal history of irradiation: Secondary | ICD-10-CM | POA: Diagnosis not present

## 2021-01-23 DIAGNOSIS — G4730 Sleep apnea, unspecified: Secondary | ICD-10-CM | POA: Diagnosis not present

## 2021-01-23 DIAGNOSIS — I2510 Atherosclerotic heart disease of native coronary artery without angina pectoris: Secondary | ICD-10-CM | POA: Diagnosis not present

## 2021-01-23 DIAGNOSIS — Z87442 Personal history of urinary calculi: Secondary | ICD-10-CM | POA: Diagnosis not present

## 2021-01-23 DIAGNOSIS — J449 Chronic obstructive pulmonary disease, unspecified: Secondary | ICD-10-CM | POA: Diagnosis not present

## 2021-01-23 NOTE — Progress Notes (Signed)
[place] [phone]   [phone]) [phone]  OFFICE PROGRESS NOTE  Luetta Nutting, DO [address] [place] [id number]  DIAGNOSIS: Limited stage, stage IA (T1b, N0, M0) small cell lung cancer presented with right upper lobe pulmonary nodule that is hypermetabolic and biopsy proven to be consistent with small cell carcinoma diagnosed in April 2021.  PRIOR THERAPY:  1) Concurrent chemoradiation with carboplatin for an AUC of 5 on days 1 and etoposide 100 mg/m2 on days 1, 2, and 3 IV every 3 weeks.  First dose received on 01/10/2020.   Status post 4 cycles of treatment.  This was concurrent with radiotherapy to the right upper lobe pulmonary nodule with SBRT. 2) prophylactic cranial irradiation under the care of Dr. Lisbeth Renshaw completed May 28, 2020.  CURRENT THERAPY:  Observation.  INTERVAL HISTORY: [name] 75 y.o. male returns to the clinic today for follow-up visit.  The patient is feeling fine today with no concerning complaints except for mild fatigue and chronic back pain.  He denied having any chest pain, shortness of breath, cough or hemoptysis.  He denied having any fever or chills.  He has no nausea, vomiting, diarrhea or constipation.  He has no headache or visual changes.  He is here today for evaluation with repeat CT scan of the chest for restaging of his disease.   MEDICAL HISTORY: Past Medical History:  Diagnosis Date  . Atypical atrial flutter ([place])   . Cancer [facility])    Testicular- surgery   . COPD (chronic obstructive pulmonary disease) ([place])   . Coronary artery disease   . Diabetes mellitus without complication ([place])   . Diastolic dysfunction, left ventricle 05/2018   Dr. Rayann Heman- Cardiologist  . Dysrhythmia    chronic AFib  . History of kidney stones    passed  . HLD (hyperlipidemia)   . HTN (hypertension)   . Myocardial infarction ([place])    x 2 maybe 1 more  . OSA (obstructive sleep apnea)     uses CPAP  . Peripheral artery disease ([place])    pseudoaneurysm post afib ablation at [facility] 2011, s/p bilateral iliac stents  . Persistent atrial fibrillation ([place])   . Pre-diabetes   . Renal artery stenosis ([facility])    right renal artery PTA and stenting  . S/P coronary artery bypass graft x 7 11/16/96    ALLERGIES:  has No Known Allergies.  MEDICATIONS:  Current Outpatient Medications  Medication Sig Dispense Refill  . sertraline (ZOLOFT) 50 MG tablet Take 1 tablet (50 mg total) by mouth daily. 90 tablet 1  . acetaminophen (TYLENOL) 500 MG tablet Take 1,000 mg by mouth every 6 (six) hours as needed for mild pain or moderate pain.    [name] atorvastatin (LIPITOR) 10 MG tablet Take 1 tablet (10 mg total) by mouth daily. 90 tablet 3  . budesonide-formoterol (SYMBICORT) 160-4.5 MCG/ACT inhaler Inhale 2 puffs into the lungs 2 (two) times daily. (Patient taking differently: Inhale 2 puffs into the lungs 2 (two) times daily as needed (shortness of breath).) 10.2 g 3  . dronedarone (MULTAQ) 400 MG tablet Take 400 mg by mouth 2 (two) times daily.    . empagliflozin (JARDIANCE) 10 MG TABS tablet Take 10 mg by mouth daily.    [name] gabapentin (NEURONTIN) 300 MG capsule TAKE 2 CAPSULES(600 MG) BY MOUTH AT BEDTIME (Patient taking differently: Take 600 mg by mouth every evening.) 180 capsule 2  . ketotifen (  ZADITOR) 0.025 % ophthalmic solution Place 1 drop into both eyes 2 (two) times daily as needed (allergies).    . metoprolol succinate (TOPROL-XL) 100 MG 24 hr tablet TAKE 1 TABLET(100 MG) BY MOUTH DAILY 90 tablet 3  . Multiple Vitamin (MULTIVITAMIN WITH MINERALS) TABS tablet Take 1 tablet by mouth daily. Centrum silver    . niacin (NIASPAN) 1000 MG CR tablet TAKE 1 TABLET(1000 MG) BY MOUTH AT BEDTIME (Patient taking differently: Take 1,000 mg by mouth at bedtime.) 90 tablet 3  . nicotine polacrilex (COMMIT) 2 MG lozenge Take 2 mg by mouth daily as needed for smoking cessation.     . nitroGLYCERIN (NITROSTAT) 0.4  MG SL tablet Place 1 tablet (0.4 mg total) under the tongue every 5 (five) minutes as needed. (Patient taking differently: Place 0.4 mg under the tongue every 5 (five) minutes as needed for chest pain.) 25 tablet 3  . NON FORMULARY CPAP at night    . ondansetron (ZOFRAN) 8 MG tablet Take 1 tablet (8 mg total) by mouth every 8 (eight) hours as needed for nausea or vomiting. 30 tablet 1  . oxymetazoline (AFRIN) 0.05 % nasal spray Place 1 spray into both nostrils daily as needed for congestion.     . pantoprazole (PROTONIX) 40 MG tablet Take 1 tablet (40 mg total) by mouth daily. 30 tablet 2  . prochlorperazine (COMPAZINE) 10 MG tablet TAKE 1 TABLET(10 MG) BY MOUTH EVERY 6 HOURS AS NEEDED FOR NAUSEA OR VOMITING 30 tablet 0  . traMADol (ULTRAM) 50 MG tablet TAKE 1 TABLET(50 MG) BY MOUTH EVERY 12 HOURS AS NEEDED (Patient taking differently: Take 50 mg by mouth every 12 (twelve) hours as needed for moderate pain. TAKE 1 TABLET(50 MG) BY MOUTH EVERY 12 HOURS AS NEEDED) 30 tablet 3  . traZODone (DESYREL) 50 MG tablet TAKE 1/2 TO 1 TABLET BY MOUTH AT BEDTIME AS NEEDED FOR SLEEP (Patient taking differently: Take 50 mg by mouth at bedtime.) 90 tablet 3  . VENTOLIN HFA 108 (90 Base) MCG/ACT inhaler INHALE 2 PUFFS INTO THE LUNGS EVERY 6 HOURS AS NEEDED FOR WHEEZING OR SHORTNESS OF BREATH (Patient taking differently: Inhale 2 puffs into the lungs every 6 (six) hours as needed.) 18 g 1  . XARELTO 20 MG TABS tablet TAKE 1 TABLET BY MOUTH EVERY DAY WITH SUPPER 90 tablet 1   Current Facility-Administered Medications  Medication Dose Route Frequency Provider Last Rate Last Admin  . sodium chloride flush (NS) 0.9 % injection 3 mL  3 mL Intravenous Q12H Lorretta Harp, MD        SURGICAL HISTORY:  Past Surgical History:  Procedure Laterality Date  . 2-D echocardiogram  03/25/2010   Normal left ventricular function. Mild MR, TR, trivial AR  . ABDOMINAL AORTOGRAM W/LOWER EXTREMITY Bilateral 10/08/2020   Procedure:  ABDOMINAL AORTOGRAM W/LOWER EXTREMITY;  Surgeon: Lorretta Harp, MD;  Location: [facility];  Service: Cardiovascular;  Laterality: Bilateral;  . ATRIAL FIBRILLATION ABLATION  03/27/2010, 12/31/2010   [facility], Dr. Nadeen Landau  . BACK SURGERY  2004, 2007   Laminectomy, Discedectomy x 2  . BRONCHIAL BIOPSY  12/27/2019   Procedure: BRONCHIAL BIOPSIES;  Surgeon: Garner Nash, DO;  Location: [facility];  Service: Pulmonary;;  . BRONCHIAL BRUSHINGS  12/27/2019   Procedure: BRONCHIAL BRUSHINGS;  Surgeon: Garner Nash, DO;  Location: [place] [facility];  Service: Pulmonary;;  . BRONCHIAL NEEDLE ASPIRATION BIOPSY  12/27/2019   Procedure: BRONCHIAL NEEDLE ASPIRATION BIOPSIES;  Surgeon: Garner Nash,  DO;  Location: [facility];  Service: Pulmonary;;  . cardiac stress test  11/21/2009   Exercise capacity 5 METS. No significant ischemia demonstrated  . CARDIOVERSION N/A 03/15/2015   Procedure: CARDIOVERSION;  Surgeon: Lorretta Harp, MD;  Location: [facility];  Service: Cardiovascular;  Laterality: N/A;  . CARDIOVERSION N/A 07/10/2017   Procedure: CARDIOVERSION;  Surgeon: Pixie Casino, MD;  Location: [facility];  Service: Cardiovascular;  Laterality: N/A;  . CARDIOVERSION N/A 03/23/2018   Procedure: CARDIOVERSION;  Surgeon: Sanda Klein, MD;  Location: [facility];  Service: Cardiovascular;  Laterality: N/A;  . CARDIOVERSION N/A 05/01/2020   Procedure: CARDIOVERSION;  Surgeon: Josue Hector, MD;  Location: [facility];  Service: Cardiovascular;  Laterality: N/A;  . COLONOSCOPY WITH PROPOFOL N/A 04/05/2020   Procedure: COLONOSCOPY WITH PROPOFOL;  Surgeon: Carol Ada, MD;  Location: [facility];  Service: Endoscopy;  Laterality: N/A;  . CORONARY ARTERY BYPASS GRAFT  1998  . ELECTROMAGNETIC NAVIGATION BROCHOSCOPY  12/27/2019   Procedure: NAVIGATION BRONCHOSCOPY;  Surgeon: Garner Nash, DO;  Location: [place] [facility];  Service: Pulmonary;;  . ESOPHAGOGASTRODUODENOSCOPY (EGD) WITH  PROPOFOL N/A 04/05/2020   Procedure: ESOPHAGOGASTRODUODENOSCOPY (EGD) WITH PROPOFOL;  Surgeon: Carol Ada, MD;  Location: [facility];  Service: Endoscopy;  Laterality: N/A;  . FIDUCIAL MARKER PLACEMENT  12/27/2019   Procedure: FIDUCIAL MARKER PLACEMENT;  Surgeon: Garner Nash, DO;  Location: [place] [facility];  Service: Pulmonary;;  . HOT HEMOSTASIS N/A 04/05/2020   Procedure: HOT HEMOSTASIS (ARGON PLASMA COAGULATION/BICAP);  Surgeon: Carol Ada, MD;  Location: [facility];  Service: Endoscopy;  Laterality: N/A;  . [place]   for cancer  . POLYPECTOMY  04/05/2020   Procedure: POLYPECTOMY;  Surgeon: Carol Ada, MD;  Location: [facility];  Service: Endoscopy;;  . TOOTH EXTRACTION  05/27/2013   tooth extraction with bone graft- several -for Implants  . VIDEO BRONCHOSCOPY WITH ENDOBRONCHIAL NAVIGATION N/A 12/27/2019   Procedure: VIDEO BRONCHOSCOPY;  Surgeon: Garner Nash, DO;  Location: [place];  Service: Pulmonary;  Laterality: N/A;    REVIEW OF SYSTEMS:  A comprehensive review of systems was negative except for: Constitutional: positive for fatigue Musculoskeletal: positive for back pain   PHYSICAL EXAMINATION: General appearance: alert, cooperative and no distress Head: Normocephalic, without obvious abnormality, atraumatic Neck: no adenopathy, no JVD, supple, symmetrical, trachea midline and thyroid not enlarged, symmetric, no tenderness/mass/nodules Lymph nodes: Cervical, supraclavicular, and axillary nodes normal. Resp: clear to auscultation bilaterally Back: symmetric, no curvature. ROM normal. No CVA tenderness. Cardio: regular rate and rhythm, S1, S2 normal, no murmur, click, rub or gallop GI: soft, non-tender; bowel sounds normal; no masses,  no organomegaly Extremities: extremities normal, atraumatic, no cyanosis or edema  ECOG PERFORMANCE STATUS: 1 - Symptomatic but completely ambulatory  Blood pressure (!) 102/52, pulse 67, temperature (!) 97.3 F (36.3  C), temperature source Tympanic, resp. rate 18, height 6' (1.829 m), weight 198 lb 1.6 oz (89.9 kg), SpO2 96 %.  LABORATORY DATA: Lab Results  Component Value Date   WBC 4.7 01/21/2021   HGB 10.0 (L) 01/21/2021   HCT 32.7 (L) 01/21/2021   MCV 84.7 01/21/2021   PLT 178 01/21/2021      Chemistry      Component Value Date/Time   NA 143 01/21/2021 1025   NA 145 (H) 10/04/2020 1559   K 3.6 01/21/2021 1025   CL 108 01/21/2021 1025   CO2 28 01/21/2021 1025   BUN 8 01/21/2021 1025   BUN 8 10/04/2020 1559   CREATININE  0.80 01/21/2021 1025   CREATININE 1.41 (H) 09/14/2019 0943      Component Value Date/Time   CALCIUM 8.7 (L) 01/21/2021 1025   ALKPHOS 91 01/21/2021 1025   AST 18 01/21/2021 1025   ALT 9 01/21/2021 1025   BILITOT 0.6 01/21/2021 1025       RADIOGRAPHIC STUDIES: CT Chest W Contrast  Result Date: 01/22/2021 CLINICAL DATA:  Small-cell lung cancer.  Restaging. EXAM: CT CHEST WITH CONTRAST TECHNIQUE: Multidetector CT imaging of the chest was performed during intravenous contrast administration. CONTRAST:  4mL OMNIPAQUE IOHEXOL 300 MG/ML  SOLN COMPARISON:  11/20/2020 FINDINGS: Cardiovascular: The heart size is normal. No substantial pericardial effusion. Coronary artery calcification is evident. Status post CABG. Atherosclerotic calcification is noted in the wall of the thoracic aorta. Mediastinum/Nodes: No mediastinal lymphadenopathy. There is no hilar lymphadenopathy. The esophagus has normal imaging features. There is no axillary lymphadenopathy. Lungs/Pleura: Stable scarring in the posterior right upper lobe, in the region of the fiducial markers, consistent with scarring from radiation therapy. Right pleural effusion has decreased in the interval, now tiny in character. The small left pleural effusion has also decreased in the interval. There is some dependent atelectasis in the lower lungs bilaterally. No new suspicious pulmonary nodule or mass. Tiny peripheral right middle  lobe nodule on 83/7 is unchanged. Upper Abdomen: Trace fluid is noted adjacent to the liver. Numerous calcified stones identified in the gallbladder lumen. Stable thickening of the left adrenal gland without a discrete nodule or mass. Tiny nonobstructing stone noted upper pole left kidney with probable interpolar left renal cyst, incompletely visualized in similar to PET-CT of 11/28/2019. Mild prominence of the main pancreatic duct in the tail of the pancreas is stable since prior PET-CT. Musculoskeletal: No worrisome lytic or sclerotic osseous abnormality. IMPRESSION: 1. Tiny bilateral pleural effusions, decreased in the interval. 2. Stable scarring in the posterior right upper lobe in the region of the fiducial markers, consistent with radiation therapy. 3. No new or progressive findings to suggest recurrent or metastatic disease in the chest. 4. Cholelithiasis. 5. Aortic Atherosclerosis (ICD10-I70.0). Electronically Signed   By: Misty Stanley M.D.   On: 01/22/2021 07:23    ASSESSMENT AND PLAN: This is a very pleasant 75 years old white male with limited stage, stage Ia small cell lung cancer presented with right upper lobe pulmonary nodule diagnosed in April 2021. The patient underwent a course of systemic chemotherapy with carboplatin and etoposide in addition to SBRT to the right upper lobe lung nodule.  Status post 4 cycles. This was followed by prophylactic cranial irradiation. The patient is currently on observation and he is feeling fine today with no concerning complaints. Repeat CT scan of the chest showed no concerning findings for disease recurrence or metastasis. I recommended for the patient to continue on observation with repeat CT scan of the chest in 6 months. For the anemia was advised to continue on over-the-counter iron supplements and also increase his iron rich diet. The patient was advised to call immediately if he has any other concerning symptoms in the interval. The patient  voices understanding of current disease status and treatment options and is in agreement with the current care plan.  All questions were answered. The patient knows to call the clinic with any problems, questions or concerns. We can certainly see the patient much sooner if necessary.  Disclaimer: This note was dictated with voice recognition software. Similar sounding words can inadvertently be transcribed and may not be corrected upon review.

## 2021-01-24 ENCOUNTER — Other Ambulatory Visit: Payer: Self-pay

## 2021-01-24 MED ORDER — ALBUTEROL SULFATE HFA 108 (90 BASE) MCG/ACT IN AERS: 2.0000 | INHALATION_SPRAY | Freq: Four times a day (QID) | RESPIRATORY_TRACT | 2 refills | Status: AC | PRN

## 2021-01-25 ENCOUNTER — Telehealth: Payer: Self-pay | Admitting: Internal Medicine

## 2021-01-25 NOTE — Telephone Encounter (Signed)
Scheduled per los. Called and left msg. Mailed printout  °

## 2021-02-12 ENCOUNTER — Other Ambulatory Visit (HOSPITAL_COMMUNITY): Payer: Self-pay | Admitting: Nurse Practitioner

## 2021-04-06 ENCOUNTER — Other Ambulatory Visit: Payer: Self-pay | Admitting: Family Medicine

## 2021-04-10 ENCOUNTER — Other Ambulatory Visit: Payer: Self-pay

## 2021-04-11 ENCOUNTER — Telehealth: Payer: Self-pay | Admitting: Family Medicine

## 2021-04-11 NOTE — Progress Notes (Signed)
  Care Management   Follow Up Note   04/11/2021 Name: [name] MRN: [id number] DOB: 08-09-1946   Referred by: Luetta Nutting, DO Reason for referral : No chief complaint on file.   An unsuccessful telephone outreach was attempted today. The patient was referred to the case management team for assistance with care management and care coordination.   Follow Up Plan: The care management team will reach out to the patient again over the next 7 days.   Noelle Penner [employer] Scheduler

## 2021-04-15 ENCOUNTER — Telehealth: Payer: Self-pay | Admitting: Lab

## 2021-04-15 NOTE — Chronic Care Management (AMB) (Signed)
  Chronic Care Management   Note  04/15/2021 Name: [name] MRN: [id number] DOB: 1945-11-04  [name] is a 75 y.o. year old male who is a primary care patient of Luetta Nutting, DO. I reached out to [employer] by phone today in response to a referral sent by [name]'s PCP, Luetta Nutting, DO.   [name] was given information about Chronic Care Management services today including:  CCM service includes personalized support from designated clinical staff supervised by his physician, including individualized plan of care and coordination with other care providers 24/7 contact phone numbers for assistance for urgent and routine care needs. Service will only be billed when office clinical staff spend 20 minutes or more in a month to coordinate care. Only one practitioner may furnish and bill the service in a calendar month. The patient may stop CCM services at any time (effective at the end of the month) by phone call to the office staff.   Patient agreed to services and verbal consent obtained.   Follow up plan:   [place]

## 2021-04-22 IMAGING — CT CT CHEST W/ CM
2 of 4 series · 15 of 36 positions shown, 18 images · IV contrast (omnipaque)
Comparison: 04/18/2020, 12/19/2019

CLINICAL DATA: Small-cell lung cancer restaging

EXAM:
CT CHEST WITH CONTRAST
TECHNIQUE: Multidetector CT imaging of the chest was performed during
intravenous contrast administration.
CONTRAST:  75mL OMNIPAQUE IOHEXOL 300 MG/ML  SOLN

[Series 2: axial st · axial · 0.91mm/px · z∈[-324,-12]mm · 12 of 186 slices shown, 15 images]
[im 15/186  mediastinal]
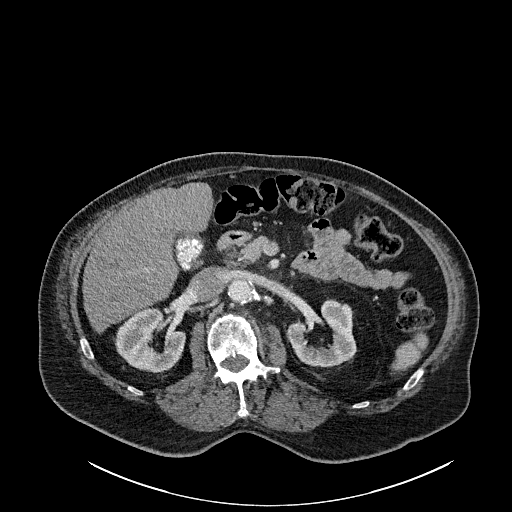
[im 15/186  lung]
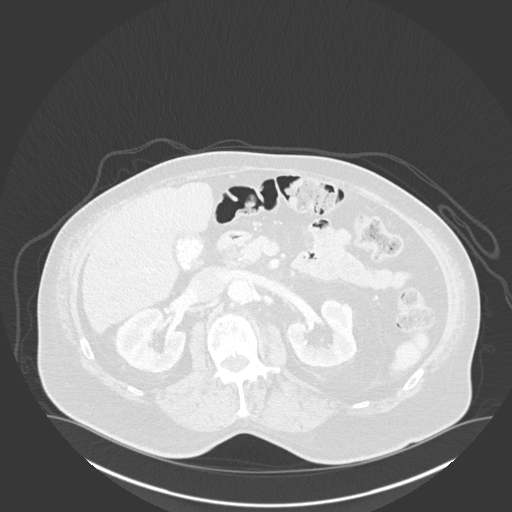
[im 29/186  lung]
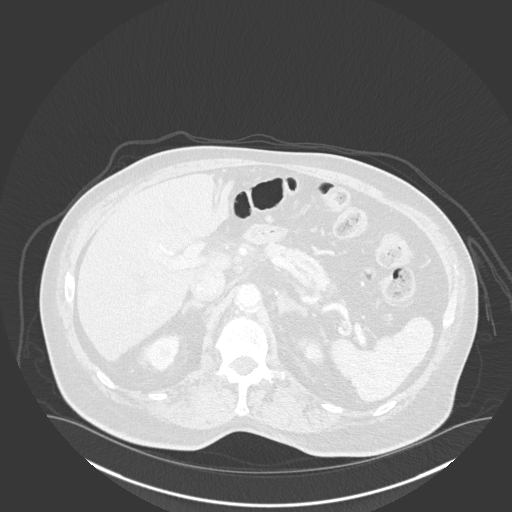
[im 43/186  lung]
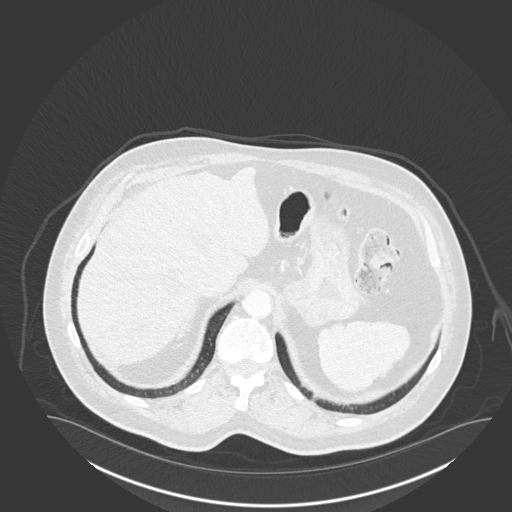
[im 57/186  lung]
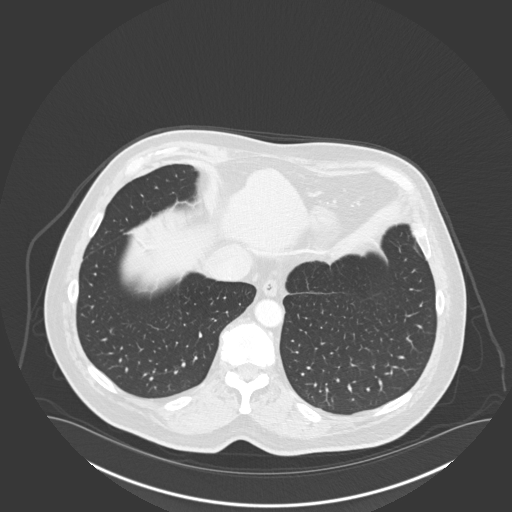
[im 72/186  mediastinal]
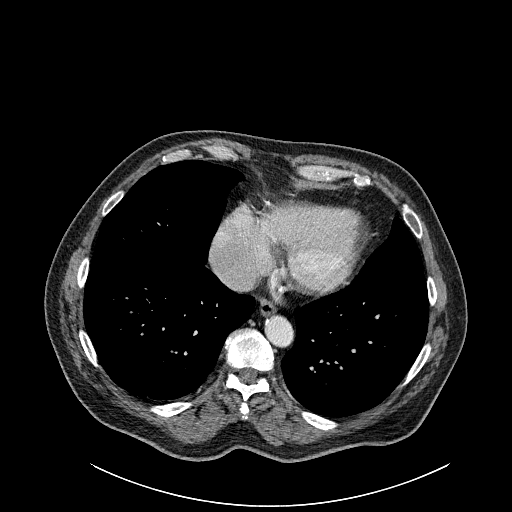
[im 72/186  lung]
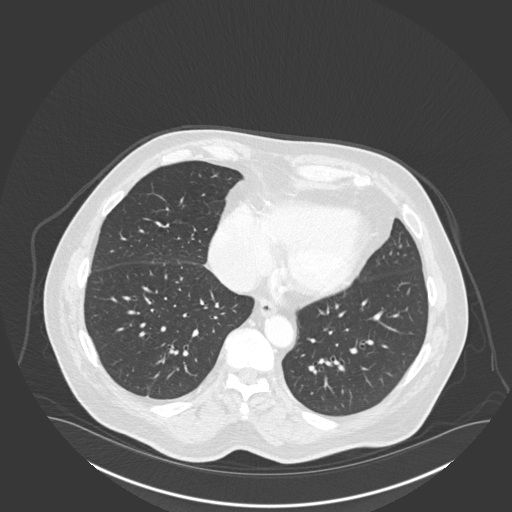
[im 86/186  lung]
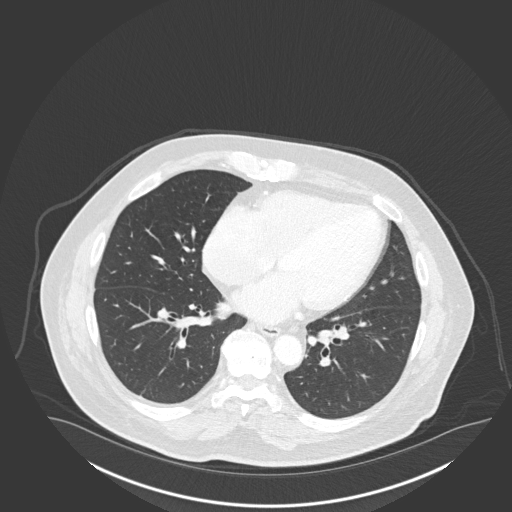
[im 100/186  lung]
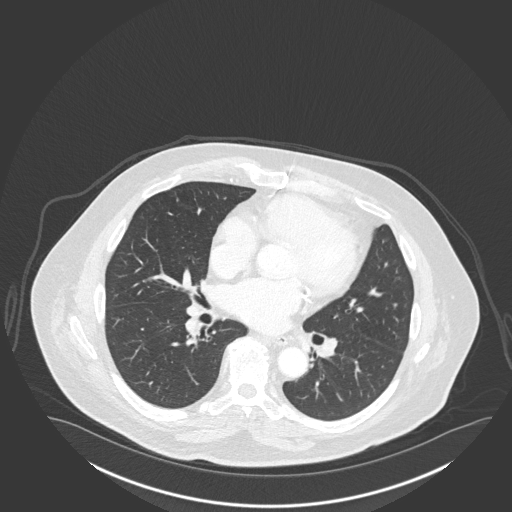
[im 114/186  lung]
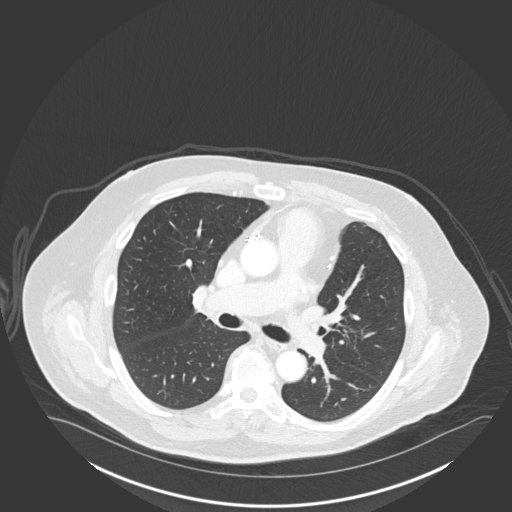
[im 129/186  mediastinal]
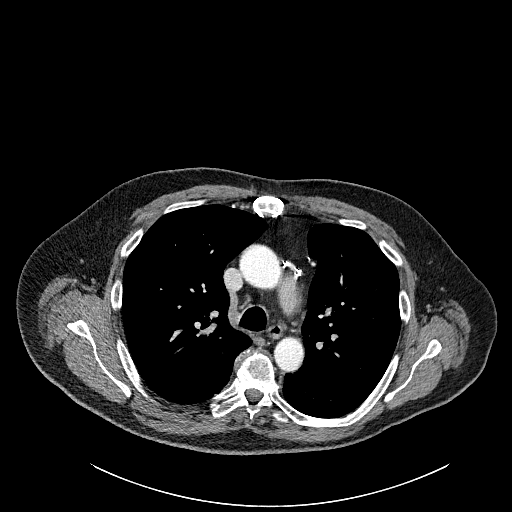
[im 129/186  lung]
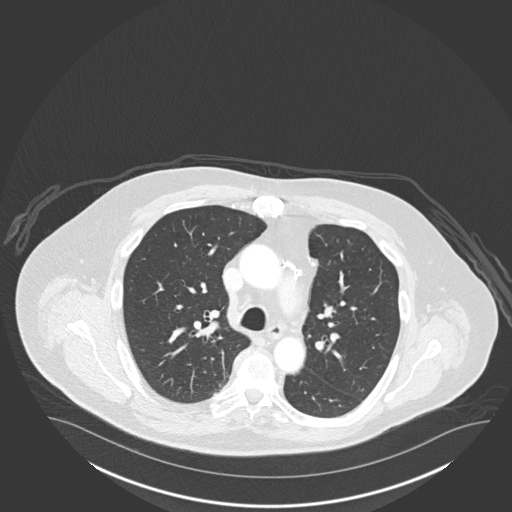
[im 143/186  lung]
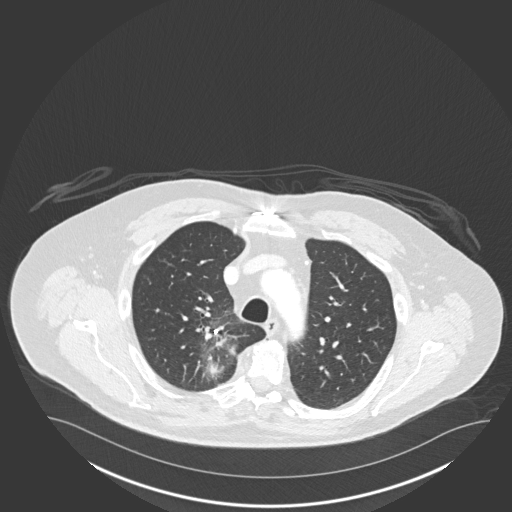
[im 157/186  lung]
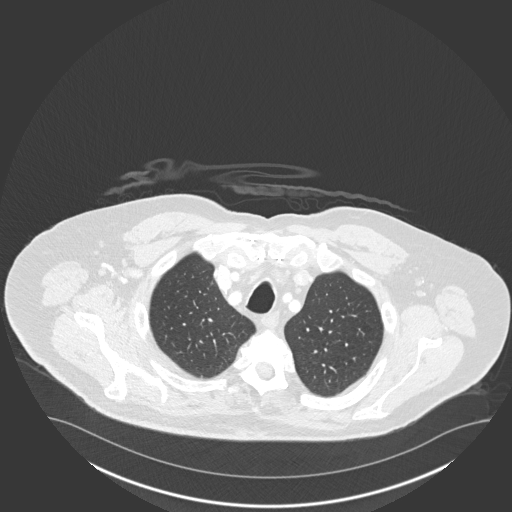
[im 171/186  lung]
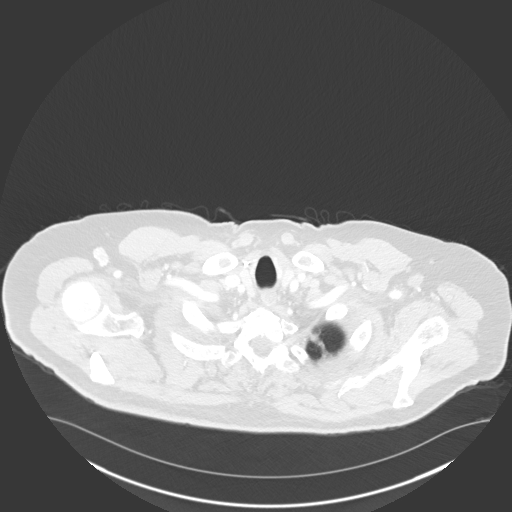

[Series 5: coronal · coronal · 0.75mm/px · 3 of 147 slices shown]
[im 30/147  lung]
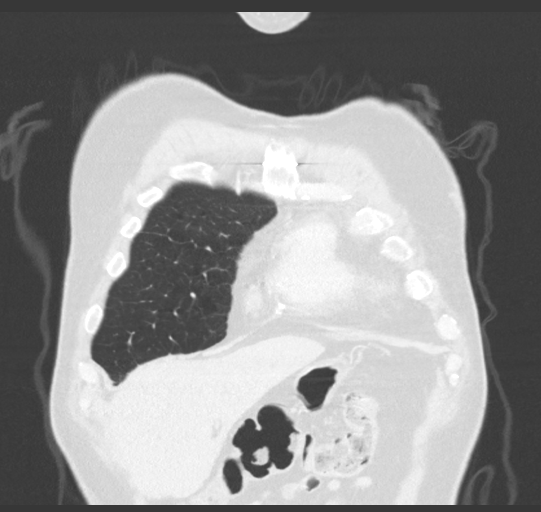
[im 59/147  lung]
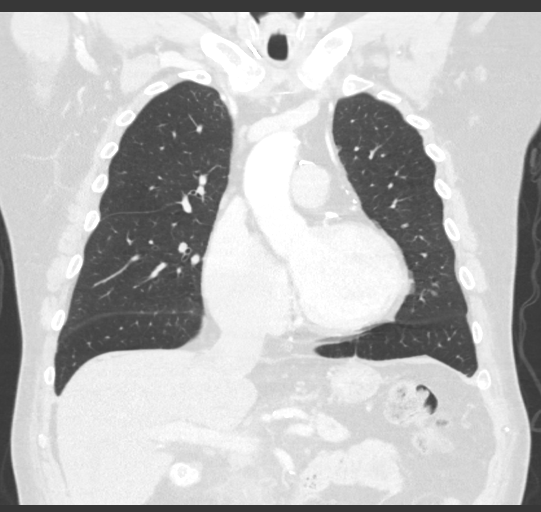
[im 88/147  lung]
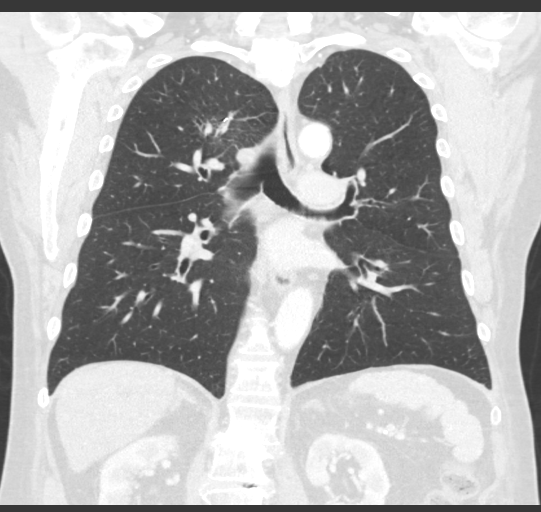

[15 of 36 positions shown; findings below may reference images not displayed]

FINDINGS: Cardiovascular: Aortic atherosclerosis. Normal heart size. Extensive
3 vessel coronary artery calcifications and/or stents. No
pericardial effusion.

Mediastinum/Nodes: No enlarged mediastinal, hilar, or axillary lymph
nodes. Thyroid gland, trachea, and esophagus demonstrate no
significant findings.

Lungs/Pleura: Interval development of heterogeneous and ground-glass
airspace opacity in the medial posterior right pulmonary apex
adjacent to a biopsy marking clip. Previously noted pulmonary nodule
is difficult to discretely appreciate within this airspace opacity
but is not significantly changed, measuring approximately 5 mm
(series 7, image 44). Mild centrilobular emphysema and diffuse
bilateral bronchial wall thickening. No pleural effusion or
pneumothorax.

Upper Abdomen: No acute abnormality. Numerous gallstones in the
gallbladder.

Musculoskeletal: No chest wall mass or suspicious bone lesions
identified.
IMPRESSION: 1. Interval development of heterogeneous and ground-glass airspace
opacity in the medial posterior right pulmonary apex adjacent to a
biopsy marking clip, consistent with developing radiation fibrosis.
2. Previously noted pulmonary nodule is difficult to discretely
appreciate within this airspace opacity but is not significantly
changed, measuring approximately 5 mm, consistent with sustained
treatment response.
3. Mild centrilobular emphysema and diffuse bilateral bronchial wall
thickening.
4. Coronary artery disease.
5. Cholelithiasis.

Aortic Atherosclerosis (SZAA6-D98.8) and Emphysema (SZAA6-NFJ.P).

## 2021-04-27 ENCOUNTER — Other Ambulatory Visit: Payer: Self-pay | Admitting: Family Medicine

## 2021-05-07 ENCOUNTER — Other Ambulatory Visit: Payer: Self-pay | Admitting: Cardiovascular Disease

## 2021-05-15 ENCOUNTER — Telehealth: Payer: Self-pay

## 2021-05-15 NOTE — Progress Notes (Signed)
Chronic Care Management Pharmacy Assistant   Name: [name]  MRN: [id number] DOB: 1946-02-10  [name] is an 75 y.o. year old male who presents for his initial CCM visit with the clinical pharmacist.  Recent office visits:  None noted   Recent consult visits:  01/23/21 [name] MD - Oncology - Seen for Malignant neoplasm - Labs ordered - No medication changes - Follow up in 26 weeks  11/22/20 [name] MD - Oncology - Seen for Malignant neoplasm - Labs ordered - No medication changes - Follow up in 2 months   Hospital visits:  None in previous 6 months  Medications: Outpatient Encounter Medications as of 05/15/2021  Medication Sig   sertraline (ZOLOFT) 50 MG tablet Take 1 tablet (50 mg total) by mouth daily.   acetaminophen (TYLENOL) 500 MG tablet Take 1,000 mg by mouth every 6 (six) hours as needed for mild pain or moderate pain.   albuterol (VENTOLIN HFA) 108 (90 Base) MCG/ACT inhaler Inhale 2 puffs into the lungs every 6 (six) hours as needed.   atorvastatin (LIPITOR) 10 MG tablet TAKE 1 TABLET(10 MG) BY MOUTH DAILY   dronedarone (MULTAQ) 400 MG tablet Take 1 tablet (400 mg total) by mouth 2 (two) times daily with a meal. appt required for refills [phone]   gabapentin (NEURONTIN) 300 MG capsule TAKE 2 CAPSULES(600 MG) BY MOUTH AT BEDTIME (Patient taking differently: Take 600 mg by mouth every evening.)   ketotifen (ZADITOR) 0.025 % ophthalmic solution Place 1 drop into both eyes 2 (two) times daily as needed (allergies).   metoprolol succinate (TOPROL-XL) 100 MG 24 hr tablet TAKE 1 TABLET(100 MG) BY MOUTH DAILY   Multiple Vitamin (MULTIVITAMIN WITH MINERALS) TABS tablet Take 1 tablet by mouth daily. Centrum silver   niacin (NIASPAN) 1000 MG CR tablet TAKE 1 TABLET(1000 MG) BY MOUTH AT BEDTIME (Patient taking differently: Take 1,000 mg by mouth at bedtime.)   nicotine polacrilex (COMMIT) 2 MG lozenge Take 2 mg by mouth daily as needed for smoking  cessation.    nitroGLYCERIN (NITROSTAT) 0.4 MG SL tablet Place 1 tablet (0.4 mg total) under the tongue every 5 (five) minutes as needed. (Patient taking differently: Place 0.4 mg under the tongue every 5 (five) minutes as needed for chest pain.)   NON FORMULARY CPAP at night   ondansetron (ZOFRAN) 8 MG tablet Take 1 tablet (8 mg total) by mouth every 8 (eight) hours as needed for nausea or vomiting.   oxymetazoline (AFRIN) 0.05 % nasal spray Place 1 spray into both nostrils daily as needed for congestion.    pantoprazole (PROTONIX) 40 MG tablet Take 1 tablet (40 mg total) by mouth daily.   prochlorperazine (COMPAZINE) 10 MG tablet TAKE 1 TABLET(10 MG) BY MOUTH EVERY 6 HOURS AS NEEDED FOR NAUSEA OR VOMITING   traMADol (ULTRAM) 50 MG tablet TAKE 1 TABLET(50 MG) BY MOUTH EVERY 12 HOURS AS NEEDED   traZODone (DESYREL) 50 MG tablet TAKE 1/2 TO 1 TABLET BY MOUTH AT BEDTIME AS NEEDED FOR SLEEP   XARELTO 20 MG TABS tablet TAKE 1 TABLET BY MOUTH EVERY DAY WITH SUPPER   Facility-Administered Encounter Medications as of 05/15/2021  Medication   sodium chloride flush (NS) 0.9 % injection 3 mL    Care Gaps: URINE MICROALBUMIN Zoster Vaccines- Shingrix  OPHTHALMOLOGY EXAM  FOOT EXAM  COVID-19 Vaccine  HEMOGLOBIN A1C  INFLUENZA VACCINE   sertraline (ZOLOFT) 50 MG tablet - Last fill date 12/17/20 90 DS  acetaminophen (  TYLENOL) 500 MG tablet - Last fill date 07/06/17 DS unknown  albuterol (VENTOLIN HFA) 108 (90 Base) MCG/ACT inhaler (Expired) - Last fill date 01/24/21 DS unknown  atorvastatin (LIPITOR) 10 MG tablet - Last fill date 05/08/21 90 DS dronedarone (MULTAQ) 400 MG tablet - Last fill date 02/14/21 90 DS gabapentin (NEURONTIN) 300 MG capsule - Last fill date 02/13/21 90 DS  ketotifen (ZADITOR) 0.025 % ophthalmic solution - Last fill date 12/15/19 DS unknown  metoprolol succinate (TOPROL-XL) 100 MG 24 hr tablet - Last fill date 04/28/21 90 DS Multiple Vitamin (MULTIVITAMIN WITH MINERALS) TABS  tablet - Last fill date 07/06/17 DS unknown  niacin (NIASPAN) 1000 MG CR tablet - Last fill date 03/12/21 90 DS nicotine polacrilex (COMMIT) 2 MG lozenge - Last fill date 01/05/20 DS unknown  nitroGLYCERIN (NITROSTAT) 0.4 MG SL tablet - Last fill date 03/21/20 25 tablets  ondansetron (ZOFRAN) 8 MG tablet - Last fill date 06/18/20 30 DS oxymetazoline (AFRIN) 0.05 % nasal spray - Last fill date 04/03/20 DS unknown  pantoprazole (PROTONIX) 40 MG tablet (Expired) - Last fill date 04/06/20 30 DS prochlorperazine (COMPAZINE) 10 MG tablet - Last fill date 06/18/20 30 DS  traMADol (ULTRAM) 50 MG tablet - Last fill date 04/10/21 15 DS traZODone (DESYREL) 50 MG tablet - Last fill date 04/29/21 90 DS XARELTO 20 MG TABS tablet  - Last fill date 03/12/21 90 DS    Star Rating Drugs: atorvastatin (LIPITOR) 10 MG tablet - Last fill date 05/08/21 90 DS   Andee Poles, CMA

## 2021-05-19 ENCOUNTER — Other Ambulatory Visit: Payer: Self-pay | Admitting: Family Medicine

## 2021-05-19 ENCOUNTER — Other Ambulatory Visit (HOSPITAL_COMMUNITY): Payer: Self-pay | Admitting: Physician Assistant

## 2021-05-23 ENCOUNTER — Ambulatory Visit (INDEPENDENT_AMBULATORY_CARE_PROVIDER_SITE_OTHER): Payer: Medicare Other | Admitting: Pharmacist

## 2021-05-23 ENCOUNTER — Other Ambulatory Visit: Payer: Self-pay

## 2021-05-23 DIAGNOSIS — E785 Hyperlipidemia, unspecified: Secondary | ICD-10-CM

## 2021-05-23 DIAGNOSIS — E1159 Type 2 diabetes mellitus with other circulatory complications: Secondary | ICD-10-CM

## 2021-05-23 DIAGNOSIS — I152 Hypertension secondary to endocrine disorders: Secondary | ICD-10-CM

## 2021-05-23 DIAGNOSIS — N1831 Chronic kidney disease, stage 3a: Secondary | ICD-10-CM

## 2021-05-23 DIAGNOSIS — E1169 Type 2 diabetes mellitus with other specified complication: Secondary | ICD-10-CM

## 2021-05-23 DIAGNOSIS — I480 Paroxysmal atrial fibrillation: Secondary | ICD-10-CM

## 2021-05-23 NOTE — Progress Notes (Signed)
Chronic Care Management Pharmacy Note  05/23/2021 Name:  [name] MRN:  [id number] DOB:  1946-08-06  Summary: addressed DM, HTN, HLD, Afib  Recommendations/Changes made from today's visit: none  Plan: f/u with pharmacist in 1 yr  Subjective: [name] is an 75 y.o. year old male who is a primary patient of Luetta Nutting, DO.  The CCM team was consulted for assistance with disease management and care coordination needs.    Engaged with patient by telephone for initial visit in response to provider referral for pharmacy case management and/or care coordination services.   Consent to Services:  The patient was given information about Chronic Care Management services, agreed to services, and gave verbal consent prior to initiation of services.  Please see initial visit note for detailed documentation.   Patient Care Team: Luetta Nutting, DO as PCP - General (Family Medicine) Levin Erp, OD as Referring Physician (Optometry) Darius Bump, [facility] as Pharmacist (Pharmacist)  Recent office visits:  None noted    Recent consult visits:  01/23/21 [name] MD - Oncology - Seen for Malignant neoplasm - Labs ordered - No medication changes - Follow up in 26 weeks  11/22/20 [name] MD - Oncology - Seen for Malignant neoplasm - Labs ordered - No medication changes - Follow up in 2 months     Hospital visits:  None in previous 6 months  Objective:  Lab Results  Component Value Date   CREATININE 0.80 01/21/2021   CREATININE 0.96 11/20/2020   CREATININE 0.98 10/04/2020    Lab Results  Component Value Date   HGBA1C 6.1 (H) 08/20/2020   Last diabetic Eye exam:  Lab Results  Component Value Date/Time   HMDIABEYEEXA No Retinopathy 09/07/2017 12:00 AM    Last diabetic Foot exam: No results found for: HMDIABFOOTEX      Component Value Date/Time   CHOL 97 08/20/2020 1021   TRIG 77 08/20/2020 1021   HDL 53 08/20/2020 1021   CHOLHDL 1.8 08/20/2020  1021   VLDL 14 03/18/2016 0822   LDLCALC 28 08/20/2020 1021   LDLDIRECT 48 01/25/2019 1049    Hepatic Function Latest Ref Rng & Units 01/21/2021 11/20/2020 08/20/2020  Total Protein 6.5 - 8.1 g/dL 5.2(L) 5.2(L) 6.5  Albumin 3.5 - 5.0 g/dL 2.7(L) 2.7(L) 3.5  AST 15 - 41 U/L _0 ALT 0 - 44 U/L _1 Alk Phosphatase 38 - 126 U/L 91 92 64  Total Bilirubin 0.3 - 1.2 mg/dL 0.6 0.5 0.8  Bilirubin, Direct <=0.2 mg/dL - - -    Lab Results  Component Value Date/Time   TSH 1.185 06/30/2017 04:00 PM   TSH 1.164 09/14/2015 11:03 AM   TSH 1.440 06/13/2013 08:14 AM    CBC Latest Ref Rng & Units 01/21/2021 11/20/2020 10/04/2020  WBC 4.0 - 10.5 K/uL 4.7 4.9 4.9  Hemoglobin 13.0 - 17.0 g/dL 10.0(L) 9.8(L) 9.0(L)  Hematocrit 39.0 - 52.0 % 32.7(L) 33.5(L) 32.3(L)  Platelets 150 - 400 K/uL 178 183 199    Lab Results  Component Value Date/Time   VD25OH 30 09/14/2015 11:03 AM     Social History   Tobacco Use  Smoking Status Former   Packs/day: 1.50   Years: 30.00   Pack years: 45.00   Types: Cigarettes, Cigars   Quit date: 09/01/1994   Years since quitting: 26.7  Smokeless Tobacco Never  Tobacco Comments   cigars quit  - 11/16/2019   BP Readings from Last  3 Encounters:  01/23/21 (!) 102/52  11/22/20 96/60  10/23/20 124/62   Pulse Readings from Last 3 Encounters:  01/23/21 67  11/22/20 86  10/23/20 66   Wt Readings from Last 3 Encounters:  01/23/21 198 lb 1.6 oz (89.9 kg)  11/22/20 206 lb 6.4 oz (93.6 kg)  10/23/20 206 lb (93.4 kg)    Assessment: Review of patient past medical history, allergies, medications, health status, including review of consultants reports, laboratory and other test data, was performed as part of comprehensive evaluation and provision of chronic care management services.   SDOH:  (Social Determinants of Health) assessments and interventions performed:    CCM Care Plan  No Known Allergies  Medications Reviewed Today     Reviewed by Mohamed,  Mohamed, MD (Physician) on 01/23/21 at 1119  Med List Status: <None>   Medication Order Taking? Sig Documenting Provider Last Dose Status Informant  acetaminophen (TYLENOL) 500 MG tablet [id number] No Take 1,000 mg by mouth every 6 (six) hours as needed for mild pain or moderate pain. [provider] Taking Active Self  atorvastatin (LIPITOR) 10 MG tablet [id number] No Take 1 tablet (10 mg total) by mouth daily. Berry, Jonathan J, MD Taking Active Self  Patient taking differently:  Discontinued 01/23/21 1113 (Error)   dronedarone (MULTAQ) 400 MG tablet [id number] No Take 400 mg by mouth 2 (two) times daily. [provider] Taking Active Self  Discontinued 01/23/21 1113 (Error) gabapentin (NEURONTIN) 300 MG capsule [id number] No TAKE 2 CAPSULES(600 MG) BY MOUTH AT BEDTIME  Patient taking differently: Take 600 mg by mouth every evening.   Matthews, Cody, DO Taking Active   ketotifen (ZADITOR) 0.025 % ophthalmic solution [id number] No Place 1 drop into both eyes 2 (two) times daily as needed (allergies). [provider] Taking Active Self  metoprolol succinate (TOPROL-XL) 100 MG 24 hr tablet [id number]  TAKE 1 TABLET(100 MG) BY MOUTH DAILY Berry, Jonathan J, MD  Active   Multiple Vitamin (MULTIVITAMIN WITH MINERALS) TABS tablet [id number] No Take 1 tablet by mouth daily. Centrum silver [provider] Taking Active Self  niacin (NIASPAN) 1000 MG CR tablet [id number] No TAKE 1 TABLET(1000 MG) BY MOUTH AT BEDTIME  Patient taking differently: Take 1,000 mg by mouth at bedtime.   Kelly, Thomas A, MD Taking Active   nicotine polacrilex (COMMIT) 2 MG lozenge [id number] No Take 2 mg by mouth daily as needed for smoking cessation.  [provider] Taking Active Self  nitroGLYCERIN (NITROSTAT) 0.4 MG SL tablet [id number] No Place 1 tablet (0.4 mg total) under the tongue every 5 (five) minutes as needed.  Patient taking differently: Place 0.4 mg under the tongue every 5 (five)  minutes as needed for chest pain.   Berry, Jonathan J, MD Taking Active   NON FORMULARY [id number] No CPAP at night [provider] Taking Active Self  ondansetron (ZOFRAN) 8 MG tablet [id number] No Take 1 tablet (8 mg total) by mouth every 8 (eight) hours as needed for nausea or vomiting. Perkins, Alison Claire, PA-C Taking Active Self  oxymetazoline (AFRIN) 0.05 % nasal spray [id number] No Place 1 spray into both nostrils daily as needed for congestion.  [provider] Taking Active Self  pantoprazole (PROTONIX) 40 MG tablet [id number] No Take 1 tablet (40 mg total) by mouth daily. Shahmehdi, Seyed A, MD Taking Expired 08/08/20 2359 Self  prochlorperazine (COMPAZINE) 10 MG tablet [id number] No TAKE 1 TABLET(10 MG) BY MOUTH EVERY 6 HOURS AS NEEDED FOR NAUSEA OR VOMITING Perkins,   Rex Kras, PA-C Taking Active Self  sertraline (ZOLOFT) 50 MG tablet [id number]  Take 1 tablet (50 mg total) by mouth daily. Luetta Nutting, DO  Active   sodium chloride flush (NS) 0.9 % injection 3 mL [id number]   Lorretta Harp, MD  Active   traMADol (ULTRAM) 50 MG tablet [id number] No TAKE 1 TABLET(50 MG) BY MOUTH EVERY 12 HOURS AS NEEDED  Patient taking differently: Take 50 mg by mouth every 12 (twelve) hours as needed for moderate pain. TAKE 1 TABLET(50 MG) BY MOUTH EVERY 12 HOURS AS NEEDED   Luetta Nutting, DO Taking Active   traZODone (DESYREL) 50 MG tablet [id number] No TAKE 1/2 TO 1 TABLET BY MOUTH AT BEDTIME AS NEEDED FOR SLEEP  Patient taking differently: Take 50 mg by mouth at bedtime.   Luetta Nutting, DO Taking Active   VENTOLIN HFA 108 (90 Base) MCG/ACT inhaler [id number] No INHALE 2 PUFFS INTO THE LUNGS EVERY 6 HOURS AS NEEDED FOR WHEEZING OR SHORTNESS OF BREATH  Patient taking differently: Inhale 2 puffs into the lungs every 6 (six) hours as needed.   Gregor Hams, MD Taking Active   XARELTO 20 MG TABS tablet [id number]  TAKE 1 TABLET BY MOUTH EVERY DAY WITH SUPPER Lorretta Harp, MD   Active   Med List Note Ishmael Holter, Laurell Roof, CPhT 03/14/15 0700): Uses CPAP machine at bedtime            Patient Active Problem List   Diagnosis Date Noted   MDD (major depressive disorder) 09/27/2020   History of COVID-19 09/27/2020   Suspected COVID-19 virus infection 09/11/2020   Bright red rectal bleeding 04/03/2020   Anemia associated with chemotherapy 03/20/2020   Chemotherapy induced neutropenia ([place]) 01/24/2020   Malignant neoplasm of right upper lobe of lung ([place]) 01/03/2020   Encounter for antineoplastic chemotherapy 01/03/2020   Goals of care, counseling/discussion 01/03/2020   Lung nodule 12/27/2019   Secondary hypercoagulable state ([place]) 12/14/2019   Ischemic cardiomyopathy 07/13/2019   Type 2 diabetes mellitus with diabetic chronic kidney disease ([place]) 06/03/2017   Chronic back pain 06/03/2017   Left renal artery stenosis ([place]) 06/20/2016   Anemia, iron deficiency 10/17/2015   Hypertension associated with diabetes ([place]) 09/14/2015   Benign hypertension with coincident congestive heart failure ([place]) 09/14/2015   Pulmonary hypertension ([place]) 09/14/2015   Insomnia 09/14/2015   RLS (restless legs syndrome) 09/14/2015   Persistent atrial fibrillation ([place])    Coronary artery disease 05/27/2013   Status post coronary artery bypass grafting x 7, 11/16/1996 05/27/2013   Peripheral artery disease ([place]) 05/27/2013   Hyperlipidemia associated with type 2 diabetes mellitus ([place]) 05/27/2013   Paroxysmal atrial fibrillation ([place]) 11/17/2012   Long term current use of anticoagulant therapy 11/17/2012   Atypical atrial flutter ([place]) 05/24/2011   Bundle branch block, right 05/24/2011   OSA and COPD overlap syndrome ([place]) 05/24/2011    Immunization History  Administered Date(s) Administered   Fluad Quad(high Dose 65+) 05/05/2019   Influenza,inj,Quad PF,6+ Mos 06/09/2018   Influenza-Unspecified 07/03/2011, 07/03/2014, 06/02/2015, 05/30/2016   PFIZER(Purple Top)SARS-COV-2  Vaccination 10/15/2019, 11/07/2019   Pneumococcal Conjugate-13 07/17/2011, 10/17/2015   Pneumococcal Polysaccharide-23 12/09/2000, 09/15/2018   Pneumococcal-Unspecified 07/17/2011   Tdap 07/03/2011   Varicella 09/16/2015   Zoster, Live 09/21/2015    Conditions to be addressed/monitored: Atrial Fibrillation, HTN, HLD, and DMII  There are no care plans that you recently modified to display for this patient.   Medication Assistance: None required.  Patient affirms current coverage meets needs.  Patient's preferred pharmacy is:  [facility] - [place], [place] - [address] [place] [address] Phone: [phone] Fax: [phone]  [id number] - [place], [place] - [address] [place] [address] Phone: [phone] Fax: [phone]  Uses pill box? Yes Pt endorses 100% compliance  Follow Up:  Patient agrees to Care Plan and Follow-up.  Plan: Telephone follow up appointment with care management team member scheduled for:  1 year   J       

## 2021-05-24 NOTE — Patient Instructions (Signed)
Visit Information   PATIENT GOALS:   Goals Addressed             This Visit's Progress    Medication Management       Patient Goals/Self-Care Activities Over the next 365 days, patient will:  take medications as prescribed  Follow Up Plan: Telephone follow up appointment with care management team member scheduled for:  1 year        Consent to CCM Services: [name] was given information about Chronic Care Management services including:  CCM service includes personalized support from designated clinical staff supervised by his physician, including individualized plan of care and coordination with other care providers 24/7 contact phone numbers for assistance for urgent and routine care needs. Service will only be billed when office clinical staff spend 20 minutes or more in a month to coordinate care. Only one practitioner may furnish and bill the service in a calendar month. The patient may stop CCM services at any time (effective at the end of the month) by phone call to the office staff. The patient will be responsible for cost sharing (co-pay) of up to 20% of the service fee (after annual deductible is met).  Patient agreed to services and verbal consent obtained.   Patient verbalizes understanding of instructions provided today and agrees to view in [place].   Telephone follow up appointment with care management team member scheduled for: 1 year  CLINICAL CARE PLAN: Patient Care Plan: Medication Management     Problem Identified: DM, HTN, HLD, Afib      Long-Range Goal: Disease Progression Prevention   Start Date: 05/24/2021  This Visit's Progress: On track  Priority: High  Note:   Current Barriers:  None at present  Pharmacist Clinical Goal(s):  Over the next 365 days, patient will maintain control of chronic conditions as evidenced by medication fill history, lab values, and vital signs  through collaboration with PharmD and provider.    Interventions: 1:1 collaboration with Luetta Nutting, DO regarding development and update of comprehensive plan of care as evidenced by provider attestation and co-signature Inter-disciplinary care team collaboration (see longitudinal plan of care) Comprehensive medication review performed; medication list updated in electronic medical record  Diabetes:  Controlled; current treatment:lifestyle modifications only; a1c 6.1  Current glucose readings: checks BG if not feeling well but states runs 110-115, "very normal"   Denies hypoglycemic/hyperglycemic symptoms  Current meal patterns: breakfast: toast, sausage/egg/cheese biscuit, eggs; lunch: sometimes skips; dinner: sandwich, or takeout but "something light"; snacks: almonds; drinks: water, 1 cup of coffee AM   Current exercise: limited by endurance, used to golf  Counseled on A1c & BG values Recommended continue current regimen,  Hypertension:  Controlled; current treatment:metoprol XL 125m daily, dronedarone 4069mBID;   Current home readings:   Denies hypotensive/hypertensive symptoms  Recommended continue current,  Hyperlipidemia:  Controlled; current treatment:atorvastatin 1052maily, niacin 1000m59m daily; LDL 28  Recommended continue current regimen Atrial Fibrillation:  Controlled; current rate/rhythm control: dronedarone 400mg55m, metoprolol XL 100mg 102my; anticoagulant treatment: xarelto 20mg d41m  Recommended continue current regimen  Patient Goals/Self-Care Activities Over the next 365 days, patient will:  take medications as prescribed  Follow Up Plan: Telephone follow up appointment with care management team member scheduled for:  1 year

## 2021-05-31 DIAGNOSIS — E785 Hyperlipidemia, unspecified: Secondary | ICD-10-CM | POA: Diagnosis not present

## 2021-05-31 DIAGNOSIS — I480 Paroxysmal atrial fibrillation: Secondary | ICD-10-CM | POA: Diagnosis not present

## 2021-05-31 DIAGNOSIS — N1831 Chronic kidney disease, stage 3a: Secondary | ICD-10-CM

## 2021-05-31 DIAGNOSIS — I152 Hypertension secondary to endocrine disorders: Secondary | ICD-10-CM

## 2021-05-31 DIAGNOSIS — E1159 Type 2 diabetes mellitus with other circulatory complications: Secondary | ICD-10-CM

## 2021-05-31 DIAGNOSIS — E1169 Type 2 diabetes mellitus with other specified complication: Secondary | ICD-10-CM

## 2021-05-31 DIAGNOSIS — E1122 Type 2 diabetes mellitus with diabetic chronic kidney disease: Secondary | ICD-10-CM

## 2021-06-09 ENCOUNTER — Other Ambulatory Visit: Payer: Self-pay | Admitting: Cardiovascular Disease

## 2021-06-09 ENCOUNTER — Other Ambulatory Visit: Payer: Self-pay | Admitting: Family Medicine

## 2021-06-09 DIAGNOSIS — I480 Paroxysmal atrial fibrillation: Secondary | ICD-10-CM

## 2021-06-25 ENCOUNTER — Encounter: Payer: Self-pay | Admitting: Family Medicine

## 2021-06-26 NOTE — Telephone Encounter (Signed)
Patient called and scheduled for a follow up appointment.

## 2021-06-27 ENCOUNTER — Other Ambulatory Visit: Payer: Self-pay

## 2021-06-27 ENCOUNTER — Ambulatory Visit (INDEPENDENT_AMBULATORY_CARE_PROVIDER_SITE_OTHER): Payer: Medicare Other | Admitting: Family Medicine

## 2021-06-27 ENCOUNTER — Encounter: Payer: Self-pay | Admitting: Family Medicine

## 2021-06-27 VITALS — BP 100/61 | HR 62 | Temp 98.3°F | Ht 72.0 in | Wt 203.0 lb

## 2021-06-27 DIAGNOSIS — N1831 Chronic kidney disease, stage 3a: Secondary | ICD-10-CM | POA: Diagnosis not present

## 2021-06-27 DIAGNOSIS — I480 Paroxysmal atrial fibrillation: Secondary | ICD-10-CM | POA: Diagnosis not present

## 2021-06-27 DIAGNOSIS — Z23 Encounter for immunization: Secondary | ICD-10-CM | POA: Diagnosis not present

## 2021-06-27 DIAGNOSIS — G4700 Insomnia, unspecified: Secondary | ICD-10-CM

## 2021-06-27 DIAGNOSIS — E1122 Type 2 diabetes mellitus with diabetic chronic kidney disease: Secondary | ICD-10-CM | POA: Diagnosis not present

## 2021-06-27 DIAGNOSIS — F331 Major depressive disorder, recurrent, moderate: Secondary | ICD-10-CM

## 2021-06-27 LAB — POCT GLYCOSYLATED HEMOGLOBIN (HGB A1C): Hemoglobin A1C: 5.3 % (ref 4.0–5.6)

## 2021-06-27 NOTE — Assessment & Plan Note (Signed)
He is doing well with trazodone as needed.  We will continue.

## 2021-06-27 NOTE — Progress Notes (Signed)
[name] - 75 y.o. male MRN [id number]  Date of birth: 1946/04/22  Subjective Chief Complaint  Patient presents with   Diabetes    HPI [name] is a 75 year old male here today for follow-up.  Reports overall he is feeling well.  He has upcoming scan next month for follow-up of his lung cancer.  He does continue to see cardiology as well for management of A. fib.  He has not had anginal symptoms or increased symptoms related to his atrial fibrillation.  He continues on Multaq and metoprolol.  He is anticoagulated with Xarelto.  Trazodone continues to work well for his insomnia.  Depression and anxiety have been well managed with sertraline.  He has been able to manage his diabetes without medication.  ROS:  A comprehensive ROS was completed and negative except as noted per HPI  No Known Allergies  Past Medical History:  Diagnosis Date   Atypical atrial flutter (HCC)    Cancer (HCC)    Testicular- surgery    COPD (chronic obstructive pulmonary disease) ([place])    Coronary artery disease    Diabetes mellitus without complication (HCC)    Diastolic dysfunction, left ventricle 05/2018   Dr. Rayann Heman- Cardiologist   Dysrhythmia    chronic AFib   History of kidney stones    passed   HLD (hyperlipidemia)    HTN (hypertension)    Myocardial infarction ([place])    x 2 maybe 1 more   OSA (obstructive sleep apnea)    uses CPAP   Peripheral artery disease ([place])    pseudoaneurysm post afib ablation at [facility] 2011, s/p bilateral iliac stents   Persistent atrial fibrillation (HCC)    Pre-diabetes    Renal artery stenosis ([place])    right renal artery PTA and stenting   S/P coronary artery bypass graft x 7 11/16/96    Past Surgical History:  Procedure Laterality Date   2-D echocardiogram  03/25/2010   Normal left ventricular function. Mild MR, TR, trivial AR   ABDOMINAL AORTOGRAM W/LOWER EXTREMITY Bilateral 10/08/2020   Procedure: ABDOMINAL AORTOGRAM W/LOWER EXTREMITY;  Surgeon: Lorretta Harp, MD;  Location: [facility];  Service: Cardiovascular;  Laterality: Bilateral;   ATRIAL FIBRILLATION ABLATION  03/27/2010, 12/31/2010   [facility], Dr. Edwin Cap SURGERY  2004, 2007   Laminectomy, Discedectomy x 2   BRONCHIAL BIOPSY  12/27/2019   Procedure: BRONCHIAL BIOPSIES;  Surgeon: Garner Nash, DO;  Location: [facility];  Service: Pulmonary;;   BRONCHIAL BRUSHINGS  12/27/2019   Procedure: BRONCHIAL BRUSHINGS;  Surgeon: Garner Nash, DO;  Location: [facility];  Service: Pulmonary;;   BRONCHIAL NEEDLE ASPIRATION BIOPSY  12/27/2019   Procedure: BRONCHIAL NEEDLE ASPIRATION BIOPSIES;  Surgeon: Garner Nash, DO;  Location: [facility];  Service: Pulmonary;;   cardiac stress test  11/21/2009   Exercise capacity 5 METS. No significant ischemia demonstrated   CARDIOVERSION N/A 03/15/2015   Procedure: CARDIOVERSION;  Surgeon: Lorretta Harp, MD;  Location: [facility];  Service: Cardiovascular;  Laterality: N/A;   CARDIOVERSION N/A 07/10/2017   Procedure: CARDIOVERSION;  Surgeon: Pixie Casino, MD;  Location: [facility];  Service: Cardiovascular;  Laterality: N/A;   CARDIOVERSION N/A 03/23/2018   Procedure: CARDIOVERSION;  Surgeon: Sanda Klein, MD;  Location: [facility];  Service: Cardiovascular;  Laterality: N/A;   CARDIOVERSION N/A 05/01/2020   Procedure: CARDIOVERSION;  Surgeon: Josue Hector, MD;  Location: [facility];  Service: Cardiovascular;  Laterality: N/A;   COLONOSCOPY WITH PROPOFOL  N/A 04/05/2020   Procedure: COLONOSCOPY WITH PROPOFOL;  Surgeon: Carol Ada, MD;  Location: [facility];  Service: Endoscopy;  Laterality: N/A;   CORONARY ARTERY BYPASS GRAFT  1998   ELECTROMAGNETIC NAVIGATION BROCHOSCOPY  12/27/2019   Procedure: NAVIGATION BRONCHOSCOPY;  Surgeon: Garner Nash, DO;  Location: [facility];  Service: Pulmonary;;   ESOPHAGOGASTRODUODENOSCOPY (EGD) WITH PROPOFOL N/A 04/05/2020   Procedure: ESOPHAGOGASTRODUODENOSCOPY (EGD) WITH  PROPOFOL;  Surgeon: Carol Ada, MD;  Location: [facility];  Service: Endoscopy;  Laterality: N/A;   FIDUCIAL MARKER PLACEMENT  12/27/2019   Procedure: FIDUCIAL MARKER PLACEMENT;  Surgeon: Garner Nash, DO;  Location: [facility];  Service: Pulmonary;;   HOT HEMOSTASIS N/A 04/05/2020   Procedure: HOT HEMOSTASIS (ARGON PLASMA COAGULATION/BICAP);  Surgeon: Carol Ada, MD;  Location: [facility];  Service: Endoscopy;  Laterality: N/A;   [place]   for cancer   POLYPECTOMY  04/05/2020   Procedure: POLYPECTOMY;  Surgeon: Carol Ada, MD;  Location: [facility];  Service: Endoscopy;;   TOOTH EXTRACTION  05/27/2013   tooth extraction with bone graft- several -for Implants   VIDEO BRONCHOSCOPY WITH ENDOBRONCHIAL NAVIGATION N/A 12/27/2019   Procedure: VIDEO BRONCHOSCOPY;  Surgeon: Garner Nash, DO;  Location: [facility];  Service: Pulmonary;  Laterality: N/A;    Social History   Socioeconomic History   Marital status: Divorced    Spouse name: Not on file   Number of children: 2   Years of education: 16   Highest education level: Associate degree: academic program  Occupational History   Occupation: retired    Comment: Community education officer of [employer]  Tobacco Use   Smoking status: Former    Packs/day: 1.50    Years: 30.00    Pack years: 45.00    Types: Cigarettes, Cigars    Quit date: 09/01/1994    Years since quitting: 26.8   Smokeless tobacco: Never   Tobacco comments:    cigars quit  - 11/16/2019  Vaping Use   Vaping Use: Never used  Substance and Sexual Activity   Alcohol use: Not Currently   Drug use: No   Sexual activity: Yes  Other Topics Concern   Not on file  Social History Narrative   Pt lives in [place] alone.  Divorced. 2 cups of coffee in the morning.   Retired from [employer])   Social Determinants of Health   Financial Resource Strain: Not on file  Food Insecurity: Not on file  Transportation Needs: Not on file  Physical  Activity: Not on file  Stress: Not on file  Social Connections: Not on file    Family History  Problem Relation Age of Onset   Cancer Mother    Cancer Father    Other Brother        NO MEDICAL PROBLEMS   Other Son        NO MEDICAL PROBLEMS   Leukemia Daughter 6       Recover and has no other problems    Health Maintenance  Topic Date Due   URINE MICROALBUMIN  Never done   Zoster Vaccines- Shingrix (1 of 2) Never done   OPHTHALMOLOGY EXAM  09/07/2018   FOOT EXAM  06/10/2019   COVID-19 Vaccine (3 - Pfizer risk series) 12/05/2019   TETANUS/TDAP  07/02/2021   HEMOGLOBIN A1C  12/26/2021   COLONOSCOPY (Pts 45-66yrs Insurance coverage will need to be confirmed)  04/05/2030   Pneumonia Vaccine 42+ Years old  Completed   INFLUENZA VACCINE  Completed   Hepatitis C Screening  Completed   HPV VACCINES  Aged Out     ----------------------------------------------------------------------------------------------------------------------------------------------------------------------------------------------------------------- Physical Exam BP 100/61 (BP Location: Left Arm, Patient Position: Sitting, Cuff Size: Normal)   Pulse 62   Temp 98.3 F (36.8 C) (Oral)   Ht 6' (1.829 m)   Wt 203 lb (92.1 kg)   SpO2 94%   BMI 27.53 kg/m   Physical Exam Constitutional:      Appearance: Normal appearance.  Eyes:     General: No scleral icterus. Cardiovascular:     Rate and Rhythm: Normal rate.  Pulmonary:     Effort: Pulmonary effort is normal.     Breath sounds: Normal breath sounds.  Musculoskeletal:     Cervical back: Neck supple.  Neurological:     General: No focal deficit present.     Mental Status: He is alert.  Psychiatric:        Mood and Affect: Mood normal.        Behavior: Behavior normal.     ------------------------------------------------------------------------------------------------------------------------------------------------------------------------------------------------------------------- Assessment and Plan  Paroxysmal atrial fibrillation (HCC) Stable at this time with current medications.  He will continue management per cardiology.  Tolerating Xarelto well for anticoagulation.  Type 2 diabetes mellitus with diabetic chronic kidney disease ([place]) Diabetes remains well controlled.  Continue management through dietary change.  Insomnia He is doing well with trazodone as needed.  We will continue.  MDD (major depressive disorder) He continues to do well with sertraline.  Continue at current strength.   No orders of the defined types were placed in this encounter.   Return in about 6 months (around 12/26/2021) for DM.    This visit occurred during the SARS-CoV-2 public health emergency.  Safety protocols were in place, including screening questions prior to the visit, additional usage of staff PPE, and extensive cleaning of exam room while observing appropriate contact time as indicated for disinfecting solutions.

## 2021-06-27 NOTE — Assessment & Plan Note (Signed)
He continues to do well with sertraline.  Continue at current strength.

## 2021-06-27 NOTE — Assessment & Plan Note (Signed)
Stable at this time with current medications.  He will continue management per cardiology.  Tolerating Xarelto well for anticoagulation.

## 2021-06-27 NOTE — Assessment & Plan Note (Signed)
Diabetes remains well controlled.  Continue management through dietary change.

## 2021-06-27 NOTE — Patient Instructions (Signed)
Great to see you today! Continue current medications.  See me again in 6 months or sooner if needed.

## 2021-07-01 ENCOUNTER — Other Ambulatory Visit (HOSPITAL_COMMUNITY): Payer: Self-pay | Admitting: Cardiovascular Disease

## 2021-07-19 ENCOUNTER — Other Ambulatory Visit: Payer: Self-pay

## 2021-07-19 ENCOUNTER — Ambulatory Visit (HOSPITAL_COMMUNITY)
Admission: RE | Admit: 2021-07-19 | Discharge: 2021-07-19 | Disposition: A | Discharge status: Home / Self Care | Payer: Medicare Other | Source: Ambulatory Visit | Attending: Internal Medicine | Admitting: Internal Medicine

## 2021-07-19 ENCOUNTER — Encounter (HOSPITAL_COMMUNITY): Payer: Self-pay

## 2021-07-19 DIAGNOSIS — R911 Solitary pulmonary nodule: Secondary | ICD-10-CM | POA: Diagnosis not present

## 2021-07-19 DIAGNOSIS — C3490 Malignant neoplasm of unspecified part of unspecified bronchus or lung: Secondary | ICD-10-CM | POA: Diagnosis not present

## 2021-07-19 DIAGNOSIS — J90 Pleural effusion, not elsewhere classified: Secondary | ICD-10-CM | POA: Diagnosis not present

## 2021-07-19 DIAGNOSIS — J9811 Atelectasis: Secondary | ICD-10-CM | POA: Diagnosis not present

## 2021-07-19 DIAGNOSIS — I700 Atherosclerosis of aorta: Secondary | ICD-10-CM | POA: Diagnosis not present

## 2021-07-19 LAB — POCT I-STAT CREATININE: Creatinine, Ser: 1 mg/dL (ref 0.61–1.24)

## 2021-07-19 MED ORDER — IOHEXOL 350 MG/ML SOLN
75.0000 mL | Freq: Once | INTRAVENOUS | Status: AC | PRN
  Administered 2021-07-19: 60 mL via INTRAVENOUS

## 2021-07-19 MED ORDER — SODIUM CHLORIDE (PF) 0.9 % IJ SOLN
INTRAMUSCULAR | Status: AC
  Filled 2021-07-19: qty 50

## 2021-07-22 ENCOUNTER — Inpatient Hospital Stay: Payer: Medicare Other

## 2021-07-23 IMAGING — CT CT CHEST W/ CM
2 of 4 series · 15 of 36 positions shown, 18 images · IV contrast (OMNIPAQUE)
Comparison: 08/20/2020.

CLINICAL DATA: Non-small-cell lung cancer.  Restaging.

EXAM:
CT CHEST WITH CONTRAST
TECHNIQUE: Multidetector CT imaging of the chest was performed during
intravenous contrast administration.
CONTRAST:  75mL OMNIPAQUE IOHEXOL 300 MG/ML  SOLN

[Series 2: axial st · axial · 0.90mm/px · z∈[+1280,+1568]mm · 12 of 170 slices shown, 15 images]
[im 13/170  mediastinal]
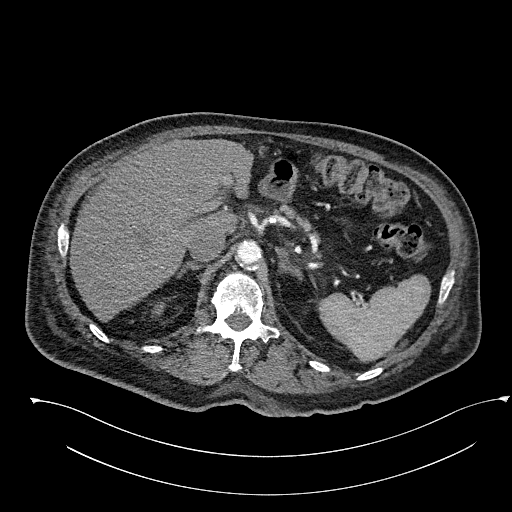
[im 13/170  lung]
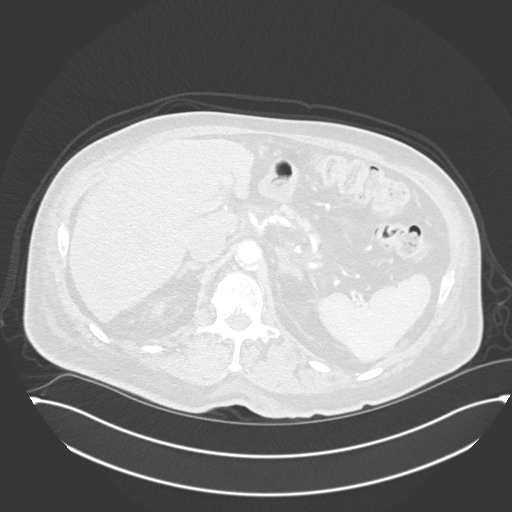
[im 25/170  lung]
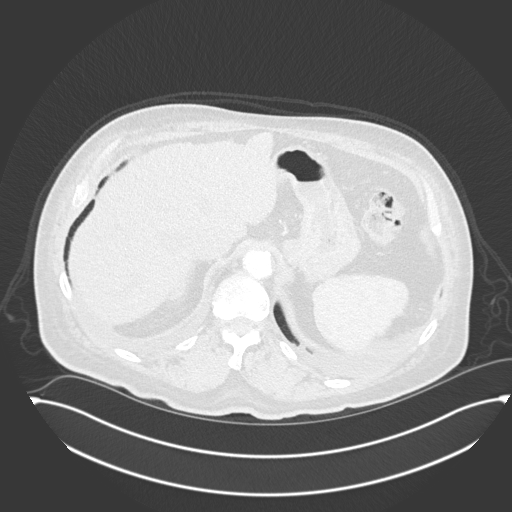
[im 37/170  lung]
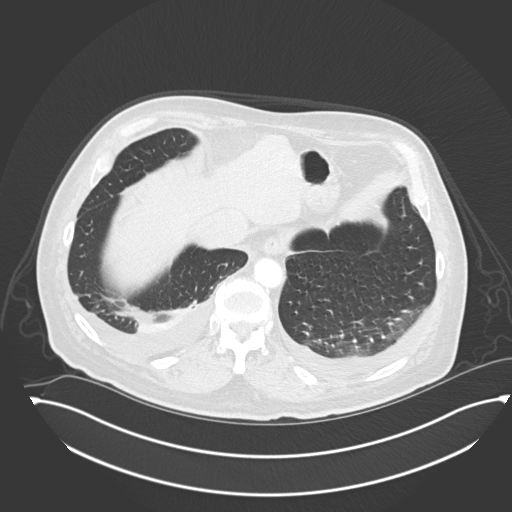
[im 49/170  lung]
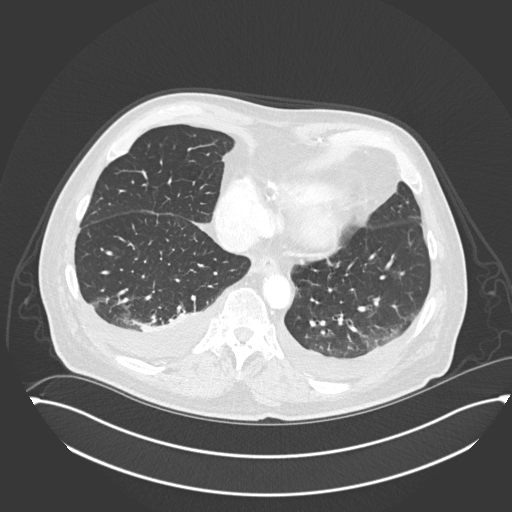
[im 61/170  mediastinal]
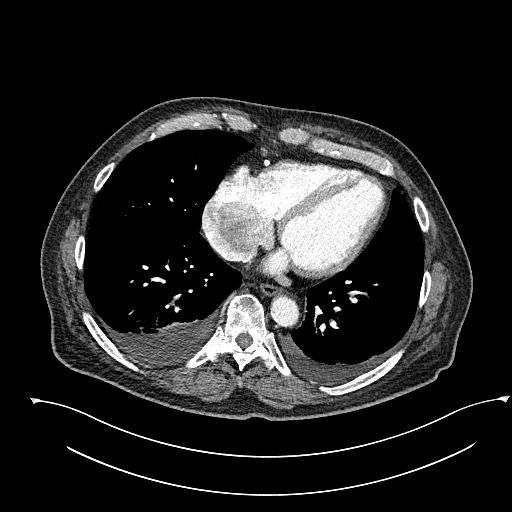
[im 61/170  lung]
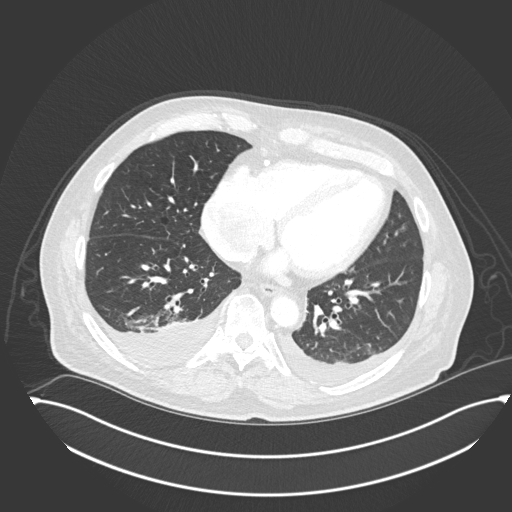
[im 73/170  lung]
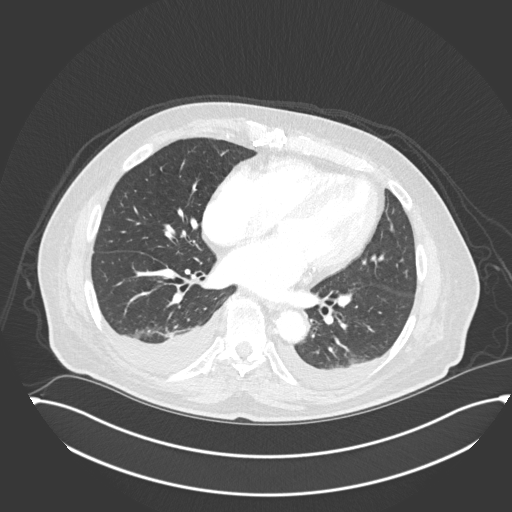
[im 97/170  lung]
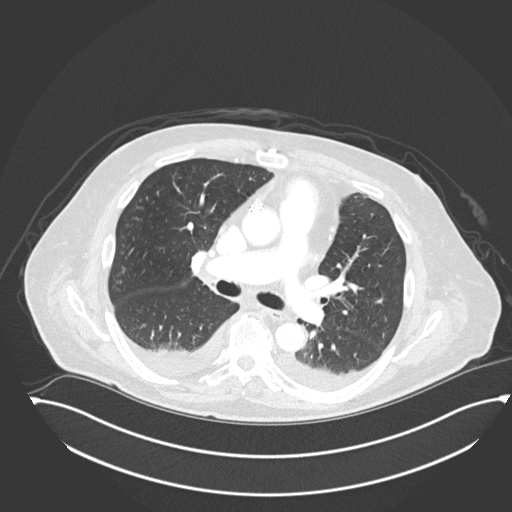
[im 109/170  lung]
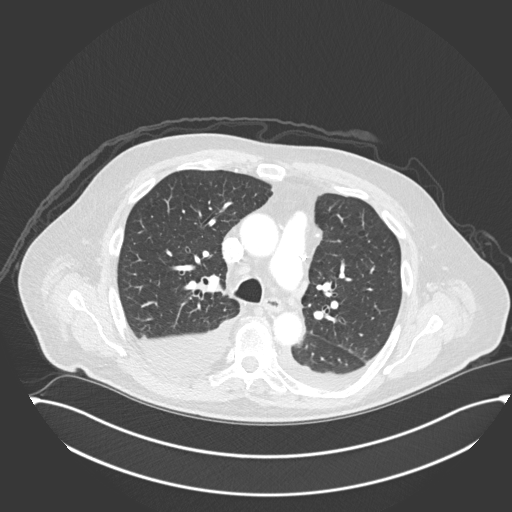
[im 121/170  mediastinal]
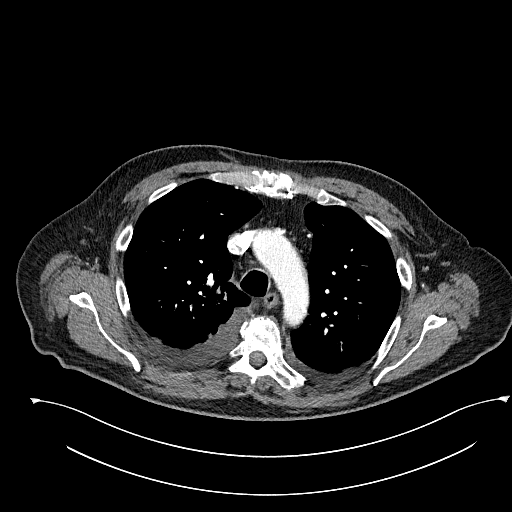
[im 121/170  lung]
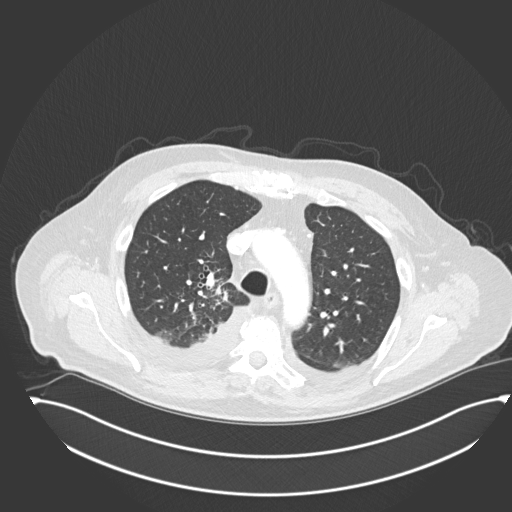
[im 133/170  lung]
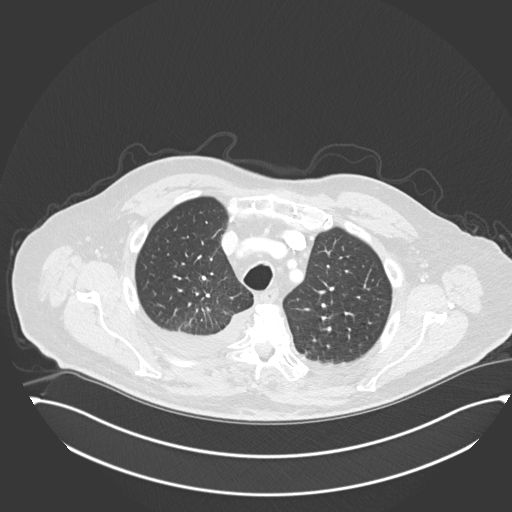
[im 145/170  lung]
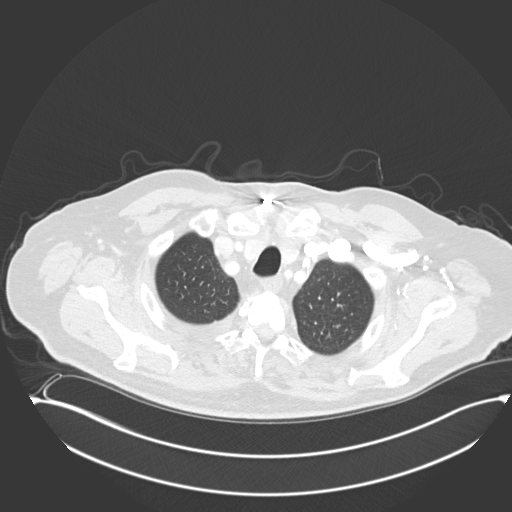
[im 157/170  lung]
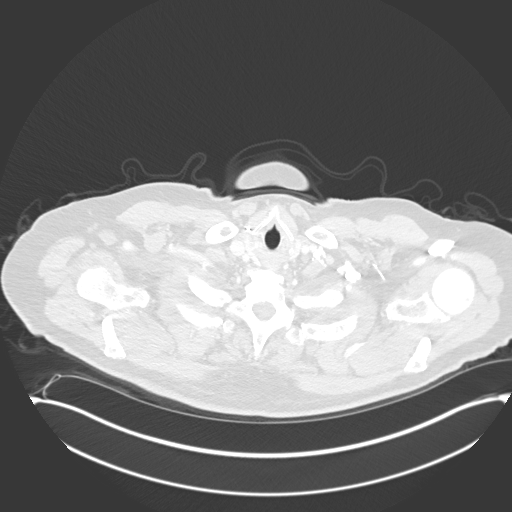

[Series 5: coronal · coronal · 0.70mm/px · 3 of 154 slices shown]
[im 31/154  lung]
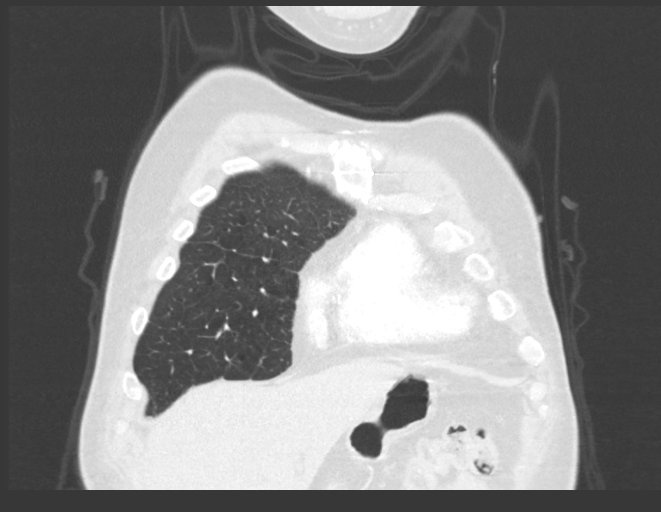
[im 62/154  lung]
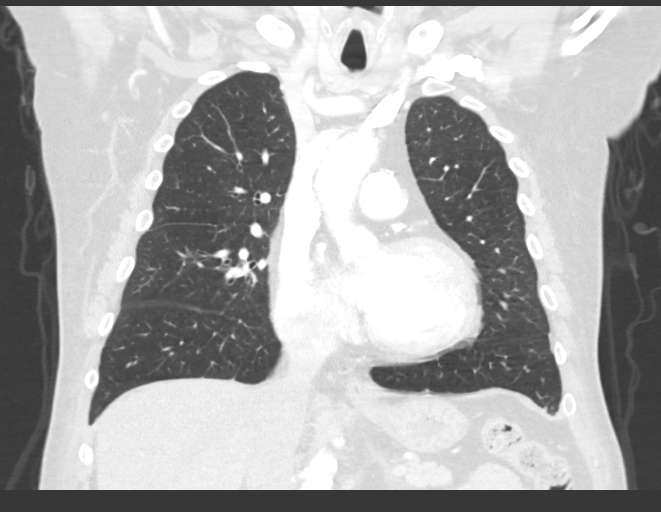
[im 92/154  lung]
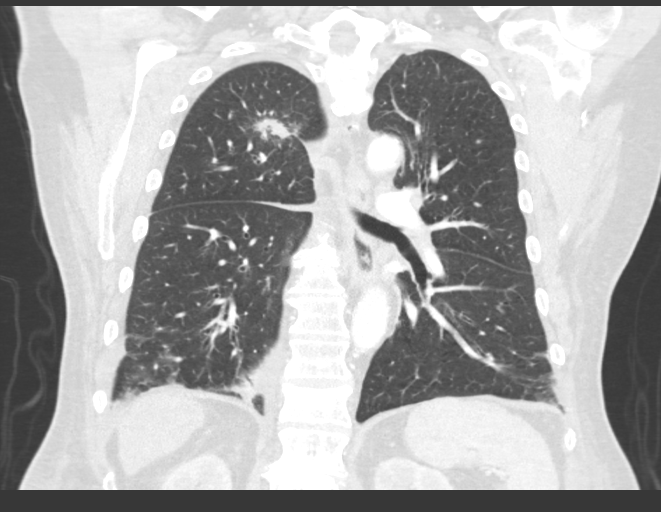

[15 of 36 positions shown; findings below may reference images not displayed]

FINDINGS: Cardiovascular: The heart size is normal. No substantial pericardial
effusion. Coronary artery calcification is evident. Atherosclerotic
calcification is noted in the wall of the thoracic aorta. Status
post CABG.

Mediastinum/Nodes: No mediastinal lymphadenopathy. There is no hilar
lymphadenopathy. The esophagus has normal imaging features. There is
no axillary lymphadenopathy.

Lungs/Pleura: Centrilobular emphsyema noted. Interval
evolution/progression of post radiation fibrosis in the medial
suprahilar right lung, in the region of the fiducial markers. Tiny
nodule measured previously has become incorporated into the of all
vein scar. Tiny subpleural nodule in the right middle lobe (93/7) is
stable in the interval. Interval development of atelectasis in the
dependent lung bases bilaterally with new small bilateral pleural
effusions, right slightly more than left.

Upper Abdomen: Nodular liver contour suggests cirrhosis although
liver has been incompletely visualized. Similar thickening of both
adrenal glands. Gallbladder not included on today's exam.

Musculoskeletal: No worrisome lytic or sclerotic osseous
abnormality. Mild superior endplate compression deformity at T12 is
stable.
IMPRESSION: 1. Interval evolution/progression of consolidative opacity in the
medial suprahilar right lung, presumably reflecting evolution of
post radiation scarring. These changes are in the region of the
fiducial markers and obscure the tiny nodule measured on the
previous study. Close attention on follow-up recommended to exclude
recurrence.
2. Interval development of dependent atelectasis in both lower lobes
with new small bilateral pleural effusions, right slightly more than
left.
3. Nodular liver contour suggests cirrhosis although liver has been
incompletely visualized.
4. Aortic Atherosclerosis (75E9R-QO5.5) and Emphysema ([id number]-UPB.S).

## 2021-07-24 ENCOUNTER — Inpatient Hospital Stay: Payer: Medicare Other | Attending: Internal Medicine | Admitting: Internal Medicine

## 2021-07-24 ENCOUNTER — Other Ambulatory Visit: Payer: Medicare Other

## 2021-07-24 ENCOUNTER — Other Ambulatory Visit: Payer: Self-pay

## 2021-07-24 ENCOUNTER — Inpatient Hospital Stay: Payer: Medicare Other

## 2021-07-24 VITALS — BP 108/54 | HR 61 | Temp 97.7°F | Resp 18 | Ht 72.0 in | Wt 209.3 lb

## 2021-07-24 DIAGNOSIS — C3411 Malignant neoplasm of upper lobe, right bronchus or lung: Secondary | ICD-10-CM | POA: Diagnosis not present

## 2021-07-24 DIAGNOSIS — J449 Chronic obstructive pulmonary disease, unspecified: Secondary | ICD-10-CM | POA: Insufficient documentation

## 2021-07-24 DIAGNOSIS — C3490 Malignant neoplasm of unspecified part of unspecified bronchus or lung: Secondary | ICD-10-CM

## 2021-07-24 DIAGNOSIS — E785 Hyperlipidemia, unspecified: Secondary | ICD-10-CM | POA: Insufficient documentation

## 2021-07-24 DIAGNOSIS — Z923 Personal history of irradiation: Secondary | ICD-10-CM | POA: Insufficient documentation

## 2021-07-24 DIAGNOSIS — I739 Peripheral vascular disease, unspecified: Secondary | ICD-10-CM | POA: Insufficient documentation

## 2021-07-24 DIAGNOSIS — Z79899 Other long term (current) drug therapy: Secondary | ICD-10-CM | POA: Diagnosis not present

## 2021-07-24 DIAGNOSIS — Z87442 Personal history of urinary calculi: Secondary | ICD-10-CM | POA: Diagnosis not present

## 2021-07-24 DIAGNOSIS — I4891 Unspecified atrial fibrillation: Secondary | ICD-10-CM | POA: Diagnosis not present

## 2021-07-24 DIAGNOSIS — G4730 Sleep apnea, unspecified: Secondary | ICD-10-CM | POA: Diagnosis not present

## 2021-07-24 DIAGNOSIS — I2510 Atherosclerotic heart disease of native coronary artery without angina pectoris: Secondary | ICD-10-CM | POA: Diagnosis not present

## 2021-07-24 DIAGNOSIS — Z7901 Long term (current) use of anticoagulants: Secondary | ICD-10-CM | POA: Diagnosis not present

## 2021-07-24 DIAGNOSIS — I10 Essential (primary) hypertension: Secondary | ICD-10-CM | POA: Insufficient documentation

## 2021-07-24 DIAGNOSIS — E119 Type 2 diabetes mellitus without complications: Secondary | ICD-10-CM | POA: Insufficient documentation

## 2021-07-24 LAB — CMP (CANCER CENTER ONLY)
ALT: 13 U/L (ref 0–44)
AST: 27 U/L (ref 15–41)
Albumin: 2.2 g/dL — ABNORMAL LOW (ref 3.5–5.0)
Alkaline Phosphatase: 128 U/L — ABNORMAL HIGH (ref 38–126)
Anion gap: 7 (ref 5–15)
BUN: 10 mg/dL (ref 8–23)
CO2: 24 mmol/L (ref 22–32)
Calcium: 7.9 mg/dL — ABNORMAL LOW (ref 8.9–10.3)
Chloride: 108 mmol/L (ref 98–111)
Creatinine: 0.84 mg/dL (ref 0.61–1.24)
GFR, Estimated: >60 mL/min (ref >60)
Glucose, Bld: 117 mg/dL — ABNORMAL HIGH (ref 70–99)
Potassium: 3.8 mmol/L (ref 3.5–5.1)
Sodium: 139 mmol/L (ref 135–145)
Total Bilirubin: 0.6 mg/dL (ref 0.3–1.2)
Total Protein: 4.4 g/dL — ABNORMAL LOW (ref 6.5–8.1)

## 2021-07-24 LAB — CBC WITH DIFFERENTIAL (CANCER CENTER ONLY)
Abs Immature Granulocytes: 0 10*3/uL (ref 0.00–0.07)
Basophils Absolute: 0 10*3/uL (ref 0.0–0.1)
Basophils Relative: 1 %
Eosinophils Absolute: 0.2 10*3/uL (ref 0.0–0.5)
Eosinophils Relative: 5 %
HCT: 33 % — ABNORMAL LOW (ref 39.0–52.0)
Hemoglobin: 9.8 g/dL — ABNORMAL LOW (ref 13.0–17.0)
Immature Granulocytes: 0 %
Lymphocytes Relative: 30 %
Lymphs Abs: 1.5 10*3/uL (ref 0.7–4.0)
MCH: 24.6 pg — ABNORMAL LOW (ref 26.0–34.0)
MCHC: 29.7 g/dL — ABNORMAL LOW (ref 30.0–36.0)
MCV: 82.9 fL (ref 80.0–100.0)
Monocytes Absolute: 0.5 10*3/uL (ref 0.1–1.0)
Monocytes Relative: 9 %
Neutro Abs: 2.7 10*3/uL (ref 1.7–7.7)
Neutrophils Relative %: 55 %
Platelet Count: 198 10*3/uL (ref 150–400)
RBC: 3.98 MIL/uL — ABNORMAL LOW (ref 4.22–5.81)
RDW: 17.5 % — ABNORMAL HIGH (ref 11.5–15.5)
WBC Count: 4.9 10*3/uL (ref 4.0–10.5)
nRBC: 0 % (ref 0.0–0.2)

## 2021-07-24 NOTE — Progress Notes (Signed)
[facility] [phone]   [phone]  OFFICE PROGRESS NOTE  Luetta Nutting, DO [place] [address]  DIAGNOSIS: Limited stage, stage IA (T1b, N0, M0) small cell lung cancer presented with right upper lobe pulmonary nodule that is hypermetabolic and biopsy proven to be consistent with small cell carcinoma diagnosed in April 2021.   PRIOR THERAPY:  1) Concurrent chemoradiation with carboplatin for an AUC of 5 on days 1 and etoposide 100 mg/m2 on days 1, 2, and 3 IV every 3 weeks.  First dose received on 01/10/2020.   Status post 4 cycles of treatment.  This was concurrent with radiotherapy to the right upper lobe pulmonary nodule with SBRT. 2) prophylactic cranial irradiation under the care of Dr. Lisbeth Renshaw completed May 28, 2020.   CURRENT THERAPY:  Observation.  INTERVAL HISTORY: [name] 75 y.o. male returns to the clinic today for 73-month follow-up visit accompanied by his wife.  The patient is feeling fine today with no concerning complaints except for mild fatigue and shortness of breath with exertion.  He denied having any current chest pain, cough or hemoptysis.  He has no nausea, vomiting, diarrhea or constipation.  He has no headache or visual changes.  He denied having any significant weight loss or night sweats.  He is here today for evaluation with repeat CT scan of the chest for restaging of his disease.   MEDICAL HISTORY: Past Medical History:  Diagnosis Date   Atypical atrial flutter ([place])    Cancer [facility])    Testicular- surgery    COPD (chronic obstructive pulmonary disease) ([place])    Coronary artery disease    Diabetes mellitus without complication (H[facility])    Diastolic dysfunction, left ventricle 05/2018   Dr. Rayann Heman- Cardiologist   Dysrhythmia    chronic AFib   History of kidney stones    passed   HLD (hyperlipidemia)    HTN (hypertension)    Myocardial infarction ([place])    x 2  maybe 1 more   OSA (obstructive sleep apnea)    uses CPAP   Peripheral artery disease ([place])    pseudoaneurysm post afib ablation at [facility] 2011, s/p bilateral iliac stents   Persistent atrial fibrillation (H[facility])    Pre-diabetes    Renal artery stenosis ([place])    right renal artery PTA and stenting   S/P coronary artery bypass graft x 7 11/16/96    ALLERGIES:  has No Known Allergies.  MEDICATIONS:  Current Outpatient Medications  Medication Sig Dispense Refill   acetaminophen (TYLENOL) 500 MG tablet Take 1,000 mg by mouth every 6 (six) hours as needed for mild pain or moderate pain.     albuterol (VENTOLIN HFA) 108 (90 Base) MCG/ACT inhaler Inhale 2 puffs into the lungs every 6 (six) hours as needed. 18 g 2   atorvastatin (LIPITOR) 10 MG tablet TAKE 1 TABLET(10 MG) BY MOUTH DAILY 90 tablet 1   dronedarone (MULTAQ) 400 MG tablet Take 1 tablet (400 mg total) by mouth 2 (two) times daily with a meal. OVERDUE FOR FOLLOW UP APPT 60 tablet 0   ferrous sulfate 324 MG TBEC Take 324 mg by mouth.     gabapentin (NEURONTIN) 300 MG capsule TAKE 2 CAPSULES(600 MG) BY MOUTH AT BEDTIME 180 capsule 2   ketotifen (ZADITOR) 0.025 % ophthalmic solution Place 1 drop into both eyes 2 (two) times daily as needed (allergies).     metoprolol succinate (  TOPROL-XL) 100 MG 24 hr tablet TAKE 1 TABLET(100 MG) BY MOUTH DAILY 90 tablet 3   Multiple Vitamin (MULTIVITAMIN WITH MINERALS) TABS tablet Take 1 tablet by mouth daily. Centrum silver     niacin (NIASPAN) 1000 MG CR tablet TAKE 1 TABLET(1000 MG) BY MOUTH AT BEDTIME 90 tablet 1   nicotine polacrilex (COMMIT) 2 MG lozenge Take 2 mg by mouth daily as needed for smoking cessation.      nitroGLYCERIN (NITROSTAT) 0.4 MG SL tablet Place 1 tablet (0.4 mg total) under the tongue every 5 (five) minutes as needed. (Patient taking differently: Place 0.4 mg under the tongue every 5 (five) minutes as needed for chest pain.) 25 tablet 3   NON FORMULARY CPAP at night      oxymetazoline (AFRIN) 0.05 % nasal spray Place 1 spray into both nostrils daily as needed for congestion.      prochlorperazine (COMPAZINE) 10 MG tablet TAKE 1 TABLET(10 MG) BY MOUTH EVERY 6 HOURS AS NEEDED FOR NAUSEA OR VOMITING 30 tablet 0   sertraline (ZOLOFT) 50 MG tablet TAKE 1 TABLET(50 MG) BY MOUTH DAILY 90 tablet 1   traMADol (ULTRAM) 50 MG tablet TAKE 1 TABLET(50 MG) BY MOUTH EVERY 12 HOURS AS NEEDED 30 tablet 1   traZODone (DESYREL) 50 MG tablet TAKE 1/2 TO 1 TABLET BY MOUTH AT BEDTIME AS NEEDED FOR SLEEP 90 tablet 3   XARELTO 20 MG TABS tablet TAKE 1 TABLET BY MOUTH EVERY DAY WITH SUPPER 90 tablet 1   Current Facility-Administered Medications  Medication Dose Route Frequency Provider Last Rate Last Admin   sodium chloride flush (NS) 0.9 % injection 3 mL  3 mL Intravenous Q12H Lorretta Harp, MD        SURGICAL HISTORY:  Past Surgical History:  Procedure Laterality Date   2-D echocardiogram  03/25/2010   Normal left ventricular function. Mild MR, TR, trivial AR   ABDOMINAL AORTOGRAM W/LOWER EXTREMITY Bilateral 10/08/2020   Procedure: ABDOMINAL AORTOGRAM W/LOWER EXTREMITY;  Surgeon: Lorretta Harp, MD;  Location: [facility];  Service: Cardiovascular;  Laterality: Bilateral;   ATRIAL FIBRILLATION ABLATION  03/27/2010, 12/31/2010   [facility], Dr. Edwin Cap SURGERY  2004, 2007   Laminectomy, Discedectomy x 2   BRONCHIAL BIOPSY  12/27/2019   Procedure: BRONCHIAL BIOPSIES;  Surgeon: Garner Nash, DO;  Location: [facility];  Service: Pulmonary;;   BRONCHIAL BRUSHINGS  12/27/2019   Procedure: BRONCHIAL BRUSHINGS;  Surgeon: Garner Nash, DO;  Location: [facility];  Service: Pulmonary;;   BRONCHIAL NEEDLE ASPIRATION BIOPSY  12/27/2019   Procedure: BRONCHIAL NEEDLE ASPIRATION BIOPSIES;  Surgeon: Garner Nash, DO;  Location: [facility];  Service: Pulmonary;;   cardiac stress test  11/21/2009   Exercise capacity 5 METS. No significant ischemia demonstrated    CARDIOVERSION N/A 03/15/2015   Procedure: CARDIOVERSION;  Surgeon: Lorretta Harp, MD;  Location: [facility];  Service: Cardiovascular;  Laterality: N/A;   CARDIOVERSION N/A 07/10/2017   Procedure: CARDIOVERSION;  Surgeon: Pixie Casino, MD;  Location: [facility];  Service: Cardiovascular;  Laterality: N/A;   CARDIOVERSION N/A 03/23/2018   Procedure: CARDIOVERSION;  Surgeon: Sanda Klein, MD;  Location: [facility];  Service: Cardiovascular;  Laterality: N/A;   CARDIOVERSION N/A 05/01/2020   Procedure: CARDIOVERSION;  Surgeon: Josue Hector, MD;  Location: [facility];  Service: Cardiovascular;  Laterality: N/A;   COLONOSCOPY WITH PROPOFOL N/A 04/05/2020   Procedure: COLONOSCOPY WITH PROPOFOL;  Surgeon: Carol Ada, MD;  Location: [facility];  Service: Endoscopy;  Laterality: N/A;   CORONARY ARTERY BYPASS GRAFT  1998   ELECTROMAGNETIC NAVIGATION BROCHOSCOPY  12/27/2019   Procedure: NAVIGATION BRONCHOSCOPY;  Surgeon: Garner Nash, DO;  Location: [facility];  Service: Pulmonary;;   ESOPHAGOGASTRODUODENOSCOPY (EGD) WITH PROPOFOL N/A 04/05/2020   Procedure: ESOPHAGOGASTRODUODENOSCOPY (EGD) WITH PROPOFOL;  Surgeon: Carol Ada, MD;  Location: [facility];  Service: Endoscopy;  Laterality: N/A;   FIDUCIAL MARKER PLACEMENT  12/27/2019   Procedure: FIDUCIAL MARKER PLACEMENT;  Surgeon: Garner Nash, DO;  Location: [facility];  Service: Pulmonary;;   HOT HEMOSTASIS N/A 04/05/2020   Procedure: HOT HEMOSTASIS (ARGON PLASMA COAGULATION/BICAP);  Surgeon: Carol Ada, MD;  Location: [facility];  Service: Endoscopy;  Laterality: N/A;   [facility]   for cancer   POLYPECTOMY  04/05/2020   Procedure: POLYPECTOMY;  Surgeon: Carol Ada, MD;  Location: [facility];  Service: Endoscopy;;   TOOTH EXTRACTION  05/27/2013   tooth extraction with bone graft- several -for Implants   VIDEO BRONCHOSCOPY WITH ENDOBRONCHIAL NAVIGATION N/A 12/27/2019   Procedure: VIDEO BRONCHOSCOPY;   Surgeon: Garner Nash, DO;  Location: [facility];  Service: Pulmonary;  Laterality: N/A;    REVIEW OF SYSTEMS:  A comprehensive review of systems was negative except for: Constitutional: positive for fatigue Respiratory: positive for dyspnea on exertion   PHYSICAL EXAMINATION: General appearance: alert, cooperative, and no distress Head: Normocephalic, without obvious abnormality, atraumatic Neck: no adenopathy, no JVD, supple, symmetrical, trachea midline, and thyroid not enlarged, symmetric, no tenderness/mass/nodules Lymph nodes: Cervical, supraclavicular, and axillary nodes normal. Resp: clear to auscultation bilaterally Back: symmetric, no curvature. ROM normal. No CVA tenderness. Cardio: regular rate and rhythm, S1, S2 normal, no murmur, click, rub or gallop GI: soft, non-tender; bowel sounds normal; no masses,  no organomegaly Extremities: extremities normal, atraumatic, no cyanosis or edema  ECOG PERFORMANCE STATUS: 1 - Symptomatic but completely ambulatory  Blood pressure (!) 108/54, pulse 61, temperature 97.7 F (36.5 C), temperature source Oral, resp. rate 18, height 6' (1.829 m), weight 209 lb 4.8 oz (94.9 kg), SpO2 96 %.  LABORATORY DATA: Lab Results  Component Value Date   WBC 4.9 07/24/2021   HGB 9.8 (L) 07/24/2021   HCT 33.0 (L) 07/24/2021   MCV 82.9 07/24/2021   PLT 198 07/24/2021      Chemistry      Component Value Date/Time   NA 143 01/21/2021 1025   NA 145 (H) 10/04/2020 1559   K 3.6 01/21/2021 1025   CL 108 01/21/2021 1025   CO2 28 01/21/2021 1025   BUN 8 01/21/2021 1025   BUN 8 10/04/2020 1559   CREATININE 1.00 07/19/2021 1243   CREATININE 0.80 01/21/2021 1025   CREATININE 1.41 (H) 09/14/2019 0943      Component Value Date/Time   CALCIUM 8.7 (L) 01/21/2021 1025   ALKPHOS 91 01/21/2021 1025   AST 18 01/21/2021 1025   ALT 9 01/21/2021 1025   BILITOT 0.6 01/21/2021 1025       RADIOGRAPHIC STUDIES: CT Chest W Contrast  Result Date:  07/22/2021 CLINICAL DATA:  75 year old male with history of small cell lung cancer. Staging examination. EXAM: CT CHEST WITH CONTRAST TECHNIQUE: Multidetector CT imaging of the chest was performed during intravenous contrast administration. CONTRAST:  98mL OMNIPAQUE IOHEXOL 350 MG/ML SOLN COMPARISON:  Chest CT 01/21/2021. FINDINGS: Cardiovascular: Heart size is normal. There is no significant pericardial fluid, thickening or pericardial calcification. There is aortic atherosclerosis, as well as atherosclerosis of the great vessels of the mediastinum  and the coronary arteries, including calcified atherosclerotic plaque in the left main, left anterior descending, left circumflex and right coronary arteries. Status post median sternotomy for CABG including LIMA to the LAD. Mediastinum/Nodes: No pathologically enlarged mediastinal or hilar lymph nodes. Esophagus is unremarkable in appearance. No axillary lymphadenopathy. Lungs/Pleura: In the right upper lobe near the apex there are again fiducial markers with adjacent areas of septal thickening and nodular architectural distortion which are similar to recent prior examinations, most compatible with chronic postradiation mass-like fibrosis. The originally treated nodule is now obscured by these changes. Small bilateral pleural effusions are noted and have increased slightly with some associated passive subsegmental atelectasis in the lower lobes of the lungs bilaterally. No definite new suspicious appearing pulmonary nodules or masses are noted. No acute consolidative airspace disease. Upper Abdomen: Aortic atherosclerosis. Numerous partially calcified gallstones and biliary sludge filling the visualized portions of the gallbladder. Incompletely imaged low-attenuation lesion in the interpolar region of the left kidney, similar to the prior study, likely a cyst. Trace volume of ascites. Musculoskeletal: Median sternotomy wires. Healing nondisplaced fracture of the  anterolateral aspect of the left eighth rib, new compared to the prior examination. There are no aggressive appearing lytic or blastic lesions noted in the visualized portions of the skeleton. IMPRESSION: 1. Chronic postradiation mass-like fibrosis near the apex of the right upper lobe, similar to the prior study. No findings to suggest local recurrence of disease or definite metastatic disease in the thorax. 2. Increasing small chronic bilateral pleural effusions with passive subsegmental atelectasis in the lower lobes of the lungs bilaterally. 3. Aortic atherosclerosis, in addition to left main and 3 vessel coronary artery disease. Status post median sternotomy for CABG including LIMA to the LAD. 4. Cholelithiasis and biliary sludge. Aortic Atherosclerosis (ICD10-I70.0). Electronically Signed   By: Vinnie Langton M.D.   On: 07/22/2021 06:00     ASSESSMENT AND PLAN: This is a very pleasant 75 years old white male with limited stage, stage Ia small cell lung cancer presented with right upper lobe pulmonary nodule diagnosed in April 2021. The patient underwent a course of systemic chemotherapy with carboplatin and etoposide in addition to SBRT to the right upper lobe lung nodule.  Status post 4 cycles. This was followed by prophylactic cranial irradiation. The patient is currently on observation and he is feeling fine today with no concerning complaints. He had repeat CT scan of the chest performed recently.  I personally and independently reviewed the scan images and discussed the results with the patient and his wife. His scan showed no concerning findings for disease progression. I recommended for him to continue on observation with repeat CT scan of the chest in 6 months. The patient was advised to call immediately if he has any concerning symptoms in the interval. The patient voices understanding of current disease status and treatment options and is in agreement with the current care plan.  All  questions were answered. The patient knows to call the clinic with any problems, questions or concerns. We can certainly see the patient much sooner if necessary.  Disclaimer: This note was dictated with voice recognition software. Similar sounding words can inadvertently be transcribed and may not be corrected upon review.

## 2021-08-02 ENCOUNTER — Telehealth: Payer: Self-pay | Admitting: Internal Medicine

## 2021-08-05 ENCOUNTER — Encounter: Payer: Self-pay | Admitting: Family Medicine

## 2021-08-06 ENCOUNTER — Encounter: Payer: Self-pay | Admitting: Medical-Surgical

## 2021-08-06 ENCOUNTER — Ambulatory Visit (INDEPENDENT_AMBULATORY_CARE_PROVIDER_SITE_OTHER): Payer: Medicare Other | Admitting: Medical-Surgical

## 2021-08-06 ENCOUNTER — Other Ambulatory Visit: Payer: Self-pay

## 2021-08-06 VITALS — BP 100/69 | HR 56 | Resp 20 | Ht 72.0 in | Wt 197.4 lb

## 2021-08-06 DIAGNOSIS — J101 Influenza due to other identified influenza virus with other respiratory manifestations: Secondary | ICD-10-CM

## 2021-08-06 DIAGNOSIS — B9689 Other specified bacterial agents as the cause of diseases classified elsewhere: Secondary | ICD-10-CM

## 2021-08-06 DIAGNOSIS — R6889 Other general symptoms and signs: Secondary | ICD-10-CM

## 2021-08-06 DIAGNOSIS — J329 Chronic sinusitis, unspecified: Secondary | ICD-10-CM

## 2021-08-06 LAB — POCT INFLUENZA A/B
Influenza A, POC: NEGATIVE
Influenza B, POC: POSITIVE — AB

## 2021-08-06 MED ORDER — HYDROCODONE BIT-HOMATROP MBR 5-1.5 MG/5ML PO SOLN: 5.0000 mL | Freq: Three times a day (TID) | ORAL | 0 refills | Status: DC | PRN

## 2021-08-06 MED ORDER — OSELTAMIVIR PHOSPHATE 75 MG PO CAPS: 75.0000 mg | ORAL_CAPSULE | Freq: Two times a day (BID) | ORAL | 0 refills | Status: DC

## 2021-08-06 MED ORDER — AMOXICILLIN-POT CLAVULANATE 875-125 MG PO TABS: 1.0000 | ORAL_TABLET | Freq: Two times a day (BID) | ORAL | 0 refills | Status: AC

## 2021-08-06 NOTE — Progress Notes (Signed)
  HPI with pertinent ROS:   CC: sinus symptoms  HPI: Pleasant 75 year old male presenting today with reports of upper respiratory symptoms that started approximately 10 days ago.  Originally started with cold signs and symptoms accompanied by significant fatigue.  This started to get better as time passed and he treated it conservatively.  Over the weekend, his symptoms suddenly took a turn for the worse and he now has a nonproductive cough, decreased appetite, upper respiratory congestion, and frontal sinus pressure/pain.  Has been using over-the-counter medications with minimal relief.  He has tested for COVID with negative results.  Denies fever, chills, shortness of breath, chest pain, and GI side effects.  I reviewed the past medical history, family history, social history, surgical history, and allergies today and no changes were needed.  Please see the problem list section below in epic for further details.   Physical exam:   General: Well Developed, well nourished, and in no acute distress.  Neuro: Alert and oriented x3.  HEENT: Normocephalic, atraumatic.  Skin: Warm and dry. Cardiac: Regular rate and rhythm, no murmurs rubs or gallops, no lower extremity edema.  Respiratory: Clear to auscultation bilaterally. Not using accessory muscles, speaking in full sentences.  Impression and Recommendations:    1. Flu-like symptoms POCT positive for flu B, negative for flu A.  - POCT Influenza A/B  2. Influenza B Tamiflu 75mg  BID x 5 days. Reviewed supportive treatments with OTC medications made for patients with high blood pressure.   3. Bacterial sinusitis Original sickness started 10 days ago with improvement then rapid worsening. Treating with Augmentin for bacterial sinusitis coinfection.   Return if symptoms worsen or fail to improve. ___________________________________________ Clearnce Sorrel, DNP, APRN, FNP-BC [facility] [place]

## 2021-08-13 ENCOUNTER — Other Ambulatory Visit: Payer: Self-pay | Admitting: Radiation Oncology

## 2021-08-14 DIAGNOSIS — G4733 Obstructive sleep apnea (adult) (pediatric): Secondary | ICD-10-CM | POA: Diagnosis not present

## 2021-08-25 ENCOUNTER — Other Ambulatory Visit (HOSPITAL_COMMUNITY): Payer: Self-pay | Admitting: Cardiovascular Disease

## 2021-09-30 ENCOUNTER — Other Ambulatory Visit (HOSPITAL_COMMUNITY): Payer: Self-pay | Admitting: Cardiovascular Disease

## 2021-10-15 ENCOUNTER — Other Ambulatory Visit: Payer: Self-pay | Admitting: Cardiovascular Disease

## 2021-10-18 ENCOUNTER — Other Ambulatory Visit: Payer: Self-pay

## 2021-10-18 ENCOUNTER — Encounter (HOSPITAL_COMMUNITY): Payer: Self-pay | Admitting: Physician Assistant

## 2021-10-18 ENCOUNTER — Ambulatory Visit (HOSPITAL_COMMUNITY)
Admission: RE | Admit: 2021-10-18 | Discharge: 2021-10-18 | Disposition: A | Discharge status: Home / Self Care | Payer: Medicare Other | Source: Ambulatory Visit | Attending: Physician Assistant | Admitting: Physician Assistant

## 2021-10-18 VITALS — BP 104/80 | HR 93 | Ht 72.0 in | Wt 198.0 lb

## 2021-10-18 DIAGNOSIS — R7989 Other specified abnormal findings of blood chemistry: Secondary | ICD-10-CM | POA: Insufficient documentation

## 2021-10-18 DIAGNOSIS — I739 Peripheral vascular disease, unspecified: Secondary | ICD-10-CM | POA: Diagnosis not present

## 2021-10-18 DIAGNOSIS — I10 Essential (primary) hypertension: Secondary | ICD-10-CM | POA: Diagnosis not present

## 2021-10-18 DIAGNOSIS — Z7901 Long term (current) use of anticoagulants: Secondary | ICD-10-CM | POA: Diagnosis not present

## 2021-10-18 DIAGNOSIS — I255 Ischemic cardiomyopathy: Secondary | ICD-10-CM | POA: Diagnosis not present

## 2021-10-18 DIAGNOSIS — Z951 Presence of aortocoronary bypass graft: Secondary | ICD-10-CM | POA: Insufficient documentation

## 2021-10-18 DIAGNOSIS — I2510 Atherosclerotic heart disease of native coronary artery without angina pectoris: Secondary | ICD-10-CM | POA: Diagnosis not present

## 2021-10-18 DIAGNOSIS — G4733 Obstructive sleep apnea (adult) (pediatric): Secondary | ICD-10-CM | POA: Insufficient documentation

## 2021-10-18 DIAGNOSIS — D649 Anemia, unspecified: Secondary | ICD-10-CM | POA: Diagnosis not present

## 2021-10-18 DIAGNOSIS — Z79899 Other long term (current) drug therapy: Secondary | ICD-10-CM | POA: Insufficient documentation

## 2021-10-18 DIAGNOSIS — Z9989 Dependence on other enabling machines and devices: Secondary | ICD-10-CM | POA: Diagnosis not present

## 2021-10-18 DIAGNOSIS — Z8719 Personal history of other diseases of the digestive system: Secondary | ICD-10-CM | POA: Diagnosis not present

## 2021-10-18 DIAGNOSIS — C3490 Malignant neoplasm of unspecified part of unspecified bronchus or lung: Secondary | ICD-10-CM | POA: Diagnosis not present

## 2021-10-18 DIAGNOSIS — R450 Nervousness: Secondary | ICD-10-CM | POA: Diagnosis not present

## 2021-10-18 DIAGNOSIS — I4811 Longstanding persistent atrial fibrillation: Secondary | ICD-10-CM | POA: Diagnosis not present

## 2021-10-18 DIAGNOSIS — I4510 Unspecified right bundle-branch block: Secondary | ICD-10-CM | POA: Insufficient documentation

## 2021-10-18 DIAGNOSIS — D6869 Other thrombophilia: Secondary | ICD-10-CM | POA: Diagnosis not present

## 2021-10-18 DIAGNOSIS — Z9221 Personal history of antineoplastic chemotherapy: Secondary | ICD-10-CM | POA: Insufficient documentation

## 2021-10-18 DIAGNOSIS — I484 Atypical atrial flutter: Secondary | ICD-10-CM | POA: Diagnosis not present

## 2021-10-18 MED ORDER — DILTIAZEM HCL ER COATED BEADS 120 MG PO CP24: 120.0000 mg | ORAL_CAPSULE | Freq: Every day | ORAL | 3 refills | Status: DC

## 2021-10-18 NOTE — Patient Instructions (Signed)
Stop multaq  Start Cardizem 120mg  once a day  Continue metoprolol at 100mg  once a day

## 2021-10-18 NOTE — Progress Notes (Signed)
Primary Care Physician: Luetta Nutting, DO Referring Physician: Dr. Gwenlyn Found EP: Dr. Casandra Doffing Richard is a 76 y.o. Hogan with a h/o  PAF, OSA using cpap, HTN, CAD, s/p bypass, s/p ablation x 2, that is here for f/u, on Multaq, as [name] has had afib since last week occurring on vacation  while [name] was at the beach.  [name] is tolerating fairly well, but notices more nervousness. [name] tracks his HR on his phone app. [name] saw Dr. Rayann Heman  last  04/04/15 at which time [name] was offered repeat ablation,  Multaq, sotalol or tikosyn. [name] chose multaq and has kept [name] in rhythm  X almost 3 years. [name] did require a cardioversion 7 months ago.  Otherwise health has been at his baseline. Continues on xarelto without any missed doses. His qt is of concern usually around 500 ms,(RBBB contributing ), longer today in atrial flutter. [name] is fatigued out of rhythm and prefers to be in SR. [name] also prefers to avoid another ablation.   F/u in afib clinic,7/30. Unfortunately, [name] did not convert with cardioversion, but 3 days later, [name] returned to [place] , and continues to stay in rhythm. [name] feels improved. I discussed with Dr. Rayann Heman  prior to [place] as his options are limited. [name]  thought repeat cardioversion was the best option  for pt.   F/u virtual afib clinic 01/28/19. Pt reports no major issues with afib. Will feel his HR pick up at times but can get it back in rhythm in a matter of minutes with deep breaths. [name] had a prolonged URI in December, was tested for covid antibodies recently and was negative. [name] is having to use prn lasix once a week and will lose 3-4 lbs but has gotten into the habit of eating pretzels daily. [name] is now able to get out and golf and [name] is happy about that. [name] is taking covid precautions.  Follow up in the AF clinic 12/14/19. Patient reports [name] is doing very well since his last visit. [name] has lost between 15-20 lbs by reducing his portion sizes. [name] has had two episodes of heart racing which resolved in <2 minutes.  [name] denies bleeding issues with anticoagulation.   Follow up in the AF clinic 04/19/20. Patient has been diagnosed with early stages of small cell lung cancer. Patient was admitted 04/03/20-04/06/20 for acute anemia requiring transfusion. Anemia appeared to be multifactorial with bone marrow suppression from chemo and GI bleeding. Upper and lower EGD were negative for active bleeding. Xarelto was resumed once platelets were >50,000. [name] reports that [name] has felt fatigued since discharge. ECG on 04/10/20 showed [name] was back out of rhythm.   Follow up in the AF clinic 05/09/20. Patient is s/p DCCV on 05/01/20. [name] feels much better with more energy. [name] denies any bleeding issues on anticoagulation. [name] starts his cancer treatments next week.   Follow up in the AF clinic 10/18/21. Patient reports that [name] has noted a higher heart rate in the past week. [name] stopped Multaq and doubled his BB on his own. On review of previous cardiac notes and ECGs, it appears [name] has been in afib since 08/2020. [name] is mostly asymptomatic but does not sleep well when his heart rate is elevated. Of note, [name] was diagnosed with lung cancer in the interim and is being treated by Dr Julien Nordmann.   Today, [name] denies symptoms of palpitations, chest pain, shortness of breath, orthopnea, PND, lower extremity edema, dizziness, presyncope, syncope,  or neurologic sequela. The patient is tolerating medications without difficulties and is otherwise without complaint today.   Past Medical History:  Diagnosis Date   Atypical atrial flutter ([place])    Cancer (HCC)    Testicular- surgery    COPD (chronic obstructive pulmonary disease) (HCC)    Coronary artery disease    Diabetes mellitus without complication (HCC)    Diastolic dysfunction, left ventricle 05/2018   Dr. Rayann Heman- Cardiologist   Dysrhythmia    chronic AFib   History of kidney stones    passed   HLD (hyperlipidemia)    HTN (hypertension)    Myocardial infarction ([place])    x 2 maybe 1 more   OSA  (obstructive sleep apnea)    uses CPAP   Peripheral artery disease ([place])    pseudoaneurysm post afib ablation at [facility] 2011, s/p bilateral iliac stents   Persistent atrial fibrillation (HCC)    Pre-diabetes    Renal artery stenosis ([place])    right renal artery PTA and stenting   S/P coronary artery bypass graft x 7 11/16/96   Past Surgical History:  Procedure Laterality Date   2-D echocardiogram  03/25/2010   Normal left ventricular function. Mild MR, TR, trivial AR   ABDOMINAL AORTOGRAM W/LOWER EXTREMITY Bilateral 10/08/2020   Procedure: ABDOMINAL AORTOGRAM W/LOWER EXTREMITY;  Surgeon: Lorretta Harp, MD;  Location: [place] [facility];  Service: Cardiovascular;  Laterality: Bilateral;   ATRIAL FIBRILLATION ABLATION  03/27/2010, 12/31/2010   [facility], Dr. Edwin Cap SURGERY  2004, 2007   Laminectomy, Discedectomy x 2   BRONCHIAL BIOPSY  12/27/2019   Procedure: BRONCHIAL BIOPSIES;  Surgeon: Garner Nash, DO;  Location: [place] [facility];  Service: Pulmonary;;   BRONCHIAL BRUSHINGS  12/27/2019   Procedure: BRONCHIAL BRUSHINGS;  Surgeon: Garner Nash, DO;  Location: [place];  Service: Pulmonary;;   BRONCHIAL NEEDLE ASPIRATION BIOPSY  12/27/2019   Procedure: BRONCHIAL NEEDLE ASPIRATION BIOPSIES;  Surgeon: Garner Nash, DO;  Location: [facility];  Service: Pulmonary;;   cardiac stress test  11/21/2009   Exercise capacity 5 METS. No significant ischemia demonstrated   CARDIOVERSION N/A 03/15/2015   Procedure: CARDIOVERSION;  Surgeon: Lorretta Harp, MD;  Location: [place];  Service: Cardiovascular;  Laterality: N/A;   CARDIOVERSION N/A 07/10/2017   Procedure: CARDIOVERSION;  Surgeon: Pixie Casino, MD;  Location: [place];  Service: Cardiovascular;  Laterality: N/A;   CARDIOVERSION N/A 03/23/2018   Procedure: CARDIOVERSION;  Surgeon: Sanda Klein, MD;  Location: [facility];  Service: Cardiovascular;  Laterality: N/A;   CARDIOVERSION N/A 05/01/2020   Procedure:  CARDIOVERSION;  Surgeon: Josue Hector, MD;  Location: [facility];  Service: Cardiovascular;  Laterality: N/A;   COLONOSCOPY WITH PROPOFOL N/A 04/05/2020   Procedure: COLONOSCOPY WITH PROPOFOL;  Surgeon: Carol Ada, MD;  Location: [facility];  Service: Endoscopy;  Laterality: N/A;   CORONARY ARTERY BYPASS GRAFT  1998   ELECTROMAGNETIC NAVIGATION BROCHOSCOPY  12/27/2019   Procedure: NAVIGATION BRONCHOSCOPY;  Surgeon: Garner Nash, DO;  Location: [place] [facility];  Service: Pulmonary;;   ESOPHAGOGASTRODUODENOSCOPY (EGD) WITH PROPOFOL N/A 04/05/2020   Procedure: ESOPHAGOGASTRODUODENOSCOPY (EGD) WITH PROPOFOL;  Surgeon: Carol Ada, MD;  Location: [facility];  Service: Endoscopy;  Laterality: N/A;   FIDUCIAL MARKER PLACEMENT  12/27/2019   Procedure: FIDUCIAL MARKER PLACEMENT;  Surgeon: Garner Nash, DO;  Location: [place];  Service: Pulmonary;;   HOT HEMOSTASIS N/A 04/05/2020   Procedure: HOT HEMOSTASIS (ARGON PLASMA COAGULATION/BICAP);  Surgeon: Carol Ada, MD;  Location: [facility];  Service: Endoscopy;  Laterality: N/A;   [place]   for cancer   POLYPECTOMY  04/05/2020   Procedure: POLYPECTOMY;  Surgeon: Carol Ada, MD;  Location: [facility];  Service: Endoscopy;;   TOOTH EXTRACTION  05/27/2013   tooth extraction with bone graft- several -for Implants   VIDEO BRONCHOSCOPY WITH ENDOBRONCHIAL NAVIGATION N/A 12/27/2019   Procedure: VIDEO BRONCHOSCOPY;  Surgeon: Garner Nash, DO;  Location: [place];  Service: Pulmonary;  Laterality: N/A;    Current Outpatient Medications  Medication Sig Dispense Refill   acetaminophen (TYLENOL) 500 MG tablet Take 1,000 mg by mouth every 6 (six) hours as needed for mild pain or moderate pain.     atorvastatin (LIPITOR) 10 MG tablet TAKE 1 TABLET(10 MG) BY MOUTH DAILY 90 tablet 1   diltiazem (CARDIZEM CD) 120 MG 24 hr capsule Take 1 capsule (120 mg total) by mouth daily. 30 capsule 3   ferrous sulfate 324 MG TBEC Take 324 mg  by mouth.     gabapentin (NEURONTIN) 300 MG capsule TAKE 2 CAPSULES(600 MG) BY MOUTH AT BEDTIME 180 capsule 2   HYDROcodone bit-homatropine (HYCODAN) 5-1.5 MG/5ML syrup Take 5 mLs by mouth every 8 (eight) hours as needed for cough. 60 mL 0   ketotifen (ZADITOR) 0.025 % ophthalmic solution Place 1 drop into both eyes 2 (two) times daily as needed (allergies).     metoprolol succinate (TOPROL-XL) 100 MG 24 hr tablet TAKE 1 TABLET BY MOUTH EVERY DAY 90 tablet 0   Multiple Vitamin (MULTIVITAMIN WITH MINERALS) TABS tablet Take 1 tablet by mouth daily. Centrum silver     niacin (NIASPAN) 1000 MG CR tablet TAKE 1 TABLET(1000 MG) BY MOUTH AT BEDTIME 90 tablet 1   nicotine polacrilex (COMMIT) 2 MG lozenge Take 2 mg by mouth daily as needed for smoking cessation.      nitroGLYCERIN (NITROSTAT) 0.4 MG SL tablet Place 1 tablet (0.4 mg total) under the tongue every 5 (five) minutes as needed. 25 tablet 3   NON FORMULARY CPAP at night     oseltamivir (TAMIFLU) 75 MG capsule Take 1 capsule (75 mg total) by mouth 2 (two) times daily. 10 capsule 0   oxymetazoline (AFRIN) 0.05 % nasal spray Place 1 spray into both nostrils daily as needed for congestion.      prochlorperazine (COMPAZINE) 10 MG tablet TAKE 1 TABLET(10 MG) BY MOUTH EVERY 6 HOURS AS NEEDED FOR NAUSEA OR VOMITING 30 tablet 0   sertraline (ZOLOFT) 50 MG tablet TAKE 1 TABLET(50 MG) BY MOUTH DAILY 90 tablet 1   traMADol (ULTRAM) 50 MG tablet TAKE 1 TABLET(50 MG) BY MOUTH EVERY 12 HOURS AS NEEDED 30 tablet 1   traZODone (DESYREL) 50 MG tablet TAKE 1/2 TO 1 TABLET BY MOUTH AT BEDTIME AS NEEDED FOR SLEEP 90 tablet 3   XARELTO 20 MG TABS tablet TAKE 1 TABLET BY MOUTH EVERY DAY WITH SUPPER 90 tablet 1   albuterol (VENTOLIN HFA) 108 (90 Base) MCG/ACT inhaler Inhale 2 puffs into the lungs every 6 (six) hours as needed. 18 g 2   Current Facility-Administered Medications  Medication Dose Route Frequency Provider Last Rate Last Admin   sodium chloride flush (NS)  0.9 % injection 3 mL  3 mL Intravenous Q12H Lorretta Harp, MD        No Known Allergies  Social History   Socioeconomic History   Marital status: Divorced    Spouse name: Not on file   Number of  children: 2   Years of education: 16   Highest education level: Associate degree: academic program  Occupational History   Occupation: retired    Comment: Community education officer of [employer]  Tobacco Use   Smoking status: Former    Packs/day: 1.50    Years: 30.00    Pack years: 45.00    Types: Cigarettes, Cigars    Quit date: 09/01/1994    Years since quitting: 27.1   Smokeless tobacco: Never   Tobacco comments:    cigars quit  - 11/16/2019  Vaping Use   Vaping Use: Never used  Substance and Sexual Activity   Alcohol use: Not Currently   Drug use: No   Sexual activity: Yes  Other Topics Concern   Not on file  Social History Narrative   Pt lives in [place] alone.  Divorced. 2 cups of coffee in the morning.   Retired from [employer])   Social Determinants of Health   Financial Resource Strain: Not on file  Food Insecurity: Not on file  Transportation Needs: Not on file  Physical Activity: Not on file  Stress: Not on file  Social Connections: Not on file  Intimate Partner Violence: Not on file    Family History  Problem Relation Age of Onset   Cancer Mother    Cancer Father    Other Brother        NO MEDICAL PROBLEMS   Other Son        NO MEDICAL PROBLEMS   Leukemia Daughter 6       Recover and has no other problems    ROS- All systems are reviewed and negative except as per the HPI above  Physical Exam: Vitals:   10/18/21 0833  BP: 104/80  Pulse: 93  Weight: 89.8 kg  Height: 6' (1.829 m)    Wt Readings from Last 3 Encounters:  10/18/21 89.8 kg  08/06/21 89.5 kg  07/24/21 94.9 kg    Labs: Lab Results  Component Value Date   NA 139 07/24/2021   K 3.8 07/24/2021   CL 108 07/24/2021   CO2 24 07/24/2021   GLUCOSE 117 (H) 07/24/2021    BUN 10 07/24/2021   CREATININE 0.84 07/24/2021   CALCIUM 7.9 (L) 07/24/2021   Lab Results  Component Value Date   INR 1.1 12/27/2019   Lab Results  Component Value Date   CHOL 97 08/20/2020   HDL 53 08/20/2020   LDLCALC 28 08/20/2020   TRIG 77 08/20/2020    GEN- The patient is a well appearing Hogan, alert and oriented x 3 today.   HEENT-head normocephalic, atraumatic, sclera clear, conjunctiva pink, hearing intact, trachea midline. Lungs- Clear to ausculation bilaterally, normal work of breathing Heart- Regular rate and rhythm, no murmurs, rubs or gallops  GI- soft, NT, ND, + BS Extremities- no clubbing, cyanosis, or edema MS- no significant deformity or atrophy Skin- no rash or lesion Psych- euthymic mood, full affect Neuro- strength and sensation are intact   EKG-  Atypical atrial flutter vs accelerated junctional rhythm, RBBB Vent. rate 93 BPM PR interval * ms QRS duration 140 ms QT/QTcB 438/544 ms  Echo 04/24/20  1. Left ventricular ejection fraction, by estimation, is 40%. The left  ventricle has mildly decreased function. The left ventricle demonstrates  global hypokinesis. The left ventricular internal cavity size was mildly  dilated. Left ventricular diastolic parameters are indeterminate.   2. Right ventricular systolic function is mildly reduced. The right  ventricular size is  normal. There is normal pulmonary artery systolic  pressure. The estimated right ventricular systolic pressure is 73.4 mmHg.   3. Right atrial size was mildly dilated.   4. The mitral valve is normal in structure. No evidence of mitral valve  regurgitation. No evidence of mitral stenosis.   5. The aortic valve is tricuspid. Aortic valve regurgitation is not  visualized. No aortic stenosis is present.   6. The inferior vena cava is dilated in size with >50% respiratory  variability, suggesting right atrial pressure of 8 mmHg.   7. The patient appeared to be in atrial flutter.   Epic  records reviewed   Assessment and Plan: 1.  Longstanding persistent atrial fibrillation/atypical atrial flutter S/p PVI x2 at [facility]. Patient had DCCV 05/01/20 but was back in afib at follow up 08/2020, now appears longstanding persistent.  We discussed therapeutic options with rate vs rhythm control. Could consider resuming Multaq with DCCV but I'm not sure this would be effective given how quickly his afib returned after his last DCCV. QT is marginal for sotalol or dofetilide, failed amiodarone 2/2 elevated LFTs and abnormal PFTs. Will pursue rate control for now. Stop Multaq Continue Xarelto 20 mg daily Continue Toprol 100 mg daily Start diltiazem 120 mg daily  This patients CHA2DS2-VASc Score and unadjusted Ischemic Stroke Rate (% per year) is equal to 4.8 % stroke rate/year from a score of 4  Above score calculated as 1 point each if present [CHF, HTN, DM, Vascular=MI/PAD/Aortic Plaque, Age if 65-74, or Hogan] Above score calculated as 2 points each if present [Age > 75, or Stroke/TIA/TE]  2. HTN Stable, med changes as above.   3. CAD/ischemic CM S/p CABG 1998. No anginal symptoms.  4. OSA Followed by Dr Claiborne Billings.  5. PAD External iliac artery stents bilaterally Followed by Dr Gwenlyn Found.   Follow up in the AF clinic in 2-3 weeks.    [facility] [address] [place], [place] [address] [phone]

## 2021-11-02 ENCOUNTER — Other Ambulatory Visit: Payer: Self-pay | Admitting: Family Medicine

## 2021-11-02 ENCOUNTER — Other Ambulatory Visit: Payer: Self-pay | Admitting: Cardiovascular Disease

## 2021-11-06 ENCOUNTER — Ambulatory Visit (HOSPITAL_COMMUNITY)
Admission: RE | Admit: 2021-11-06 | Discharge: 2021-11-06 | Disposition: A | Discharge status: Home / Self Care | Payer: Medicare Other | Source: Ambulatory Visit | Attending: Physician Assistant | Admitting: Physician Assistant

## 2021-11-06 ENCOUNTER — Encounter (HOSPITAL_COMMUNITY): Payer: Self-pay | Admitting: Physician Assistant

## 2021-11-06 ENCOUNTER — Other Ambulatory Visit: Payer: Self-pay

## 2021-11-06 VITALS — BP 102/76 | HR 97 | Ht 72.0 in | Wt 207.4 lb

## 2021-11-06 DIAGNOSIS — I4811 Longstanding persistent atrial fibrillation: Secondary | ICD-10-CM

## 2021-11-06 DIAGNOSIS — Z951 Presence of aortocoronary bypass graft: Secondary | ICD-10-CM | POA: Diagnosis not present

## 2021-11-06 DIAGNOSIS — I484 Atypical atrial flutter: Secondary | ICD-10-CM | POA: Diagnosis not present

## 2021-11-06 DIAGNOSIS — D6869 Other thrombophilia: Secondary | ICD-10-CM | POA: Diagnosis not present

## 2021-11-06 DIAGNOSIS — G4733 Obstructive sleep apnea (adult) (pediatric): Secondary | ICD-10-CM | POA: Insufficient documentation

## 2021-11-06 DIAGNOSIS — I255 Ischemic cardiomyopathy: Secondary | ICD-10-CM | POA: Insufficient documentation

## 2021-11-06 DIAGNOSIS — Z9989 Dependence on other enabling machines and devices: Secondary | ICD-10-CM | POA: Diagnosis not present

## 2021-11-06 DIAGNOSIS — Z79899 Other long term (current) drug therapy: Secondary | ICD-10-CM | POA: Insufficient documentation

## 2021-11-06 DIAGNOSIS — Z95820 Peripheral vascular angioplasty status with implants and grafts: Secondary | ICD-10-CM | POA: Insufficient documentation

## 2021-11-06 DIAGNOSIS — Z7901 Long term (current) use of anticoagulants: Secondary | ICD-10-CM | POA: Insufficient documentation

## 2021-11-06 DIAGNOSIS — D649 Anemia, unspecified: Secondary | ICD-10-CM | POA: Diagnosis not present

## 2021-11-06 DIAGNOSIS — R9431 Abnormal electrocardiogram [ECG] [EKG]: Secondary | ICD-10-CM | POA: Diagnosis not present

## 2021-11-06 DIAGNOSIS — C3490 Malignant neoplasm of unspecified part of unspecified bronchus or lung: Secondary | ICD-10-CM | POA: Diagnosis not present

## 2021-11-06 DIAGNOSIS — I4510 Unspecified right bundle-branch block: Secondary | ICD-10-CM | POA: Diagnosis not present

## 2021-11-06 DIAGNOSIS — R450 Nervousness: Secondary | ICD-10-CM | POA: Diagnosis not present

## 2021-11-06 DIAGNOSIS — I2510 Atherosclerotic heart disease of native coronary artery without angina pectoris: Secondary | ICD-10-CM | POA: Diagnosis not present

## 2021-11-06 DIAGNOSIS — I10 Essential (primary) hypertension: Secondary | ICD-10-CM | POA: Insufficient documentation

## 2021-11-06 DIAGNOSIS — E1151 Type 2 diabetes mellitus with diabetic peripheral angiopathy without gangrene: Secondary | ICD-10-CM | POA: Insufficient documentation

## 2021-11-06 DIAGNOSIS — Z9221 Personal history of antineoplastic chemotherapy: Secondary | ICD-10-CM | POA: Diagnosis not present

## 2021-11-06 NOTE — Patient Instructions (Signed)
Stop Diltiazem  ? ?Take lasix daily for 3 days then stop ?

## 2021-11-06 NOTE — Progress Notes (Signed)
Primary Care Physician: Luetta Nutting, DO Referring Physician: Dr. Gwenlyn Found EP: Dr. Casandra Doffing Crudup is a 76 y.o. male with a h/o  PAF, OSA using cpap, HTN, CAD, s/p bypass, s/p ablation x 2, that is here for f/u, on Multaq, as he has had afib since last week occurring on vacation  while he was at the beach.  He is tolerating fairly well, but notices more nervousness. He tracks his HR on his phone app. He saw Dr. Rayann Heman  last  04/04/15 at which time he was offered repeat ablation,  Multaq, sotalol or tikosyn. He chose multaq and has kept him in rhythm  X almost 3 years. He did require a cardioversion 7 months ago.  Otherwise health has been at his baseline. Continues on xarelto without any missed doses. His qt is of concern usually around 500 ms,(RBBB contributing ), longer today in atrial flutter. He is fatigued out of rhythm and prefers to be in SR. He also prefers to avoid another ablation.   F/u in afib clinic,7/30. Unfortunately, he did not convert with cardioversion, but 3 days later, he returned to [place] , and continues to stay in rhythm. He feels improved. I discussed with Dr. Rayann Heman  prior to [place] as his options are limited. He  thought repeat cardioversion was the best option  for pt.   F/u virtual afib clinic 01/28/19. Pt reports no major issues with afib. Will feel his HR pick up at times but can get it back in rhythm in a matter of minutes with deep breaths. He had a prolonged URI in December, was tested for covid antibodies recently and was negative. He is having to use prn lasix once a week and will lose 3-4 lbs but has gotten into the habit of eating pretzels daily. He is now able to get out and golf and he is happy about that. He is taking covid precautions.  Follow up in the AF clinic 12/14/19. Patient reports he is doing very well since his last visit. He has lost between 15-20 lbs by reducing his portion sizes. He has had two episodes of heart racing which resolved in <2 minutes.  He denies bleeding issues with anticoagulation.   Follow up in the AF clinic 04/19/20. Patient has been diagnosed with early stages of small cell lung cancer. Patient was admitted 04/03/20-04/06/20 for acute anemia requiring transfusion. Anemia appeared to be multifactorial with bone marrow suppression from chemo and GI bleeding. Upper and lower EGD were negative for active bleeding. Xarelto was resumed once platelets were >50,000. He reports that he has felt fatigued since discharge. ECG on 04/10/20 showed he was back out of rhythm.   Follow up in the AF clinic 05/09/20. Patient is s/p DCCV on 05/01/20. He feels much better with more energy. He denies any bleeding issues on anticoagulation. He starts his cancer treatments next week.   Follow up in the AF clinic 10/18/21. Patient reports that he has noted a higher heart rate in the past week. He stopped Multaq and doubled his BB on his own. On review of previous cardiac notes and ECGs, it appears he has been in afib since 08/2020. He is mostly asymptomatic but does not sleep well when his heart rate is elevated. Of note, he was diagnosed with lung cancer in the interim and is being treated by Dr Julien Nordmann.   Follow up in the AF clinic 11/06/21. Patient reports that since his last visit he has noted more lower  extremity edema and SOB. His weight is up as well. His heart rate has been staying between upper 80s to 100 bpm. No bleeding issues on anticoagulation. j  Today, he denies symptoms of palpitations, chest pain, orthopnea, PND, dizziness, presyncope, syncope, or neurologic sequela. The patient is tolerating medications without difficulties and is otherwise without complaint today.   Past Medical History:  Diagnosis Date   Atypical atrial flutter ([place])    Cancer (HCC)    Testicular- surgery    COPD (chronic obstructive pulmonary disease) (HCC)    Coronary artery disease    Diabetes mellitus without complication (HCC)    Diastolic dysfunction, left ventricle  05/2018   Dr. Rayann Heman- Cardiologist   Dysrhythmia    chronic AFib   History of kidney stones    passed   HLD (hyperlipidemia)    HTN (hypertension)    Myocardial infarction ([place])    x 2 maybe 1 more   OSA (obstructive sleep apnea)    uses CPAP   Peripheral artery disease ([place])    pseudoaneurysm post afib ablation at [facility] 2011, s/p bilateral iliac stents   Persistent atrial fibrillation (HCC)    Pre-diabetes    Renal artery stenosis ([place])    right renal artery PTA and stenting   S/P coronary artery bypass graft x 7 11/16/96   Past Surgical History:  Procedure Laterality Date   2-D echocardiogram  03/25/2010   Normal left ventricular function. Mild MR, TR, trivial AR   ABDOMINAL AORTOGRAM W/LOWER EXTREMITY Bilateral 10/08/2020   Procedure: ABDOMINAL AORTOGRAM W/LOWER EXTREMITY;  Surgeon: Lorretta Harp, MD;  Location: [place] [facility];  Service: Cardiovascular;  Laterality: Bilateral;   ATRIAL FIBRILLATION ABLATION  03/27/2010, 12/31/2010   [facility], Dr. Edwin Cap SURGERY  2004, 2007   Laminectomy, Discedectomy x 2   BRONCHIAL BIOPSY  12/27/2019   Procedure: BRONCHIAL BIOPSIES;  Surgeon: Garner Nash, DO;  Location: [facility];  Service: Pulmonary;;   BRONCHIAL BRUSHINGS  12/27/2019   Procedure: BRONCHIAL BRUSHINGS;  Surgeon: Garner Nash, DO;  Location: [place];  Service: Pulmonary;;   BRONCHIAL NEEDLE ASPIRATION BIOPSY  12/27/2019   Procedure: BRONCHIAL NEEDLE ASPIRATION BIOPSIES;  Surgeon: Garner Nash, DO;  Location: [facility];  Service: Pulmonary;;   cardiac stress test  11/21/2009   Exercise capacity 5 METS. No significant ischemia demonstrated   CARDIOVERSION N/A 03/15/2015   Procedure: CARDIOVERSION;  Surgeon: Lorretta Harp, MD;  Location: [place];  Service: Cardiovascular;  Laterality: N/A;   CARDIOVERSION N/A 07/10/2017   Procedure: CARDIOVERSION;  Surgeon: Pixie Casino, MD;  Location: [place];  Service: Cardiovascular;  Laterality:  N/A;   CARDIOVERSION N/A 03/23/2018   Procedure: CARDIOVERSION;  Surgeon: Sanda Klein, MD;  Location: [place];  Service: Cardiovascular;  Laterality: N/A;   CARDIOVERSION N/A 05/01/2020   Procedure: CARDIOVERSION;  Surgeon: Josue Hector, MD;  Location: [facility];  Service: Cardiovascular;  Laterality: N/A;   COLONOSCOPY WITH PROPOFOL N/A 04/05/2020   Procedure: COLONOSCOPY WITH PROPOFOL;  Surgeon: Carol Ada, MD;  Location: [facility];  Service: Endoscopy;  Laterality: N/A;   CORONARY ARTERY BYPASS GRAFT  1998   ELECTROMAGNETIC NAVIGATION BROCHOSCOPY  12/27/2019   Procedure: NAVIGATION BRONCHOSCOPY;  Surgeon: Garner Nash, DO;  Location: [place] [facility];  Service: Pulmonary;;   ESOPHAGOGASTRODUODENOSCOPY (EGD) WITH PROPOFOL N/A 04/05/2020   Procedure: ESOPHAGOGASTRODUODENOSCOPY (EGD) WITH PROPOFOL;  Surgeon: Carol Ada, MD;  Location: [facility];  Service: Endoscopy;  Laterality: N/A;   FIDUCIAL MARKER  PLACEMENT  12/27/2019   Procedure: FIDUCIAL MARKER PLACEMENT;  Surgeon: Garner Nash, DO;  Location: [facility];  Service: Pulmonary;;   HOT HEMOSTASIS N/A 04/05/2020   Procedure: HOT HEMOSTASIS (ARGON PLASMA COAGULATION/BICAP);  Surgeon: Carol Ada, MD;  Location: [facility];  Service: Endoscopy;  Laterality: N/A;   [place]   for cancer   POLYPECTOMY  04/05/2020   Procedure: POLYPECTOMY;  Surgeon: Carol Ada, MD;  Location: [facility];  Service: Endoscopy;;   TOOTH EXTRACTION  05/27/2013   tooth extraction with bone graft- several -for Implants   VIDEO BRONCHOSCOPY WITH ENDOBRONCHIAL NAVIGATION N/A 12/27/2019   Procedure: VIDEO BRONCHOSCOPY;  Surgeon: Garner Nash, DO;  Location: [place];  Service: Pulmonary;  Laterality: N/A;    Current Outpatient Medications  Medication Sig Dispense Refill   acetaminophen (TYLENOL) 500 MG tablet Take 1,000 mg by mouth every 6 (six) hours as needed for mild pain or moderate pain.     albuterol (VENTOLIN  HFA) 108 (90 Base) MCG/ACT inhaler Inhale 2 puffs into the lungs every 6 (six) hours as needed. 18 g 2   atorvastatin (LIPITOR) 10 MG tablet TAKE 1 TABLET(10 MG) BY MOUTH DAILY 90 tablet 1   ferrous sulfate 324 MG TBEC Take 324 mg by mouth.     gabapentin (NEURONTIN) 300 MG capsule TAKE 2 CAPSULES(600 MG) BY MOUTH AT BEDTIME 180 capsule 2   ketotifen (ZADITOR) 0.025 % ophthalmic solution Place 1 drop into both eyes 2 (two) times daily as needed (allergies).     metoprolol succinate (TOPROL-XL) 100 MG 24 hr tablet TAKE 1 TABLET BY MOUTH EVERY DAY 90 tablet 0   Multiple Vitamin (MULTIVITAMIN WITH MINERALS) TABS tablet Take 1 tablet by mouth daily. Centrum silver     niacin (NIASPAN) 1000 MG CR tablet TAKE 1 TABLET(1000 MG) BY MOUTH AT BEDTIME 90 tablet 1   nicotine polacrilex (COMMIT) 2 MG lozenge Take 2 mg by mouth daily as needed for smoking cessation.      nitroGLYCERIN (NITROSTAT) 0.4 MG SL tablet Place 1 tablet (0.4 mg total) under the tongue every 5 (five) minutes as needed. 25 tablet 3   NON FORMULARY CPAP at night     oxymetazoline (AFRIN) 0.05 % nasal spray Place 1 spray into both nostrils daily as needed for congestion.      prochlorperazine (COMPAZINE) 10 MG tablet TAKE 1 TABLET(10 MG) BY MOUTH EVERY 6 HOURS AS NEEDED FOR NAUSEA OR VOMITING 30 tablet 0   sertraline (ZOLOFT) 50 MG tablet TAKE 1 TABLET(50 MG) BY MOUTH DAILY 90 tablet 1   traMADol (ULTRAM) 50 MG tablet TAKE 1 TABLET(50 MG) BY MOUTH EVERY 12 HOURS AS NEEDED 30 tablet 3   traZODone (DESYREL) 50 MG tablet TAKE 1/2 TO 1 TABLET BY MOUTH AT BEDTIME AS NEEDED FOR SLEEP 90 tablet 3   XARELTO 20 MG TABS tablet TAKE 1 TABLET BY MOUTH EVERY DAY WITH SUPPER 90 tablet 1   Current Facility-Administered Medications  Medication Dose Route Frequency Provider Last Rate Last Admin   sodium chloride flush (NS) 0.9 % injection 3 mL  3 mL Intravenous Q12H Lorretta Harp, MD        No Known Allergies  Social History   Socioeconomic  History   Marital status: Divorced    Spouse name: Not on file   Number of children: 2   Years of education: 16   Highest education level: Associate degree: academic program  Occupational History   Occupation: retired  Comment: Manger of [employer]  Tobacco Use   Smoking status: Former    Packs/day: 1.50    Years: 30.00    Pack years: 45.00    Types: Cigarettes, Cigars    Quit date: 09/01/1994    Years since quitting: 27.2   Smokeless tobacco: Never   Tobacco comments:    Former smoker cigars quit  - 11/16/2019  Vaping Use   Vaping Use: Never used  Substance and Sexual Activity   Alcohol use: Yes    Alcohol/week: 2.0 standard drinks    Types: 2 Standard drinks or equivalent per week    Comment: 2 mixed drinks every 2 weeks 11/06/21   Drug use: No   Sexual activity: Yes  Other Topics Concern   Not on file  Social History Narrative   Pt lives in [place] alone.  Divorced. 2 cups of coffee in the morning.   Retired from [employer])   Social Determinants of Health   Financial Resource Strain: Not on file  Food Insecurity: Not on file  Transportation Needs: Not on file  Physical Activity: Not on file  Stress: Not on file  Social Connections: Not on file  Intimate Partner Violence: Not on file    Family History  Problem Relation Age of Onset   Cancer Mother    Cancer Father    Other Brother        NO MEDICAL PROBLEMS   Other Son        NO MEDICAL PROBLEMS   Leukemia Daughter 6       Recover and has no other problems    ROS- All systems are reviewed and negative except as per the HPI above  Physical Exam: Vitals:   11/06/21 1102  BP: 102/76  Pulse: 97  Weight: 94.1 kg  Height: 6' (1.829 m)     Wt Readings from Last 3 Encounters:  11/06/21 94.1 kg  10/18/21 89.8 kg  08/06/21 89.5 kg    Labs: Lab Results  Component Value Date   NA 139 07/24/2021   K 3.8 07/24/2021   CL 108 07/24/2021   CO2 24 07/24/2021   GLUCOSE 117 (H)  07/24/2021   BUN 10 07/24/2021   CREATININE 0.84 07/24/2021   CALCIUM 7.9 (L) 07/24/2021   Lab Results  Component Value Date   INR 1.1 12/27/2019   Lab Results  Component Value Date   CHOL 97 08/20/2020   HDL 53 08/20/2020   LDLCALC 28 08/20/2020   TRIG 77 08/20/2020    GEN- The patient is a well appearing male, alert and oriented x 3 today.   HEENT-head normocephalic, atraumatic, sclera clear, conjunctiva pink, hearing intact, trachea midline. Lungs- Clear to ausculation bilaterally, normal work of breathing Heart- Regular rate and rhythm, no murmurs, rubs or gallops  GI- soft, NT, ND, + BS Extremities- no clubbing, cyanosis, 1-2+ bilateral edema MS- no significant deformity or atrophy Skin- no rash or lesion Psych- euthymic mood, full affect Neuro- strength and sensation are intact   EKG-  Accelerated junctional rhythm vs atypical atrial flutter Vent. rate 97 BPM PR interval * ms QRS duration 136 ms QT/QTcB 412/523 ms  Echo 04/24/20  1. Left ventricular ejection fraction, by estimation, is 40%. The left  ventricle has mildly decreased function. The left ventricle demonstrates  global hypokinesis. The left ventricular internal cavity size was mildly  dilated. Left ventricular diastolic parameters are indeterminate.   2. Right ventricular systolic function is mildly reduced. The right  ventricular size is normal. There is normal pulmonary artery systolic  pressure. The estimated right ventricular systolic pressure is 87.5 mmHg.   3. Right atrial size was mildly dilated.   4. The mitral valve is normal in structure. No evidence of mitral valve  regurgitation. No evidence of mitral stenosis.   5. The aortic valve is tricuspid. Aortic valve regurgitation is not  visualized. No aortic stenosis is present.   6. The inferior vena cava is dilated in size with >50% respiratory  variability, suggesting right atrial pressure of 8 mmHg.   7. The patient appeared to be in atrial  flutter.   Epic records reviewed   Assessment and Plan: 1.  Longstanding persistent atrial fibrillation/atypical atrial flutter S[facility] at [facility]. Patient had DCCV 05/01/20 but was back in afib at follow up 08/2020. Failed Multaq, QT is marginal for sotalol or dofetilide, failed amiodarone 2/2 elevated LFTs and abnormal PFTs.  Stop diltiazem given fluid retention.  Will have patient wear Zio monitor to evaluate rate control.  Continue Xarelto 20 mg daily Continue Toprol 100 mg daily  This patients CHA2DS2-VASc Score and unadjusted Ischemic Stroke Rate (% per year) is equal to 4.8 % stroke rate/year from a score of 4  Above score calculated as 1 point each if present [CHF, HTN, DM, Vascular=MI/PAD/Aortic Plaque, Age if 65-74, or Male] Above score calculated as 2 points each if present [Age > 75, or Stroke/TIA/TE]  2. HTN Stable, med changes as above.   3. CAD/ischemic CM S/p CABG 1998. No anginal symptoms. Patient to take his PRN Lasix for the next 3 days.   4. OSA Followed by Dr Claiborne Billings.  5. PAD External iliac artery stents bilaterally Followed by Dr Gwenlyn Found.   Follow up with Dr Gwenlyn Found as scheduled. AF clinic in 2 months.    [facility] [address] [place], [place] [address] [phone]

## 2021-11-10 ENCOUNTER — Other Ambulatory Visit (HOSPITAL_COMMUNITY): Payer: Self-pay | Admitting: Cardiovascular Disease

## 2021-11-19 DIAGNOSIS — H43391 Other vitreous opacities, right eye: Secondary | ICD-10-CM | POA: Diagnosis not present

## 2021-11-19 DIAGNOSIS — H18509 Unspecified hereditary corneal dystrophies, unspecified eye: Secondary | ICD-10-CM | POA: Diagnosis not present

## 2021-11-19 DIAGNOSIS — H43811 Vitreous degeneration, right eye: Secondary | ICD-10-CM | POA: Diagnosis not present

## 2021-11-19 DIAGNOSIS — H25013 Cortical age-related cataract, bilateral: Secondary | ICD-10-CM | POA: Diagnosis not present

## 2021-11-22 ENCOUNTER — Encounter (HOSPITAL_COMMUNITY): Payer: Self-pay | Admitting: *Deleted

## 2021-11-27 ENCOUNTER — Ambulatory Visit: Payer: Medicare Other | Admitting: Cardiovascular Disease

## 2021-11-28 ENCOUNTER — Telehealth: Payer: Self-pay

## 2021-11-28 NOTE — Telephone Encounter (Signed)
Initiated Prior authorization OEU:MPNTIRWE (ULTRAM) 50 MG tablet ?Via: Covermymeds ?Case/Key: [id number] ?Status: approved  as of 11/28/21 ?Reason:Effective from 11/28/2021 through 11/29/2022 ?Notified Pt via: Mychart ?

## 2021-12-04 ENCOUNTER — Telehealth: Payer: Self-pay | Admitting: Family Medicine

## 2021-12-04 NOTE — Telephone Encounter (Signed)
Received a call from Joie Bimler (friend) [phone] that the patient passed away on 2022-12-10.  ?

## 2021-12-30 DEATH — deceased

## 2022-01-01 ENCOUNTER — Ambulatory Visit: Payer: Medicare Other | Admitting: Family Medicine

## 2022-01-08 ENCOUNTER — Ambulatory Visit (HOSPITAL_COMMUNITY): Payer: Medicare Other | Admitting: Physician Assistant

## 2022-01-17 ENCOUNTER — Other Ambulatory Visit: Payer: Medicare Other

## 2022-01-22 ENCOUNTER — Ambulatory Visit: Payer: Medicare Other | Admitting: Internal Medicine

## 2022-02-05 ENCOUNTER — Ambulatory Visit: Payer: Medicare Other | Admitting: Cardiovascular Disease

## 2022-03-17 ENCOUNTER — Other Ambulatory Visit: Payer: Self-pay | Admitting: Cardiovascular Disease

## 2022-05-21 ENCOUNTER — Telehealth: Payer: Medicare Other
